# Patient Record
Sex: Male | Born: 1935 | Race: White | Hispanic: No | Smoking: Former smoker
Health system: Southern US, Community
[De-identification: ages and names within clinical notes are randomized; demographics above are authoritative.]

## PROBLEM LIST (undated history)

## (undated) DIAGNOSIS — I499 Cardiac arrhythmia, unspecified: Secondary | ICD-10-CM

## (undated) DIAGNOSIS — I214 Non-ST elevation (NSTEMI) myocardial infarction: Secondary | ICD-10-CM

## (undated) DIAGNOSIS — Z95820 Peripheral vascular angioplasty status with implants and grafts: Secondary | ICD-10-CM

## (undated) DIAGNOSIS — I2510 Atherosclerotic heart disease of native coronary artery without angina pectoris: Secondary | ICD-10-CM

## (undated) DIAGNOSIS — R011 Cardiac murmur, unspecified: Secondary | ICD-10-CM

## (undated) DIAGNOSIS — K219 Gastro-esophageal reflux disease without esophagitis: Secondary | ICD-10-CM

## (undated) DIAGNOSIS — C61 Malignant neoplasm of prostate: Secondary | ICD-10-CM

## (undated) DIAGNOSIS — I10 Essential (primary) hypertension: Secondary | ICD-10-CM

## (undated) DIAGNOSIS — I209 Angina pectoris, unspecified: Secondary | ICD-10-CM

## (undated) DIAGNOSIS — N189 Chronic kidney disease, unspecified: Secondary | ICD-10-CM

## (undated) HISTORY — PX: CATARACT EXTRACTION W/ INTRAOCULAR LENS  IMPLANT, BILATERAL: SHX1307

## (undated) HISTORY — DX: Cardiac arrhythmia, unspecified: I49.9

## (undated) HISTORY — PX: STAPEDECTOMY: SHX2435

## (undated) HISTORY — PX: CARDIAC CATHETERIZATION: SHX172

---

## 1999-09-18 HISTORY — PX: INSERTION PROSTATE RADIATION SEED: SUR718

## 2011-11-23 ENCOUNTER — Encounter (HOSPITAL_COMMUNITY): Payer: Self-pay | Admitting: Emergency Medicine

## 2011-11-23 ENCOUNTER — Emergency Department (HOSPITAL_COMMUNITY): Payer: MEDICARE

## 2011-11-23 ENCOUNTER — Inpatient Hospital Stay (HOSPITAL_COMMUNITY)
Admission: EM | Admit: 2011-11-23 | Discharge: 2011-11-28 | DRG: 249 | Disposition: A | Discharge status: Home / Self Care | Payer: MEDICARE | Source: Ambulatory Visit | Attending: Cardiology | Admitting: Cardiology

## 2011-11-23 ENCOUNTER — Other Ambulatory Visit: Payer: Self-pay

## 2011-11-23 DIAGNOSIS — E861 Hypovolemia: Secondary | ICD-10-CM | POA: Diagnosis present

## 2011-11-23 DIAGNOSIS — R079 Chest pain, unspecified: Secondary | ICD-10-CM | POA: Diagnosis present

## 2011-11-23 DIAGNOSIS — E876 Hypokalemia: Secondary | ICD-10-CM | POA: Diagnosis not present

## 2011-11-23 DIAGNOSIS — Z7982 Long term (current) use of aspirin: Secondary | ICD-10-CM

## 2011-11-23 DIAGNOSIS — I2510 Atherosclerotic heart disease of native coronary artery without angina pectoris: Secondary | ICD-10-CM | POA: Diagnosis present

## 2011-11-23 DIAGNOSIS — I4891 Unspecified atrial fibrillation: Secondary | ICD-10-CM | POA: Diagnosis present

## 2011-11-23 DIAGNOSIS — Z79899 Other long term (current) drug therapy: Secondary | ICD-10-CM

## 2011-11-23 DIAGNOSIS — Z95820 Peripheral vascular angioplasty status with implants and grafts: Secondary | ICD-10-CM

## 2011-11-23 DIAGNOSIS — I10 Essential (primary) hypertension: Secondary | ICD-10-CM | POA: Diagnosis present

## 2011-11-23 DIAGNOSIS — I214 Non-ST elevation (NSTEMI) myocardial infarction: Principal | ICD-10-CM | POA: Diagnosis present

## 2011-11-23 DIAGNOSIS — I129 Hypertensive chronic kidney disease with stage 1 through stage 4 chronic kidney disease, or unspecified chronic kidney disease: Secondary | ICD-10-CM | POA: Diagnosis present

## 2011-11-23 DIAGNOSIS — N179 Acute kidney failure, unspecified: Secondary | ICD-10-CM | POA: Diagnosis present

## 2011-11-23 DIAGNOSIS — D696 Thrombocytopenia, unspecified: Secondary | ICD-10-CM | POA: Diagnosis not present

## 2011-11-23 DIAGNOSIS — N183 Chronic kidney disease, stage 3 unspecified: Secondary | ICD-10-CM | POA: Diagnosis present

## 2011-11-23 DIAGNOSIS — K219 Gastro-esophageal reflux disease without esophagitis: Secondary | ICD-10-CM | POA: Diagnosis present

## 2011-11-23 DIAGNOSIS — I4510 Unspecified right bundle-branch block: Secondary | ICD-10-CM | POA: Diagnosis present

## 2011-11-23 HISTORY — DX: Essential (primary) hypertension: I10

## 2011-11-23 HISTORY — DX: Gastro-esophageal reflux disease without esophagitis: K21.9

## 2011-11-23 HISTORY — DX: Atherosclerotic heart disease of native coronary artery without angina pectoris: I25.10

## 2011-11-23 HISTORY — DX: Malignant neoplasm of prostate: C61

## 2011-11-23 HISTORY — DX: Angina pectoris, unspecified: I20.9

## 2011-11-23 HISTORY — DX: Non-ST elevation (NSTEMI) myocardial infarction: I21.4

## 2011-11-23 HISTORY — DX: Peripheral vascular angioplasty status with implants and grafts: Z95.820

## 2011-11-23 HISTORY — DX: Cardiac murmur, unspecified: R01.1

## 2011-11-23 LAB — CARDIAC PANEL(CRET KIN+CKTOT+MB+TROPI)
CK, MB: 7 ng/mL (ref 0.3–4.0)
Relative Index: 2.9 — ABNORMAL HIGH (ref 0.0–2.5)
Total CK: 245 U/L — ABNORMAL HIGH (ref 7–232)

## 2011-11-23 LAB — URINE MICROSCOPIC-ADD ON

## 2011-11-23 LAB — URINALYSIS, ROUTINE W REFLEX MICROSCOPIC
Bilirubin Urine: NEGATIVE
Glucose, UA: NEGATIVE mg/dL
Hgb urine dipstick: NEGATIVE
Ketones, ur: NEGATIVE mg/dL
Leukocytes, UA: NEGATIVE
Nitrite: NEGATIVE
Protein, ur: 30 mg/dL — AB
Specific Gravity, Urine: 1.025 (ref 1.005–1.030)
Urobilinogen, UA: 0.2 mg/dL (ref 0.0–1.0)
pH: 5.5 (ref 5.0–8.0)

## 2011-11-23 LAB — CBC
HCT: 45.6 % (ref 39.0–52.0)
Hemoglobin: 16.4 g/dL (ref 13.0–17.0)
MCH: 31.7 pg (ref 26.0–34.0)
MCHC: 36 g/dL (ref 30.0–36.0)
MCV: 88.2 fL (ref 78.0–100.0)
Platelets: 176 10*3/uL (ref 150–400)
RBC: 5.17 MIL/uL (ref 4.22–5.81)
RDW: 13.6 % (ref 11.5–15.5)
WBC: 13.5 10*3/uL — ABNORMAL HIGH (ref 4.0–10.5)

## 2011-11-23 LAB — BASIC METABOLIC PANEL
BUN: 42 mg/dL — ABNORMAL HIGH (ref 6–23)
CO2: 27 mEq/L (ref 19–32)
Calcium: 12.2 mg/dL — ABNORMAL HIGH (ref 8.4–10.5)
Chloride: 99 mEq/L (ref 96–112)
Creatinine, Ser: 1.93 mg/dL — ABNORMAL HIGH (ref 0.50–1.35)
GFR calc Af Amer: 37 mL/min — ABNORMAL LOW (ref >90)
GFR calc non Af Amer: 32 mL/min — ABNORMAL LOW (ref >90)
Glucose, Bld: 107 mg/dL — ABNORMAL HIGH (ref 70–99)
Potassium: 3.5 mEq/L (ref 3.5–5.1)
Sodium: 139 mEq/L (ref 135–145)

## 2011-11-23 LAB — APTT: aPTT: 22 seconds — ABNORMAL LOW (ref 24–37)

## 2011-11-23 LAB — TROPONIN I: Troponin I: 3.01 ng/mL (ref <0.30)

## 2011-11-23 LAB — PROTIME-INR: INR: 1.17 (ref 0.00–1.49)

## 2011-11-23 IMAGING — CR DG CHEST 1V PORT
1 series · 1 of 1 positions shown · non-contrast
Comparison: No priors.

CLINICAL DATA: Chest pain and irregular heart beat.

PORTABLE CHEST - 1 VIEW

[view not recorded]
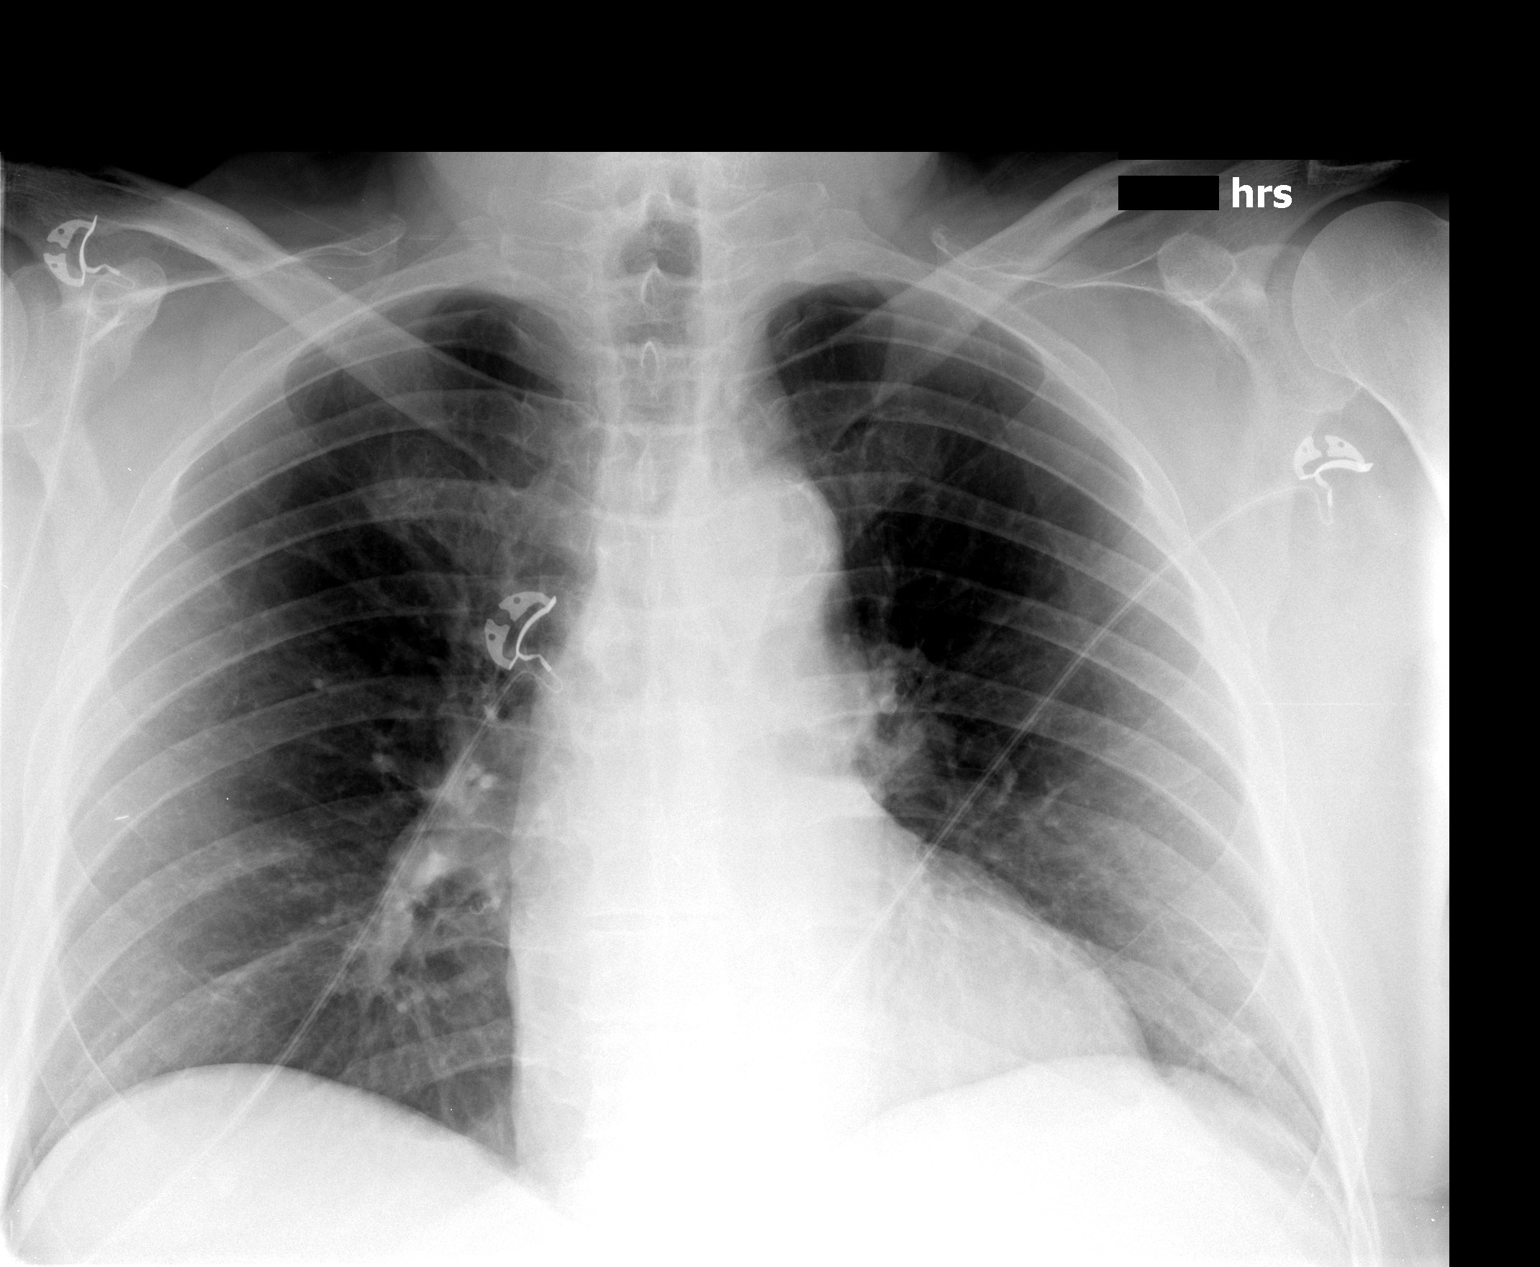

[1 of 1 positions shown; findings below may reference images not displayed]

FINDINGS: Lung volumes are normal.  No consolidative airspace
disease.  No pleural effusions.  No pneumothorax.  No pulmonary
nodule or mass noted.  Linear opacity in the lower left hemithorax
laterally is most consistent with an tiny focus of subsegmental
atelectasis and/or scarring.  Pulmonary vasculature and the
cardiomediastinal silhouette are within normal limits.
Atherosclerotic calcifications within the arch of the aorta.
IMPRESSION: 1. No radiographic evidence of acute cardiopulmonary disease.
2.  Atherosclerosis.

## 2011-11-23 MED ORDER — LOSARTAN POTASSIUM 50 MG PO TABS
100.0000 mg | ORAL_TABLET | Freq: Every day | ORAL | Status: DC
  Administered 2011-11-23 – 2011-11-24 (×2): 100 mg via ORAL
  Filled 2011-11-23 (×2): qty 2

## 2011-11-23 MED ORDER — HYDROCHLOROTHIAZIDE 25 MG PO TABS
25.0000 mg | ORAL_TABLET | Freq: Every day | ORAL | Status: DC
  Administered 2011-11-24: 25 mg via ORAL
  Filled 2011-11-23: qty 1

## 2011-11-23 MED ORDER — PANTOPRAZOLE SODIUM 40 MG PO TBEC
40.0000 mg | DELAYED_RELEASE_TABLET | Freq: Every day | ORAL | Status: DC
  Administered 2011-11-24 – 2011-11-27 (×4): 40 mg via ORAL
  Filled 2011-11-23 (×3): qty 1

## 2011-11-23 MED ORDER — HEPARIN BOLUS VIA INFUSION
4000.0000 [IU] | Freq: Once | INTRAVENOUS | Status: AC
  Administered 2011-11-23: 4000 [IU] via INTRAVENOUS

## 2011-11-23 MED ORDER — ATORVASTATIN CALCIUM 10 MG PO TABS
10.0000 mg | ORAL_TABLET | Freq: Every day | ORAL | Status: DC
  Administered 2011-11-23 – 2011-11-27 (×5): 10 mg via ORAL
  Filled 2011-11-23 (×6): qty 1

## 2011-11-23 MED ORDER — ONDANSETRON HCL 4 MG/2ML IJ SOLN: 4.0000 mg | Freq: Four times a day (QID) | INTRAMUSCULAR | Status: DC | PRN

## 2011-11-23 MED ORDER — SODIUM CHLORIDE 0.9 % IV SOLN: INTRAVENOUS | Status: AC

## 2011-11-23 MED ORDER — LISINOPRIL-HYDROCHLOROTHIAZIDE 20-25 MG PO TABS: 1.0000 | ORAL_TABLET | Freq: Every day | ORAL | Status: DC

## 2011-11-23 MED ORDER — LISINOPRIL 20 MG PO TABS
20.0000 mg | ORAL_TABLET | Freq: Every day | ORAL | Status: DC
  Administered 2011-11-24: 20 mg via ORAL
  Filled 2011-11-23: qty 1

## 2011-11-23 MED ORDER — HEPARIN (PORCINE) IN NACL 100-0.45 UNIT/ML-% IJ SOLN
1650.0000 [IU]/h | INTRAMUSCULAR | Status: DC
  Administered 2011-11-23 – 2011-11-25 (×4): 1300 [IU]/h via INTRAVENOUS
  Administered 2011-11-26: 1650 [IU]/h via INTRAVENOUS
  Filled 2011-11-23 (×3): qty 250

## 2011-11-23 MED ORDER — ASPIRIN EC 325 MG PO TBEC
325.0000 mg | DELAYED_RELEASE_TABLET | Freq: Every day | ORAL | Status: DC
  Administered 2011-11-24 – 2011-11-25 (×2): 325 mg via ORAL
  Filled 2011-11-23 (×3): qty 1

## 2011-11-23 MED ORDER — ASPIRIN EC 81 MG PO TBEC
81.0000 mg | DELAYED_RELEASE_TABLET | Freq: Every day | ORAL | Status: DC
  Filled 2011-11-23: qty 1

## 2011-11-23 MED ORDER — ISOSORBIDE MONONITRATE 15 MG HALF TABLET
15.0000 mg | ORAL_TABLET | Freq: Every day | ORAL | Status: DC
  Administered 2011-11-23 – 2011-11-28 (×6): 15 mg via ORAL
  Filled 2011-11-23 (×6): qty 1

## 2011-11-23 MED ORDER — NITROGLYCERIN 0.4 MG SL SUBL: 0.4000 mg | SUBLINGUAL_TABLET | SUBLINGUAL | Status: DC | PRN

## 2011-11-23 MED ORDER — HEPARIN (PORCINE) IN NACL 100-0.45 UNIT/ML-% IJ SOLN
1250.0000 [IU]/h | INTRAMUSCULAR | Status: DC
  Administered 2011-11-23: 1250 [IU]/h via INTRAVENOUS
  Filled 2011-11-23 (×2): qty 250

## 2011-11-23 MED ORDER — DILTIAZEM HCL 100 MG IV SOLR
5.0000 mg/h | INTRAVENOUS | Status: DC
  Administered 2011-11-24 – 2011-11-25 (×2): 5 mg/h via INTRAVENOUS
  Filled 2011-11-23 (×4): qty 100

## 2011-11-23 MED ORDER — ACETAMINOPHEN 325 MG PO TABS
650.0000 mg | ORAL_TABLET | ORAL | Status: DC | PRN
  Administered 2011-11-23: 650 mg via ORAL
  Filled 2011-11-23: qty 2

## 2011-11-23 MED ORDER — ASPIRIN EC 81 MG PO TBEC: 81.0000 mg | DELAYED_RELEASE_TABLET | Freq: Every day | ORAL | Status: DC

## 2011-11-23 MED ORDER — METOPROLOL TARTRATE 12.5 MG HALF TABLET
12.5000 mg | ORAL_TABLET | Freq: Two times a day (BID) | ORAL | Status: DC
  Administered 2011-11-23: 12.5 mg via ORAL
  Filled 2011-11-23 (×3): qty 1

## 2011-11-23 MED ORDER — METOPROLOL SUCCINATE ER 50 MG PO TB24
50.0000 mg | ORAL_TABLET | Freq: Every day | ORAL | Status: DC
  Administered 2011-11-24: 50 mg via ORAL
  Filled 2011-11-23 (×2): qty 1

## 2011-11-23 NOTE — ED Notes (Signed)
Reported troponin 3.01 Gillian Scarce, [place]

## 2011-11-23 NOTE — ED Notes (Signed)
2006-01 Ready 

## 2011-11-23 NOTE — ED Provider Notes (Signed)
History     CSN: [id number]  Arrival date & time 11/23/11  1113   First MD Initiated Contact with Patient 11/23/11 1147      Chief Complaint  Patient presents with  . Irregular Heart Beat    (Consider location/radiation/quality/duration/timing/severity/associated sxs/prior treatment) HPI Patient presents the emergency department after presenting to urgent care this morning following chest pain that he states started while he was in [place].  Patient states that he is having this burning sensation across the anterior portion of his chest.  Patient states he is having some shortness of breath with this as well.  He states that last 3 or 4 days and went away.  He is not having any current pain at this time.  He stated that he did have some mild nausea over that time frame as well.  Patient denies abdominal pain, fever, headache, visual changes, back pain, weakness, numbness, or diarrhea. Past Medical History  Diagnosis Date  . Acid reflux   . Hypertension     History reviewed. No pertinent past surgical history.  History reviewed. No pertinent family history.  History  Substance Use Topics  . Smoking status: Never Smoker   . Smokeless tobacco: Not on file  . Alcohol Use: No      Review of Systems All pertinent positives and negatives reviewed in the history of present illness  Allergies  Review of patient's allergies indicates no known allergies.  Home Medications   Current Outpatient Rx  Name Route Sig Dispense Refill  . ASPIRIN EC 81 MG PO TBEC Oral Take 81 mg by mouth daily.    Marland Kitchen LISINOPRIL-HYDROCHLOROTHIAZIDE 20-25 MG PO TABS Oral Take 1 tablet by mouth daily.    Marland Kitchen LOSARTAN POTASSIUM 100 MG PO TABS Oral Take 100 mg by mouth daily.    Marland Kitchen METOPROLOL SUCCINATE ER 50 MG PO TB24 Oral Take 50 mg by mouth daily. Take with or immediately following a meal.    . OMEPRAZOLE 20 MG PO CPDR Oral Take 20 mg by mouth daily.      BP 117/88  Pulse 89  Temp(Src) 98.3 F (36.8  C) (Oral)  Resp 15  Ht 6' (1.829 m)  Wt 220 lb (99.791 kg)  BMI 29.84 kg/m2  SpO2 96%  Physical Exam  Constitutional: He is oriented to person, place, and time. He appears well-developed and well-nourished. No distress.  HENT:  Head: Normocephalic and atraumatic.  Eyes: Pupils are equal, round, and reactive to light.  Cardiovascular: Normal heart sounds.  An irregularly irregular rhythm present. Exam reveals no gallop and no friction rub.   No murmur heard. Pulmonary/Chest: Effort normal and breath sounds normal. No respiratory distress. He has no wheezes. He has no rales.  Abdominal: Soft. Bowel sounds are normal. He exhibits no distension. There is no tenderness. There is no rebound and no guarding.  Neurological: He is alert and oriented to person, place, and time.  Skin: Skin is warm and dry. No rash noted. He is not diaphoretic.  Psychiatric: He has a normal mood and affect. His behavior is normal. Thought content normal.    ED Course  Procedures (including critical care time)  Labs Reviewed  BASIC METABOLIC PANEL - Abnormal; Notable for the following:    Glucose, Bld 107 (*)    BUN 42 (*)    Creatinine, Ser 1.93 (*)    Calcium 12.2 (*)    GFR calc non Af Amer 32 (*)    GFR calc Af Amer 37 (*)  All other components within normal limits  CBC - Abnormal; Notable for the following:    WBC 13.5 (*)    All other components within normal limits  TROPONIN I - Abnormal; Notable for the following:    Troponin I 3.01 (*)    All other components within normal limits  URINALYSIS, ROUTINE W REFLEX MICROSCOPIC  PROTIME-INR  APTT  HEPARIN LEVEL (UNFRACTIONATED)   No results found.   1. Chest pain    Patient admitted to the hospital for this chest pain and for further workup by [facility] heart and vascular   MDM   Date: 11/23/2011  Rate:118  Rhythm: atrial fibrillation  QRS Axis: normal  Intervals: normal  ST/T Wave abnormalities: nonspecific T wave changes   Conduction Disutrbances:right bundle branch block  Narrative Interpretation:   Old EKG Reviewed: none available   MDM Reviewed: nursing note and vitals Interpretation: labs, ECG and x-ray Consults: cardiology             Carlyle Dolly, PA-C 11/23/11 1422  Carlyle Dolly, PA-C 11/23/11 1423

## 2011-11-23 NOTE — Progress Notes (Signed)
ANTICOAGULATION CONSULT NOTE - Initial Consult  Pharmacy Consult for Heparin  Indication: atrial fibrillation and chest pain  No Known Allergies  Patient Measurements: Height: 6' (182.9 cm) Weight: 220 lb (99.791 kg) IBW/kg (Calculated) : 77.6  Heparin Dosing Weight: 98 kg  Vital Signs: Temp: 98.3 F (36.8 C) (03/08 1123) Temp src: Oral (03/08 1123) BP: 117/88 mmHg (03/08 1305) Pulse Rate: 89  (03/08 1305)  Labs:  Basename 11/23/11 1157  HGB 16.4  HCT 45.6  PLT 176  APTT --  LABPROT --  INR --  HEPARINUNFRC --  CREATININE 1.93*  CKTOTAL --  CKMB --  TROPONINI 3.01*   Estimated Creatinine Clearance: 39.8 ml/min (by C-G formula based on Cr of 1.93).  Medical History: Past Medical History  Diagnosis Date  . Acid reflux   . Hypertension    Assessment: 76 YOM presented with new onset afib and chest pain to start IV heparin. Patient is not on anticoagulants prior to admission. No known drug allergies per Pt.  Baseline hgb and plt wnl, troponin elevated 3.01.   Goal of Therapy:  Heparin level 0.3-0.7 units/ml   Plan:  - Start heparin bolus 4000 units, then heparin infusion 1250 units/hr - f/u heparin level 8 hours after infusion start - Daily CBC and heparin level with AM labs  Riki Rusk 11/23/2011,1:28 PM

## 2011-11-23 NOTE — H&P (Signed)
[name] [name] is an 78 y.o. male.   Chief Complaint: Atrial fibrillation, chest pain HPI: patient is a 78 year old Caucasian male who lives 3 months of the year in the [place], one month in the [place] area, the rest of the time in [place]. He has a history of hypertension gastroesophageal reflux disease and prostate surgery approximately 10 years ago. Patient states he was in the [place] on Monday when he developed a "hot, burning pressure-like" sensation in his chest.  Had associated belching, dyspnea on exertion. He states the pressure at its worst was a 7/10 in intensity.  Is currently resolved. The patient presented to urgent care today for follow up of his chest pain EKG was completed and showed it to fibrillation with RVR.  He was subsequently sent to the most the hospital for further evaluation.  He denies shortness of breath, orthopnea, PND, abdominal pain.  Past Medical History  Diagnosis Date  . Acid reflux   . Hypertension     History reviewed. No pertinent past surgical history.  History reviewed. No pertinent family history. Social History:  reports that he has never smoked. He does not have any smokeless tobacco history on file. He reports that he does not drink alcohol or use illicit drugs.  Medications: Cozaar 100 mg daily, white central HCTZ 10/25 mg daily, Toprol-XL 50 mg daily, Prilosec 20 mg daily, and vitamin E., vitamin C, aspirin 81 mg daily, Lumigan eyedrops one drop each eye daily  Allergies: No Known Allergies  No current facility-administered medications on file as of 11/23/2011.   No current outpatient prescriptions on file as of 11/23/2011.   Results for orders placed during the hospital encounter of 11/23/11 (from the past 24 hour(s))  BASIC METABOLIC PANEL     Status: Abnormal   Collection Time   11/23/11 11:57 AM      Component Value Range   Sodium 139  135 - 145 (mEq/L)   Potassium 3.5  3.5 - 5.1 (mEq/L)   Chloride 99  96 - 112 (mEq/L)   CO2 27   19 - 32 (mEq/L)   Glucose, Bld 107 (*) 70 - 99 (mg/dL)   BUN 42 (*) 6 - 23 (mg/dL)   Creatinine, Ser 1.61 (*) 0.50 - 1.35 (mg/dL)   Calcium 09.6 (*) 8.4 - 10.5 (mg/dL)   GFR calc non Af Amer 32 (*) >90 (mL/min)   GFR calc Af Amer 37 (*) >90 (mL/min)  CBC     Status: Abnormal   Collection Time   11/23/11 11:57 AM      Component Value Range   WBC 13.5 (*) 4.0 - 10.5 (K/uL)   RBC 5.17  4.22 - 5.81 (MIL/uL)   Hemoglobin 16.4  13.0 - 17.0 (g/dL)   HCT 04.5  40.9 - 81.1 (%)   MCV 88.2  78.0 - 100.0 (fL)   MCH 31.7  26.0 - 34.0 (pg)   MCHC 36.0  30.0 - 36.0 (g/dL)   RDW 91.4  78.2 - 95.6 (%)   Platelets 176  150 - 400 (K/uL)  TROPONIN I     Status: Abnormal   Collection Time   11/23/11 11:57 AM      Component Value Range   Troponin I 3.01 (*) <0.30 (ng/mL)  PROTIME-INR     Status: Normal   Collection Time   11/23/11  1:26 PM      Component Value Range   Prothrombin Time 15.1  11.6 - 15.2 (seconds)   INR 1.17  0.00 - 1.49   APTT     Status: Abnormal   Collection Time   11/23/11  1:26 PM      Component Value Range   aPTT 22 (*) 24 - 37 (seconds)  URINALYSIS, ROUTINE W REFLEX MICROSCOPIC     Status: Abnormal   Collection Time   11/23/11  1:35 PM      Component Value Range   Color, Urine AMBER (*) YELLOW    APPearance CLOUDY (*) CLEAR    Specific Gravity, Urine 1.025  1.005 - 1.030    pH 5.5  5.0 - 8.0    Glucose, UA NEGATIVE  NEGATIVE (mg/dL)   Hgb urine dipstick NEGATIVE  NEGATIVE    Bilirubin Urine NEGATIVE  NEGATIVE    Ketones, ur NEGATIVE  NEGATIVE (mg/dL)   Protein, ur 30 (*) NEGATIVE (mg/dL)   Urobilinogen, UA 0.2  0.0 - 1.0 (mg/dL)   Nitrite NEGATIVE  NEGATIVE    Leukocytes, UA NEGATIVE  NEGATIVE   URINE MICROSCOPIC-ADD ON     Status: Normal   Collection Time   11/23/11  1:35 PM      Component Value Range   Squamous Epithelial / LPF RARE  RARE    WBC, UA 0-2  <3 (WBC/hpf)   Bacteria, UA RARE  RARE    Urine-Other MUCOUS PRESENT     Results for orders placed during the  hospital encounter of 11/23/11 (from the past 48 hour(s))  CBC     Status: Abnormal   Collection Time   11/23/11 11:57 AM      Component Value Range Comment   WBC 13.5 (*) 4.0 - 10.5 (K/uL)    RBC 5.17  4.22 - 5.81 (MIL/uL)    Hemoglobin 16.4  13.0 - 17.0 (g/dL)    HCT 16.1  09.6 - 04.5 (%)    MCV 88.2  78.0 - 100.0 (fL)    MCH 31.7  26.0 - 34.0 (pg)    MCHC 36.0  30.0 - 36.0 (g/dL)    RDW 40.9  81.1 - 91.4 (%)    Platelets 176  150 - 400 (K/uL)    No results found.  Review of Systems  Constitutional: Negative for fever and diaphoresis.  HENT: Positive for sore throat (mild). Negative for congestion.   Eyes: Negative for blurred vision and double vision.  Respiratory: Positive for shortness of breath (With exertion.). Negative for cough.   Cardiovascular: Positive for chest pain ("Pressure/burning" last Monday.) and leg swelling (Occassionally in theleft leg.  Prior injury). Negative for palpitations, orthopnea, claudication and PND.  Gastrointestinal: Negative for nausea, vomiting, abdominal pain, diarrhea, constipation, blood in stool and melena.  Genitourinary: Negative for dysuria and hematuria.  Neurological: Positive for weakness. Negative for dizziness and headaches.    Blood pressure 153/106, pulse 95, temperature 98.3 F (36.8 C), temperature source Oral, resp. rate 19, SpO2 100.00%. Physical Exam  Constitutional: He is oriented to person, place, and time. He appears well-developed and well-nourished. No distress.  HENT:  Head: Normocephalic and atraumatic.  Eyes: EOM are normal. Pupils are equal, round, and reactive to light. Left eye exhibits no discharge. No scleral icterus.  Neck: Normal range of motion. Neck supple. No JVD present.  Cardiovascular: An irregularly irregular rhythm present.  Murmur heard.  Systolic murmur is present with a grade of 1/6       No carotid or femoral bruits.  Heart rate elevated  Respiratory: Effort normal and breath sounds normal. He  has no wheezes. He  has no rales.  GI: Soft. Bowel sounds are normal. He exhibits no distension. There is no tenderness.  Musculoskeletal: He exhibits no edema.  Lymphadenopathy:    He has no cervical adenopathy.  Neurological: He is alert and oriented to person, place, and time. He exhibits normal muscle tone.  Skin: Skin is warm and dry.  Psychiatric: He has a normal mood and affect.    Ekg:  Afib RVR, 118bpm, RBBB, T-Wave inversions Assessment/Plan Patient Active Hospital Problem List: New on-set A-fib (11/23/2011) Chest pain (11/23/2011) HTN (hypertension) (11/23/2011) RBBB  Plan:  Patient will be admitted to telemetry floor for CP and new onset Afib, the start of which is unknown. Start an IV heparin per pharmacy.  Cycle cardiac enzymes x3.  Ordered 2-D echocardiogram. Start IV cardizem at 5mg /hr. We'll continue beta blocker, aspirin, ARB. Consider TEE/DCCV.  HAGER,BRYAN W 11/23/2011, 12:29 PM  Patient seen and examined. Agree with assessment and plan.  Very pleasant 78 yo WM from [place], who has been in [place] for the past 3 months. 4 days ago he developed new onset chest pressure with significant burping and left neck/ear radiation.  He drove up to [place] to see his children.  Today he was found to be in AFib when evaluated at Urgent Care.  Currently he is unaware of any rhythm disturbance and ECG suggests A Fib at 118. Trop is positive for NSTEMI. Ca increased at 12.2, will checkPhos and PTH.  Will anticoagulate, treat with beta blocker, nitrates, ASA. Will obtain echo; serial ECG and enzymes, suspect he will need cardiac catherization.   Lennette Bihari, MD, [facility] 11/23/2011 4:06 PM

## 2011-11-23 NOTE — ED Notes (Signed)
Paged [facility] to see pt

## 2011-11-23 NOTE — Progress Notes (Signed)
ANTICOAGULATION CONSULT NOTE - FOLLOW UP  Pharmacy Consult:  Heparin  Indication: atrial fibrillation and chest pain  HL = 0.35 (goal 0.3 - 0.7 units/mL) Heparin weight = 98kg  Assessment: 78 YOM on heparin gtt for Afib and chest pain.  Heparin level therapeutic, but toward low end of goal.  No bleeding noted.  Plan: - Increase heparin gtt slightly to 1300 units/hr - F/U AM labs   Phillips Climes, PharmD 11/23/2011,10:08 PM

## 2011-11-23 NOTE — ED Provider Notes (Signed)
Medical screening examination/treatment/procedure(s) were performed by non-physician practitioner and as supervising physician I was immediately available for consultation/collaboration.   Gwyneth Sprout, MD 11/23/11 1504

## 2011-11-23 NOTE — ED Notes (Signed)
Pt c/o CP and burning on Monday while was out of town; pt returned and went to [facility] and sent here for new onset afib

## 2011-11-23 NOTE — ED Notes (Signed)
Family at bedside.pt in gown and on cardiac monitor.

## 2011-11-24 ENCOUNTER — Other Ambulatory Visit: Payer: Self-pay

## 2011-11-24 ENCOUNTER — Encounter (HOSPITAL_COMMUNITY): Payer: Self-pay | Admitting: Cardiology

## 2011-11-24 DIAGNOSIS — I214 Non-ST elevation (NSTEMI) myocardial infarction: Secondary | ICD-10-CM

## 2011-11-24 HISTORY — DX: Non-ST elevation (NSTEMI) myocardial infarction: I21.4

## 2011-11-24 LAB — LIPID PANEL
Cholesterol: 146 mg/dL (ref 0–200)
Total CHOL/HDL Ratio: 4.7 RATIO
Triglycerides: 156 mg/dL — ABNORMAL HIGH (ref <150)
VLDL: 31 mg/dL (ref 0–40)

## 2011-11-24 LAB — BASIC METABOLIC PANEL
CO2: 23 mEq/L (ref 19–32)
Chloride: 104 mEq/L (ref 96–112)
Potassium: 3.3 mEq/L — ABNORMAL LOW (ref 3.5–5.1)
Sodium: 138 mEq/L (ref 135–145)

## 2011-11-24 LAB — CBC
Platelets: 129 10*3/uL — ABNORMAL LOW (ref 150–400)
RBC: 4.68 MIL/uL (ref 4.22–5.81)
WBC: 10.1 10*3/uL (ref 4.0–10.5)

## 2011-11-24 LAB — TSH: TSH: 1.778 u[IU]/mL (ref 0.350–4.500)

## 2011-11-24 LAB — CARDIAC PANEL(CRET KIN+CKTOT+MB+TROPI)
Relative Index: 3.4 — ABNORMAL HIGH (ref 0.0–2.5)
Total CK: 176 U/L (ref 7–232)

## 2011-11-24 MED ORDER — MAGNESIUM OXIDE 400 MG PO TABS
400.0000 mg | ORAL_TABLET | Freq: Two times a day (BID) | ORAL | Status: DC
  Administered 2011-11-24 (×2): 400 mg via ORAL
  Filled 2011-11-24 (×4): qty 1

## 2011-11-24 MED ORDER — POTASSIUM CHLORIDE CRYS ER 20 MEQ PO TBCR
40.0000 meq | EXTENDED_RELEASE_TABLET | Freq: Once | ORAL | Status: AC
  Administered 2011-11-24: 40 meq via ORAL
  Filled 2011-11-24: qty 2

## 2011-11-24 MED ORDER — OFF THE BEAT BOOK
Freq: Once | Status: AC
  Administered 2011-11-24: 20:00:00
  Filled 2011-11-24: qty 1

## 2011-11-24 MED ORDER — BIMATOPROST 0.01 % OP SOLN
1.0000 [drp] | Freq: Every day | OPHTHALMIC | Status: DC
  Administered 2011-11-24 – 2011-11-25 (×2): 1 [drp] via OPHTHALMIC
  Filled 2011-11-24: qty 2.5

## 2011-11-24 MED ORDER — SODIUM CHLORIDE 0.9 % IJ SOLN
3.0000 mL | Freq: Two times a day (BID) | INTRAMUSCULAR | Status: DC
  Administered 2011-11-24 – 2011-11-25 (×3): 3 mL via INTRAVENOUS

## 2011-11-24 MED ORDER — POTASSIUM CHLORIDE CRYS ER 20 MEQ PO TBCR
20.0000 meq | EXTENDED_RELEASE_TABLET | Freq: Once | ORAL | Status: AC
  Administered 2011-11-24: 20 meq via ORAL
  Filled 2011-11-24: qty 1

## 2011-11-24 NOTE — Progress Notes (Signed)
Subjective: No chest pain.  Objective: Vital signs in last 24 hours: Temp:  [97.6 F (36.4 C)-98.3 F (36.8 C)] 97.6 F (36.4 C) (03/09 0453) Pulse Rate:  [77-104] 90  (03/09 0453) Resp:  [15-20] 20  (03/09 0453) BP: (117-157)/(77-106) 118/77 mmHg (03/09 0453) SpO2:  [94 %-100 %] 94 % (03/09 0453) Weight:  [99.791 kg (220 lb)-101.833 kg (224 lb 8 oz)] 101.833 kg (224 lb 8 oz) (03/08 1600) Weight change:  Last BM Date: 11/23/11 Intake/Output from previous day: + 240   Intake/Output this shift: Total I/O In: 240 [P.O.:240] Out: -   PE: General:Alert, oriented white male, Pleasant affect. Heart:Irreg irreg Lungs:clear without rales rhonchi or wheezes Abd:+ BS, soft non tender Ext:no edema, 2+ pedal pulses Neuro:A&O X 3, MAE, follows commands.   Lab Results:  Basename 11/24/11 0530 11/23/11 1157  WBC 10.1 13.5*  HGB 14.4 16.4  HCT 41.2 45.6  PLT 129* 176   BMET  Basename 11/24/11 0530 11/23/11 1157  NA 138 139  K 3.3* 3.5  CL 104 99  CO2 23 27  GLUCOSE 110* 107*  BUN 39* 42*  CREATININE 1.57* 1.93*  CALCIUM 11.0* 12.2*    Basename 11/24/11 0257 11/23/11 2050  TROPONINI 4.44* 5.42*    Lab Results  Component Value Date   CHOL 146 11/24/2011   HDL 31* 11/24/2011   LDLCALC 84 11/24/2011   TRIG 156* 11/24/2011   CHOLHDL 4.7 11/24/2011   No results found for this basename: HGBA1C     Lab Results  Component Value Date   TSH 1.778 11/23/2011    Hepatic Function Panel No results found for this basename: PROT,ALBUMIN,AST,ALT,ALKPHOS,BILITOT,BILIDIR,IBILI in the last 72 hours  Basename 11/24/11 0530  CHOL 146   No results found for this basename: PROTIME in the last 72 hours    EKG: Orders placed during the hospital encounter of 11/23/11  . EKG 12-LEAD  . EKG 12-LEAD  . EKG 12-LEAD    Studies/Results: Dg Chest Portable 1 View  11/23/2011  *RADIOLOGY REPORT*  Clinical Data: Chest pain and irregular heart beat.  PORTABLE CHEST - 1 VIEW  Comparison: No priors.   Findings: Lung volumes are normal.  No consolidative airspace disease.  No pleural effusions.  No pneumothorax.  No pulmonary nodule or mass noted.  Linear opacity in the lower left hemithorax laterally is most consistent with an tiny focus of subsegmental atelectasis and/or scarring.  Pulmonary vasculature and the cardiomediastinal silhouette are within normal limits. Atherosclerotic calcifications within the arch of the aorta.  IMPRESSION: 1. No radiographic evidence of acute cardiopulmonary disease. 2.  Atherosclerosis.  Original Report Authenticated By: Florencia Reasons, M.D.    Medications: I have reviewed the patient's current medications.    Marland Kitchen aspirin EC  325 mg Oral Daily  . atorvastatin  10 mg Oral q1800  . heparin  4,000 Units Intravenous Once  . hydrochlorothiazide  25 mg Oral Daily  . isosorbide mononitrate  15 mg Oral Daily  . lisinopril  20 mg Oral Daily  . losartan  100 mg Oral Daily  . metoprolol succinate  50 mg Oral Daily  . metoprolol tartrate  12.5 mg Oral BID  . pantoprazole  40 mg Oral Q1200  . DISCONTD: aspirin EC  81 mg Oral Daily  . DISCONTD: aspirin EC  81 mg Oral Daily  . DISCONTD: lisinopril-hydrochlorothiazide  1 tablet Oral Daily   Assessment/Plan: Patient Active Problem List  Diagnoses  . Acid reflux  . Chest pain  .  HTN (hypertension)  . A-fib, New onset  . NSTEMI (non-ST elevated myocardial infarction) most likely as outpatient  . CKD (chronic kidney disease) stage 3, GFR 30-59 ml/min  Now with hypokalemia Also thrombocytopenia  PLAN:  Elevated calcium  PTH pending, Phos mildly decreased at 2.2   Replace K+, also give extra magnesium for 2 days.  Cr. Improved down to 1.57 from 1.93 Peak trop. 5.42 last pm now down to 4.44,  Pk ckmb 245/7.0 last pm, though do not have one on admit. VS stable, IV dilt at 5 mg/hr, IV heparin at 1300 u Was and is on Toprol 50 mg daily, he is also on lopressor 12.5 BID.  Will d/c lopressor. Continues in rate  controlled a. Fib with RBBB. Plan cardiac cath on Monday. Hydrate beginning early Sunday evening. If Plts continue to drop will plan HIT panel. On both arb and Ace, with elevated cr. And need for cath will hold ace. 2D echo done results pending.     LOS: 1 day   INGOLD,LAURA R 11/24/2011, 9:47 AM  I have seen and examined the patient along with [facility] R, NP.  I have reviewed the chart, notes and new data.  I agree with NP's note.  Key new complaints: asymptomatic at rest Key examination changes: no ventricular arrhythmia or clinical signs of CHF; good AF rate control. Key new findings / data: cTnI drifting down, c/w event that occurred about 4 days prior to admission.  PLAN: Continue hydration for acute renal failure (likely due to hypovolemia due to reduced oral intake) prior to coronary angiography and revascularization on Monday. Risks and benefits were discussed in detail and he agrees to proceed. Discontinue HCTZ. Hold ARB before cath.  Thurmon Fair, MD, [facility] [phone] 11/24/2011, 11:31 AM

## 2011-11-24 NOTE — Progress Notes (Signed)
ANTICOAGULATION CONSULT NOTE - Follow Up Consult  Pharmacy Consult for Heparin Indication: atrial fibrillation-new onset and NSTEMI  No Known Allergies  Patient Measurements: Height: 6' (182.9 cm) Weight: 224 lb 8 oz (101.833 kg) IBW/kg (Calculated) : 77.6  Heparin Dosing Weight: 98kg Vital Signs: Temp: 97.6 F (36.4 C) (03/09 0453) Temp src: Oral (03/09 0453) BP: 116/74 mmHg (03/09 1038) Pulse Rate: 88  (03/09 1038)  Labs:  Basename 11/24/11 0926 11/24/11 0530 11/24/11 0257 11/23/11 2051 11/23/11 2050 11/23/11 1326 11/23/11 1157  HGB -- 14.4 -- -- -- -- 16.4  HCT -- 41.2 -- -- -- -- 45.6  PLT -- 129* -- -- -- -- 176  APTT -- -- -- -- -- 22* --  LABPROT -- -- -- -- -- 15.1 --  INR -- -- -- -- -- 1.17 --  HEPARINUNFRC -- 0.53 -- 0.35 -- -- --  CREATININE -- 1.57* -- -- -- -- 1.93*  CKTOTAL 143 -- 176 -- 245* -- --  CKMB 4.8* -- 5.5* -- 7.0* -- --  TROPONINI 4.11* -- 4.44* -- 5.42* -- --   Estimated Creatinine Clearance: 49.4 ml/min (by C-G formula based on Cr of 1.57).   Medications:  Scheduled:    . aspirin EC  325 mg Oral Daily  . atorvastatin  10 mg Oral q1800  . bimatoprost  1 drop Both Eyes QHS  . heparin  4,000 Units Intravenous Once  . isosorbide mononitrate  15 mg Oral Daily  . magnesium oxide  400 mg Oral BID  . metoprolol succinate  50 mg Oral Daily  . pantoprazole  40 mg Oral Q1200  . potassium chloride  20 mEq Oral Once  . potassium chloride  40 mEq Oral Once  . DISCONTD: aspirin EC  81 mg Oral Daily  . DISCONTD: aspirin EC  81 mg Oral Daily  . DISCONTD: hydrochlorothiazide  25 mg Oral Daily  . DISCONTD: lisinopril  20 mg Oral Daily  . DISCONTD: lisinopril-hydrochlorothiazide  1 tablet Oral Daily  . DISCONTD: losartan  100 mg Oral Daily  . DISCONTD: metoprolol tartrate  12.5 mg Oral BID    Assessment: 78 yo M presented 11/23/11 with NSTEMI and new onset AFib started on heparin. Heparin level therapeutic. No bleeding noted.  Drop in Pltc from  baseline noted.   Goal of Therapy:  Heparin level 0.3-0.7 units/ml   Plan:  1) Continue heparin infusion rate 1300 units/hr and Continue platelet monitoring per protocol 2) F/u daily heparin labs  Elson Clan 11/24/2011,11:59 AM

## 2011-11-24 NOTE — Progress Notes (Signed)
  Echocardiogram 2D Echocardiogram has been performed.  [name] [name] F 11/24/2011, 10:04 AM

## 2011-11-25 ENCOUNTER — Other Ambulatory Visit: Payer: Self-pay

## 2011-11-25 LAB — CBC
HCT: 38.9 % — ABNORMAL LOW (ref 39.0–52.0)
MCH: 30.9 pg (ref 26.0–34.0)
MCV: 87.2 fL (ref 78.0–100.0)
Platelets: 147 10*3/uL — ABNORMAL LOW (ref 150–400)
RDW: 13.3 % (ref 11.5–15.5)
WBC: 9.6 10*3/uL (ref 4.0–10.5)

## 2011-11-25 LAB — BASIC METABOLIC PANEL
BUN: 29 mg/dL — ABNORMAL HIGH (ref 6–23)
CO2: 23 mEq/L (ref 19–32)
Calcium: 9.8 mg/dL (ref 8.4–10.5)
Chloride: 103 mEq/L (ref 96–112)
Creatinine, Ser: 1.5 mg/dL — ABNORMAL HIGH (ref 0.50–1.35)
GFR calc Af Amer: 50 mL/min — ABNORMAL LOW (ref >90)

## 2011-11-25 MED ORDER — POTASSIUM CHLORIDE CRYS ER 20 MEQ PO TBCR
EXTENDED_RELEASE_TABLET | ORAL | Status: AC
  Filled 2011-11-25: qty 1

## 2011-11-25 MED ORDER — SODIUM CHLORIDE 0.9 % IJ SOLN: 3.0000 mL | Freq: Two times a day (BID) | INTRAMUSCULAR | Status: DC

## 2011-11-25 MED ORDER — SODIUM CHLORIDE 0.9 % IV SOLN
1.0000 mL/kg/h | INTRAVENOUS | Status: DC
  Administered 2011-11-25: 1 mL/kg/h via INTRAVENOUS

## 2011-11-25 MED ORDER — SODIUM CHLORIDE 0.9 % IV SOLN: 250.0000 mL | INTRAVENOUS | Status: DC | PRN

## 2011-11-25 MED ORDER — MAGNESIUM OXIDE 400 MG PO TABS
400.0000 mg | ORAL_TABLET | Freq: Two times a day (BID) | ORAL | Status: AC
  Administered 2011-11-25 – 2011-11-26 (×4): 400 mg via ORAL
  Filled 2011-11-25 (×4): qty 1

## 2011-11-25 MED ORDER — SODIUM CHLORIDE 0.9 % IJ SOLN: 3.0000 mL | INTRAMUSCULAR | Status: DC | PRN

## 2011-11-25 MED ORDER — ASPIRIN EC 325 MG PO TBEC: 325.0000 mg | DELAYED_RELEASE_TABLET | Freq: Every day | ORAL | Status: DC

## 2011-11-25 MED ORDER — ASPIRIN 81 MG PO CHEW
324.0000 mg | CHEWABLE_TABLET | ORAL | Status: AC
  Administered 2011-11-26: 324 mg via ORAL
  Filled 2011-11-25: qty 4

## 2011-11-25 MED ORDER — MAGNESIUM OXIDE 400 MG PO TABS: 400.0000 mg | ORAL_TABLET | Freq: Two times a day (BID) | ORAL | Status: DC

## 2011-11-25 MED ORDER — POTASSIUM CHLORIDE CRYS ER 20 MEQ PO TBCR
20.0000 meq | EXTENDED_RELEASE_TABLET | Freq: Three times a day (TID) | ORAL | Status: AC
  Administered 2011-11-25 (×3): 20 meq via ORAL
  Filled 2011-11-25 (×2): qty 1

## 2011-11-25 MED ORDER — METOPROLOL SUCCINATE ER 100 MG PO TB24
100.0000 mg | ORAL_TABLET | Freq: Every day | ORAL | Status: DC
  Administered 2011-11-25 – 2011-11-26 (×2): 100 mg via ORAL
  Filled 2011-11-25 (×3): qty 1

## 2011-11-25 NOTE — Progress Notes (Signed)
Subjective: No chest pain. No dyspnea Echo shows findings compatible with inferior wall NSTEMI and mild systolic dysfunction.  Objective: Vital signs in last 24 hours: Temp:  [97.1 F (36.2 C)-97.3 F (36.3 C)] 97.2 F (36.2 C) (03/10 0447) Pulse Rate:  [71-101] 101  (03/10 0447) Resp:  [18-20] 18  (03/10 0447) BP: (116-136)/(74-86) 124/82 mmHg (03/10 0447) SpO2:  [95 %-96 %] 96 % (03/10 0447) Weight change:  Last BM Date: 11/24/11 Intake/Output from previous day: + 240 03/09 0701 - 03/10 0700 In: 1260.6 [P.O.:600; I.V.:660.6] Out: -  Intake/Output this shift: Total I/O In: 240 [P.O.:240] Out: -   PE: General:Alert, oriented white male, Pleasant affect. Heart:Irreg irreg Lungs:clear without rales rhonchi or wheezes Abd:+ BS, soft non tender Ext:no edema, 2+ pedal pulses Neuro:A&O X 3, MAE, follows commands.   Lab Results:  Basename 11/25/11 0530 11/24/11 0530  WBC 9.6 10.1  HGB 13.8 14.4  HCT 38.9* 41.2  PLT 147* 129*   BMET  Basename 11/25/11 0530 11/24/11 0530  NA 138 138  K 3.1* 3.3*  CL 103 104  CO2 23 23  GLUCOSE 108* 110*  BUN 29* 39*  CREATININE 1.50* 1.57*  CALCIUM 9.8 11.0*    Basename 11/24/11 0926 11/24/11 0257  TROPONINI 4.11* 4.44*    Lab Results  Component Value Date   CHOL 146 11/24/2011   HDL 31* 11/24/2011   LDLCALC 84 11/24/2011   TRIG 156* 11/24/2011   CHOLHDL 4.7 11/24/2011   No results found for this basename: HGBA1C     Lab Results  Component Value Date   TSH 1.778 11/23/2011    Hepatic Function Panel No results found for this basename: PROT,ALBUMIN,AST,ALT,ALKPHOS,BILITOT,BILIDIR,IBILI in the last 72 hours  Basename 11/24/11 0530  CHOL 146   No results found for this basename: PROTIME in the last 72 hours    EKG: Orders placed during the hospital encounter of 11/23/11  . EKG 12-LEAD  . EKG 12-LEAD  . EKG 12-LEAD  . EKG 12-LEAD    Studies/Results: Dg Chest Portable 1 View  11/23/2011  *RADIOLOGY REPORT*  Clinical Data:  Chest pain and irregular heart beat.  PORTABLE CHEST - 1 VIEW  Comparison: No priors.  Findings: Lung volumes are normal.  No consolidative airspace disease.  No pleural effusions.  No pneumothorax.  No pulmonary nodule or mass noted.  Linear opacity in the lower left hemithorax laterally is most consistent with an tiny focus of subsegmental atelectasis and/or scarring.  Pulmonary vasculature and the cardiomediastinal silhouette are within normal limits. Atherosclerotic calcifications within the arch of the aorta.  IMPRESSION: 1. No radiographic evidence of acute cardiopulmonary disease. 2.  Atherosclerosis.  Original Report Authenticated By: Florencia Reasons, M.D.    Medications: I have reviewed the patient's current medications.    Marland Kitchen aspirin EC  325 mg Oral Daily  . atorvastatin  10 mg Oral q1800  . bimatoprost  1 drop Both Eyes QHS  . isosorbide mononitrate  15 mg Oral Daily  . magnesium oxide  400 mg Oral BID  . metoprolol succinate  50 mg Oral Daily  . off the beat book   Does not apply Once  . pantoprazole  40 mg Oral Q1200  . potassium chloride  20 mEq Oral Once  . potassium chloride  40 mEq Oral Once  . sodium chloride  3 mL Intravenous Q12H  . DISCONTD: hydrochlorothiazide  25 mg Oral Daily  . DISCONTD: lisinopril  20 mg Oral Daily  . DISCONTD: losartan  100 mg Oral Daily  . DISCONTD: metoprolol tartrate  12.5 mg Oral BID   Assessment/Plan: Patient Active Problem List  Diagnoses  . Acid reflux  . Chest pain  . HTN (hypertension)  . A-fib, New onset  . NSTEMI (non-ST elevated myocardial infarction) most likely as outpatient  . CKD (chronic kidney disease) stage 3, GFR 30-59 ml/min  Now with hypokalemia   LOS: 2 days   Key new complaints: asymptomatic at rest Key examination changes: no ventricular arrhythmia or clinical signs of CHF; good AF rate control. Key new findings / data: cTnI drifting down, c/w event that occurred about 4 days prior to admission. K low.  Platelet count now virtually normal.  PLAN: Continue hydration for acute renal failure and K replacement(likely due to hypovolemia due to reduced oral intake and continued HCTZ) prior to coronary angiography and revascularization on Monday. Risks and benefits were discussed in detail and he agrees to proceed. Hold ACEi/ARB before cath.  Decision to start oral anticoagulants for AF will depend on cath findings and recommended revascularization strategy.  Thurmon Fair, MD, [facility] [phone] 11/25/2011, 8:38 AM

## 2011-11-25 NOTE — Progress Notes (Signed)
ANTICOAGULATION CONSULT NOTE - Follow Up Consult  Pharmacy Consult for Heparin Indication: atrial fibrillation-new onset and NSTEMI  No Known Allergies  Patient Measurements: Height: 6' (182.9 cm) Weight: 224 lb 8 oz (101.833 kg) IBW/kg (Calculated) : 77.6  Heparin Dosing Weight: 98kg Vital Signs: Temp: 97.2 F (36.2 C) (03/10 0447) Temp src: Oral (03/10 0447) BP: 142/81 mmHg (03/10 1029) Pulse Rate: 89  (03/10 1029)  Labs:  Basename 11/25/11 0530 11/24/11 0926 11/24/11 0530 11/24/11 0257 11/23/11 2051 11/23/11 2050 11/23/11 1326 11/23/11 1157  HGB 13.8 -- 14.4 -- -- -- -- --  HCT 38.9* -- 41.2 -- -- -- -- 45.6  PLT 147* -- 129* -- -- -- -- 176  APTT -- -- -- -- -- -- 22* --  LABPROT -- -- -- -- -- -- 15.1 --  INR -- -- -- -- -- -- 1.17 --  HEPARINUNFRC 0.39 -- 0.53 -- 0.35 -- -- --  CREATININE 1.50* -- 1.57* -- -- -- -- 1.93*  CKTOTAL -- 143 -- 176 -- 245* -- --  CKMB -- 4.8* -- 5.5* -- 7.0* -- --  TROPONINI -- 4.11* -- 4.44* -- 5.42* -- --   Estimated Creatinine Clearance: 51.7 ml/min (by C-G formula based on Cr of 1.5).   Medications:  Scheduled:     . aspirin  324 mg Oral Pre-Cath  . aspirin EC  325 mg Oral Daily  . atorvastatin  10 mg Oral q1800  . bimatoprost  1 drop Both Eyes QHS  . isosorbide mononitrate  15 mg Oral Daily  . magnesium oxide  400 mg Oral BID  . metoprolol succinate  100 mg Oral Daily  . off the beat book   Does not apply Once  . pantoprazole  40 mg Oral Q1200  . potassium chloride  20 mEq Oral Once  . potassium chloride  20 mEq Oral TID  . sodium chloride  3 mL Intravenous Q12H  . sodium chloride  3 mL Intravenous Q12H  . DISCONTD: aspirin EC  325 mg Oral Daily  . DISCONTD: magnesium oxide  400 mg Oral BID  . DISCONTD: magnesium oxide  400 mg Oral BID  . DISCONTD: metoprolol succinate  50 mg Oral Daily    Assessment: 78 yo M presented 11/23/11 with NSTEMI and new onset AFib started on heparin. Heparin level therapeutic. No bleeding  noted.  Pltc improved.   Goal of Therapy:  Heparin level 0.3-0.7 units/ml   Plan:  1) Continue heparin infusion rate 1300 units/hr and Continue platelet monitoring per protocol 2) F/u daily heparin labs 3) F/U long term anticoagulation plan post-cath  Elson Clan 11/25/2011,12:13 PM

## 2011-11-26 ENCOUNTER — Other Ambulatory Visit: Payer: Self-pay

## 2011-11-26 ENCOUNTER — Encounter (HOSPITAL_COMMUNITY)
Admission: EM | Disposition: A | Discharge status: Home / Self Care | Payer: Self-pay | Source: Ambulatory Visit | Attending: Cardiology

## 2011-11-26 HISTORY — PX: LEFT HEART CATHETERIZATION WITH CORONARY ANGIOGRAM: SHX5451

## 2011-11-26 LAB — BASIC METABOLIC PANEL
Calcium: 9.7 mg/dL (ref 8.4–10.5)
GFR calc Af Amer: 55 mL/min — ABNORMAL LOW (ref >90)
GFR calc non Af Amer: 48 mL/min — ABNORMAL LOW (ref >90)
Sodium: 140 mEq/L (ref 135–145)

## 2011-11-26 LAB — POCT ACTIVATED CLOTTING TIME: Activated Clotting Time: 468 seconds

## 2011-11-26 LAB — CBC
MCH: 30.6 pg (ref 26.0–34.0)
MCHC: 34.9 g/dL (ref 30.0–36.0)
Platelets: 160 10*3/uL (ref 150–400)

## 2011-11-26 LAB — PTH, INTACT AND CALCIUM
Calcium, Total (PTH): 11.2 mg/dL — ABNORMAL HIGH (ref 8.4–10.5)
PTH: 74.9 pg/mL — ABNORMAL HIGH (ref 14.0–72.0)

## 2011-11-26 LAB — HEPARIN LEVEL (UNFRACTIONATED): Heparin Unfractionated: 0.19 IU/mL — ABNORMAL LOW (ref 0.30–0.70)

## 2011-11-26 SURGERY — LEFT HEART CATHETERIZATION WITH CORONARY ANGIOGRAM
Anesthesia: LOCAL

## 2011-11-26 MED ORDER — VERAPAMIL HCL 2.5 MG/ML IV SOLN
INTRAVENOUS | Status: AC
  Filled 2011-11-26: qty 2

## 2011-11-26 MED ORDER — MIDAZOLAM HCL 2 MG/2ML IJ SOLN
INTRAMUSCULAR | Status: AC
  Filled 2011-11-26: qty 2

## 2011-11-26 MED ORDER — CLOPIDOGREL BISULFATE 75 MG PO TABS
75.0000 mg | ORAL_TABLET | Freq: Every day | ORAL | Status: DC
  Administered 2011-11-27 – 2011-11-28 (×2): 75 mg via ORAL
  Filled 2011-11-26 (×2): qty 1

## 2011-11-26 MED ORDER — RIVAROXABAN 10 MG PO TABS
20.0000 mg | ORAL_TABLET | Freq: Every day | ORAL | Status: DC
  Administered 2011-11-27 – 2011-11-28 (×2): 20 mg via ORAL
  Filled 2011-11-26 (×2): qty 2

## 2011-11-26 MED ORDER — HEPARIN (PORCINE) IN NACL 2-0.9 UNIT/ML-% IJ SOLN
INTRAMUSCULAR | Status: AC
  Filled 2011-11-26: qty 2000

## 2011-11-26 MED ORDER — SODIUM CHLORIDE 0.9 % IV SOLN: 250.0000 mL | INTRAVENOUS | Status: DC

## 2011-11-26 MED ORDER — NITROGLYCERIN 0.2 MG/ML ON CALL CATH LAB
INTRAVENOUS | Status: AC
  Filled 2011-11-26: qty 1

## 2011-11-26 MED ORDER — HEPARIN SODIUM (PORCINE) 1000 UNIT/ML IJ SOLN
INTRAMUSCULAR | Status: AC
  Filled 2011-11-26: qty 1

## 2011-11-26 MED ORDER — SODIUM CHLORIDE 0.9 % IJ SOLN
3.0000 mL | Freq: Two times a day (BID) | INTRAMUSCULAR | Status: DC
  Administered 2011-11-26 – 2011-11-27 (×3): 3 mL via INTRAVENOUS

## 2011-11-26 MED ORDER — LIDOCAINE HCL (PF) 1 % IJ SOLN
INTRAMUSCULAR | Status: AC
  Filled 2011-11-26: qty 30

## 2011-11-26 MED ORDER — FENTANYL CITRATE 0.05 MG/ML IJ SOLN
INTRAMUSCULAR | Status: AC
  Filled 2011-11-26: qty 2

## 2011-11-26 MED ORDER — HEPARIN BOLUS VIA INFUSION
1000.0000 [IU] | Freq: Once | INTRAVENOUS | Status: AC
  Administered 2011-11-26: 1000 [IU] via INTRAVENOUS
  Filled 2011-11-26: qty 1000

## 2011-11-26 MED ORDER — BIVALIRUDIN 250 MG IV SOLR
INTRAVENOUS | Status: AC
  Filled 2011-11-26: qty 250

## 2011-11-26 MED ORDER — ASPIRIN EC 325 MG PO TBEC
325.0000 mg | DELAYED_RELEASE_TABLET | Freq: Every day | ORAL | Status: DC
  Administered 2011-11-27: 325 mg via ORAL
  Filled 2011-11-26: qty 1

## 2011-11-26 MED ORDER — ONDANSETRON HCL 4 MG/2ML IJ SOLN: 4.0000 mg | Freq: Four times a day (QID) | INTRAMUSCULAR | Status: DC | PRN

## 2011-11-26 MED ORDER — SODIUM CHLORIDE 0.9 % IJ SOLN: 3.0000 mL | INTRAMUSCULAR | Status: DC | PRN

## 2011-11-26 MED ORDER — MORPHINE SULFATE 2 MG/ML IJ SOLN: 1.0000 mg | INTRAMUSCULAR | Status: DC | PRN

## 2011-11-26 MED ORDER — SODIUM CHLORIDE 0.9 % IV SOLN
1.0000 mL/kg/h | INTRAVENOUS | Status: AC
  Administered 2011-11-26: 1 mL/kg/h via INTRAVENOUS

## 2011-11-26 MED ORDER — CLOPIDOGREL BISULFATE 300 MG PO TABS
ORAL_TABLET | ORAL | Status: AC
  Filled 2011-11-26: qty 2

## 2011-11-26 MED ORDER — ACETAMINOPHEN 325 MG PO TABS
650.0000 mg | ORAL_TABLET | ORAL | Status: DC | PRN
Start: 2011-11-26 — End: 2011-11-28
  Administered 2011-11-26 – 2011-11-27 (×3): 650 mg via ORAL
  Filled 2011-11-26 (×3): qty 2

## 2011-11-26 NOTE — Progress Notes (Signed)
TR BAND REMOVAL  LOCATION:  right radial  DEFLATED PER PROTOCOL:  yes  TIME BAND OFF / DRESSING APPLIED:   1510   SITE UPON ARRIVAL:   Level 0  SITE AFTER BAND REMOVAL:  Level 0  REVERSE ALLEN'S TEST:    positive  CIRCULATION SENSATION AND MOVEMENT:  Within Normal Limits  yes  COMMENTS:

## 2011-11-26 NOTE — CV Procedure (Signed)
CARDIAC CATHETERIZATION REPORT   Procedures performed:  1. Left heart catheterization  2. Selective coronary angiography   Reason for procedure:  Acute Non ST segment elevation myocardial infarction   Procedure performed by: Thurmon Fair, MD, [facility]  Complications: none   Estimated blood loss: less than 5 mL   History:  NSTEMI. New onset AFib.  Consent: The risks, benefits, and details of the procedure were explained to the patient. Risks including death, MI, stroke, bleeding, limb ischemia, renal failure and allergy were described and accepted by the patient. Informed written consent was obtained prior to proceeding.  Technique: The patient was brought to the cardiac catheterization laboratory in the fasting state. He was prepped and draped in the usual sterile fashion. Local anesthesia with 1% lidocaine was administered to the right wrist area. Using the modified Seldinger technique a 5 French right radial artery Glide sheath was introduced without difficulty. Under fluoroscopic guidance, using 5 [place] TIG and JR catheters, selective cannulation of the left coronary artery, right coronary artery and left ventricle were respectively performed. Several coronary angiograms in a variety of projections were recorded, but a left ventriculogram was not performed. Left ventricular pressure and a pull back to the aorta were recorded. No immediate complications occurred.   Contrast used: 55 mL Omnipaque  Angiographic Findings:  1. The left main coronary artery is free of significant atherosclerosis and bifurcates in the usual fashion into the left anterior descending artery and left circumflex coronary artery.  2. The left anterior descending artery is a large vessel that reaches the apex and generates  Two major diagonal branches. The first diagonal is relatively small and has a smooth 80% stenosis. The second diagonal is very large and serves as an accessory LAD. Mild luminal irregularities, but  no significant stenoses are seen in the LAD or second diagonal arteries. 3. The left circumflex coronary artery is a large-size vessel non dominant vessel that generates three major oblque marginal arteries, the first of which is diminutive. There is evidence of mild luminal irregularities and minimal calcification. There is 60-70% ostial stenosis in the second OM and there is subtotal (>95%) stenosis in the left circumflex artery just beyond the OM2. This latter lesion has the features of a culprit stenosis and fits the inferior wall distribution on the echocardiogram. 4. The right coronary artery is a large-size dominant vessel that generates a very small posterior lateral ventricular system as well as a very large and long posterior descending artery. PDA.  5. The left ventricle was not injected, but the aortic valve was crossed with the JR catheter. There is no aortic valve stenosis by pullback. The left ventricular end-diastolic pressure is 19 mm Hg.    IMPRESSIONS:  NSTEMI due to distal LCX stenosis.  RECOMMENDATION:  PCI to LCX/OM system.

## 2011-11-26 NOTE — Brief Op Note (Signed)
11/26/2011  11:49 AM  PATIENT:  [name] [name]  78 y.o. male who presented with Atrial fibrillation - new onset 4 days after severe CP --> noted to have NSTEMI.   Dx cath by Dr. [name] demonstrated 90+ stenosis in distal LCx just after OM2 take-off.  PRE-OPERATIVE DIAGNOSIS:  NSTEMI  POST-OPERATIVE DIAGNOSIS:   Successful PCI of distal AVGroove Circumflex into OM3 with an Intergrity BMS 2.25 mm x 12 mm; post-dilated to 2.35 mm distally & 2.8 mm proximally Rescue PTCA of OM2 jailed by stent with 2.0 x 8 mm Balloon - TIMI 3 flow   PROCEDURE:  Procedure(s) (LRB): Complex bifurcation PCI of AVGroove / distal Left Circumflex with BMS PTCA of OM2 through LCircumflex stent IC Nitroglycerin injections x 6 (200 mcg)  SURGEON:  Surgeon(s) and Role:    * [name], MD - Primary-  Diagnostic Catheterization  [name],[name] - Interventional Cardiologist; PCI  ANESTHESIA:   local and IV sedation; total 3mg  Versed, 50 mcg Fentanyl  Angiomax bolus & gtt - reduced rate x 4 hr post PCI  Plavix 600 mg PO  Contrast (total from Diagnostic & PCI - Omnipaque)  EBL:    < 10 ml  EQUIPMENT:  -- 5Fr upsized to 6 Fr Sheath  GUIDE: 6 F Voda L;   GUIDEWIRES:   Prowater initially in dLCx-OM3 for Stent, redirected into OM2 for PTCA of OM2  BMW initially in OM2, after stent, redirected into dCx-OM3 for final Post-dilation  Pre-dilation: Emerge 2.74mm x 8 mm -- 4 Atm x 30 sec; 6 atm x 35 Sec  Also used for PTCA of OM2 after stent post-dilated with 2.5 balloon; 8 Atms x 45 Sec - TIMI 3 flow  Stent (across OM2 into OM3): Integritiy BMS 2.25 mm x 12 mm  9 Atm x 45 Sec; 12 Atm x 50 Sec (final distal diameter 2.35 mm)  Post Dilation # 1: Garza Quantum Apex 2.5 mm x 8 mm: 16 atm x 45; 18 Atm x 50  Unable to cross into OM2 over repositioned Prowater Balloon --> IC NTG revealed brisk TIMI 3 flow in OM2, therefore, plan for additional PTCA aborted in favor for final Stent post-dilation.  Post  Dilation #2: Maish Vaya Quantum Apex 2.75 mm x 8 mm (for proximal LCx-OM2 bifurcation): 16 Atm x 45 Sec (Final Prox Diameter 2.80 mm)  TR BAND: 14 Atm Air, 1147 hrs  DICTATION: .Note written in EPIC  PLAN OF CARE: Transfer to 2500 for post cath recovery - continue Angiomax x 4 hrs  ASA & Plavix for minimum of 1 month (Short BMS chosen to avoid prolonged DAP + need for full anticoagulation with Afib, also non-diabetic)  Will discuss with Dr. [name] re: plans for final Afib anticoagulation (Warfain vs. Xarelto)  If Convert to Xarelto - fould d/c tomorrow if stable  PATIENT DISPOSITION:  PACU - hemodynamically stable.  Chest pain free.   Delay start of Full Dose anticoagulatiom (heparin / Xarelto) (>6rs post Angiomax off) due to risk of bleeding  [name],[name], M.D., M.S. [facility] [address] [place], [place]  [address]  [phone]  11/26/2011 12:06 PM

## 2011-11-26 NOTE — Progress Notes (Signed)
Subjective: No chest pain. No dyspnea Echo shows findings compatible with inferior wall NSTEMI and mild systolic dysfunction.  Objective: Vital signs in last 24 hours: Temp:  [97.9 F (36.6 C)-98.4 F (36.9 C)] 98.3 F (36.8 C) (03/11 0431) Pulse Rate:  [74-106] 106  (03/11 0431) Resp:  [18-22] 22  (03/11 0431) BP: (118-151)/(61-99) 151/61 mmHg (03/11 0431) SpO2:  [94 %-98 %] 94 % (03/11 0431) Weight change:  Last BM Date: 11/25/11 Intake/Output from previous day: + 240 03/10 0701 - 03/11 0700 In: 1272.9 [P.O.:720; I.V.:552.9] Out: -  Intake/Output this shift:    PE: General:Alert, oriented white male, Pleasant affect. Heart:Irreg irreg Lungs:clear without rales rhonchi or wheezes Abd:+ BS, soft non tender Ext:no edema, 2+ pedal pulses Neuro:A&O X 3, MAE, follows commands.   Lab Results:  Basename 11/26/11 0520 11/25/11 0530  WBC 8.6 9.6  HGB 13.7 13.8  HCT 39.2 38.9*  PLT 160 147*   BMET  Basename 11/26/11 0520 11/25/11 0530  NA 140 138  K 3.8 3.1*  CL 107 103  CO2 23 23  GLUCOSE 106* 108*  BUN 23 29*  CREATININE 1.39* 1.50*  CALCIUM 9.7 9.8    Basename 11/24/11 0926 11/24/11 0257  TROPONINI 4.11* 4.44*    Lab Results  Component Value Date   CHOL 146 11/24/2011   HDL 31* 11/24/2011   LDLCALC 84 11/24/2011   TRIG 156* 11/24/2011   CHOLHDL 4.7 11/24/2011   Lab Results  Component Value Date   HGBA1C 5.5 11/25/2011     Lab Results  Component Value Date   TSH 1.778 11/23/2011    Hepatic Function Panel No results found for this basename: PROT,ALBUMIN,AST,ALT,ALKPHOS,BILITOT,BILIDIR,IBILI in the last 72 hours  Basename 11/24/11 0530  CHOL 146   No results found for this basename: PROTIME in the last 72 hours    EKG: Orders placed during the hospital encounter of 11/23/11  . EKG 12-LEAD  . EKG 12-LEAD  . EKG 12-LEAD  . EKG 12-LEAD  . EKG    Studies/Results: No results found.  Medications: I have reviewed the patient's current medications.     Marland Kitchen aspirin  324 mg Oral Pre-Cath  . aspirin EC  325 mg Oral Daily  . atorvastatin  10 mg Oral q1800  . bimatoprost  1 drop Both Eyes QHS  . heparin  1,000 Units Intravenous Once  . isosorbide mononitrate  15 mg Oral Daily  . magnesium oxide  400 mg Oral BID  . metoprolol succinate  100 mg Oral Daily  . pantoprazole  40 mg Oral Q1200  . potassium chloride  20 mEq Oral TID  . sodium chloride  3 mL Intravenous Q12H  . sodium chloride  3 mL Intravenous Q12H  . DISCONTD: aspirin EC  325 mg Oral Daily  . DISCONTD: magnesium oxide  400 mg Oral BID  . DISCONTD: magnesium oxide  400 mg Oral BID  . DISCONTD: metoprolol succinate  50 mg Oral Daily   Assessment/Plan: Patient Active Problem List  Diagnoses  . Acid reflux  . Chest pain  . HTN (hypertension)  . A-fib, New onset  . NSTEMI (non-ST elevated myocardial infarction) most likely as outpatient  . CKD (chronic kidney disease) stage 3, GFR 30-59 ml/min  Now with hypokalemia   LOS: 3 days   Key new complaints: asymptomatic Key examination changes: no ventricular arrhythmia or clinical signs of CHF; good AF rate control. Key new findings / data: creatinine down to 1.39; K 3.8  PLAN: Cardiac cath +/-  PCI today  Decision to start oral anticoagulants for AF will depend on cath findings and recommended revascularization strategy.  Thurmon Fair, MD, [facility] [phone] 11/26/2011, 8:34 AM

## 2011-11-26 NOTE — Progress Notes (Signed)
ANTICOAGULATION CONSULT NOTE - Follow Up Consult  Pharmacy Consult for Heparin Indication: atrial fibrillation-new onset and NSTEMI  No Known Allergies  Patient Measurements: Height: 6' (182.9 cm) Weight: 224 lb 8 oz (101.833 kg) IBW/kg (Calculated) : 77.6  Heparin Dosing Weight: 98kg Vital Signs: Temp: 98.3 F (36.8 C) (03/11 0431) Temp src: Oral (03/11 0431) BP: 151/61 mmHg (03/11 0431) Pulse Rate: 106  (03/11 0431)  Labs:  Basename 11/26/11 0520 11/25/11 0530 11/24/11 0926 11/24/11 0530 11/24/11 0257 11/23/11 2050 11/23/11 1326  HGB 13.7 13.8 -- -- -- -- --  HCT 39.2 38.9* -- 41.2 -- -- --  PLT 160 147* -- 129* -- -- --  APTT -- -- -- -- -- -- 22*  LABPROT -- -- -- -- -- -- 15.1  INR -- -- -- -- -- -- 1.17  HEPARINUNFRC 0.19* 0.39 -- 0.53 -- -- --  CREATININE 1.39* 1.50* -- 1.57* -- -- --  CKTOTAL -- -- 143 -- 176 245* --  CKMB -- -- 4.8* -- 5.5* 7.0* --  TROPONINI -- -- 4.11* -- 4.44* 5.42* --   Estimated Creatinine Clearance: 55.8 ml/min (by C-G formula based on Cr of 1.39).   Medications:  Scheduled:     . aspirin  324 mg Oral Pre-Cath  . aspirin EC  325 mg Oral Daily  . atorvastatin  10 mg Oral q1800  . bimatoprost  1 drop Both Eyes QHS  . isosorbide mononitrate  15 mg Oral Daily  . magnesium oxide  400 mg Oral BID  . metoprolol succinate  100 mg Oral Daily  . pantoprazole  40 mg Oral Q1200  . potassium chloride  20 mEq Oral TID  . sodium chloride  3 mL Intravenous Q12H  . sodium chloride  3 mL Intravenous Q12H  . DISCONTD: aspirin EC  325 mg Oral Daily  . DISCONTD: magnesium oxide  400 mg Oral BID  . DISCONTD: magnesium oxide  400 mg Oral BID  . DISCONTD: metoprolol succinate  50 mg Oral Daily    Assessment: 78 yo M presented 11/23/11 with NSTEMI and new onset AFib started on heparin. Heparin level (0.19) is below-goal. No problem with line per RN.   Goal of Therapy:  Heparin level 0.3-0.7 units/ml   Plan:  1. Heparin IV bolus of 1000 units x 1,  then increase IV infusion to 1650 units/hr.  2. Heparin level in 8 hours vs follow-up post-cath.   Emeline Gins 11/26/2011,7:32 AM

## 2011-11-27 ENCOUNTER — Encounter (HOSPITAL_COMMUNITY): Payer: Self-pay | Admitting: Cardiology

## 2011-11-27 ENCOUNTER — Other Ambulatory Visit: Payer: Self-pay

## 2011-11-27 DIAGNOSIS — I2510 Atherosclerotic heart disease of native coronary artery without angina pectoris: Secondary | ICD-10-CM

## 2011-11-27 DIAGNOSIS — Z95820 Peripheral vascular angioplasty status with implants and grafts: Secondary | ICD-10-CM

## 2011-11-27 HISTORY — DX: Peripheral vascular angioplasty status with implants and grafts: Z95.820

## 2011-11-27 HISTORY — DX: Atherosclerotic heart disease of native coronary artery without angina pectoris: I25.10

## 2011-11-27 LAB — BASIC METABOLIC PANEL
CO2: 26 mEq/L (ref 19–32)
Chloride: 103 mEq/L (ref 96–112)
Creatinine, Ser: 1.49 mg/dL — ABNORMAL HIGH (ref 0.50–1.35)
GFR calc Af Amer: 51 mL/min — ABNORMAL LOW (ref >90)
Potassium: 4.2 mEq/L (ref 3.5–5.1)
Sodium: 137 mEq/L (ref 135–145)

## 2011-11-27 MED ORDER — LOSARTAN POTASSIUM 50 MG PO TABS
100.0000 mg | ORAL_TABLET | Freq: Every day | ORAL | Status: DC
  Administered 2011-11-27: 100 mg via ORAL
  Filled 2011-11-27 (×2): qty 2

## 2011-11-27 MED ORDER — ALUM & MAG HYDROXIDE-SIMETH 200-200-20 MG/5ML PO SUSP: 30.0000 mL | ORAL | Status: DC | PRN

## 2011-11-27 MED ORDER — METOPROLOL SUCCINATE ER 50 MG PO TB24
150.0000 mg | ORAL_TABLET | Freq: Every day | ORAL | Status: DC
  Administered 2011-11-27 – 2011-11-28 (×2): 150 mg via ORAL
  Filled 2011-11-27 (×2): qty 1

## 2011-11-27 MED ORDER — OFF THE BEAT BOOK
Freq: Once | Status: AC
  Administered 2011-11-27: 08:00:00
  Filled 2011-11-27: qty 1

## 2011-11-27 MED FILL — Dextrose Inj 5%: INTRAVENOUS | Qty: 50 | Status: AC

## 2011-11-27 NOTE — Progress Notes (Signed)
Atrial fib with RVR when Pt. Is ambulating.  120 up to 140    Subjective: No Complaints, walking in the hall  Objective: Vital signs in last 24 hours: Temp:  [97.5 F (36.4 C)-98.8 F (37.1 C)] 97.9 F (36.6 C) (03/12 0759) Pulse Rate:  [55-115] 108  (03/12 0456) Resp:  [15-23] 20  (03/12 0759) BP: (136-154)/(90-107) 154/99 mmHg (03/12 0759) SpO2:  [97 %-100 %] 98 % (03/12 0456) Weight:  [104.9 kg (231 lb 4.2 oz)] 104.9 kg (231 lb 4.2 oz) (03/12 0500) Weight change:  Last BM Date: 11/26/11 Intake/Output from previous day: +1416 03/11 0701 - 03/12 0700 In: 680 [P.O.:680] Out: 1300 [Urine:1300] Intake/Output this shift:    PE: General:A&O X 3 Pleasant affect. Heart: Irreg irreg Lungs:clear Abd:+ BS soft, non tender Ext:no edema.    Lab Results:  Basename 11/26/11 0520 11/25/11 0530  WBC 8.6 9.6  HGB 13.7 13.8  HCT 39.2 38.9*  PLT 160 147*   BMET  Basename 11/27/11 0420 11/26/11 0520  NA 137 140  K 4.2 3.8  CL 103 107  CO2 26 23  GLUCOSE 106* 106*  BUN 23 23  CREATININE 1.49* 1.39*  CALCIUM 9.7 9.7    Basename 11/24/11 0926  TROPONINI 4.11*    Lab Results  Component Value Date   CHOL 146 11/24/2011   HDL 31* 11/24/2011   LDLCALC 84 11/24/2011   TRIG 156* 11/24/2011   CHOLHDL 4.7 11/24/2011   Lab Results  Component Value Date   HGBA1C 5.5 11/25/2011     Lab Results  Component Value Date   TSH 1.778 11/23/2011    Hepatic Function Panel No results found for this basename: PROT,ALBUMIN,AST,ALT,ALKPHOS,BILITOT,BILIDIR,IBILI in the last 72 hours No results found for this basename: CHOL in the last 72 hours No results found for this basename: PROTIME in the last 72 hours    EKG: Orders placed during the hospital encounter of 11/23/11  . EKG  . EKG 12-LEAD  . EKG 12-LEAD  . EKG 12-LEAD  . EKG 12-LEAD    Studies/Results: Cardiac cath:  Angiographic Findings:  1. The left main coronary artery is free of significant atherosclerosis and bifurcates in  the usual fashion into the left anterior descending artery and left circumflex coronary artery.  2. The left anterior descending artery is a large vessel that reaches the apex and generates Two major diagonal branches. The first diagonal is relatively small and has a smooth 80% stenosis. The second diagonal is very large and serves as an accessory LAD. Mild luminal irregularities, but no significant stenoses are seen in the LAD or second diagonal arteries.  3. The left circumflex coronary artery is a large-size vessel non dominant vessel that generates three major oblque marginal arteries, the first of which is diminutive. There is evidence of mild luminal irregularities and minimal calcification. There is 60-70% ostial stenosis in the second OM and there is subtotal (>95%) stenosis in the left circumflex artery just beyond the OM2. This latter lesion has the features of a culprit stenosis and fits the inferior wall distribution on the echocardiogram.  4. The right coronary artery is a large-size dominant vessel that generates a very small posterior lateral ventricular system as well as a very large and long posterior descending artery. PDA.  5. The left ventricle was not injected, but the aortic valve was crossed with the JR catheter. There is no aortic valve stenosis by pullback. The left ventricular end-diastolic pressure is 19 mm Hg.   POST-OPERATIVE  DIAGNOSIS:  Successful PCI of distal AVGroove Circumflex into OM3 with an Intergrity BMS 2.25 mm x 12 mm; post-dilated to 2.35 mm distally & 2.8 mm proximally  Rescue PTCA of OM2 jailed by stent with 2.0 x 8 mm Balloon - TIMI 3 flow    Medications: I have reviewed the patient's current medications.    Marland Kitchen aspirin EC  325 mg Oral Daily  . atorvastatin  10 mg Oral q1800  . bivalirudin      . bivalirudin      . clopidogrel      . clopidogrel  75 mg Oral Q breakfast  . fentaNYL      . heparin      . heparin      . isosorbide mononitrate  15 mg Oral  Daily  . lidocaine      . magnesium oxide  400 mg Oral BID  . metoprolol succinate  100 mg Oral Daily  . midazolam      . midazolam      . nitroGLYCERIN      . off the beat book   Does not apply Once  . pantoprazole  40 mg Oral Q1200  . rivaroxaban  20 mg Oral Q1200  . sodium chloride  3 mL Intravenous Q12H  . verapamil      . DISCONTD: aspirin EC  325 mg Oral Daily  . DISCONTD: bimatoprost  1 drop Both Eyes QHS  . DISCONTD: sodium chloride  3 mL Intravenous Q12H  . DISCONTD: sodium chloride  3 mL Intravenous Q12H   Assessment/Plan: Patient Active Problem List  Diagnoses  . Acid reflux  . Chest pain  . HTN (hypertension)  . A-fib, New onset  . NSTEMI (non-ST elevated myocardial infarction) most likely as outpatient  . CKD (chronic kidney disease) stage 3, GFR 30-59 ml/min  . CAD (coronary artery disease), stent placed 11/26/11   . S/P angioplasty with stent, 11/26/11 distal AVGroove LCX into OM3 with BMS (Integrity) and rescue PTCA on OM2 jailed by stent.   PLAN:Adjust meds to control HR with ambulation,   BP elevated, but was on ace and arb. On Toprol 100mg . ? Resume losartan? Vs. Adding cardizem with decrease in LV will need either ACE or ARB. On xarelto for a. Fib new added today. Serum Cr. Slightly higher,   A. Fib with Rt. BBB--once anticoagulated X 3 months then ? DCCV EF 45-50% Pt. Lives in [place] ? When OK to travel?--follow up with PA or Dr. [name] on Friday then if stable return to [place]. PTH  74.9  ( normal 14-72)  DVR have been made of echo and cath to go with the patient to [place].  PCP  Dr. Delphia [facility] [address] [place], [place]  [address] [phone]   LOS: 4 days   INGOLD,LAURA R 11/27/2011, 8:25 AM    Agree with note written by Nada Boozer RNP  NSTEMI S/P PCI/Stent LCX-OM with BMS by Dr. [facility]. EF 45-50% with inferior and lateral WMA. AFIB with RVR. On BB which will need to be adjusted. BP  increased and was on ACE-I and ARB as OP which were held for cath secondary to RI. Will restart ARB. Transfer to tele. [facility]. Prob D/C home on Xaralto. F/U with cardiologist in [place]. Will prob need to be DCCV in next 4-6 weeks.  Runell Gess 11/27/2011 9:48 AM

## 2011-11-27 NOTE — CV Procedure (Signed)
[facility]     CARDIAC CATHETERIZATION REPORT  NAME: [name] [name] MRN: [id number] DOB: 12-08-1932  ADMIT DATE: 11/23/2011  Performing Cardiologist: Marykay Lex  Primary Physician: Sheila Oats, MD, MD Primary Cardiologist:  N/A - not from [facility]   Procedures Performed:  IC NTG Injection 200 mcg x 6  Complex Bifurcation Percutaneous Coronary Artery Intervention on Distal Left Circumflex-OM2-OM3 trifurcation with a 2.25 mm x 12  mm INTEGRITY BMS STENT; final diameter 2.80 mm proximal to & at OM2 take-off, 2.35 distally in OM3  Rescue PTCA of Jailed OM2 branch with 2.0 mm x 8 mm Balloon   Indication(s): NSTEMI  New onset Atrial Fibrillation  History: [name] [name] is a 78 y.o. male with a PMH of HTN who presented to [facility] ~4 days after sufffering from 7/10 substernal chest pressure associated with dyspnea on exertion and belching.  This occurred while vacationing in [place], but he decided to stop in [place], since he has family here en route back to [place].  On presentation, he was noted to be in Atrial Fibrillation with rapid Ventricular Response along with positive, but downward trending cardiac biomarkers.   Dr. [name] has performed a diagnostic cardiac catheterization via the Right Radial Artery approach today that demonstrated a severe, sub-totally occluded mid-distal Left Circumflex just after OM2.  The ostium of the OM2 has ~50-60% lesion -- that was thought to be the culprit lesion.  There was also a ~80-90% lesion in a small D2.  See the separtate cath report for details.  Consent: Consent for possible PCI had been obtained prior to initiation of the diagnostic procedure.  PERCUTANEOUS CORONARY INTERVENTION PROCEDURE  After reviewing the initial cineangiography images, the culprit lesion(s) were identified, and the decision was made to proceed with percutaneous coronary intervention.  The initial plan was to attempt  PTCA alone of the "ostial continued Left Circumflex" given the relatively short distance between the lesion and the next bifurcation into OM3 and the AV Groove Circumflex.   The 5Fr Sheath was exchanged over a wire for a 6Fr sheath with the Long-exchange safety J-wire advanced using the 5Fr JR4 diagnostic catheter into the ascending aorta.   A weight based bolus of IV Angiomax was administered and the drip was continued until completion of the procedure, with the plan to continue for 4 hours post-procedure.   Oral Clopidogrel 600 mg was administered.  A 6Fr Voda Left 3.5 Guide catheter was advanced over the Long-exchange safety J-wire and used to engage the left Coronary Artery. An ACT of > 200 Sec was confirmed prior to advancing the Guidewire. Multiple injections of IC NTG were administered following balloon inflations, prior to "scout" and completion angiography. Two separate guidewires were used to wire both OM2 (BMW) and OM3 (Prowater).   Once the pre-dilation was performed, and it was felt that placement of a stent was necessary, the OM2 wire was pulled back to the guide to avoid trapping it in the OM2 that was to be "jailed".  This wire was then advanced through the newly placed stent into the OM3 (where the distal edge of the stent landed) and the OM3 wire was pulled back within the stent to cross through the stent struts into the OM2.  The initial attempt to cross into OM2 was not successful, so the stent was post-dilated with a 2.5 mm balloon to expand the stent struts.  This allowed the wire and a 2.0 mm balloon to be passed to perform "rescue  PTCA" of the ostium of the jailed OM2 that had been "pinched" by plaque shift.  Once brisk TIMI 3 flow was restored in OM2, the proximal stent was then further post-dilated using a 2.75 mm Non-compliant balloon -- resulting in a final diameter of ~2.8 mm proximally.  Following this final post-dilation, there was significant spasm noted in OM3 and the AV  Groove Circumflex that was relieved after several rounds of IC NTG injection.    Lesion #1:  "Ostium" of mid Left Circumflex from larger OM2  Pre-PCI Stenosis: 90 % Post-PCI Stenosis: 0 %     TIMI 3 flow       TIMI 3 flow  Guide Catheter: VL 3.5 Guidewires: #1 (OM3 to OM2) Prowater, #2 (OM2 to OM3) BMW  Pre-Dilitation Balloon: Emerge MR 2.0 mm x 8 mm  1st Inflation:  4 Atm for 30 Sec  2nd Inflation: 6 Atm for 35 Sec Scout angiography did not reveal evidence of dissection or perforation, but did show evidence of plaque shift into OM2.   STENT: Integrity BMS 2.25 mm x 12 mm   1st Inflation: 9 Atm for 42 Sec  2nd Inflation: 12 Atm for 55 Sec; final distal diameter 2.35 mm Scout angiography did not reveal evidence of dissection or perforation, but did show evidence of plaque shift into OM2.   Post-Dilitation Balloon #1: Harmony Quantum Apex MR 2.5 mm x 8 mm (in the proximal to mid stent)  1st Inflation: 16 Atm for 47 Sec  2nd Inflation:18 Atm for 50 Sec  Scout angiography did not reveal evidence of dissection or perforation.  Wire switch was successfully performed with the BMW now in OM3 and the Prowater in OM2.  Lesion 1a:  Ostium of OM2  Pre-PCI Stenosis: 50-60% then ~90% after stent placement Post-PCI Stenosis: 50%     TIMI 3 flow       TIMI 3 flow  Guidewire: Prowater (after BMW placed into OM2 & initial post-dilation of stent)  Balloon: Emerge MR 2.0 mm x 8 mm   1st Inflation: 8 Atm for 45 Sec Scout angiography did not reveal evidence of dissection or perforation with TIMI 3 flow confirmed, and reduction in lesion severity.   Post-Dilitation Balloon #2: [place] Quantum Apex 2.75 mm x 8 mm   1st Inflation: 16 Atm for 49 Sec; final diameter ~2.8 mm  Post-deployment cineangiography with and without guidewire in place was performed in multiple orthogonal views demonstrating adquate stent deployment with acceptable flow in all 3 branches involved following IC NTG injection.  The images  revealed no evidence of dissection or perforation.  Once brisk TIMI 3 flow was confirmed, and OM2 has brisk flow, the wires were removed for final angiography in orthogonal views.  The catheter was then removed completely out of the body over th long-exchange wire.    The sheath was removed in the Cath Lab with a TR band placed at 14 ml Air at 1147 hrs.  Reverse Allen's test with plethysmography revealed non-occlusive hemostasis.  The patient was transported to the PACU then 2500 (post-procedure unit) in hemodynamically stable, angina free condition.   The patient  was stable before, during and following the procedure.   Patient did tolerate procedure well. There were no complications.  EBL: < 10 ml  Medications:  Sedation:  3 mg IV Versed, 50 IV mcg Fentanyl  Contrast:  Omnipaque total  IV Angiomax Bolus & Drip  Clopidogrel 600 mg   IC Nitroglycerin 200 mcg x 6  Impression:   Successful Complex Bifurcation PCI of distal Left Circumflex - OM3 using an Integrity BMS 2.25 mm x 12 mm with "rescue" PTCA of ostial OM2  Plan:  Transfer to post-procedure unit for post-radial cath care  Continue Angiomax gtt at decreased rate for 4 hrs to allow for full effect of Clopidogrel  Will initiate full anticoagulation with Xareltofor Atrial Fibrillation in the AM   Possible discharge in the AM pending Afib controll  Will provide DVD copy of cath/PCI films for the patient to take back to [place]  The case and results was discussed with the patient (and family).  Time Spend Directly with Patient:  90 minutes  Remy Dia W, M.D., M.S. [facility] [address] [place], [place]  [address]  [phone]  11/27/2011 8:40 PM

## 2011-11-27 NOTE — Progress Notes (Signed)
   CARE MANAGEMENT NOTE 11/27/2011  Patient:  [name] [name]   Account Number:  [id number]  Date Initiated:  11/27/2011  Documentation initiated by:  GRAVES-BIGELOW,Lennan Malone  Subjective/Objective Assessment:   Pt admitted with Atrial fibrillation, chest pain. Pt was visiting daughter. Currently lives with wife and they travel often. Plan for home on xarelto when stable for d/c.     Action/Plan:   No discount card available for xarelto at this time. Benefits check in process. Will make pt aware when completed. MD please just write original rx with refills.   Anticipated DC Date:  11/27/2011   Anticipated DC Plan:  HOME/SELF CARE      DC Planning Services  CM consult      Choice offered to / List presented to:             Status of service:  Completed, signed off Medicare Important Message given?   (If response is "NO", the following Medicare IM given date fields will be blank) Date Medicare IM given:   Date Additional Medicare IM given:    Discharge Disposition:  HOME/SELF CARE  Per UR Regulation:    If discussed at Long Length of Stay Meetings, dates discussed:    Comments:

## 2011-11-27 NOTE — Progress Notes (Signed)
CARDIAC REHAB PHASE I   PRE:  Rate/Rhythm: 124 afib  BP:  Supine:   Sitting: 154/99  Standing:    SaO2: 99%RA  MODE:  Ambulation: 680 ft   POST:  Rate/Rhythem: 130 afib  BP:  Supine:   Sitting: 167/100  Standing:    SaO2: 98%RA -0800-0900 Pt walked 680 ft with steady gait. Denied chest pain. Education completed with pt and family. Discussed CRP 2 for when pt returns to [place]. BP elevated. Encouraged pt to watch sodium.  [name] [name]

## 2011-11-28 LAB — BASIC METABOLIC PANEL
BUN: 23 mg/dL (ref 6–23)
Calcium: 10.5 mg/dL (ref 8.4–10.5)
Creatinine, Ser: 1.6 mg/dL — ABNORMAL HIGH (ref 0.50–1.35)
GFR calc Af Amer: 47 mL/min — ABNORMAL LOW (ref >90)
GFR calc non Af Amer: 40 mL/min — ABNORMAL LOW (ref >90)
Glucose, Bld: 110 mg/dL — ABNORMAL HIGH (ref 70–99)

## 2011-11-28 LAB — CBC
Hemoglobin: 13.6 g/dL (ref 13.0–17.0)
MCH: 30.7 pg (ref 26.0–34.0)
MCHC: 34.4 g/dL (ref 30.0–36.0)
RDW: 13.4 % (ref 11.5–15.5)

## 2011-11-28 LAB — MAGNESIUM: Magnesium: 2 mg/dL (ref 1.5–2.5)

## 2011-11-28 MED ORDER — METOPROLOL SUCCINATE ER 50 MG PO TB24: 150.0000 mg | ORAL_TABLET | Freq: Every day | ORAL | Status: AC

## 2011-11-28 MED ORDER — CLOPIDOGREL BISULFATE 75 MG PO TABS: 75.0000 mg | ORAL_TABLET | Freq: Every day | ORAL | Status: AC

## 2011-11-28 MED ORDER — ATORVASTATIN CALCIUM 10 MG PO TABS: 10.0000 mg | ORAL_TABLET | Freq: Every day | ORAL | Status: AC

## 2011-11-28 MED ORDER — RIVAROXABAN 20 MG PO TABS: 20.0000 mg | ORAL_TABLET | Freq: Every day | ORAL | Status: DC

## 2011-11-28 MED ORDER — DILTIAZEM HCL ER COATED BEADS 120 MG PO CP24
120.0000 mg | ORAL_CAPSULE | Freq: Every day | ORAL | Status: DC
  Administered 2011-11-28: 120 mg via ORAL
  Filled 2011-11-28: qty 1

## 2011-11-28 MED ORDER — ISOSORBIDE MONONITRATE 15 MG HALF TABLET: 15.0000 mg | ORAL_TABLET | Freq: Every day | ORAL | Status: DC

## 2011-11-28 MED ORDER — DILTIAZEM HCL ER COATED BEADS 120 MG PO CP24: 120.0000 mg | ORAL_CAPSULE | Freq: Every day | ORAL | Status: AC

## 2011-11-28 MED ORDER — NITROGLYCERIN 0.4 MG SL SUBL: 0.4000 mg | SUBLINGUAL_TABLET | SUBLINGUAL | Status: AC | PRN

## 2011-11-28 NOTE — Discharge Summary (Signed)
Physician Discharge Summary  Patient ID: [name] [name] MRN: [id number] DOB/AGE: June 08, 1933 78 y.o.  Admit date: 11/23/2011 Discharge date: 11/28/2011  Admission Diagnoses:  Afib/NSTEMI  Discharge Diagnoses:  Principal Problem:  *A-fib, New onset Active Problems:  Chest pain  HTN (hypertension)  NSTEMI (non-ST elevated myocardial infarction) most likely as outpatient  CKD (chronic kidney disease) stage 3, GFR 30-59 ml/min  CAD (coronary artery disease), stent placed 11/26/11   S/P angioplasty with stent, 11/26/11 distal AVGroove LCX into OM3 with BMS (Integrity) and rescue PTCA on OM2 jailed by stent.   Discharged Condition: stable  Hospital Course:  The patient is a 78 year old Caucasian male who lives 3 months of the year in the [place], one month in the [place] area, the rest of the time in [place]. He has a history of hypertension, gastroesophageal reflux disease and prostate surgery approximately 10 years ago. He stated he was in the [place] on Monday when he developed a "hot, burning pressure-like" sensation in his chest. Had associated belching, dyspnea on exertion. He states the pressure at its worst was a 7/10 in intensity. It was resolved when he presented to ER. The patient presented to urgent care for follow up of his chest pain and an EKG was completed and showed atrial fibrillation with RVR. He was subsequently sent to [facility] for further evaluation. He denied shortness of breath, orthopnea, PND, abdominal pain.  Initial troponin was 3.01 and peaked at 5.42.  Calcium was elevated at 12.2.  PTH was checked at found to be 74.9. Phos low at 2.2 with a normal TSH.  Calcium has subsequently normalized.   Left heart cath was completed on 11/26/11 and revealed a subtotal (>95%) stenosis in the left circumflex artery just beyond the OM2.(see below for further details).  This vessel was intervened on by Dr. [name] with an Integrity BMS.  2D echo revealed an EF of  45-50%(see below) He will be discharge in stable condition on ASA, Plavix/Xarelto/statin/ diltiazem/BB/ARB.  He will follow up at [facility] next week prior to heading to [place].  He will also get a followup BMET at [facility].  He was seen by Dr. [name] who feels he is ready to go home.    Consults: None  Significant Diagnostic Studies:   3/11/13Diagnostic Left Heart cath Angiographic Findings:  1. The left main coronary artery is free of significant atherosclerosis and bifurcates in the usual fashion into the left anterior descending artery and left circumflex coronary artery.  2. The left anterior descending artery is a large vessel that reaches the apex and generates Two major diagonal branches. The first diagonal is relatively small and has a smooth 80% stenosis. The second diagonal is very large and serves as an accessory LAD. Mild luminal irregularities, but no significant stenoses are seen in the LAD or second diagonal arteries.  3. The left circumflex coronary artery is a large-size vessel non dominant vessel that generates three major oblque marginal arteries, the first of which is diminutive. There is evidence of mild luminal irregularities and minimal calcification. There is 60-70% ostial stenosis in the second OM and there is subtotal (>95%) stenosis in the left circumflex artery just beyond the OM2. This latter lesion has the features of a culprit stenosis and fits the inferior wall distribution on the echocardiogram.  4. The right coronary artery is a large-size dominant vessel that generates a very small posterior lateral ventricular system as well as a very large and long posterior descending artery. PDA.  5. The left ventricle was not injected, but the aortic valve was crossed with the JR catheter. There is no aortic valve stenosis by pullback. The left ventricular end-diastolic pressure is 19 mm Hg.   IMPRESSIONS:  NSTEMI due to distal LCX stenosis.  PCI to Lcirc PATIENT: [name] [name] 78  y.o. male who presented with Atrial fibrillation - new onset 4 days after severe CP --> noted to have NSTEMI.  Dx cath by Dr. [name] demonstrated 90+ stenosis in distal LCx just after OM2 take-off.  PRE-OPERATIVE DIAGNOSIS: NSTEMI  POST-OPERATIVE DIAGNOSIS:  Successful PCI of distal AVGroove Circumflex into OM3 with an Intergrity BMS 2.25 mm x 12 mm; post-dilated to 2.35 mm distally & 2.8 mm proximally  Rescue PTCA of OM2 jailed by stent with 2.0 x 8 mm Balloon - TIMI 3 flow  PROCEDURE: Procedure(s) (LRB):  Complex bifurcation PCI of AVGroove / distal Left Circumflex with BMS  PTCA of OM2 through LCircumflex stent  IC Nitroglycerin injections x 6 (200 mcg)  SURGEON: Surgeon(s) and Role:  * [name], MD - Primary- Diagnostic Catheterization  [name],[name] - Interventional Cardiologist; PCI  ANESTHESIA: local and IV sedation; total 3mg  Versed, 50 mcg Fentanyl  Angiomax bolus & gtt - reduced rate x 4 hr post PCI  Plavix 600 mg PO  Contrast (total from Diagnostic & PCI - Omnipaque) EBL: < 10 ml  EQUIPMENT: -- 5Fr upsized to 6 Fr Sheath  GUIDE: 6 F Voda L;  GUIDEWIRES:  Prowater initially in dLCx-OM3 for Stent, redirected into OM2 for PTCA of OM2  BMW initially in OM2, after stent, redirected into dCx-OM3 for final Post-dilation  Pre-dilation: Emerge 2.29mm x 8 mm -- 4 Atm x 30 sec; 6 atm x 35 Sec  Also used for PTCA of OM2 after stent post-dilated with 2.5 balloon; 8 Atms x 45 Sec - TIMI 3 flow Stent (across OM2 into OM3): Integritiy BMS 2.25 mm x 12 mm  9 Atm x 45 Sec; 12 Atm x 50 Sec (final distal diameter 2.35 mm) Post Dilation # 1: [place] Quantum Apex 2.5 mm x 8 mm: 16 atm x 45; 18 Atm x 50  Unable to cross into OM2 over repositioned Prowater Balloon --> IC NTG revealed brisk TIMI 3 flow in OM2, therefore, plan for additional PTCA aborted in favor for final Stent post-dilation.  Post Dilation #2: [place] Quantum Apex 2.75 mm x 8 mm (for proximal LCx-OM2 bifurcation): 16 Atm x 45  Sec (Final Prox Diameter 2.80 mm) TR BAND: 14 Atm Air, 1147 hrs  DICTATION: .Note written in EPIC  PLAN OF CARE: Transfer to 2500 for post cath recovery - continue Angiomax x 4 hrs  ASA & Plavix for minimum of 1 month (Short BMS chosen to avoid prolonged DAP + need for full anticoagulation with Afib, also non-diabetic)  Will discuss with Dr. [name] re: plans for final Afib anticoagulation (Warfain vs. Xarelto)  If Convert to Xarelto - fould d/c tomorrow if stable PATIENT DISPOSITION: PACU - hemodynamically stable. Chest pain free.  Delay start of Full Dose anticoagulatiom (heparin / Xarelto) (>6rs post Angiomax off) due to risk of bleeding  [name],[name], M.D., M.S.  [facility]  [address]  [place], [place] [address]  [phone]   2D echo LV EF: 45% - 50%  ------------------------------------------------------------ Study Conclusions  - Left ventricle: The cavity size was normal. There was moderate concentric hypertrophy. Systolic function was mildly reduced. The estimated ejection fraction was in the  range of 45% to 50%. Moderate hypokinesis of the basal-midinferior and inferoseptal myocardium. Atrial fibrillation limits evaluation of LV diastolic function. E/e'<8 consistent with normal left atrial pressure. - Left atrium: The atrium was mildly dilated. Transthoracic echocardiography. M-mode, complete 2D, spectral Doppler, and color Doppler. Patient status: Inpatient.  Treatments: Left circ Integrity BMS, ASA/Plavix/Xarelto  Discharge Exam: Blood pressure 166/106, pulse 102, temperature 98.7 F (37.1 C), temperature source Oral, resp. rate 18, height 6' (1.829 m), weight 102.4 kg (225 lb 12 oz), SpO2 97.00%.   Disposition: Final discharge disposition not confirmed  Discharge Orders    Future Orders Please Complete By Expires   Diet - low sodium heart healthy      Increase activity slowly      Discharge instructions       Comments:   No lifting or driving for three days.     Medication List  As of 11/28/2011 10:14 AM   STOP taking these medications         lisinopril-hydrochlorothiazide 20-25 MG per tablet         TAKE these medications         aspirin EC 81 MG tablet   Take 81 mg by mouth daily.      atorvastatin 10 MG tablet   Commonly known as: LIPITOR   Take 1 tablet (10 mg total) by mouth daily at 6 PM.      clopidogrel 75 MG tablet   Commonly known as: PLAVIX   Take 1 tablet (75 mg total) by mouth daily with breakfast.      diltiazem 120 MG 24 hr capsule   Commonly known as: CARDIZEM CD   Take 1 capsule (120 mg total) by mouth daily.      isosorbide mononitrate 15 mg Tb24   Commonly known as: IMDUR   Take 0.5 tablets (15 mg total) by mouth daily.      losartan 100 MG tablet   Commonly known as: COZAAR   Take 100 mg by mouth daily.      metoprolol succinate 50 MG 24 hr tablet   Commonly known as: TOPROL-XL   Take 3 tablets (150 mg total) by mouth daily. Take with or immediately following a meal.      nitroGLYCERIN 0.4 MG SL tablet   Commonly known as: NITROSTAT   Place 1 tablet (0.4 mg total) under the tongue every 5 (five) minutes x 3 doses as needed for chest pain.      omeprazole 20 MG capsule   Commonly known as: PRILOSEC   Take 20 mg by mouth daily.      Rivaroxaban 20 MG Tabs   Take 20 mg by mouth daily at 12 noon.           Follow-up Information    Follow up with DEFAULT,PROVIDER, MD.      Follow up with Thurmon Fair, MD. (You will See Dr. Renaye Rakers --[facility]  at 2:40)    Contact information:   [address] [place] [address] [phone]          Signed: Dwana Melena 11/28/2011, 10:14 AM

## 2011-11-28 NOTE — Plan of Care (Signed)
Problem: Discharge Progression Outcomes Goal: Sinus rate/atrial ECG rhythm with HR < 100/min Outcome: Completed/Met Date Met:  11/28/11 Decreased with medication, increased at time for redosing, medication readjusted at time of discharge  Problem: Discharge Progression Outcomes Goal: Barriers To Progression Addressed/Resolved Outcome: Completed/Met Date Met:  11/28/11 VSS adequate for discharge Goal: Lipid-lowering therapy prescribed Outcome: Completed/Met Date Met:  11/28/11 On lipitor

## 2011-11-28 NOTE — Progress Notes (Signed)
CARDIAC REHAB PHASE I   PRE:  Rate/Rhythm: 106-120s afib  BP:  Supine: 164/114  Sitting:   Standing:    SaO2:   MODE:  Ambulation: 680 ft   POST:  Rate/Rhythem: 143 afib  BP:  Supine:   Sitting: 160/117  Standing:    SaO2:  0835-0855 Pt walked 680 ft with steady gait. No complaints. meds being adjusted for BP.  [name] [name]

## 2011-11-28 NOTE — Progress Notes (Addendum)
Subjective:  No CP/SOB  Objective:  Temp:  [97.7 F (36.5 C)-98.7 F (37.1 C)] 98.7 F (37.1 C) (03/13 0700) Pulse Rate:  [97-102] 102  (03/13 0700) Resp:  [18-20] 18  (03/13 0700) BP: (138-166)/(93-109) 166/106 mmHg (03/13 0700) SpO2:  [95 %-98 %] 97 % (03/13 0700) Weight:  [102.4 kg (225 lb 12 oz)] 102.4 kg (225 lb 12 oz) (03/13 0547) Weight change: -2.5 kg (-5 lb 8.2 oz)  Intake/Output from previous day: 03/12 0701 - 03/13 0700 In: 443 [P.O.:440; I.V.:3] Out: -   Intake/Output from this shift:    Physical Exam: General appearance: alert and cooperative Neck: no adenopathy, no carotid bruit, no JVD, supple, symmetrical, trachea midline and thyroid not enlarged, symmetric, no tenderness/mass/nodules Lungs: clear to auscultation bilaterally Heart: irregularly irregular rhythm Extremities: extremities normal, atraumatic, no cyanosis or edema  Lab Results: Results for orders placed during the hospital encounter of 11/23/11 (from the past 48 hour(s))  POCT ACTIVATED CLOTTING TIME     Status: Normal   Collection Time   11/26/11  9:03 AM      Component Value Range Comment   Activated Clotting Time 468     BASIC METABOLIC PANEL     Status: Abnormal   Collection Time   11/27/11  4:20 AM      Component Value Range Comment   Sodium 137  135 - 145 (mEq/L)    Potassium 4.2  3.5 - 5.1 (mEq/L)    Chloride 103  96 - 112 (mEq/L)    CO2 26  19 - 32 (mEq/L)    Glucose, Bld 106 (*) 70 - 99 (mg/dL)    BUN 23  6 - 23 (mg/dL)    Creatinine, Ser 1.61 (*) 0.50 - 1.35 (mg/dL)    Calcium 9.7  8.4 - 10.5 (mg/dL)    GFR calc non Af Amer 44 (*) >90 (mL/min)    GFR calc Af Amer 51 (*) >90 (mL/min)   BASIC METABOLIC PANEL     Status: Abnormal   Collection Time   11/28/11  5:00 AM      Component Value Range Comment   Sodium 138  135 - 145 (mEq/L)    Potassium 4.7  3.5 - 5.1 (mEq/L)    Chloride 104  96 - 112 (mEq/L)    CO2 26  19 - 32 (mEq/L)    Glucose, Bld 110 (*) 70 - 99 (mg/dL)    BUN  23  6 - 23 (mg/dL)    Creatinine, Ser 0.96 (*) 0.50 - 1.35 (mg/dL)    Calcium 04.5  8.4 - 10.5 (mg/dL)    GFR calc non Af Amer 40 (*) >90 (mL/min)    GFR calc Af Amer 47 (*) >90 (mL/min)   CBC     Status: Normal   Collection Time   11/28/11  5:00 AM      Component Value Range Comment   WBC 9.1  4.0 - 10.5 (K/uL)    RBC 4.43  4.22 - 5.81 (MIL/uL)    Hemoglobin 13.6  13.0 - 17.0 (g/dL)    HCT 40.9  81.1 - 91.4 (%)    MCV 89.2  78.0 - 100.0 (fL)    MCH 30.7  26.0 - 34.0 (pg)    MCHC 34.4  30.0 - 36.0 (g/dL)    RDW 78.2  95.6 - 21.3 (%)    Platelets 199  150 - 400 (K/uL)   MAGNESIUM     Status: Normal   Collection Time  11/28/11  5:00 AM      Component Value Range Comment   Magnesium 2.0  1.5 - 2.5 (mg/dL)     Imaging: Imaging results have been reviewed  Assessment/Plan:   1. Principal Problem: 2.  *A-fib, New onset 3. Active Problems: 4.  Chest pain 5.  HTN (hypertension) 6.  NSTEMI (non-ST elevated myocardial infarction) most likely as outpatient 7.  CKD (chronic kidney disease) stage 3, GFR 30-59 ml/min 8.  CAD (coronary artery disease), stent placed 11/26/11  9.  S/P angioplasty with stent, 11/26/11 distal AVGroove LCX into OM3 with BMS (Integrity) and rescue PTCA on OM2 jailed by stent. 10.   Time Spent Directly with Patient:  30 minutes  Length of Stay:  LOS: 5 days   S/P complex PCI/Stent with BMS by Dr. [name] 11/26/11. No CP/SOB. Exam benign. Meds adjusted yesterday for increased VR to Afib and HTN. HR still in the 120-140 range and BP up as well. Scr increased to 1.6 from 1.4 after the re addition an ARB. He can be sent home on  Asa 81mg , plavix and Xaralto. Will add low dose Cardizem 120 mg for rate control. Needs BMP next Tuesday and then ROV with Dr. Salena Saner next Wednesday. Needs to obtain a cardiologist in [place] as soon as he returns home. CD of cath provided to pt.  Runell Gess 11/28/2011, 8:34 AM

## 2014-08-26 ENCOUNTER — Encounter (HOSPITAL_COMMUNITY): Payer: Self-pay | Admitting: Cardiovascular Disease

## 2020-12-20 ENCOUNTER — Other Ambulatory Visit: Payer: Self-pay

## 2020-12-20 ENCOUNTER — Inpatient Hospital Stay (HOSPITAL_COMMUNITY)
Admission: EM | Admit: 2020-12-20 | Discharge: 2021-01-04 | DRG: 682 | Disposition: A | Discharge status: Skilled Nursing Facility | Payer: Medicare Other | Attending: Internal Medicine | Admitting: Internal Medicine

## 2020-12-20 ENCOUNTER — Encounter (HOSPITAL_COMMUNITY): Payer: Self-pay | Admitting: Emergency Medicine

## 2020-12-20 DIAGNOSIS — Z8546 Personal history of malignant neoplasm of prostate: Secondary | ICD-10-CM

## 2020-12-20 DIAGNOSIS — T189XXA Foreign body of alimentary tract, part unspecified, initial encounter: Secondary | ICD-10-CM

## 2020-12-20 DIAGNOSIS — Y93E1 Activity, personal bathing and showering: Secondary | ICD-10-CM

## 2020-12-20 DIAGNOSIS — T1490XA Injury, unspecified, initial encounter: Secondary | ICD-10-CM

## 2020-12-20 DIAGNOSIS — Z95820 Peripheral vascular angioplasty status with implants and grafts: Secondary | ICD-10-CM

## 2020-12-20 DIAGNOSIS — I739 Peripheral vascular disease, unspecified: Secondary | ICD-10-CM | POA: Diagnosis present

## 2020-12-20 DIAGNOSIS — E669 Obesity, unspecified: Secondary | ICD-10-CM | POA: Diagnosis present

## 2020-12-20 DIAGNOSIS — T182XXA Foreign body in stomach, initial encounter: Secondary | ICD-10-CM

## 2020-12-20 DIAGNOSIS — K259 Gastric ulcer, unspecified as acute or chronic, without hemorrhage or perforation: Secondary | ICD-10-CM | POA: Diagnosis present

## 2020-12-20 DIAGNOSIS — D509 Iron deficiency anemia, unspecified: Secondary | ICD-10-CM | POA: Diagnosis present

## 2020-12-20 DIAGNOSIS — Z7982 Long term (current) use of aspirin: Secondary | ICD-10-CM

## 2020-12-20 DIAGNOSIS — T402X5A Adverse effect of other opioids, initial encounter: Secondary | ICD-10-CM | POA: Diagnosis present

## 2020-12-20 DIAGNOSIS — E871 Hypo-osmolality and hyponatremia: Secondary | ICD-10-CM | POA: Diagnosis not present

## 2020-12-20 DIAGNOSIS — I951 Orthostatic hypotension: Secondary | ICD-10-CM | POA: Diagnosis not present

## 2020-12-20 DIAGNOSIS — T184XXA Foreign body in colon, initial encounter: Secondary | ICD-10-CM | POA: Diagnosis present

## 2020-12-20 DIAGNOSIS — N179 Acute kidney failure, unspecified: Principal | ICD-10-CM

## 2020-12-20 DIAGNOSIS — Z79899 Other long term (current) drug therapy: Secondary | ICD-10-CM

## 2020-12-20 DIAGNOSIS — K2980 Duodenitis without bleeding: Secondary | ICD-10-CM | POA: Diagnosis present

## 2020-12-20 DIAGNOSIS — R296 Repeated falls: Secondary | ICD-10-CM | POA: Diagnosis present

## 2020-12-20 DIAGNOSIS — W182XXA Fall in (into) shower or empty bathtub, initial encounter: Secondary | ICD-10-CM | POA: Diagnosis present

## 2020-12-20 DIAGNOSIS — S161XXA Strain of muscle, fascia and tendon at neck level, initial encounter: Secondary | ICD-10-CM

## 2020-12-20 DIAGNOSIS — Z20822 Contact with and (suspected) exposure to covid-19: Secondary | ICD-10-CM | POA: Diagnosis present

## 2020-12-20 DIAGNOSIS — S0990XA Unspecified injury of head, initial encounter: Secondary | ICD-10-CM

## 2020-12-20 DIAGNOSIS — Z87891 Personal history of nicotine dependence: Secondary | ICD-10-CM

## 2020-12-20 DIAGNOSIS — Z955 Presence of coronary angioplasty implant and graft: Secondary | ICD-10-CM

## 2020-12-20 DIAGNOSIS — K219 Gastro-esophageal reflux disease without esophagitis: Secondary | ICD-10-CM | POA: Diagnosis present

## 2020-12-20 DIAGNOSIS — D696 Thrombocytopenia, unspecified: Secondary | ICD-10-CM | POA: Diagnosis present

## 2020-12-20 DIAGNOSIS — I10 Essential (primary) hypertension: Secondary | ICD-10-CM | POA: Diagnosis present

## 2020-12-20 DIAGNOSIS — M48061 Spinal stenosis, lumbar region without neurogenic claudication: Secondary | ICD-10-CM | POA: Diagnosis present

## 2020-12-20 DIAGNOSIS — E860 Dehydration: Secondary | ICD-10-CM | POA: Diagnosis present

## 2020-12-20 DIAGNOSIS — K5909 Other constipation: Secondary | ICD-10-CM | POA: Diagnosis not present

## 2020-12-20 DIAGNOSIS — W19XXXA Unspecified fall, initial encounter: Secondary | ICD-10-CM | POA: Diagnosis present

## 2020-12-20 DIAGNOSIS — I2510 Atherosclerotic heart disease of native coronary artery without angina pectoris: Secondary | ICD-10-CM | POA: Diagnosis present

## 2020-12-20 DIAGNOSIS — I712 Thoracic aortic aneurysm, without rupture: Secondary | ICD-10-CM | POA: Diagnosis present

## 2020-12-20 DIAGNOSIS — S2243XA Multiple fractures of ribs, bilateral, initial encounter for closed fracture: Secondary | ICD-10-CM

## 2020-12-20 DIAGNOSIS — G928 Other toxic encephalopathy: Secondary | ICD-10-CM | POA: Diagnosis present

## 2020-12-20 DIAGNOSIS — D72829 Elevated white blood cell count, unspecified: Secondary | ICD-10-CM | POA: Diagnosis present

## 2020-12-20 DIAGNOSIS — N1831 Chronic kidney disease, stage 3a: Secondary | ICD-10-CM

## 2020-12-20 DIAGNOSIS — M25512 Pain in left shoulder: Secondary | ICD-10-CM

## 2020-12-20 DIAGNOSIS — D62 Acute posthemorrhagic anemia: Secondary | ICD-10-CM | POA: Diagnosis present

## 2020-12-20 DIAGNOSIS — K449 Diaphragmatic hernia without obstruction or gangrene: Secondary | ICD-10-CM | POA: Diagnosis present

## 2020-12-20 DIAGNOSIS — I252 Old myocardial infarction: Secondary | ICD-10-CM

## 2020-12-20 DIAGNOSIS — K2970 Gastritis, unspecified, without bleeding: Secondary | ICD-10-CM | POA: Diagnosis present

## 2020-12-20 DIAGNOSIS — I4821 Permanent atrial fibrillation: Secondary | ICD-10-CM | POA: Diagnosis present

## 2020-12-20 DIAGNOSIS — E785 Hyperlipidemia, unspecified: Secondary | ICD-10-CM | POA: Diagnosis present

## 2020-12-20 DIAGNOSIS — R5381 Other malaise: Secondary | ICD-10-CM

## 2020-12-20 DIAGNOSIS — N189 Chronic kidney disease, unspecified: Secondary | ICD-10-CM | POA: Diagnosis present

## 2020-12-20 DIAGNOSIS — S098XXA Other specified injuries of head, initial encounter: Secondary | ICD-10-CM | POA: Diagnosis present

## 2020-12-20 DIAGNOSIS — I5043 Acute on chronic combined systolic (congestive) and diastolic (congestive) heart failure: Secondary | ICD-10-CM | POA: Diagnosis present

## 2020-12-20 DIAGNOSIS — R52 Pain, unspecified: Secondary | ICD-10-CM

## 2020-12-20 DIAGNOSIS — Z6834 Body mass index (BMI) 34.0-34.9, adult: Secondary | ICD-10-CM

## 2020-12-20 DIAGNOSIS — I4510 Unspecified right bundle-branch block: Secondary | ICD-10-CM | POA: Diagnosis present

## 2020-12-20 DIAGNOSIS — G8929 Other chronic pain: Secondary | ICD-10-CM | POA: Diagnosis present

## 2020-12-20 DIAGNOSIS — T183XXA Foreign body in small intestine, initial encounter: Secondary | ICD-10-CM

## 2020-12-20 DIAGNOSIS — Z7901 Long term (current) use of anticoagulants: Secondary | ICD-10-CM

## 2020-12-20 DIAGNOSIS — I130 Hypertensive heart and chronic kidney disease with heart failure and stage 1 through stage 4 chronic kidney disease, or unspecified chronic kidney disease: Secondary | ICD-10-CM | POA: Diagnosis present

## 2020-12-20 DIAGNOSIS — N1832 Chronic kidney disease, stage 3b: Secondary | ICD-10-CM | POA: Diagnosis present

## 2020-12-20 DIAGNOSIS — X58XXXA Exposure to other specified factors, initial encounter: Secondary | ICD-10-CM | POA: Diagnosis present

## 2020-12-20 NOTE — ED Triage Notes (Addendum)
Patient arrived with EMS from home lost his balance and fell this evening , no LOC , alert and oriented , respirations unlabored , denies head injury , CBG= 138 , reports bilateral shoulder joint pain /no deformity . Family also stated that patient possibly accidentally swallowed his hearing aid yesterday . Patient is taking Eliquis.

## 2020-12-21 ENCOUNTER — Other Ambulatory Visit (HOSPITAL_COMMUNITY): Payer: Medicare Other

## 2020-12-21 ENCOUNTER — Emergency Department (HOSPITAL_COMMUNITY): Payer: Medicare Other

## 2020-12-21 ENCOUNTER — Encounter (HOSPITAL_COMMUNITY): Payer: Self-pay | Admitting: Internal Medicine

## 2020-12-21 DIAGNOSIS — E871 Hypo-osmolality and hyponatremia: Secondary | ICD-10-CM | POA: Diagnosis present

## 2020-12-21 DIAGNOSIS — G8929 Other chronic pain: Secondary | ICD-10-CM | POA: Diagnosis present

## 2020-12-21 DIAGNOSIS — D509 Iron deficiency anemia, unspecified: Secondary | ICD-10-CM | POA: Diagnosis present

## 2020-12-21 DIAGNOSIS — I4821 Permanent atrial fibrillation: Secondary | ICD-10-CM | POA: Diagnosis present

## 2020-12-21 DIAGNOSIS — Z95820 Peripheral vascular angioplasty status with implants and grafts: Secondary | ICD-10-CM

## 2020-12-21 DIAGNOSIS — Z955 Presence of coronary angioplasty implant and graft: Secondary | ICD-10-CM | POA: Diagnosis not present

## 2020-12-21 DIAGNOSIS — I5021 Acute systolic (congestive) heart failure: Secondary | ICD-10-CM | POA: Diagnosis not present

## 2020-12-21 DIAGNOSIS — W182XXA Fall in (into) shower or empty bathtub, initial encounter: Secondary | ICD-10-CM | POA: Diagnosis present

## 2020-12-21 DIAGNOSIS — Z79899 Other long term (current) drug therapy: Secondary | ICD-10-CM | POA: Diagnosis not present

## 2020-12-21 DIAGNOSIS — I10 Essential (primary) hypertension: Secondary | ICD-10-CM

## 2020-12-21 DIAGNOSIS — W19XXXA Unspecified fall, initial encounter: Secondary | ICD-10-CM | POA: Diagnosis present

## 2020-12-21 DIAGNOSIS — N179 Acute kidney failure, unspecified: Secondary | ICD-10-CM | POA: Diagnosis present

## 2020-12-21 DIAGNOSIS — I5043 Acute on chronic combined systolic (congestive) and diastolic (congestive) heart failure: Secondary | ICD-10-CM | POA: Diagnosis present

## 2020-12-21 DIAGNOSIS — I509 Heart failure, unspecified: Secondary | ICD-10-CM

## 2020-12-21 DIAGNOSIS — I252 Old myocardial infarction: Secondary | ICD-10-CM | POA: Diagnosis not present

## 2020-12-21 DIAGNOSIS — Z20822 Contact with and (suspected) exposure to covid-19: Secondary | ICD-10-CM | POA: Diagnosis present

## 2020-12-21 DIAGNOSIS — I130 Hypertensive heart and chronic kidney disease with heart failure and stage 1 through stage 4 chronic kidney disease, or unspecified chronic kidney disease: Secondary | ICD-10-CM | POA: Diagnosis present

## 2020-12-21 DIAGNOSIS — D696 Thrombocytopenia, unspecified: Secondary | ICD-10-CM | POA: Diagnosis present

## 2020-12-21 DIAGNOSIS — I2510 Atherosclerotic heart disease of native coronary artery without angina pectoris: Secondary | ICD-10-CM | POA: Diagnosis present

## 2020-12-21 DIAGNOSIS — S2243XA Multiple fractures of ribs, bilateral, initial encounter for closed fracture: Secondary | ICD-10-CM | POA: Diagnosis present

## 2020-12-21 DIAGNOSIS — Y93E1 Activity, personal bathing and showering: Secondary | ICD-10-CM | POA: Diagnosis not present

## 2020-12-21 DIAGNOSIS — Z7901 Long term (current) use of anticoagulants: Secondary | ICD-10-CM | POA: Diagnosis not present

## 2020-12-21 DIAGNOSIS — X58XXXA Exposure to other specified factors, initial encounter: Secondary | ICD-10-CM | POA: Diagnosis present

## 2020-12-21 DIAGNOSIS — E785 Hyperlipidemia, unspecified: Secondary | ICD-10-CM | POA: Diagnosis present

## 2020-12-21 DIAGNOSIS — E860 Dehydration: Secondary | ICD-10-CM | POA: Diagnosis present

## 2020-12-21 DIAGNOSIS — I4819 Other persistent atrial fibrillation: Secondary | ICD-10-CM | POA: Diagnosis not present

## 2020-12-21 DIAGNOSIS — D62 Acute posthemorrhagic anemia: Secondary | ICD-10-CM | POA: Diagnosis present

## 2020-12-21 DIAGNOSIS — N189 Chronic kidney disease, unspecified: Secondary | ICD-10-CM

## 2020-12-21 DIAGNOSIS — E669 Obesity, unspecified: Secondary | ICD-10-CM | POA: Diagnosis present

## 2020-12-21 DIAGNOSIS — G928 Other toxic encephalopathy: Secondary | ICD-10-CM | POA: Diagnosis present

## 2020-12-21 DIAGNOSIS — Z8546 Personal history of malignant neoplasm of prostate: Secondary | ICD-10-CM | POA: Diagnosis not present

## 2020-12-21 DIAGNOSIS — D72829 Elevated white blood cell count, unspecified: Secondary | ICD-10-CM | POA: Diagnosis present

## 2020-12-21 DIAGNOSIS — Z7982 Long term (current) use of aspirin: Secondary | ICD-10-CM | POA: Diagnosis not present

## 2020-12-21 DIAGNOSIS — T182XXA Foreign body in stomach, initial encounter: Secondary | ICD-10-CM

## 2020-12-21 DIAGNOSIS — K219 Gastro-esophageal reflux disease without esophagitis: Secondary | ICD-10-CM | POA: Diagnosis present

## 2020-12-21 LAB — URINALYSIS, COMPLETE (UACMP) WITH MICROSCOPIC
Bacteria, UA: NONE SEEN
Bilirubin Urine: NEGATIVE
Glucose, UA: NEGATIVE mg/dL
Hgb urine dipstick: NEGATIVE
Ketones, ur: NEGATIVE mg/dL
Leukocytes,Ua: NEGATIVE
Nitrite: NEGATIVE
Protein, ur: 30 mg/dL — AB
Specific Gravity, Urine: 1.015 (ref 1.005–1.030)
pH: 5 (ref 5.0–8.0)

## 2020-12-21 LAB — BASIC METABOLIC PANEL
Anion gap: 12 (ref 5–15)
Anion gap: 10 (ref 5–15)
BUN: 80 mg/dL — ABNORMAL HIGH (ref 8–23)
BUN: 79 mg/dL — ABNORMAL HIGH (ref 8–23)
CO2: 18 mmol/L — ABNORMAL LOW (ref 22–32)
CO2: 20 mmol/L — ABNORMAL LOW (ref 22–32)
Calcium: 10.5 mg/dL — ABNORMAL HIGH (ref 8.9–10.3)
Calcium: 10 mg/dL (ref 8.9–10.3)
Chloride: 92 mmol/L — ABNORMAL LOW (ref 98–111)
Chloride: 91 mmol/L — ABNORMAL LOW (ref 98–111)
Creatinine, Ser: 4.32 mg/dL — ABNORMAL HIGH (ref 0.61–1.24)
Creatinine, Ser: 4.36 mg/dL — ABNORMAL HIGH (ref 0.61–1.24)
GFR, Estimated: 13 mL/min — ABNORMAL LOW (ref >60)
GFR, Estimated: 13 mL/min — ABNORMAL LOW (ref >60)
Glucose, Bld: 107 mg/dL — ABNORMAL HIGH (ref 70–99)
Glucose, Bld: 86 mg/dL (ref 70–99)
Potassium: 4.8 mmol/L (ref 3.5–5.1)
Potassium: 4.6 mmol/L (ref 3.5–5.1)
Sodium: 122 mmol/L — ABNORMAL LOW (ref 135–145)
Sodium: 121 mmol/L — ABNORMAL LOW (ref 135–145)

## 2020-12-21 LAB — COMPREHENSIVE METABOLIC PANEL
ALT: 41 U/L (ref 0–44)
AST: 63 U/L — ABNORMAL HIGH (ref 15–41)
Albumin: 3.2 g/dL — ABNORMAL LOW (ref 3.5–5.0)
Alkaline Phosphatase: 84 U/L (ref 38–126)
Anion gap: 10 (ref 5–15)
BUN: 80 mg/dL — ABNORMAL HIGH (ref 8–23)
CO2: 20 mmol/L — ABNORMAL LOW (ref 22–32)
Calcium: 10.8 mg/dL — ABNORMAL HIGH (ref 8.9–10.3)
Chloride: 92 mmol/L — ABNORMAL LOW (ref 98–111)
Creatinine, Ser: 4.45 mg/dL — ABNORMAL HIGH (ref 0.61–1.24)
GFR, Estimated: 12 mL/min — ABNORMAL LOW (ref >60)
Glucose, Bld: 118 mg/dL — ABNORMAL HIGH (ref 70–99)
Potassium: 4.9 mmol/L (ref 3.5–5.1)
Sodium: 122 mmol/L — ABNORMAL LOW (ref 135–145)
Total Bilirubin: 1.3 mg/dL — ABNORMAL HIGH (ref 0.3–1.2)
Total Protein: 5.8 g/dL — ABNORMAL LOW (ref 6.5–8.1)

## 2020-12-21 LAB — SODIUM, URINE, RANDOM: Sodium, Ur: <10 mmol/L

## 2020-12-21 LAB — TSH: TSH: 2.577 u[IU]/mL (ref 0.350–4.500)

## 2020-12-21 LAB — CBC WITH DIFFERENTIAL/PLATELET
Abs Immature Granulocytes: 0.09 10*3/uL — ABNORMAL HIGH (ref 0.00–0.07)
Basophils Absolute: 0 10*3/uL (ref 0.0–0.1)
Basophils Relative: 0 %
Eosinophils Absolute: 0.3 10*3/uL (ref 0.0–0.5)
Eosinophils Relative: 3 %
HCT: 25.5 % — ABNORMAL LOW (ref 39.0–52.0)
Hemoglobin: 8.7 g/dL — ABNORMAL LOW (ref 13.0–17.0)
Immature Granulocytes: 1 %
Lymphocytes Relative: 10 %
Lymphs Abs: 1.1 10*3/uL (ref 0.7–4.0)
MCH: 31.1 pg (ref 26.0–34.0)
MCHC: 34.1 g/dL (ref 30.0–36.0)
MCV: 91.1 fL (ref 80.0–100.0)
Monocytes Absolute: 0.6 10*3/uL (ref 0.1–1.0)
Monocytes Relative: 5 %
Neutro Abs: 8.3 10*3/uL — ABNORMAL HIGH (ref 1.7–7.7)
Neutrophils Relative %: 81 %
Platelets: 140 10*3/uL — ABNORMAL LOW (ref 150–400)
RBC: 2.8 MIL/uL — ABNORMAL LOW (ref 4.22–5.81)
RDW: 15 % (ref 11.5–15.5)
WBC: 10.3 10*3/uL (ref 4.0–10.5)
nRBC: 0 % (ref 0.0–0.2)

## 2020-12-21 LAB — RESP PANEL BY RT-PCR (FLU A&B, COVID) ARPGX2
Influenza A by PCR: NEGATIVE
Influenza B by PCR: NEGATIVE
SARS Coronavirus 2 by RT PCR: NEGATIVE

## 2020-12-21 LAB — VITAMIN B12: Vitamin B-12: 1156 pg/mL — ABNORMAL HIGH (ref 180–914)

## 2020-12-21 LAB — RENAL FUNCTION PANEL
Albumin: 3 g/dL — ABNORMAL LOW (ref 3.5–5.0)
Anion gap: 9 (ref 5–15)
BUN: 77 mg/dL — ABNORMAL HIGH (ref 8–23)
CO2: 23 mmol/L (ref 22–32)
Calcium: 9.9 mg/dL (ref 8.9–10.3)
Chloride: 91 mmol/L — ABNORMAL LOW (ref 98–111)
Creatinine, Ser: 4.43 mg/dL — ABNORMAL HIGH (ref 0.61–1.24)
GFR, Estimated: 12 mL/min — ABNORMAL LOW (ref >60)
Glucose, Bld: 110 mg/dL — ABNORMAL HIGH (ref 70–99)
Phosphorus: 5.9 mg/dL — ABNORMAL HIGH (ref 2.5–4.6)
Potassium: 4.3 mmol/L (ref 3.5–5.1)
Sodium: 123 mmol/L — ABNORMAL LOW (ref 135–145)

## 2020-12-21 LAB — CK: Total CK: 480 U/L — ABNORMAL HIGH (ref 49–397)

## 2020-12-21 LAB — TYPE AND SCREEN
ABO/RH(D): O POS
Antibody Screen: NEGATIVE

## 2020-12-21 LAB — IRON AND TIBC
Iron: 37 ug/dL — ABNORMAL LOW (ref 45–182)
Saturation Ratios: 15 % — ABNORMAL LOW (ref 17.9–39.5)
TIBC: 244 ug/dL — ABNORMAL LOW (ref 250–450)
UIBC: 207 ug/dL

## 2020-12-21 LAB — OSMOLALITY: Osmolality: 283 mOsm/kg (ref 275–295)

## 2020-12-21 LAB — POC OCCULT BLOOD, ED: Fecal Occult Bld: NEGATIVE

## 2020-12-21 LAB — ABO/RH: ABO/RH(D): O POS

## 2020-12-21 LAB — LIPASE, BLOOD: Lipase: 41 U/L (ref 11–51)

## 2020-12-21 LAB — OSMOLALITY, URINE: Osmolality, Ur: 441 mOsm/kg (ref 300–900)

## 2020-12-21 LAB — BRAIN NATRIURETIC PEPTIDE: B Natriuretic Peptide: 969.4 pg/mL — ABNORMAL HIGH (ref 0.0–100.0)

## 2020-12-21 LAB — TROPONIN I (HIGH SENSITIVITY)
Troponin I (High Sensitivity): 14 ng/L (ref <18)
Troponin I (High Sensitivity): 13 ng/L (ref <18)

## 2020-12-21 LAB — CORTISOL: Cortisol, Plasma: 30.6 ug/dL

## 2020-12-21 LAB — FOLATE: Folate: 8.1 ng/mL (ref >5.9)

## 2020-12-21 IMAGING — CT CT HEAD W/O CM
4 series · 17 of 47 positions shown, 19 images · non-contrast
Comparison: None.

CLINICAL DATA: Fall

EXAM:
CT HEAD WITHOUT CONTRAST
TECHNIQUE: Contiguous axial images were obtained from the base of the skull
through the vertex without intravenous contrast.

[Series 3: head wo · axial · 0.41mm/px · z∈[-152,-27]mm · 6 of 36 slices shown, 8 images]
[im 6/36  brain]
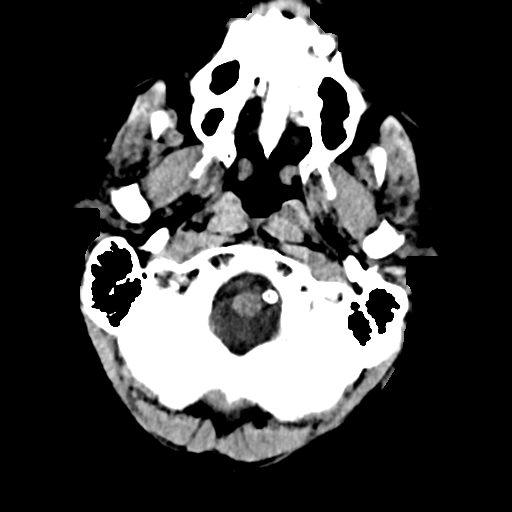
[im 6/36  bone]
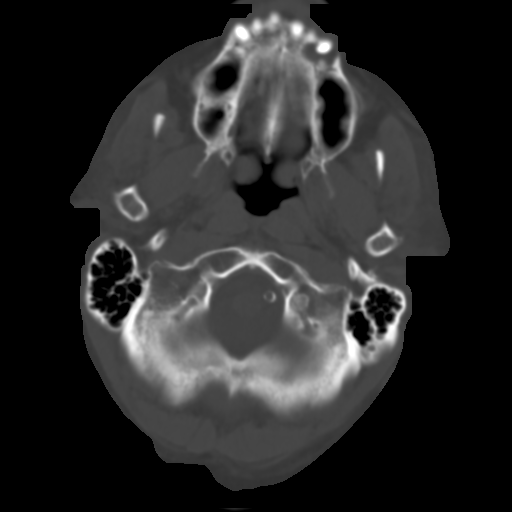
[im 11/36  brain]
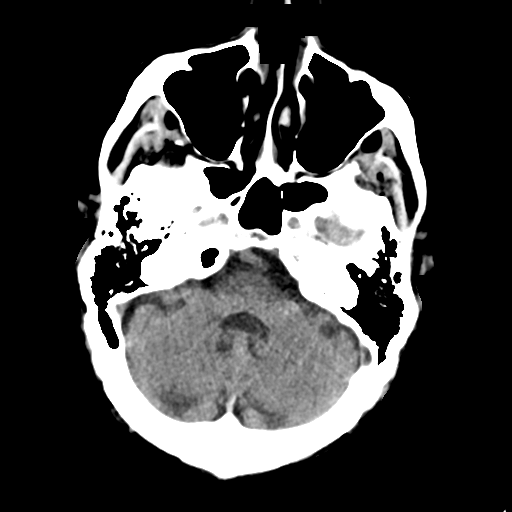
[im 16/36  brain]
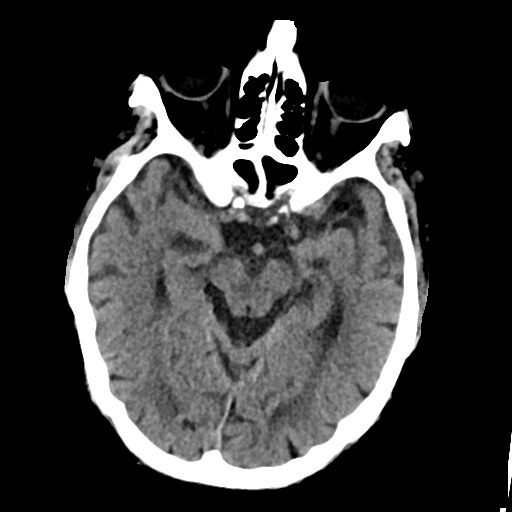
[im 21/36  brain]
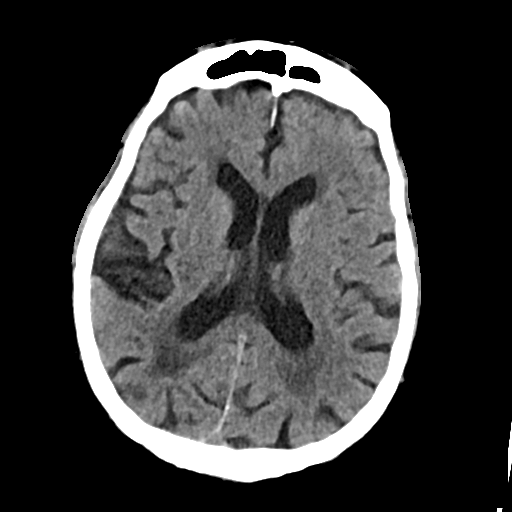
[im 26/36  brain]
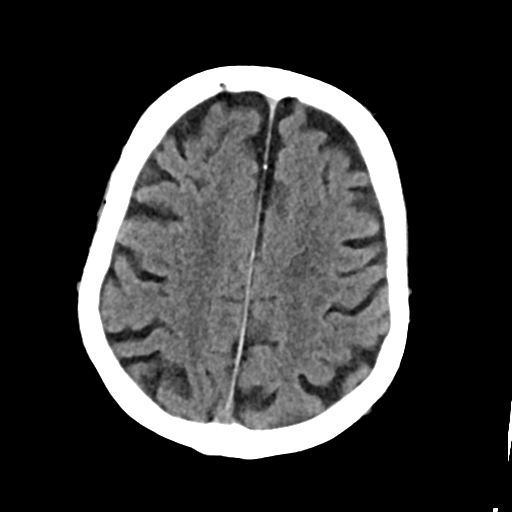
[im 26/36  bone]
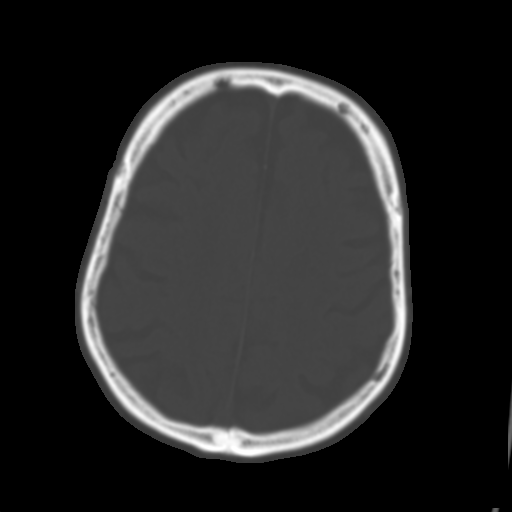
[im 31/36  brain]
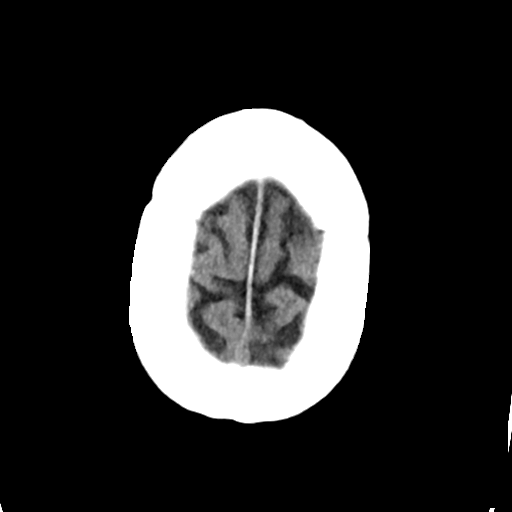

[Series 4: head bone · axial · 0.41mm/px · z∈[-161,-73]mm · 5 of 92 slices shown]
[im 9/92  bone]
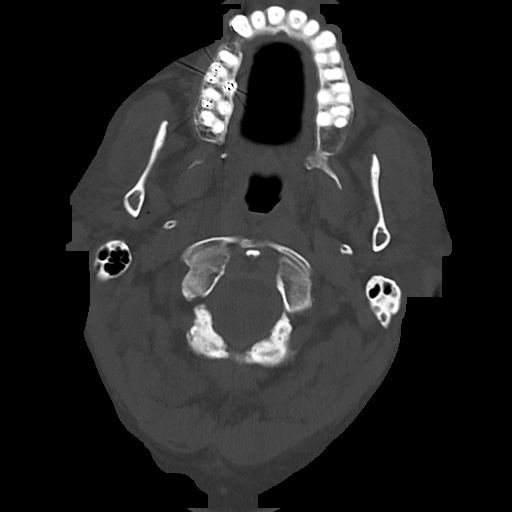
[im 18/92  bone]
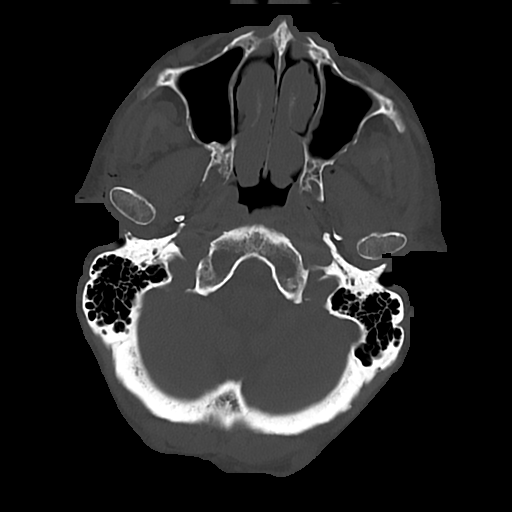
[im 31/92  bone]
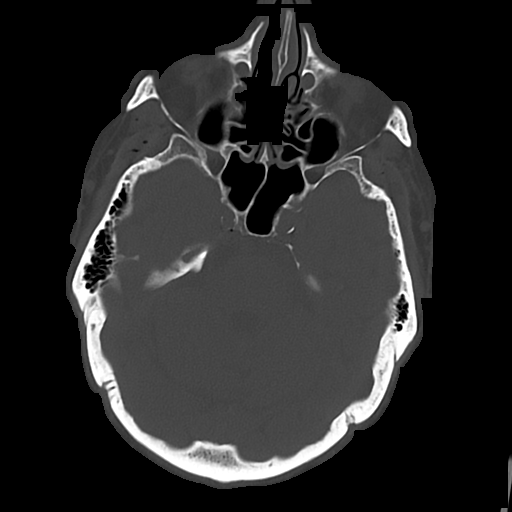
[im 40/92  bone]
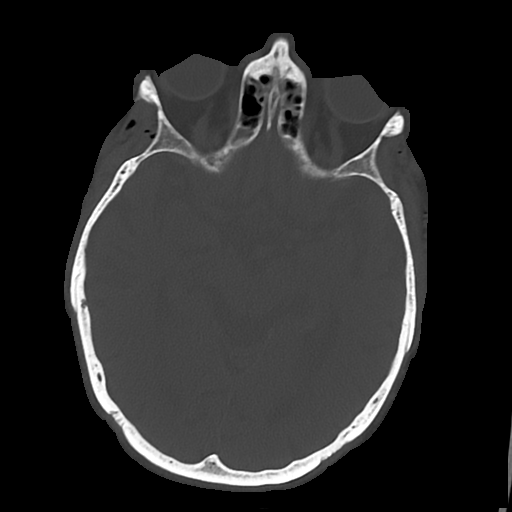
[im 53/92  bone]
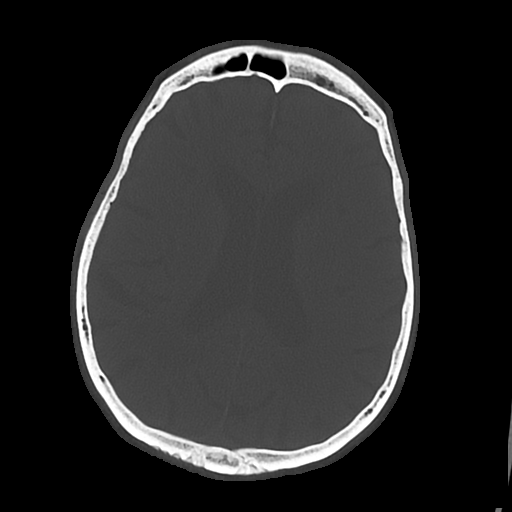

[Series 5: cor soft · coronal · 0.35mm/px · 3 of 73 slices shown]
[im 25/73  brain]
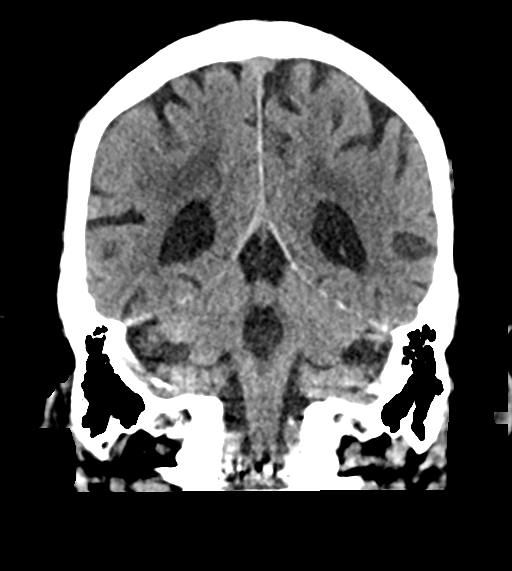
[im 33/73  brain]
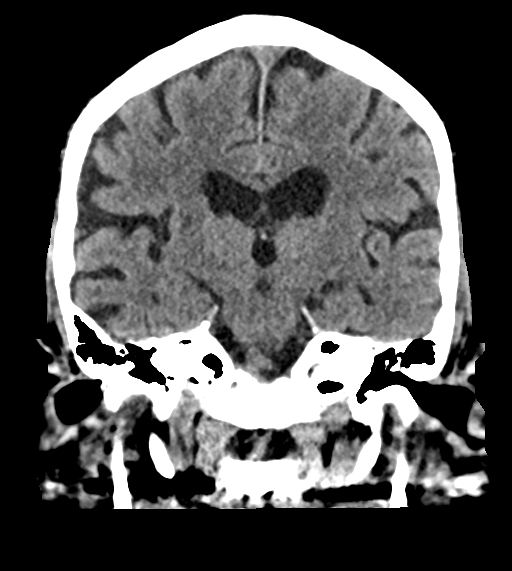
[im 41/73  brain]
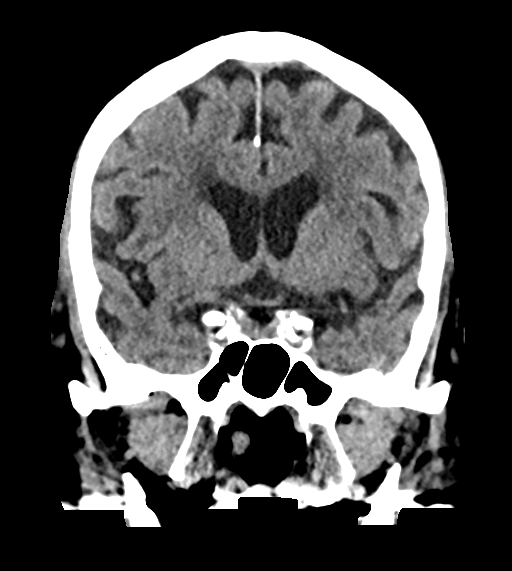

[Series 6: sag soft · sagittal · 0.39mm/px · 3 of 62 slices shown]
[im 21/62  brain]
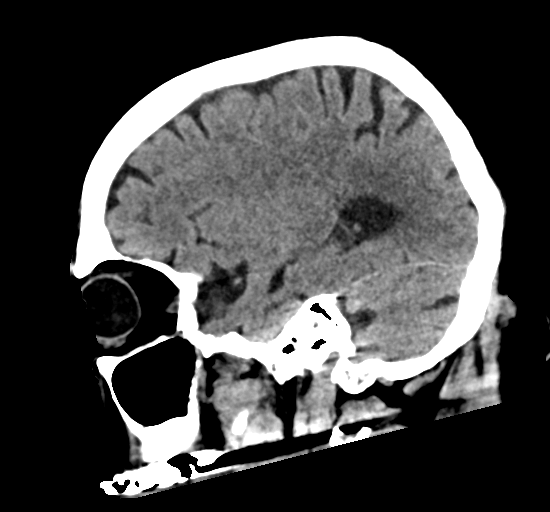
[im 31/62  brain]
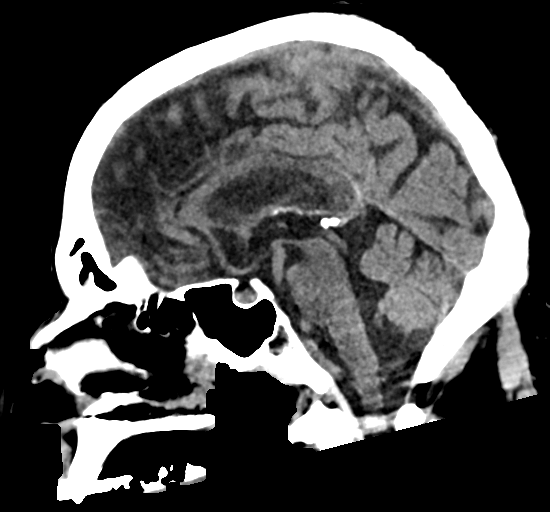
[im 41/62  brain]
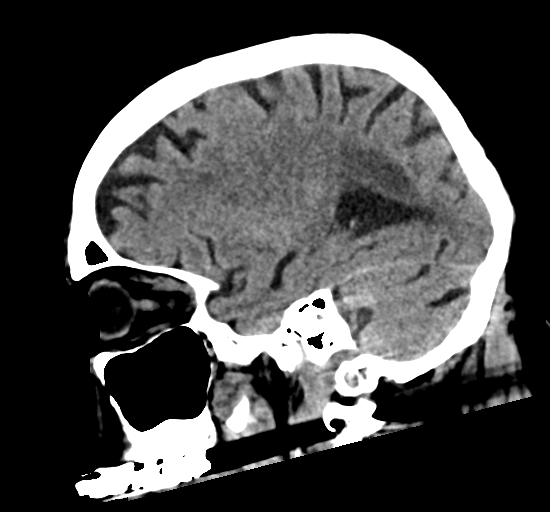

[17 of 47 positions shown; findings below may reference images not displayed]

FINDINGS: Brain: There is atrophy and chronic small vessel disease changes. No
acute intracranial abnormality. Specifically, no hemorrhage,
hydrocephalus, mass lesion, acute infarction, or significant
intracranial injury.

Vascular: No hyperdense vessel or unexpected calcification.

Skull: No acute calvarial abnormality.

Sinuses/Orbits: No acute findings

Other: None
IMPRESSION: Atrophy, chronic microvascular disease.

No acute intracranial abnormality.

## 2020-12-21 IMAGING — CT CT CHEST-ABD-PELV W/O CM
3 of 4 series · 13 of 46 positions shown, 14 images · non-contrast
Comparison: None.

CLINICAL DATA: Acute pain due to trauma. Bilateral shoulder pain.
Possible swallowed foreign body.

EXAM:
CT CHEST, ABDOMEN AND PELVIS WITHOUT CONTRAST
CT THORACIC AND LUMBAR SPINE WITHOUT CONTRAST
TECHNIQUE: Multidetector CT imaging of the chest, abdomen and pelvis was
performed following the standard protocol without IV contrast.
Multiplanar CT images of the thoracic and lumbar spine were
reconstructed from contemporary CT of the Chest, Abdomen, and Pelvis

[Series 3: cap without · axial · non-contrast · 0.74mm/px · z∈[-835,-345]mm · 7 of 132 slices shown, 8 images]
[im 17/132  soft-tissue]
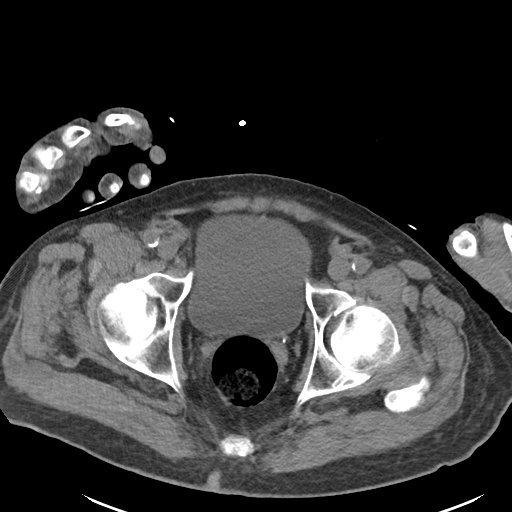
[im 17/132  bone]
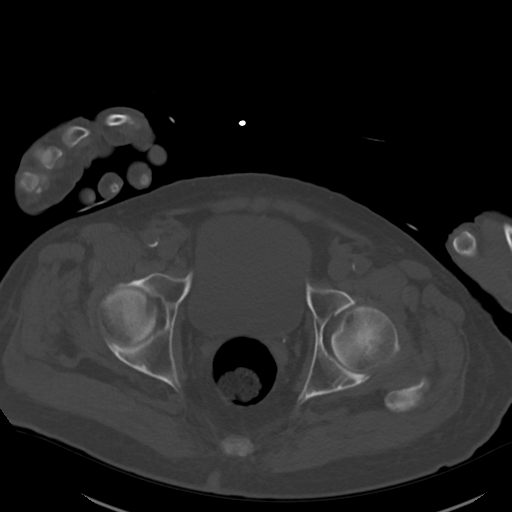
[im 33/132  soft-tissue]
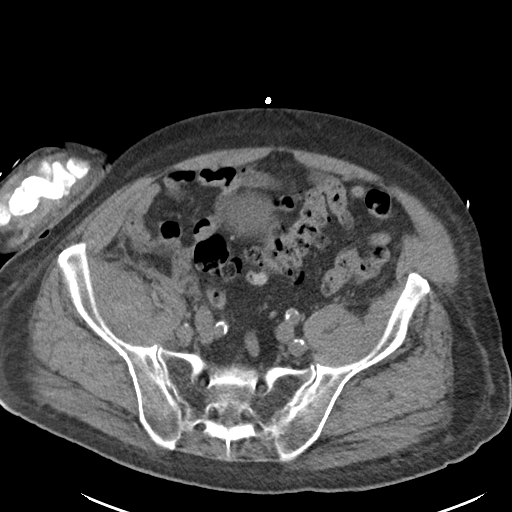
[im 50/132  soft-tissue]
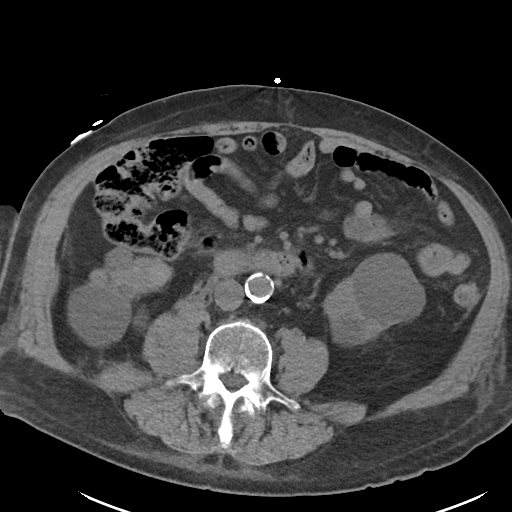
[im 66/132  soft-tissue]
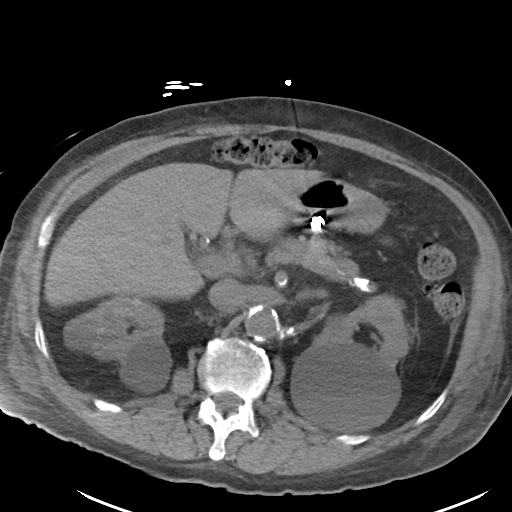
[im 82/132  soft-tissue]
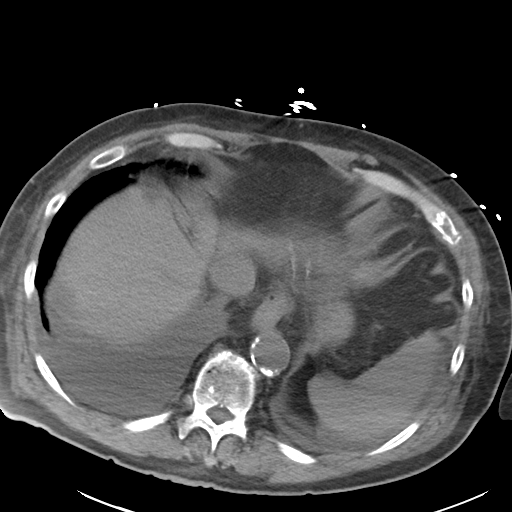
[im 99/132  soft-tissue]
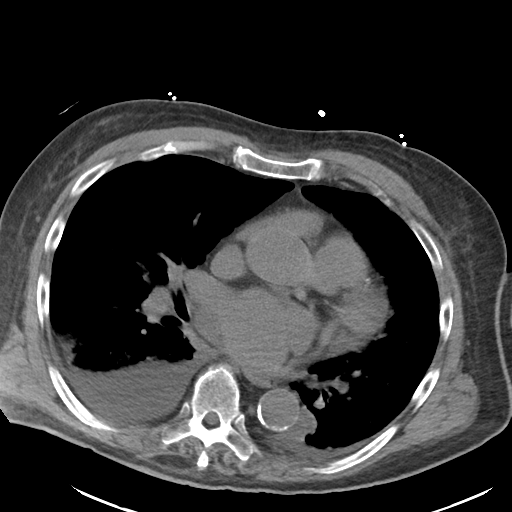
[im 115/132  soft-tissue]
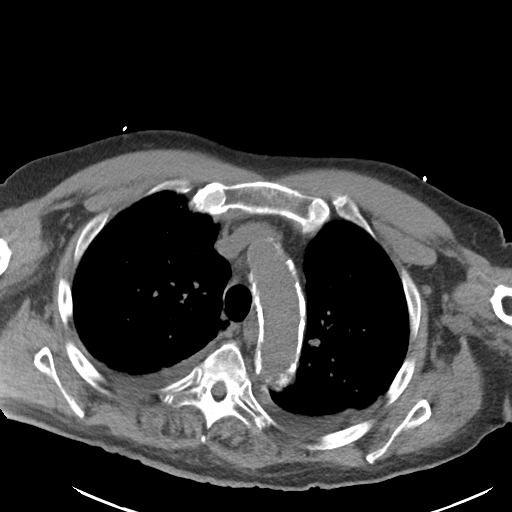

[Series 4: cor · coronal · 0.86mm/px · 3 of 101 slices shown]
[im 34/101  soft-tissue]
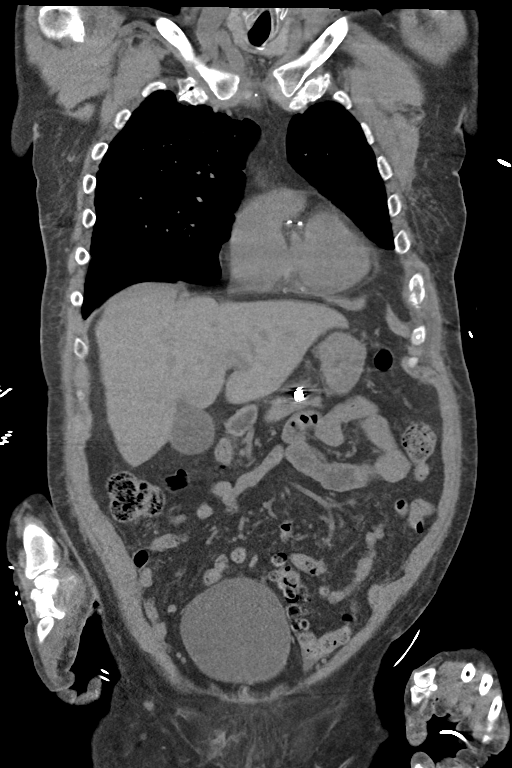
[im 45/101  soft-tissue]
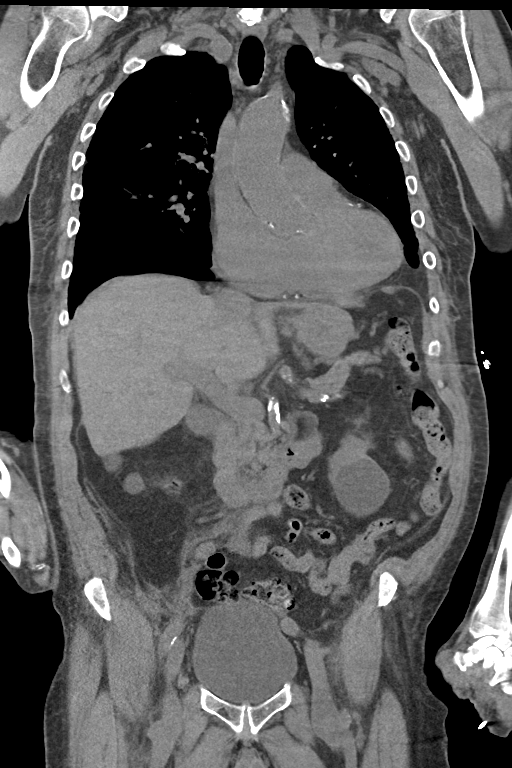
[im 56/101  soft-tissue]
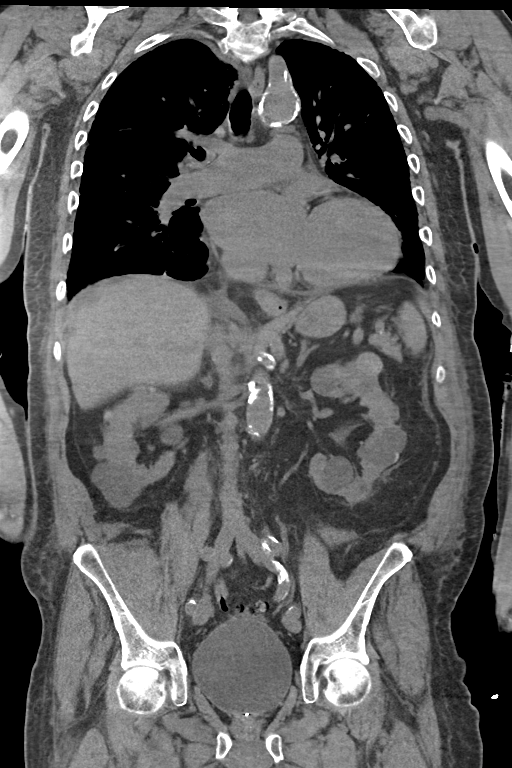

[Series 6: lungs · axial · 0.74mm/px · z∈[-856,-706]mm · 3 of 328 slices shown]
[im 30/328  soft-tissue]
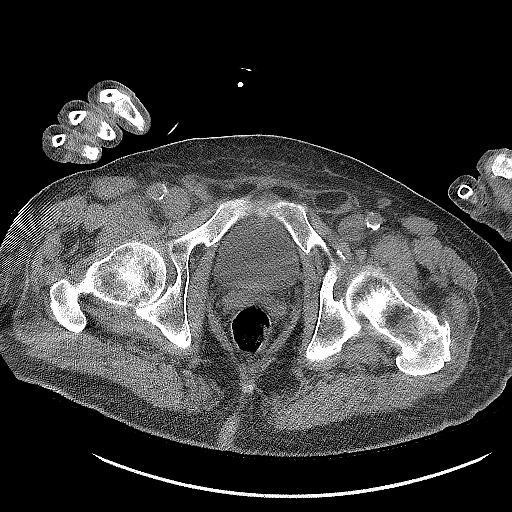
[im 60/328  soft-tissue]
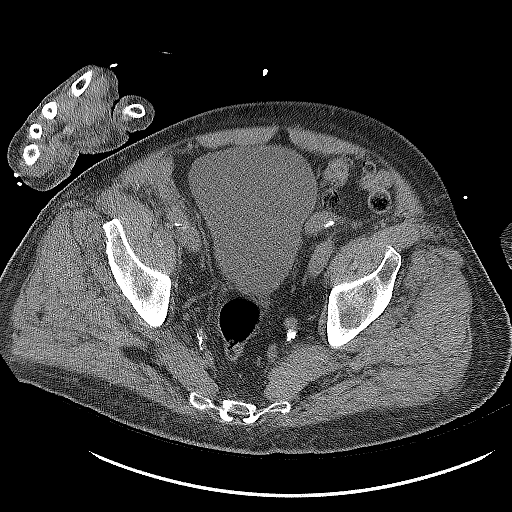
[im 105/328  soft-tissue]
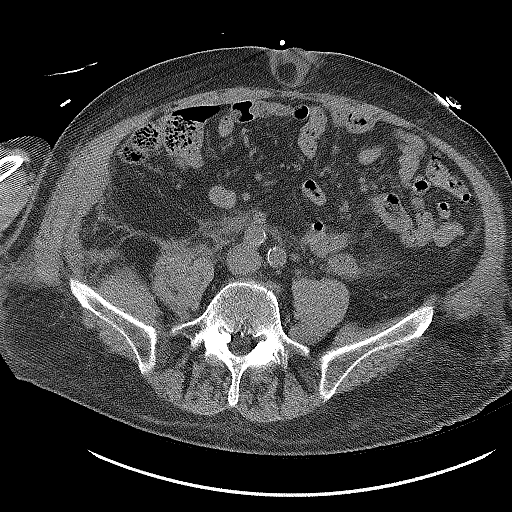

[13 of 46 positions shown; findings below may reference images not displayed]

FINDINGS: CT CHEST FINDINGS

Cardiovascular: Atherosclerotic changes are noted of the thoracic
aorta. There is an ascending thoracic aortic aneurysm measuring
approximately 4.2 cm. The heart size is enlarged. Coronary artery
calcifications are noted. There is a trace pericardial effusion. The
intracardiac blood pool is hypodense relative to the adjacent
myocardium consistent with anemia.

Mediastinum/Nodes:

-- No mediastinal lymphadenopathy.

-- No hilar lymphadenopathy.

-- No axillary lymphadenopathy.

-- No supraclavicular lymphadenopathy.

-- Normal thyroid gland where visualized.

-  Unremarkable esophagus.

Lungs/Pleura: There is a moderate-sized right-sided pleural
effusion. There is a small left-sided pleural effusion. There are
prominent interstitial lung markings bilaterally. No pneumothorax.
There are few scattered ground-glass airspace opacities bilaterally.
There is a 5 mm pulmonary nodule in the left upper lobe (axial
series 6, image 58).

Musculoskeletal: There are acute to subacute appearing anterior rib
fractures on the right involving the third and fourth ribs. There
appears to be a nondisplaced acute fracture involving the anterior
second rib on the left. There are old healed anterior left-sided rib
fractures. There are healing fractures involving the left seventh
eighth ribs anteriorly.

CT ABDOMEN PELVIS FINDINGS

Hepatobiliary: Hepatic cysts are noted. Normal gallbladder.There is
no biliary ductal dilation.

Pancreas: Normal contours without ductal dilatation. No
peripancreatic fluid collection.

Spleen: Unremarkable.

Adrenals/Urinary Tract:

--Adrenal glands: Unremarkable.

--Right kidney/ureter: Multiple complex and simple appearing cysts
are noted involving the right kidney.

--Left kidney/ureter: Multiple complex and simple appearing cysts
are noted on the patient's left. There is an indeterminate exophytic
hyperdense nodule rising from the interpolar region of the left
kidney measuring approximately 2.5 cm. This nodule contains multiple
calcifications (axial series 3, image 72).

--Urinary bladder: Unremarkable.

Stomach/Bowel:

--Stomach/Duodenum: There is a metallic foreign body in the gastric
body measuring approximately 2.1 cm (axial series 3, image 68).

--Small bowel: There appears to be mild wall thickening and possible
adjacent fat stranding involving the proximal duodenum.

--Colon: Rectosigmoid diverticulosis without acute inflammation.

--Appendix: Normal.

Vascular/Lymphatic: Atherosclerotic calcification is present within
the non-aneurysmal abdominal aorta, without hemodynamically
significant stenosis.

--No retroperitoneal lymphadenopathy.

--No mesenteric lymphadenopathy.

--No pelvic or inguinal lymphadenopathy.

Reproductive: Brachytherapy beads are noted

Other: There is nonspecific retroperitoneal fluid along the right
psoas muscle. There is a fat containing umbilical hernia.

Musculoskeletal. No acute displaced fractures.
IMPRESSION: 1. There are bilateral nondisplaced anterior rib fractures without
evidence for pneumothorax.
2. There is a metallic foreign body in the gastric body measuring
approximately 2.1 cm. This may represent the reported swallowed
hearing aid.
3. There appears to be mild wall thickening and possible adjacent
fat stranding involving the proximal duodenum. This may represent
duodenitis/peptic ulcer disease versus pancreatitis. There is a
small volume of retroperitoneal free fluid is felt to be related.
4. There is a moderate-sized right-sided pleural effusion and a
small left-sided pleural effusion.
5. There is a 5 mm pulmonary nodule in the left upper lobe. No
follow-up needed if patient is low-risk. Non-contrast chest CT can
be considered in 12 months if patient is high-risk. This
recommendation follows the consensus statement: Guidelines for
Management of Incidental Pulmonary Nodules Detected on CT Images:
6. There are indeterminate bilateral renal nodules favored to
represent hemorrhagic or proteinaceous cysts. Follow-up with a
nonemergent outpatient renal ultrasound is recommended.
7. Cardiomegaly and coronary artery calcifications. There are
findings suggestive of volume overload and developing pulmonary
edema.
8. Anemia.
9. Ascending thoracic aortic aneurysm measuring 4.2 cm. Recommend
annual imaging followup by CTA or MRA. This recommendation follows
[5V] ACCF/AHA/AATS/ACR/ASA/SCA/AGU/AGU/AGU/AGU Guidelines for the
Diagnosis and Management of Patients with Thoracic Aortic Disease.
Circulation. [5V]; 121: [id number]. Aortic aneurysm NOS ([5V]-[5V])
10. No acute fracture involving the thoracic or lumbar spine.

Aortic Atherosclerosis ([5V]-[5V]).

## 2020-12-21 IMAGING — CT CT CERVICAL SPINE W/O CM
3 of 4 series · 15 of 27 positions shown, 17 images · non-contrast
Comparison: None.

CLINICAL DATA: Fall

EXAM:
CT CERVICAL SPINE WITHOUT CONTRAST
TECHNIQUE: Multidetector CT imaging of the cervical spine was performed without
intravenous contrast. Multiplanar CT image reconstructions were also
generated.

[Series 5: sag bone · sagittal · 0.27mm/px · 5 of 84 slices shown]
[im 14/84  bone]
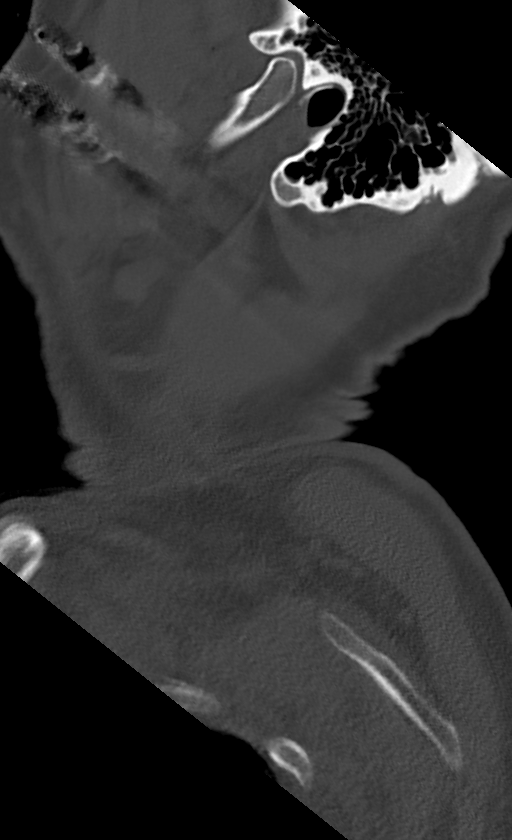
[im 28/84  bone]
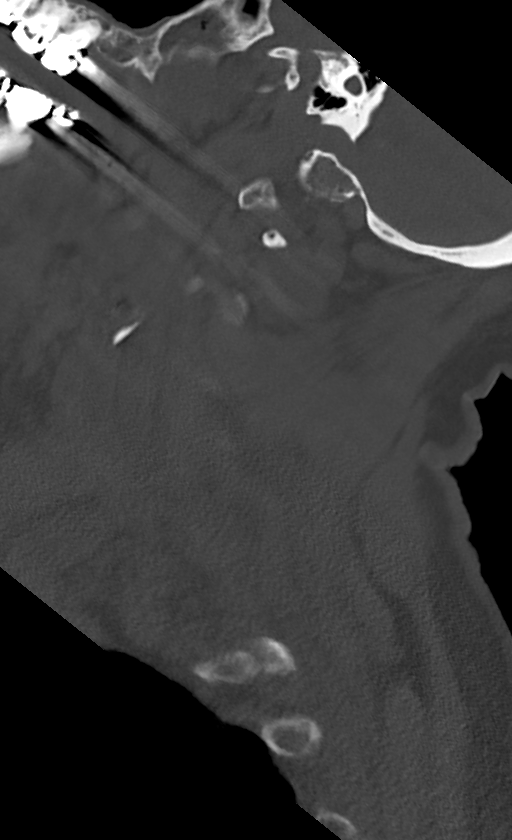
[im 42/84  bone]
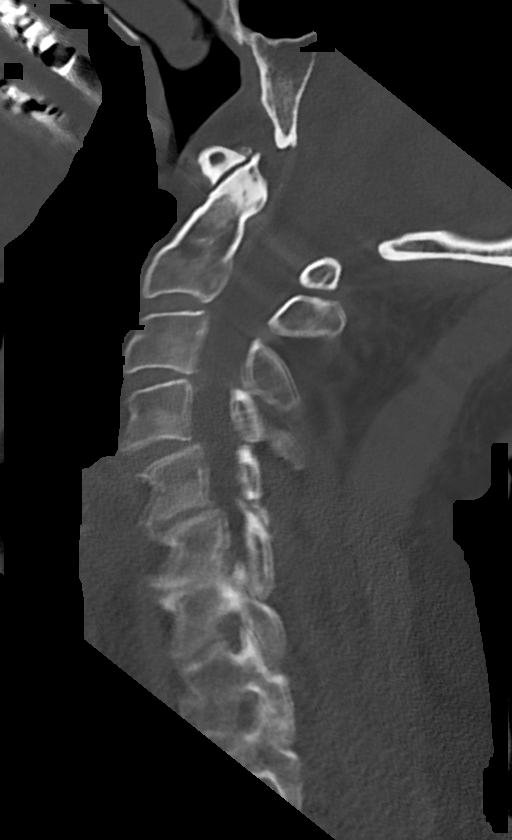
[im 56/84  bone]
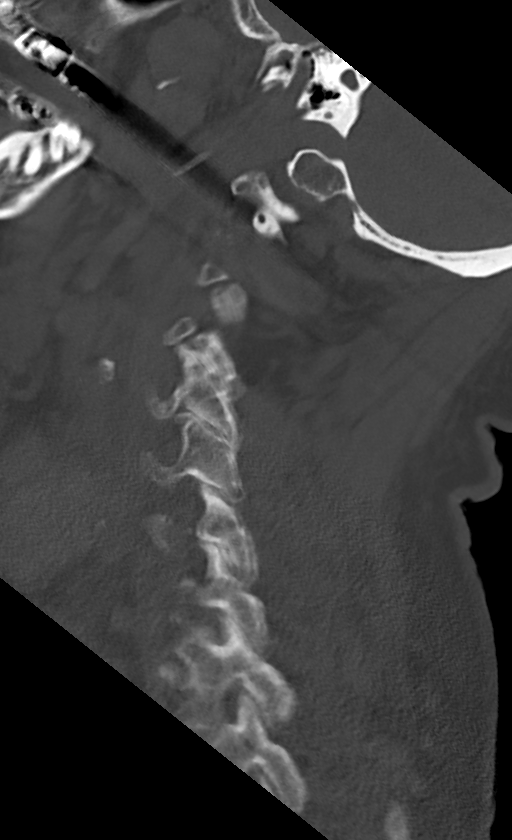
[im 70/84  bone]
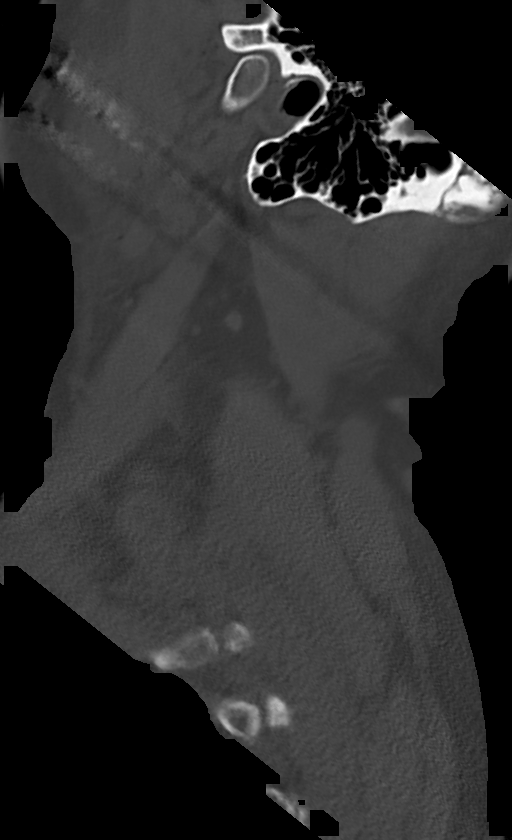

[Series 10: c spine soft · axial · 0.43mm/px · z∈[-272,-200]mm · 4 of 60 slices shown (1 of 2)]
[im 12/60  soft-tissue]
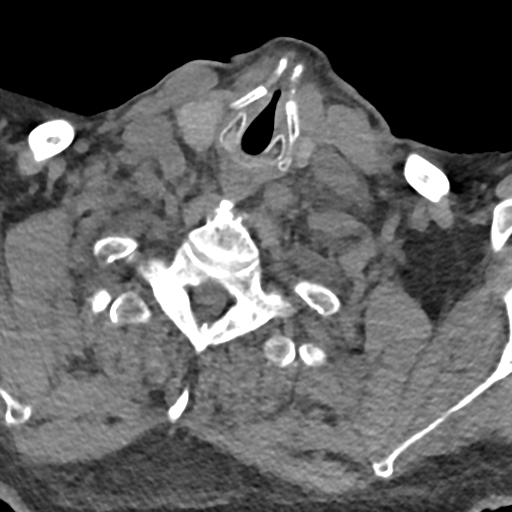
[im 24/60  soft-tissue]
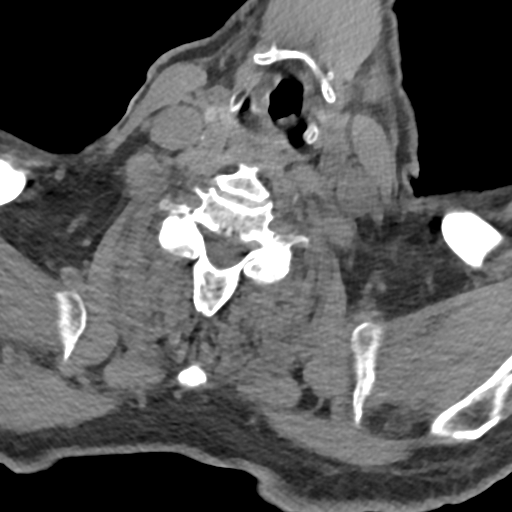
[im 36/60  soft-tissue]
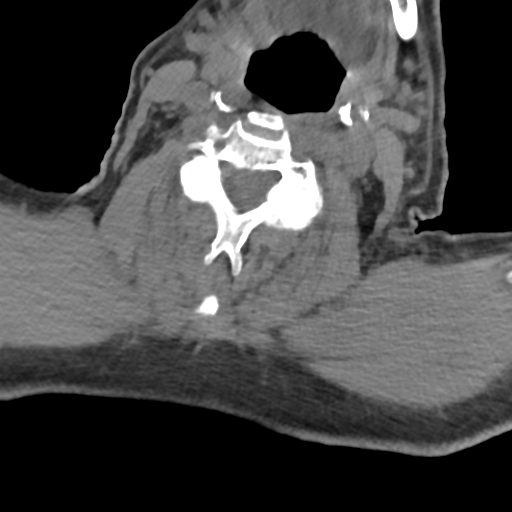
[im 48/60  soft-tissue]
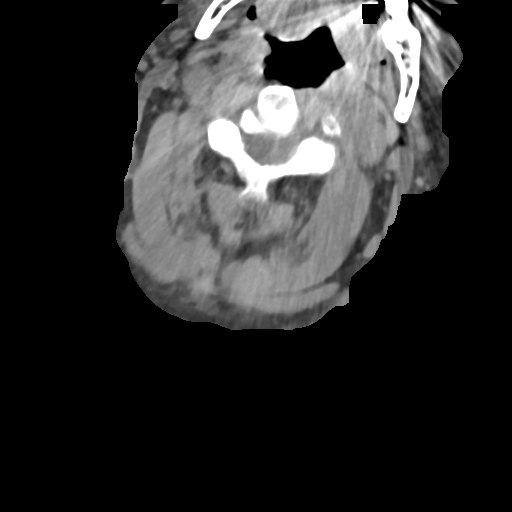

[Series 14: c spine soft · axial · 0.43mm/px · z∈[-267,-147]mm · 6 of 83 slices shown, 8 images (2 of 2)]
[im 12/83  soft-tissue]
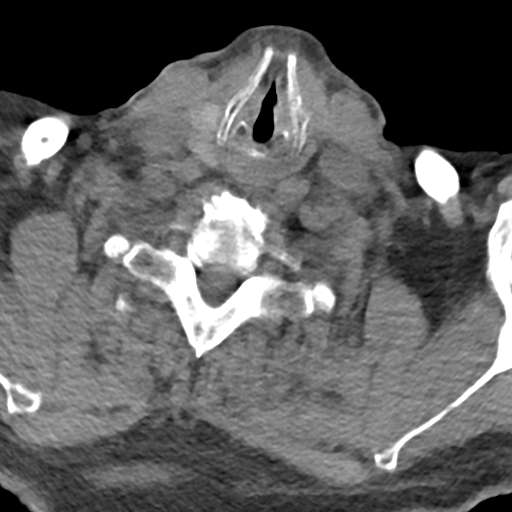
[im 12/83  bone]
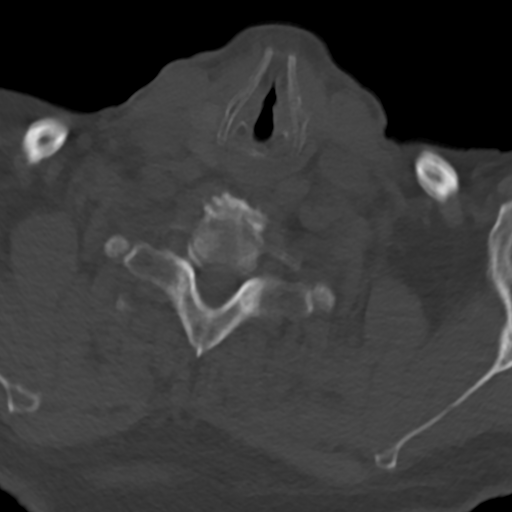
[im 24/83  bone]
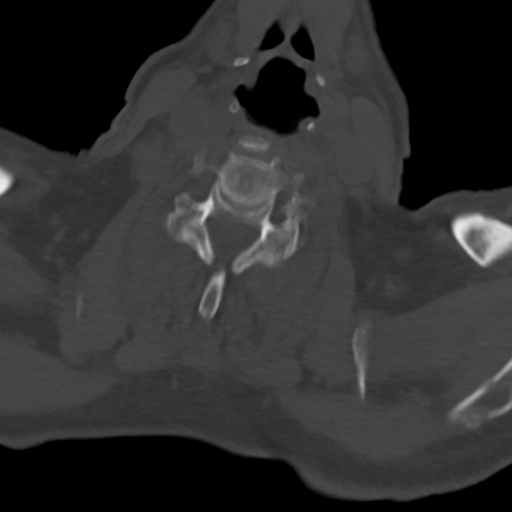
[im 36/83  bone]
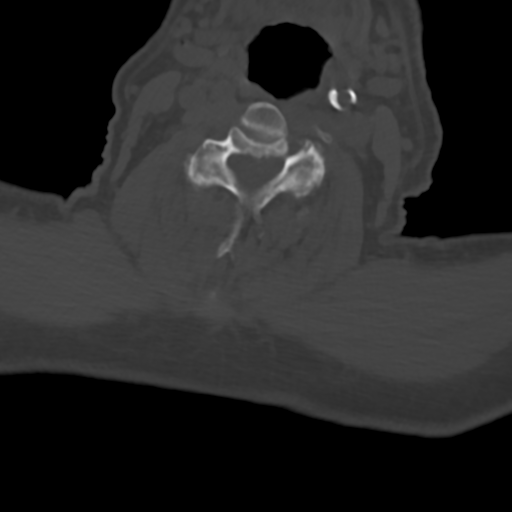
[im 47/83  bone]
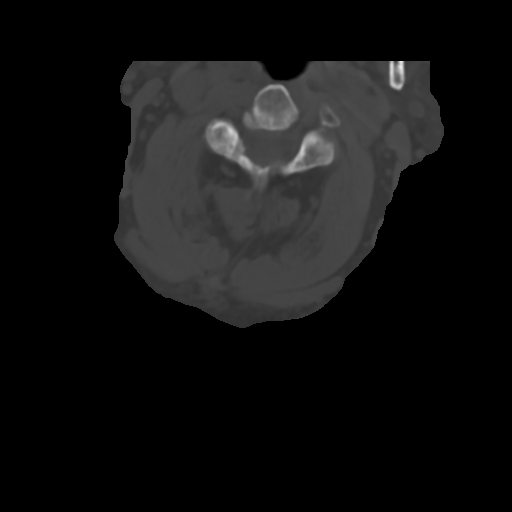
[im 59/83  soft-tissue]
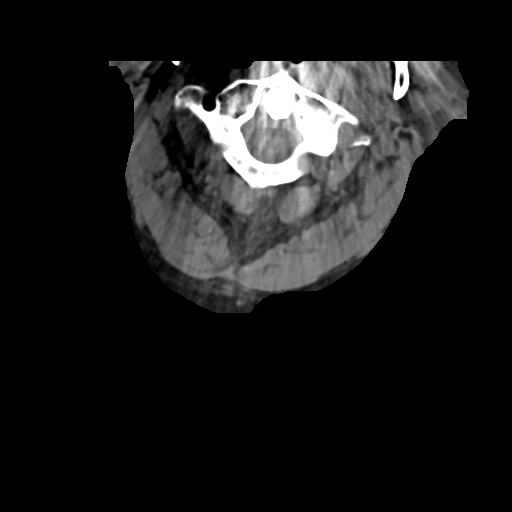
[im 59/83  bone]
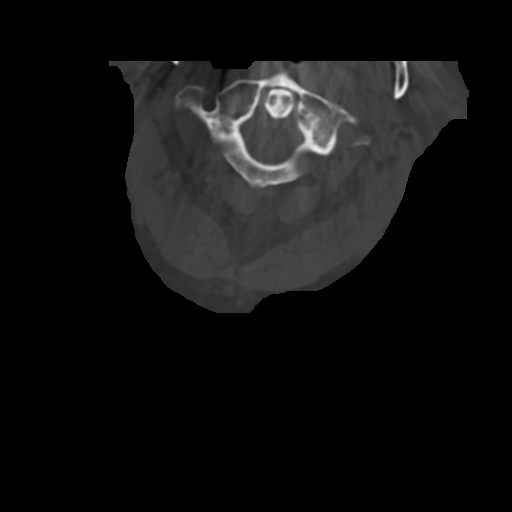
[im 71/83  bone]
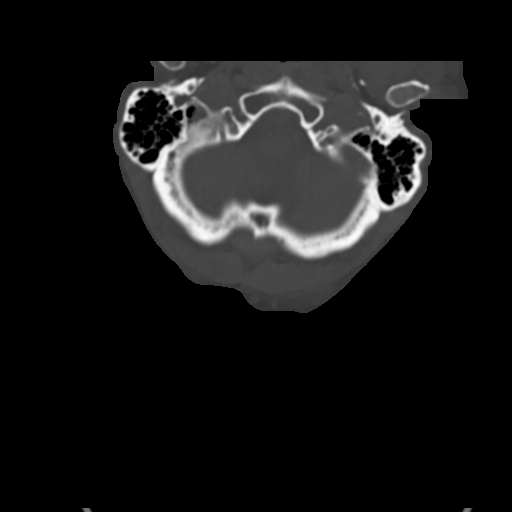

[15 of 27 positions shown; findings below may reference images not displayed]

FINDINGS: Alignment: Slight anterolisthesis of C4 on C5 related to facet
disease.

Skull base and vertebrae: No acute fracture. No primary bone lesion
or focal pathologic process.

Soft tissues and spinal canal: No prevertebral fluid or swelling. No
visible canal hematoma.

Disc levels: Diffuse degenerative disc disease, most pronounced in
the lower lumbar spine. Diffuse degenerative facet disease
bilaterally.

Upper chest: Right pleural effusion partially imaged.

Other: None
IMPRESSION: Degenerative changes.  No acute bony abnormality.

Right pleural effusion.

## 2020-12-21 IMAGING — CT CT L SPINE W/O CM
3 of 5 series · 8 of 33 positions shown, 9 images · non-contrast
Comparison: None.

CLINICAL DATA: Acute pain due to trauma. Bilateral shoulder pain.
Possible swallowed foreign body.

EXAM:
CT CHEST, ABDOMEN AND PELVIS WITHOUT CONTRAST
CT THORACIC AND LUMBAR SPINE WITHOUT CONTRAST
TECHNIQUE: Multidetector CT imaging of the chest, abdomen and pelvis was
performed following the standard protocol without IV contrast.
Multiplanar CT images of the thoracic and lumbar spine were
reconstructed from contemporary CT of the Chest, Abdomen, and Pelvis

[Series 1: lspine without · axial · non-contrast · 0.33mm/px · z∈[-729,-579]mm · 2 of 65 slices shown, 3 images]
[im 20/65  soft-tissue]
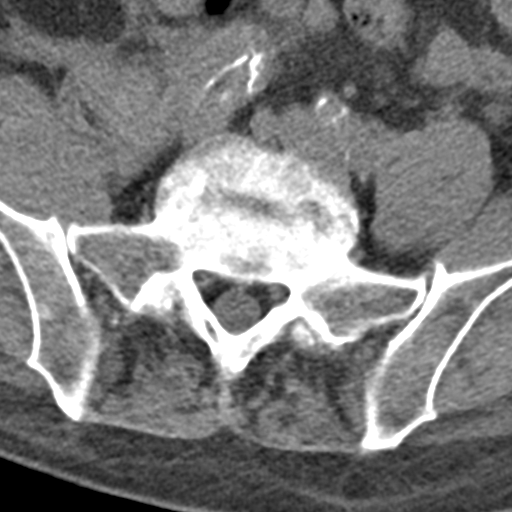
[im 20/65  bone]
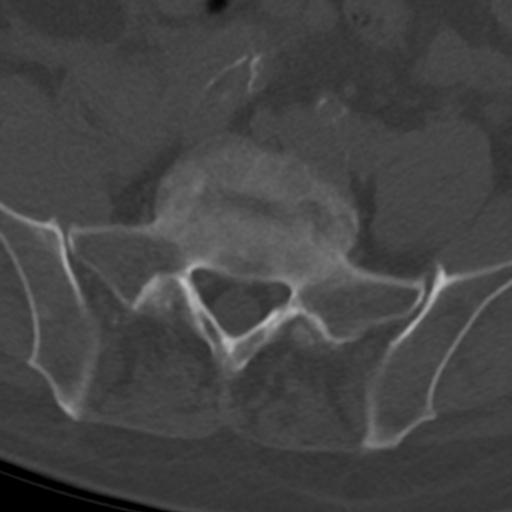
[im 50/65  bone]
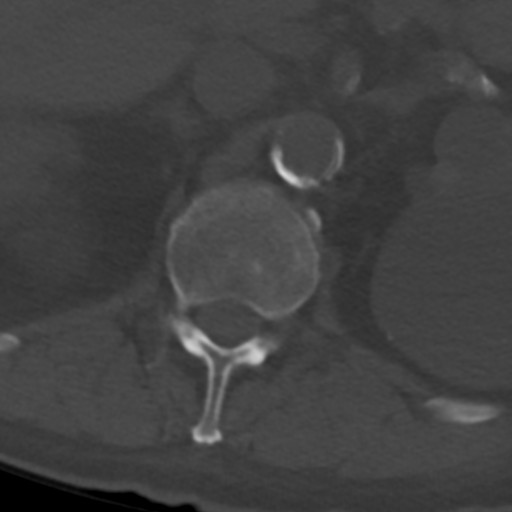

[Series 5: cor lspine st · coronal · 0.34mm/px · 1 of 56 slices shown]
[im 28/56  bone]
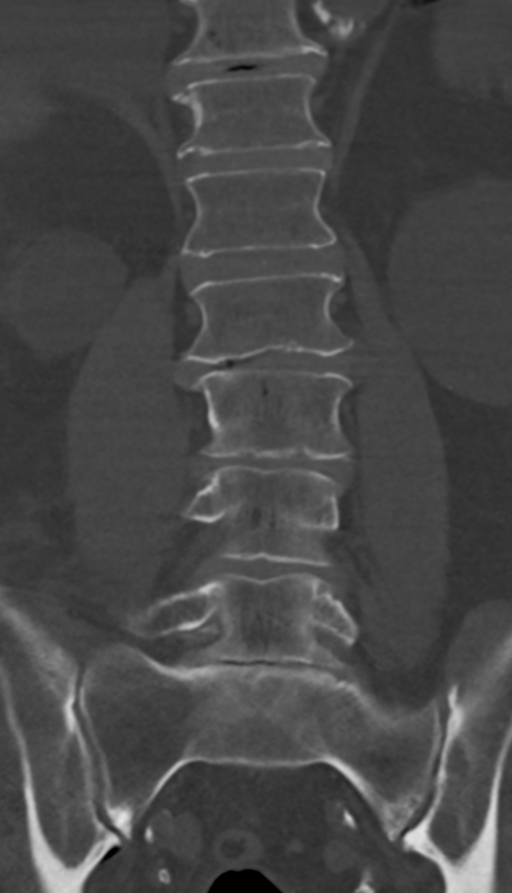

[Series 7: sag lspine st · sagittal · 0.27mm/px · 5 of 50 slices shown]
[im 9/50  bone]
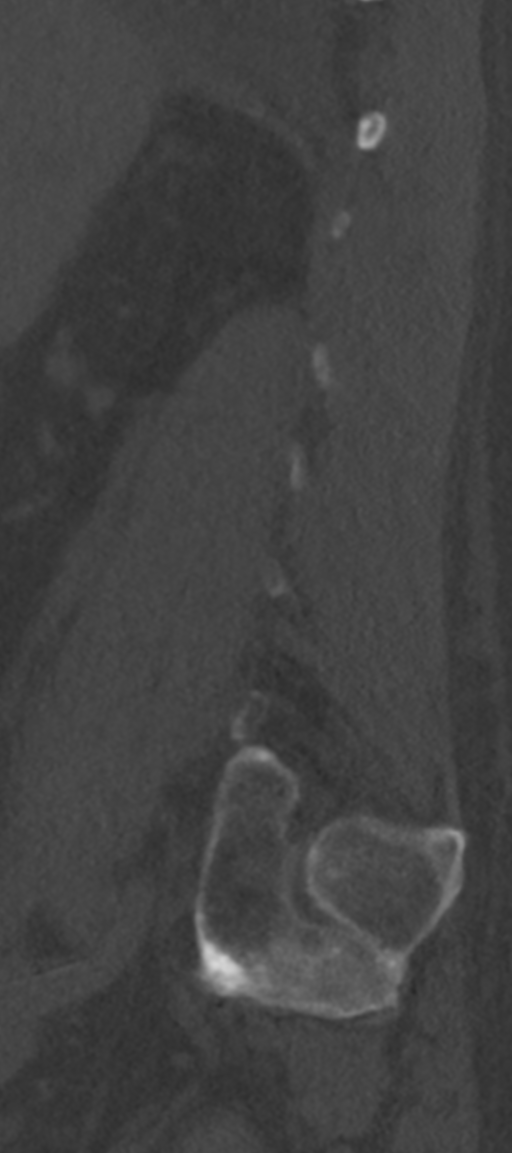
[im 17/50  bone]
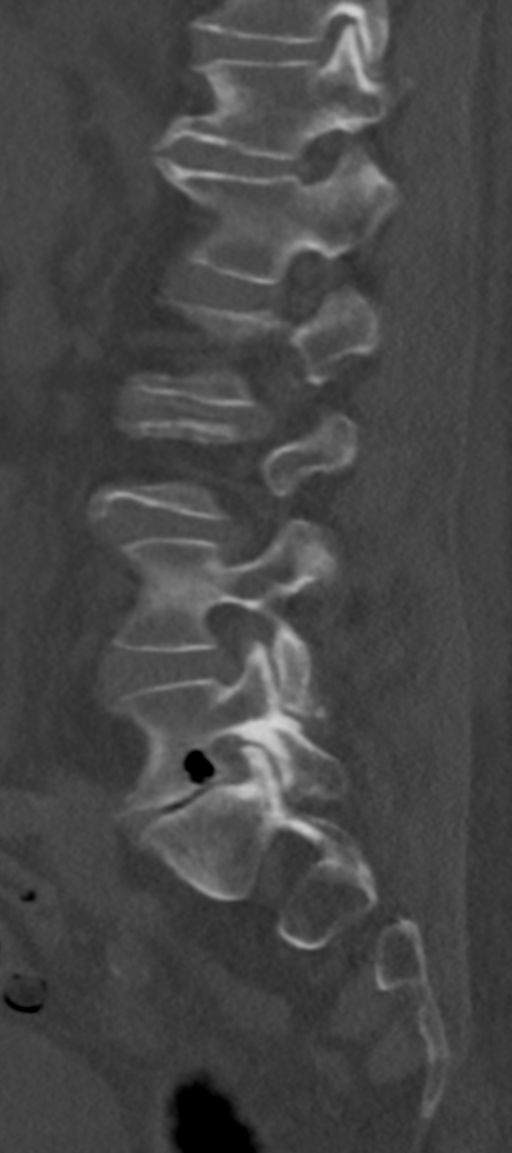
[im 25/50  bone]
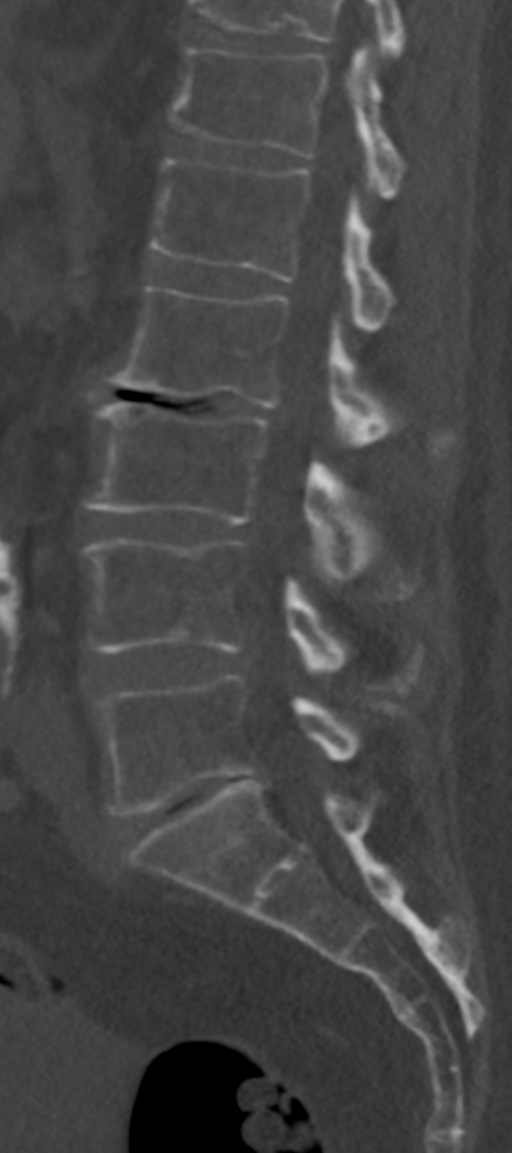
[im 33/50  bone]
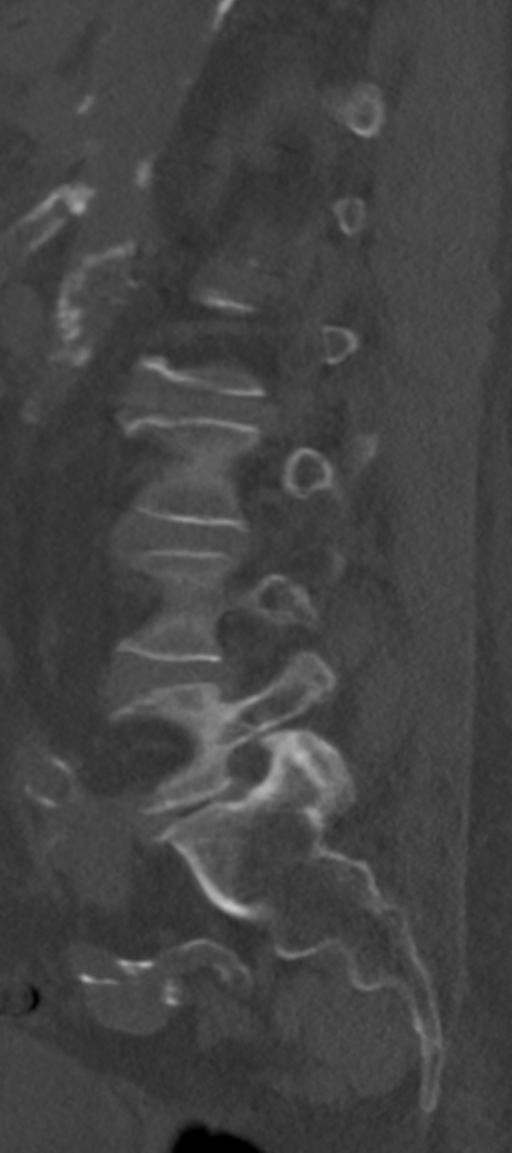
[im 41/50  bone]
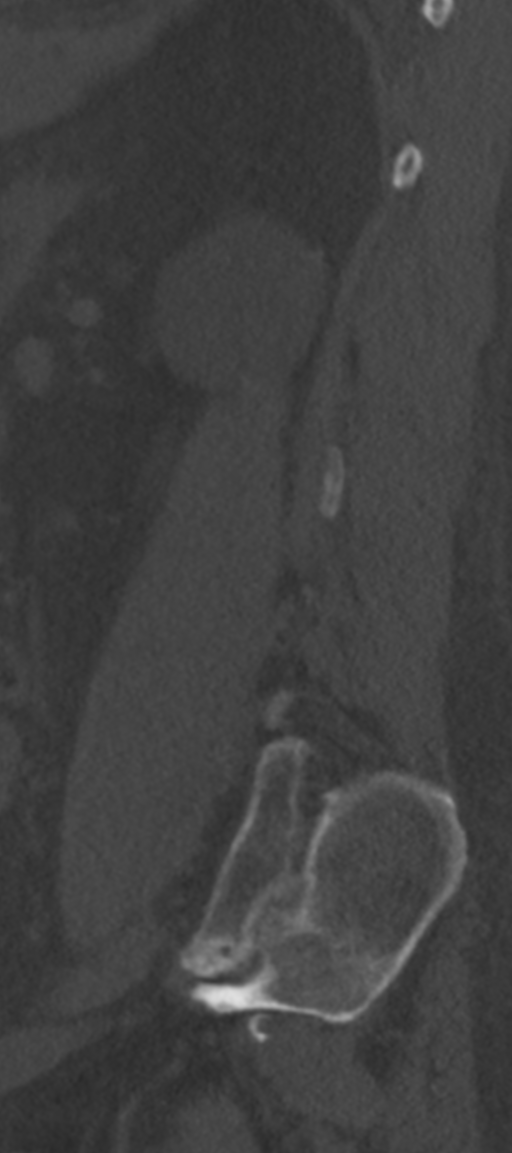

[8 of 33 positions shown; findings below may reference images not displayed]

FINDINGS: CT CHEST FINDINGS

Cardiovascular: Atherosclerotic changes are noted of the thoracic
aorta. There is an ascending thoracic aortic aneurysm measuring
approximately 4.2 cm. The heart size is enlarged. Coronary artery
calcifications are noted. There is a trace pericardial effusion. The
intracardiac blood pool is hypodense relative to the adjacent
myocardium consistent with anemia.

Mediastinum/Nodes:

-- No mediastinal lymphadenopathy.

-- No hilar lymphadenopathy.

-- No axillary lymphadenopathy.

-- No supraclavicular lymphadenopathy.

-- Normal thyroid gland where visualized.

-  Unremarkable esophagus.

Lungs/Pleura: There is a moderate-sized right-sided pleural
effusion. There is a small left-sided pleural effusion. There are
prominent interstitial lung markings bilaterally. No pneumothorax.
There are few scattered ground-glass airspace opacities bilaterally.
There is a 5 mm pulmonary nodule in the left upper lobe (axial
series 6, image 58).

Musculoskeletal: There are acute to subacute appearing anterior rib
fractures on the right involving the third and fourth ribs. There
appears to be a nondisplaced acute fracture involving the anterior
second rib on the left. There are old healed anterior left-sided rib
fractures. There are healing fractures involving the left seventh
eighth ribs anteriorly.

CT ABDOMEN PELVIS FINDINGS

Hepatobiliary: Hepatic cysts are noted. Normal gallbladder.There is
no biliary ductal dilation.

Pancreas: Normal contours without ductal dilatation. No
peripancreatic fluid collection.

Spleen: Unremarkable.

Adrenals/Urinary Tract:

--Adrenal glands: Unremarkable.

--Right kidney/ureter: Multiple complex and simple appearing cysts
are noted involving the right kidney.

--Left kidney/ureter: Multiple complex and simple appearing cysts
are noted on the patient's left. There is an indeterminate exophytic
hyperdense nodule rising from the interpolar region of the left
kidney measuring approximately 2.5 cm. This nodule contains multiple
calcifications (axial series 3, image 72).

--Urinary bladder: Unremarkable.

Stomach/Bowel:

--Stomach/Duodenum: There is a metallic foreign body in the gastric
body measuring approximately 2.1 cm (axial series 3, image 68).

--Small bowel: There appears to be mild wall thickening and possible
adjacent fat stranding involving the proximal duodenum.

--Colon: Rectosigmoid diverticulosis without acute inflammation.

--Appendix: Normal.

Vascular/Lymphatic: Atherosclerotic calcification is present within
the non-aneurysmal abdominal aorta, without hemodynamically
significant stenosis.

--No retroperitoneal lymphadenopathy.

--No mesenteric lymphadenopathy.

--No pelvic or inguinal lymphadenopathy.

Reproductive: Brachytherapy beads are noted

Other: There is nonspecific retroperitoneal fluid along the right
psoas muscle. There is a fat containing umbilical hernia.

Musculoskeletal. No acute displaced fractures.
IMPRESSION: 1. There are bilateral nondisplaced anterior rib fractures without
evidence for pneumothorax.
2. There is a metallic foreign body in the gastric body measuring
approximately 2.1 cm. This may represent the reported swallowed
hearing aid.
3. There appears to be mild wall thickening and possible adjacent
fat stranding involving the proximal duodenum. This may represent
duodenitis/peptic ulcer disease versus pancreatitis. There is a
small volume of retroperitoneal free fluid is felt to be related.
4. There is a moderate-sized right-sided pleural effusion and a
small left-sided pleural effusion.
5. There is a 5 mm pulmonary nodule in the left upper lobe. No
follow-up needed if patient is low-risk. Non-contrast chest CT can
be considered in 12 months if patient is high-risk. This
recommendation follows the consensus statement: Guidelines for
Management of Incidental Pulmonary Nodules Detected on CT Images:
6. There are indeterminate bilateral renal nodules favored to
represent hemorrhagic or proteinaceous cysts. Follow-up with a
nonemergent outpatient renal ultrasound is recommended.
7. Cardiomegaly and coronary artery calcifications. There are
findings suggestive of volume overload and developing pulmonary
edema.
8. Anemia.
9. Ascending thoracic aortic aneurysm measuring 4.2 cm. Recommend
annual imaging followup by CTA or MRA. This recommendation follows
[5V] ACCF/AHA/AATS/ACR/ASA/SCA/AGU/AGU/AGU/AGU Guidelines for the
Diagnosis and Management of Patients with Thoracic Aortic Disease.
Circulation. [5V]; 121: [id number]. Aortic aneurysm NOS ([5V]-[5V])
10. No acute fracture involving the thoracic or lumbar spine.

Aortic Atherosclerosis ([5V]-[5V]).

## 2020-12-21 IMAGING — CT CT T SPINE W/O CM
3 of 5 series · 8 of 33 positions shown, 9 images · non-contrast
Comparison: None.

CLINICAL DATA: Acute pain due to trauma. Bilateral shoulder pain.
Possible swallowed foreign body.

EXAM:
CT CHEST, ABDOMEN AND PELVIS WITHOUT CONTRAST
CT THORACIC AND LUMBAR SPINE WITHOUT CONTRAST
TECHNIQUE: Multidetector CT imaging of the chest, abdomen and pelvis was
performed following the standard protocol without IV contrast.
Multiplanar CT images of the thoracic and lumbar spine were
reconstructed from contemporary CT of the Chest, Abdomen, and Pelvis

[Series 4: thins · axial · 0.35mm/px · z∈[-486,-346]mm · 2 of 445 slices shown, 3 images]
[im 122/445  soft-tissue]
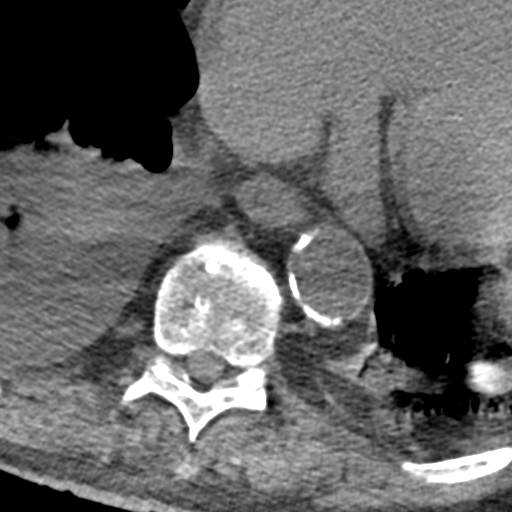
[im 122/445  bone]
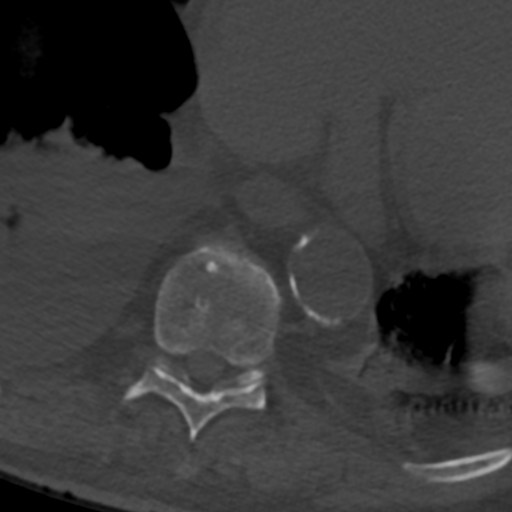
[im 323/445  bone]
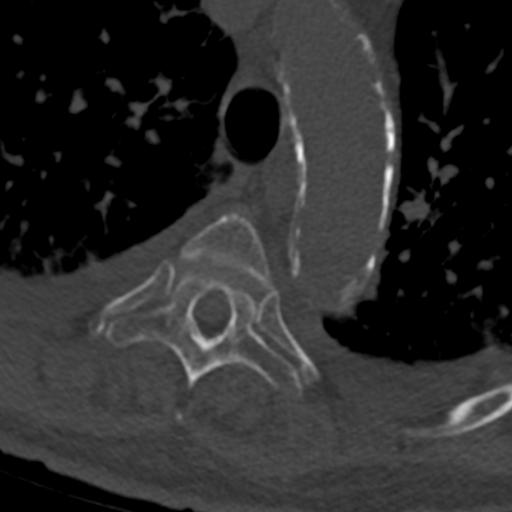

[Series 5: cor tspine bone · coronal · 0.33mm/px · 1 of 73 slices shown]
[im 37/73  bone]
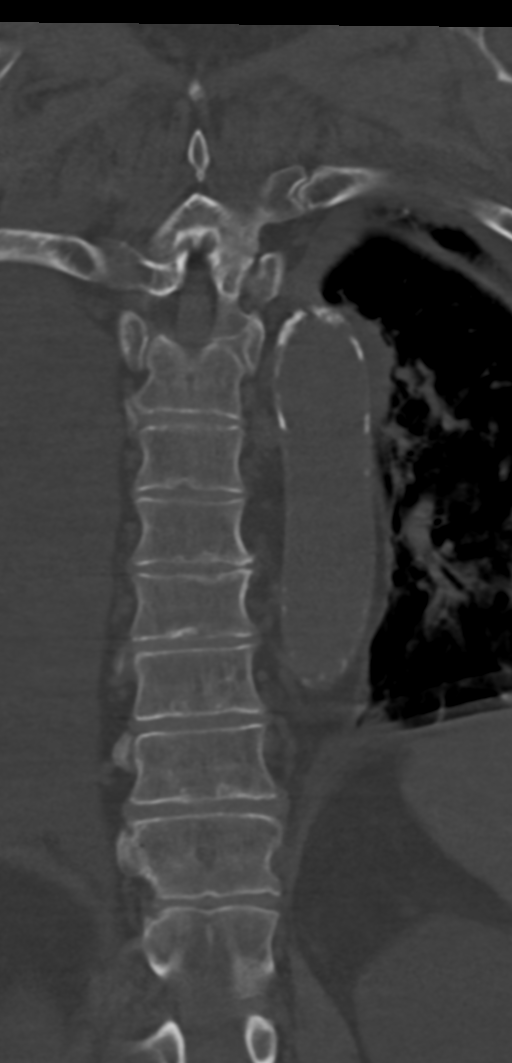

[Series 8: sag tspine st · sagittal · 0.27mm/px · 5 of 50 slices shown]
[im 17/50  bone]
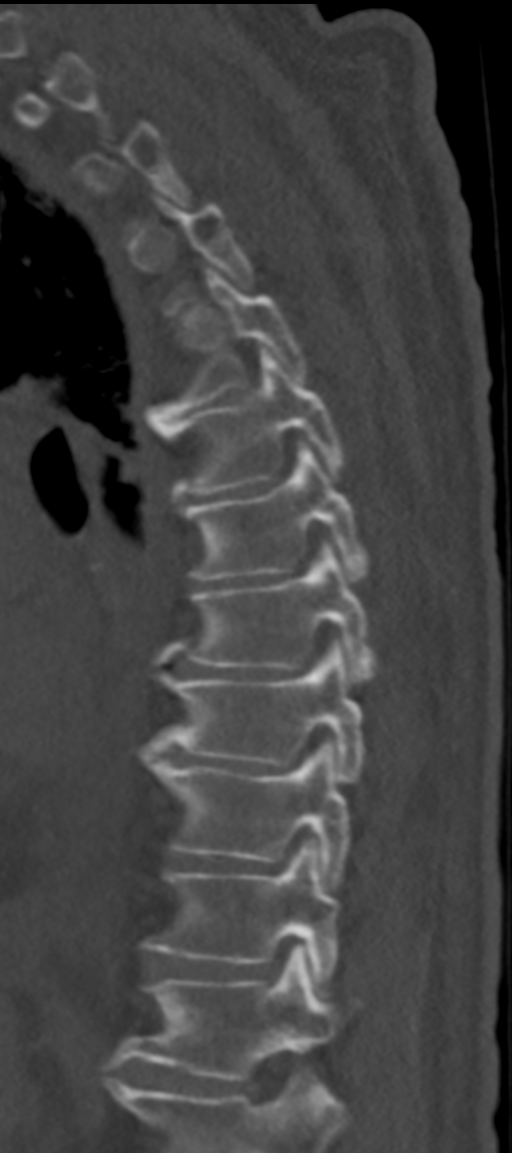
[im 21/50  bone]
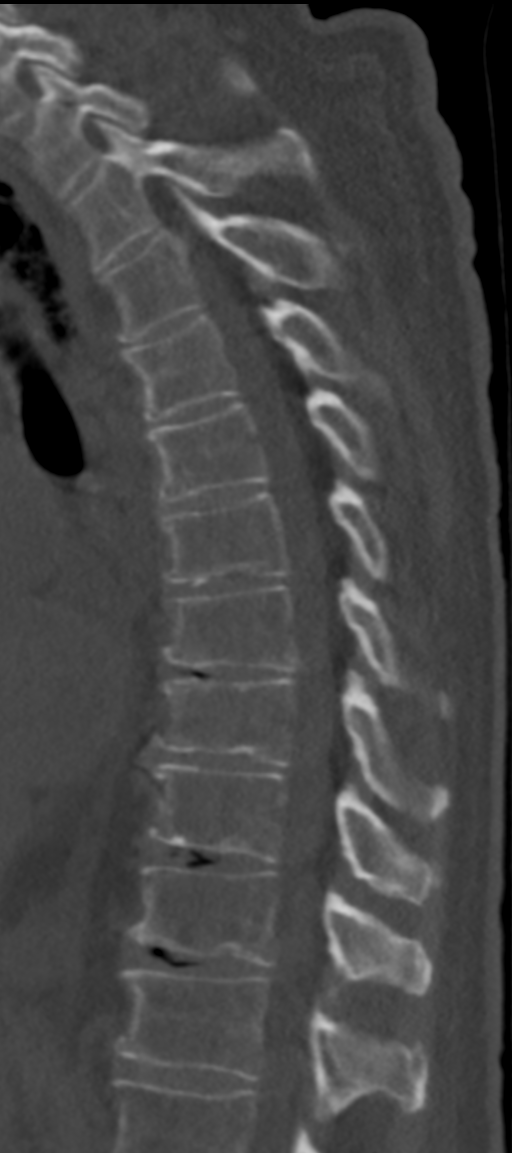
[im 25/50  bone]
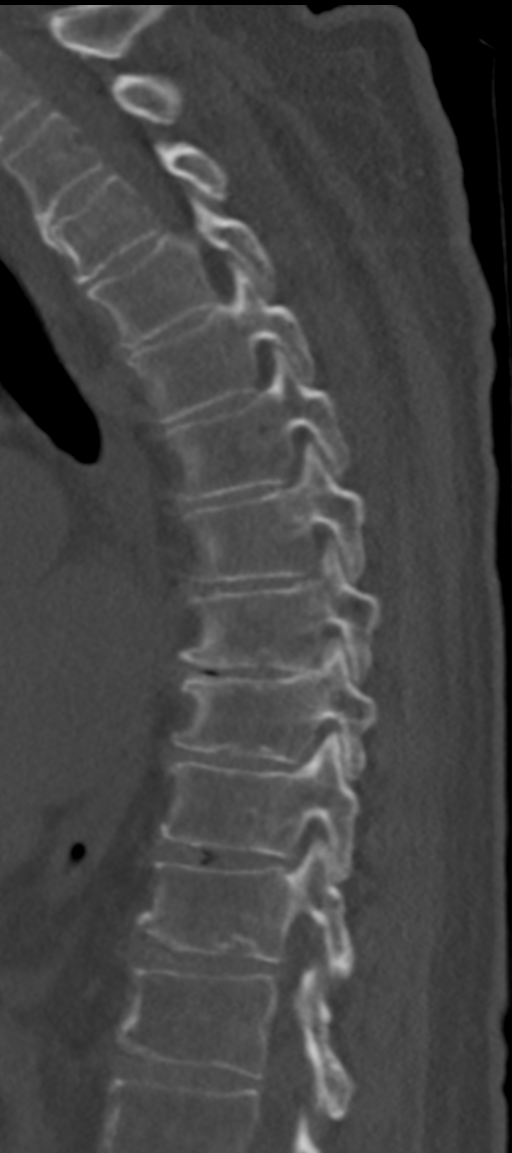
[im 29/50  bone]
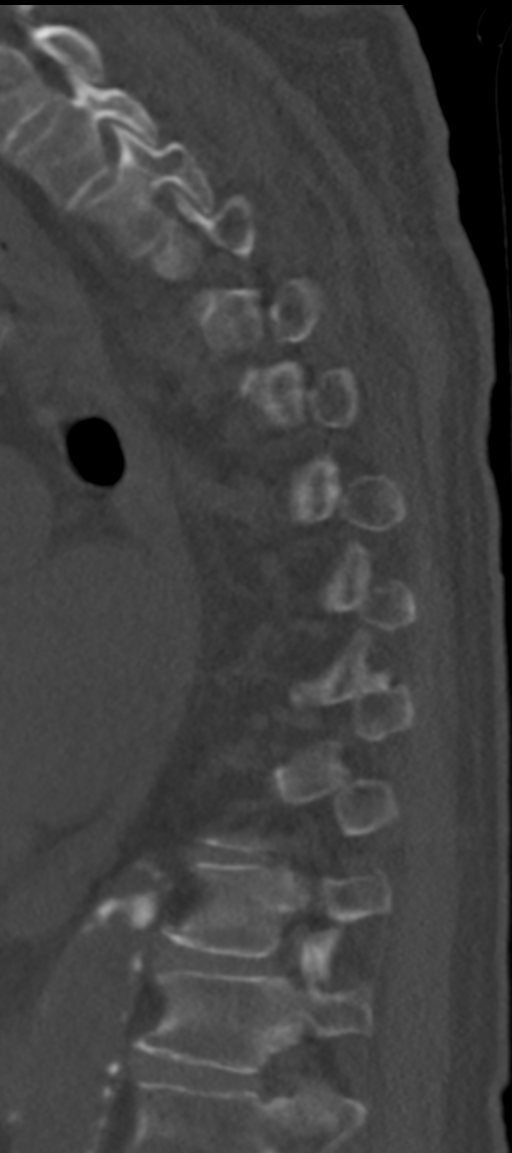
[im 33/50  bone]
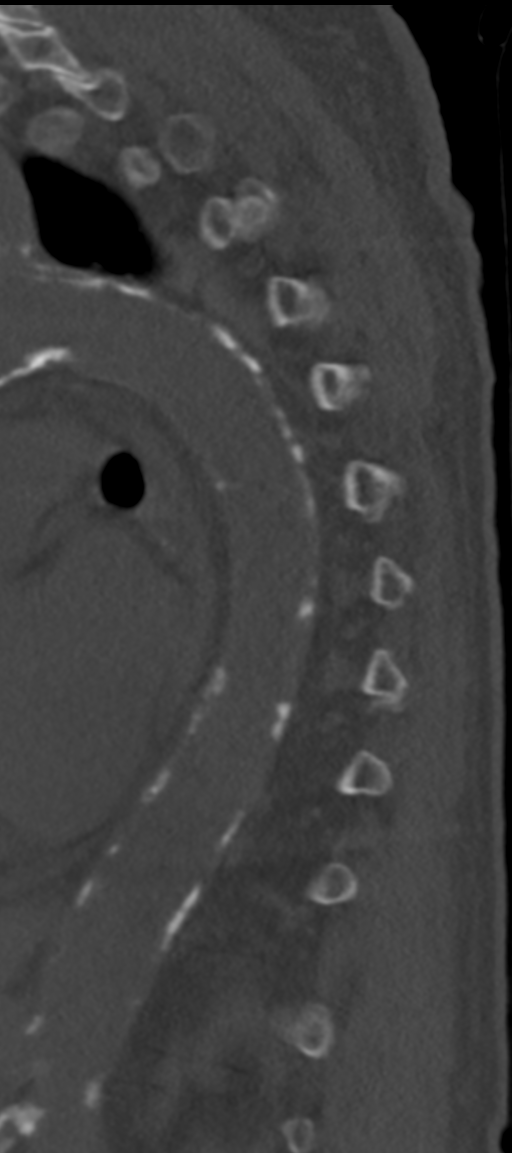

[8 of 33 positions shown; findings below may reference images not displayed]

FINDINGS: CT CHEST FINDINGS

Cardiovascular: Atherosclerotic changes are noted of the thoracic
aorta. There is an ascending thoracic aortic aneurysm measuring
approximately 4.2 cm. The heart size is enlarged. Coronary artery
calcifications are noted. There is a trace pericardial effusion. The
intracardiac blood pool is hypodense relative to the adjacent
myocardium consistent with anemia.

Mediastinum/Nodes:

-- No mediastinal lymphadenopathy.

-- No hilar lymphadenopathy.

-- No axillary lymphadenopathy.

-- No supraclavicular lymphadenopathy.

-- Normal thyroid gland where visualized.

-  Unremarkable esophagus.

Lungs/Pleura: There is a moderate-sized right-sided pleural
effusion. There is a small left-sided pleural effusion. There are
prominent interstitial lung markings bilaterally. No pneumothorax.
There are few scattered ground-glass airspace opacities bilaterally.
There is a 5 mm pulmonary nodule in the left upper lobe (axial
series 6, image 58).

Musculoskeletal: There are acute to subacute appearing anterior rib
fractures on the right involving the third and fourth ribs. There
appears to be a nondisplaced acute fracture involving the anterior
second rib on the left. There are old healed anterior left-sided rib
fractures. There are healing fractures involving the left seventh
eighth ribs anteriorly.

CT ABDOMEN PELVIS FINDINGS

Hepatobiliary: Hepatic cysts are noted. Normal gallbladder.There is
no biliary ductal dilation.

Pancreas: Normal contours without ductal dilatation. No
peripancreatic fluid collection.

Spleen: Unremarkable.

Adrenals/Urinary Tract:

--Adrenal glands: Unremarkable.

--Right kidney/ureter: Multiple complex and simple appearing cysts
are noted involving the right kidney.

--Left kidney/ureter: Multiple complex and simple appearing cysts
are noted on the patient's left. There is an indeterminate exophytic
hyperdense nodule rising from the interpolar region of the left
kidney measuring approximately 2.5 cm. This nodule contains multiple
calcifications (axial series 3, image 72).

--Urinary bladder: Unremarkable.

Stomach/Bowel:

--Stomach/Duodenum: There is a metallic foreign body in the gastric
body measuring approximately 2.1 cm (axial series 3, image 68).

--Small bowel: There appears to be mild wall thickening and possible
adjacent fat stranding involving the proximal duodenum.

--Colon: Rectosigmoid diverticulosis without acute inflammation.

--Appendix: Normal.

Vascular/Lymphatic: Atherosclerotic calcification is present within
the non-aneurysmal abdominal aorta, without hemodynamically
significant stenosis.

--No retroperitoneal lymphadenopathy.

--No mesenteric lymphadenopathy.

--No pelvic or inguinal lymphadenopathy.

Reproductive: Brachytherapy beads are noted

Other: There is nonspecific retroperitoneal fluid along the right
psoas muscle. There is a fat containing umbilical hernia.

Musculoskeletal. No acute displaced fractures.
IMPRESSION: 1. There are bilateral nondisplaced anterior rib fractures without
evidence for pneumothorax.
2. There is a metallic foreign body in the gastric body measuring
approximately 2.1 cm. This may represent the reported swallowed
hearing aid.
3. There appears to be mild wall thickening and possible adjacent
fat stranding involving the proximal duodenum. This may represent
duodenitis/peptic ulcer disease versus pancreatitis. There is a
small volume of retroperitoneal free fluid is felt to be related.
4. There is a moderate-sized right-sided pleural effusion and a
small left-sided pleural effusion.
5. There is a 5 mm pulmonary nodule in the left upper lobe. No
follow-up needed if patient is low-risk. Non-contrast chest CT can
be considered in 12 months if patient is high-risk. This
recommendation follows the consensus statement: Guidelines for
Management of Incidental Pulmonary Nodules Detected on CT Images:
6. There are indeterminate bilateral renal nodules favored to
represent hemorrhagic or proteinaceous cysts. Follow-up with a
nonemergent outpatient renal ultrasound is recommended.
7. Cardiomegaly and coronary artery calcifications. There are
findings suggestive of volume overload and developing pulmonary
edema.
8. Anemia.
9. Ascending thoracic aortic aneurysm measuring 4.2 cm. Recommend
annual imaging followup by CTA or MRA. This recommendation follows
[5V] ACCF/AHA/AATS/ACR/ASA/SCA/AGU/AGU/AGU/AGU Guidelines for the
Diagnosis and Management of Patients with Thoracic Aortic Disease.
Circulation. [5V]; 121: [id number]. Aortic aneurysm NOS ([5V]-[5V])
10. No acute fracture involving the thoracic or lumbar spine.

Aortic Atherosclerosis ([5V]-[5V]).

## 2020-12-21 MED ORDER — LATANOPROST 0.005 % OP SOLN
1.0000 [drp] | Freq: Every day | OPHTHALMIC | Status: DC
  Administered 2020-12-22 – 2021-01-04 (×14): 1 [drp] via OPHTHALMIC
  Filled 2020-12-21 (×2): qty 2.5

## 2020-12-21 MED ORDER — MORPHINE SULFATE (PF) 4 MG/ML IV SOLN
4.0000 mg | Freq: Once | INTRAVENOUS | Status: AC
  Administered 2020-12-21: 4 mg via INTRAVENOUS
  Filled 2020-12-21: qty 1

## 2020-12-21 MED ORDER — SODIUM CHLORIDE 0.9% FLUSH
3.0000 mL | Freq: Two times a day (BID) | INTRAVENOUS | Status: DC
  Administered 2020-12-21 – 2021-01-04 (×23): 3 mL via INTRAVENOUS

## 2020-12-21 MED ORDER — ONDANSETRON HCL 4 MG/2ML IJ SOLN
4.0000 mg | Freq: Once | INTRAMUSCULAR | Status: AC
  Administered 2020-12-21: 4 mg via INTRAVENOUS
  Filled 2020-12-21: qty 2

## 2020-12-21 MED ORDER — FUROSEMIDE 10 MG/ML IJ SOLN
80.0000 mg | Freq: Two times a day (BID) | INTRAMUSCULAR | Status: DC
  Administered 2020-12-21 – 2020-12-24 (×6): 80 mg via INTRAVENOUS
  Filled 2020-12-21 (×8): qty 8

## 2020-12-21 MED ORDER — SODIUM CHLORIDE 0.9 % IV SOLN: INTRAVENOUS | Status: DC

## 2020-12-21 MED ORDER — AMLODIPINE BESYLATE 5 MG PO TABS
5.0000 mg | ORAL_TABLET | Freq: Every day | ORAL | Status: DC
  Administered 2020-12-21 – 2020-12-23 (×3): 5 mg via ORAL
  Filled 2020-12-21 (×4): qty 1

## 2020-12-21 MED ORDER — MELATONIN 5 MG PO TABS
5.0000 mg | ORAL_TABLET | Freq: Every evening | ORAL | Status: DC | PRN
  Administered 2020-12-22 – 2020-12-26 (×5): 5 mg via ORAL
  Filled 2020-12-21 (×7): qty 1

## 2020-12-21 MED ORDER — SODIUM CHLORIDE 0.9 % IV SOLN: 250.0000 mL | INTRAVENOUS | Status: DC | PRN

## 2020-12-21 MED ORDER — PANTOPRAZOLE SODIUM 40 MG PO TBEC
40.0000 mg | DELAYED_RELEASE_TABLET | Freq: Every day | ORAL | Status: DC
  Administered 2020-12-21 – 2020-12-22 (×2): 40 mg via ORAL
  Filled 2020-12-21 (×2): qty 1

## 2020-12-21 MED ORDER — CARVEDILOL 25 MG PO TABS
25.0000 mg | ORAL_TABLET | Freq: Two times a day (BID) | ORAL | Status: DC
  Administered 2020-12-21 – 2020-12-23 (×4): 25 mg via ORAL
  Filled 2020-12-21 (×3): qty 1
  Filled 2020-12-21: qty 8
  Filled 2020-12-21: qty 1

## 2020-12-21 MED ORDER — HYDROCODONE-ACETAMINOPHEN 5-325 MG PO TABS
1.0000 | ORAL_TABLET | Freq: Four times a day (QID) | ORAL | Status: DC | PRN
  Administered 2020-12-21: 1 via ORAL
  Filled 2020-12-21: qty 1

## 2020-12-21 MED ORDER — ACETAMINOPHEN 325 MG PO TABS
650.0000 mg | ORAL_TABLET | ORAL | Status: DC | PRN
  Administered 2020-12-22 – 2020-12-30 (×16): 650 mg via ORAL
  Filled 2020-12-21 (×18): qty 2

## 2020-12-21 MED ORDER — ROSUVASTATIN CALCIUM 5 MG PO TABS
5.0000 mg | ORAL_TABLET | ORAL | Status: DC
  Administered 2020-12-21 – 2021-01-04 (×7): 5 mg via ORAL
  Filled 2020-12-21 (×10): qty 1

## 2020-12-21 MED ORDER — FUROSEMIDE 10 MG/ML IJ SOLN
40.0000 mg | Freq: Two times a day (BID) | INTRAMUSCULAR | Status: DC
  Administered 2020-12-21: 40 mg via INTRAVENOUS
  Filled 2020-12-21: qty 4

## 2020-12-21 MED ORDER — SODIUM CHLORIDE 0.9% FLUSH
3.0000 mL | INTRAVENOUS | Status: DC | PRN
  Administered 2020-12-26: 3 mL via INTRAVENOUS

## 2020-12-21 MED ORDER — ONDANSETRON HCL 4 MG/2ML IJ SOLN: 4.0000 mg | Freq: Four times a day (QID) | INTRAMUSCULAR | Status: DC | PRN

## 2020-12-21 NOTE — ED Notes (Signed)
BNP added on to previous lab draw.

## 2020-12-21 NOTE — H&P (Signed)
History and Physical    [name] [name] SKA:[id number] DOB: 1933/01/19 DOA: 12/20/2020  Referring MD/NP/PA: Marvis Repress, MD PCP: Antony Contras, MD  Patient coming from: Home via EMS  Chief Complaint: Falls  I have personally briefly reviewed patient's old medical records in [place]   HPI: [name] [name] is a 87 y.o. male with medical history significant of hypertension, A. fib on Eliquis, CAD s/p PCI in 2035, systolic CHF last EF 59-74% GERD, and chronic kidney disease stage III from home for falls.  History is obtained mostly from patient's daughter present at bedside as he is lethargic and easily falls asleep.  She reports that the patient had been living in [place], but family brought him closer due to worsening health.  He apparently patient had fallen at least 4-5 times, but most recently fell yesterday evening while he was in the shower.  Patient states that he recalls falling and reports that it feels like he just loses his balance.  Denies any loss of consciousness or trauma to his head. He has been ambulating with use of a walker.  Apparently patient has had severe back pain and sciatica for which he has been seen by physical therapy and has a scheduled appointment with orthopedics.  Due to the pain in his back he has not been sleeping well at night.  He has known kidney disease and had been previously told to drink plenty of fluids.  He has been drinking at least 7 to 8 glasses of water per day, 8 ounce Gatorade, and other drinks per the daughter's report.  Daughter reports that he also possibly accidentally ate his hearing aid sometime this week.  He had also intermittently been taking Aleve, but last took it 3 days ago.  ED Course: Upon admission into the emergency department patient was seen to be afebrile, heart rates 60-70s, respirations 13-22, blood pressures maintained, and O2 saturations 90-100% on room air.  CT scan of the head and cervical spine showed no acute  abnormality, but did note a partially visualized right-sided pleural effusion.  Labs significant for for hemoglobin 8.7, platelets 140, sodium 122, chloride 92, CO2 20, BUN 80, creatinine 4.45, calcium 10.8, AST 63, serum osmolarity 287, and TSH 2.577.  Urinalysis was positive for 30 mg/dL protein, but showed no significant signs of infection.  CT scan of the chest for bilateral nondisplaced rib fractures, a metallic foreign body in the gastric body measuring 2.1 cm possibly hearing aid, possible duodenitis/peptic ulcer disease, cardiomegaly with signs of fluid overload with pulmonary edema, and a 4.2 cm ascending aortic aneurysm.  patient was given normal saline IVFs at 75 ml/hr, morphine, and Zofran.    Review of Systems  Constitutional: Positive for malaise/fatigue.  HENT: Negative for congestion and nosebleeds.   Eyes: Negative for pain.  Respiratory: Negative for cough and sputum production.   Cardiovascular: Negative for chest pain and leg swelling.  Gastrointestinal: Negative for abdominal pain, diarrhea, nausea and vomiting.  Genitourinary: Negative for dysuria and hematuria.  Musculoskeletal: Positive for back pain and falls.  Skin: Negative for rash.  Neurological: Negative for loss of consciousness.  Psychiatric/Behavioral: The patient has insomnia.     Past Medical History:  Diagnosis Date  . Acid reflux   . Angina   . CAD (coronary artery disease), stent placed 11/26/11  11/27/2011  . Heart murmur   . Hypertension   . NSTEMI (non-ST elevated myocardial infarction) most likely as outpatient 11/24/2011  . Prostate cancer ([facility])   .  S/P angioplasty with stent, 11/26/11 distal AVGroove LCX into OM3 with BMS ([facility]) and rescue PTCA on OM2 jailed by stent. 11/27/2011    Past Surgical History:  Procedure Laterality Date  . CATARACT EXTRACTION W/ INTRAOCULAR LENS  IMPLANT, BILATERAL  ~ 2007  . INSERTION PROSTATE RADIATION SEED  2001  . LEFT HEART CATHETERIZATION WITH CORONARY  ANGIOGRAM N/A 11/26/2011   Procedure: LEFT HEART CATHETERIZATION WITH CORONARY ANGIOGRAM;  Surgeon: Sanda Klein, MD;  Location: [facility];  Service: Cardiovascular;  Laterality: N/A;  . STAPEDECTOMY  1960; 1980   left; right     reports that he quit smoking about 42 years ago. His smoking use included cigars and cigarettes. He has a 7.00 pack-year smoking history. He has never used smokeless tobacco. He reports current alcohol use. He reports that he does not use drugs.  No Known Allergies  No family history on file.  Prior to Admission medications   Medication Sig Start Date End Date Taking? Authorizing Provider  aspirin EC 81 MG tablet Take 81 mg by mouth daily.    [provider]  isosorbide mononitrate (IMDUR) 15 mg TB24 Take 0.5 tablets (15 mg total) by mouth daily. 11/28/11   Brett Canales, PA-C  omeprazole (PRILOSEC) 20 MG capsule Take 20 mg by mouth daily.    [provider]  rivaroxaban 20 MG TABS Take 20 mg by mouth daily at 12 noon. 11/28/11   Brett Canales, PA-C    Physical Exam:  Constitutional: Elderly male who appears lethargic but we will arouse and follow commands Vitals:   12/21/20 0445 12/21/20 0500 12/21/20 0545 12/21/20 0600  BP: 127/78 136/69 127/79 134/75  Pulse: 72 67 61 (!) 51  Resp: 17 13 17 17   Temp:      TempSrc:      SpO2: 94% 100% 95% 93%  Weight:      Height:       Eyes: PERRL, lids and conjunctivae normal ENMT: Mucous membranes are dry. Posterior pharynx clear of any exudate or lesions.  Neck: normal, supple, no masses, no thyromegaly Respiratory: Decreased aeration but no significant crackles, wheezes, or rhonchi appreciated.  Maintaining O2 saturations on room air currently. Cardiovascular: Regular rate and rhythm, no murmurs / rubs / gallops.  At least +1 pitting extremity edema. 2+ pedal pulses. No carotid bruits.  Abdomen: no tenderness, no masses palpated. No hepatosplenomegaly. Bowel sounds positive.   Musculoskeletal: no clubbing / cyanosis.  Tenderness to palpation of the back. Skin: no rashes, lesions, ulcers.  Some areas of bruising noted of the chest wall Neurologic: CN 2-12 grossly intact. Strength 5/5 in all 4.  Psychiatric: Normal judgment and insight.  Lethargic and oriented x 3. Normal mood.     Labs on Admission: I have personally reviewed following labs and imaging studies  CBC: Recent Labs  Lab 12/21/20 0007  WBC 10.3  NEUTROABS 8.3*  HGB 8.7*  HCT 25.5*  MCV 91.1  PLT 469*   Basic Metabolic Panel: Recent Labs  Lab 12/21/20 0007 12/21/20 0200  NA 122* 122*  K 4.9 4.8  CL 92* 92*  CO2 20* 18*  GLUCOSE 118* 107*  BUN 80* 80*  CREATININE 4.45* 4.32*  CALCIUM 10.8* 10.5*   GFR: Estimated Creatinine Clearance: 16.4 mL/min (A) (by C-G formula based on SCr of 4.32 mg/dL (H)). Liver Function Tests: Recent Labs  Lab 12/21/20 0007  AST 63*  ALT 41  ALKPHOS 84  BILITOT 1.3*  PROT 5.8*  ALBUMIN 3.2*  Recent Labs  Lab 12/21/20 0007  LIPASE 41   No results for input(s): AMMONIA in the last 168 hours. Coagulation Profile: No results for input(s): INR, PROTIME in the last 168 hours. Cardiac Enzymes: No results for input(s): CKTOTAL, CKMB, CKMBINDEX, TROPONINI in the last 168 hours. BNP (last 3 results) No results for input(s): PROBNP in the last 8760 hours. HbA1C: No results for input(s): HGBA1C in the last 72 hours. CBG: No results for input(s): GLUCAP in the last 168 hours. Lipid Profile: No results for input(s): CHOL, HDL, LDLCALC, TRIG, CHOLHDL, LDLDIRECT in the last 72 hours. Thyroid Function Tests: Recent Labs    12/21/20 0200  TSH 2.577   Anemia Panel: Recent Labs    12/21/20 0200  VITAMINB12 1,156*  FOLATE 8.1  TIBC 244*  IRON 37*   Urine analysis:    Component Value Date/Time   COLORURINE AMBER (A) 11/23/2011 1335   APPEARANCEUR CLOUDY (A) 11/23/2011 1335   LABSPEC 1.025 11/23/2011 1335   PHURINE 5.5 11/23/2011 1335    GLUCOSEU NEGATIVE 11/23/2011 1335   HGBUR NEGATIVE 11/23/2011 1335   BILIRUBINUR NEGATIVE 11/23/2011 1335   KETONESUR NEGATIVE 11/23/2011 1335   PROTEINUR 30 (A) 11/23/2011 1335   UROBILINOGEN 0.2 11/23/2011 1335   NITRITE NEGATIVE 11/23/2011 1335   LEUKOCYTESUR NEGATIVE 11/23/2011 1335   Sepsis Labs: Recent Results (from the past 240 hour(s))  Resp Panel by RT-PCR (Flu A&B, Covid) Nasopharyngeal Swab     Status: None   Collection Time: 12/21/20  2:45 AM   Specimen: Nasopharyngeal Swab; Nasopharyngeal(NP) swabs in vial transport medium  Result Value Ref Range Status   SARS Coronavirus 2 by RT PCR NEGATIVE NEGATIVE Final    Comment: (NOTE) SARS-CoV-2 target nucleic acids are NOT DETECTED.  The SARS-CoV-2 RNA is generally detectable in upper respiratory specimens during the acute phase of infection. The lowest concentration of SARS-CoV-2 viral copies this assay can detect is 138 copies/mL. A negative result does not preclude SARS-Cov-2 infection and should not be used as the sole basis for treatment or other patient management decisions. A negative result may occur with  improper specimen collection/handling, submission of specimen other than nasopharyngeal swab, presence of viral mutation(s) within the areas targeted by this assay, and inadequate number of viral copies(<138 copies/mL). A negative result must be combined with clinical observations, patient history, and epidemiological information. The expected result is Negative.  Fact Sheet for Patients:  EntrepreneurPulse.com.au  Fact Sheet for Healthcare Providers:  IncredibleEmployment.be  This test is no t yet approved or cleared by the [place] FDA and  has been authorized for detection and/or diagnosis of SARS-CoV-2 by FDA under an Emergency Use Authorization (EUA). This EUA will remain  in effect (meaning this test can be used) for the duration of the COVID-19 declaration under  Section 564(b)(1) of the Act, 21 U.S.C.section 360bbb-3(b)(1), unless the authorization is terminated  or revoked sooner.       Influenza A by PCR NEGATIVE NEGATIVE Final   Influenza B by PCR NEGATIVE NEGATIVE Final    Comment: (NOTE) The Xpert Xpress SARS-CoV-2/FLU/RSV plus assay is intended as an aid in the diagnosis of influenza from Nasopharyngeal swab specimens and should not be used as a sole basis for treatment. Nasal washings and aspirates are unacceptable for Xpert Xpress SARS-CoV-2/FLU/RSV testing.  Fact Sheet for Patients: EntrepreneurPulse.com.au  Fact Sheet for Healthcare Providers: IncredibleEmployment.be  This test is not yet approved or cleared by the [place] FDA and has been authorized for detection and/or diagnosis  of SARS-CoV-2 by FDA under an Emergency Use Authorization (EUA). This EUA will remain in effect (meaning this test can be used) for the duration of the COVID-19 declaration under Section 564(b)(1) of the Act, 21 U.S.C. section 360bbb-3(b)(1), unless the authorization is terminated or revoked.  Performed at [facility], [place] [address], [place], [place] [address]      Radiological Exams on Admission: CT Head Wo Contrast  Result Date: 12/21/2020 CLINICAL DATA:  Fall EXAM: CT HEAD WITHOUT CONTRAST TECHNIQUE: Contiguous axial images were obtained from the base of the skull through the vertex without intravenous contrast. COMPARISON:  None. FINDINGS: Brain: There is atrophy and chronic small vessel disease changes. No acute intracranial abnormality. Specifically, no hemorrhage, hydrocephalus, mass lesion, acute infarction, or significant intracranial injury. Vascular: No hyperdense vessel or unexpected calcification. Skull: No acute calvarial abnormality. Sinuses/Orbits: No acute findings Other: None IMPRESSION: Atrophy, chronic microvascular disease. No acute intracranial abnormality. Electronically Signed    By: Rolm Baptise M.D.   On: 12/21/2020 01:47   CT Cervical Spine Wo Contrast  Result Date: 12/21/2020 CLINICAL DATA:  Fall EXAM: CT CERVICAL SPINE WITHOUT CONTRAST TECHNIQUE: Multidetector CT imaging of the cervical spine was performed without intravenous contrast. Multiplanar CT image reconstructions were also generated. COMPARISON:  None. FINDINGS: Alignment: Slight anterolisthesis of C4 on C5 related to facet disease. Skull base and vertebrae: No acute fracture. No primary bone lesion or focal pathologic process. Soft tissues and spinal canal: No prevertebral fluid or swelling. No visible canal hematoma. Disc levels: Diffuse degenerative disc disease, most pronounced in the lower lumbar spine. Diffuse degenerative facet disease bilaterally. Upper chest: Right pleural effusion partially imaged. Other: None IMPRESSION: Degenerative changes.  No acute bony abnormality. Right pleural effusion. Electronically Signed   By: Rolm Baptise M.D.   On: 12/21/2020 01:49   CT T-SPINE NO CHARGE  Result Date: 12/21/2020 CLINICAL DATA:  Acute pain due to trauma. Bilateral shoulder pain. Possible swallowed foreign body. EXAM: CT CHEST, ABDOMEN AND PELVIS WITHOUT CONTRAST CT THORACIC AND LUMBAR SPINE WITHOUT CONTRAST TECHNIQUE: Multidetector CT imaging of the chest, abdomen and pelvis was performed following the standard protocol without IV contrast. Multiplanar CT images of the thoracic and lumbar spine were reconstructed from contemporary CT of the Chest, Abdomen, and Pelvis COMPARISON:  None. FINDINGS: CT CHEST FINDINGS Cardiovascular: Atherosclerotic changes are noted of the thoracic aorta. There is an ascending thoracic aortic aneurysm measuring approximately 4.2 cm. The heart size is enlarged. Coronary artery calcifications are noted. There is a trace pericardial effusion. The intracardiac blood pool is hypodense relative to the adjacent myocardium consistent with anemia. Mediastinum/Nodes: -- No mediastinal  lymphadenopathy. -- No hilar lymphadenopathy. -- No axillary lymphadenopathy. -- No supraclavicular lymphadenopathy. -- Normal thyroid gland where visualized. -  Unremarkable esophagus. Lungs/Pleura: There is a moderate-sized right-sided pleural effusion. There is a small left-sided pleural effusion. There are prominent interstitial lung markings bilaterally. No pneumothorax. There are few scattered ground-glass airspace opacities bilaterally. There is a 5 mm pulmonary nodule in the left upper lobe (axial series 6, image 58). Musculoskeletal: There are acute to subacute appearing anterior rib fractures on the right involving the third and fourth ribs. There appears to be a nondisplaced acute fracture involving the anterior second rib on the left. There are old healed anterior left-sided rib fractures. There are healing fractures involving the left seventh eighth ribs anteriorly. CT ABDOMEN PELVIS FINDINGS Hepatobiliary: Hepatic cysts are noted. Normal gallbladder.There is no biliary ductal dilation. Pancreas: Normal contours without  ductal dilatation. No peripancreatic fluid collection. Spleen: Unremarkable. Adrenals/Urinary Tract: --Adrenal glands: Unremarkable. --Right kidney/ureter: Multiple complex and simple appearing cysts are noted involving the right kidney. --Left kidney/ureter: Multiple complex and simple appearing cysts are noted on the patient's left. There is an indeterminate exophytic hyperdense nodule rising from the interpolar region of the left kidney measuring approximately 2.5 cm. This nodule contains multiple calcifications (axial series 3, image 72). --Urinary bladder: Unremarkable. Stomach/Bowel: --Stomach/Duodenum: There is a metallic foreign body in the gastric body measuring approximately 2.1 cm (axial series 3, image 68). --Small bowel: There appears to be mild wall thickening and possible adjacent fat stranding involving the proximal duodenum. --Colon: Rectosigmoid diverticulosis without  acute inflammation. --Appendix: Normal. Vascular/Lymphatic: Atherosclerotic calcification is present within the non-aneurysmal abdominal aorta, without hemodynamically significant stenosis. --No retroperitoneal lymphadenopathy. --No mesenteric lymphadenopathy. --No pelvic or inguinal lymphadenopathy. Reproductive: Brachytherapy beads are noted Other: There is nonspecific retroperitoneal fluid along the right psoas muscle. There is a fat containing umbilical hernia. Musculoskeletal. No acute displaced fractures. IMPRESSION: 1. There are bilateral nondisplaced anterior rib fractures without evidence for pneumothorax. 2. There is a metallic foreign body in the gastric body measuring approximately 2.1 cm. This may represent the reported swallowed hearing aid. 3. There appears to be mild wall thickening and possible adjacent fat stranding involving the proximal duodenum. This may represent duodenitis/peptic ulcer disease versus pancreatitis. There is a small volume of retroperitoneal free fluid is felt to be related. 4. There is a moderate-sized right-sided pleural effusion and a small left-sided pleural effusion. 5. There is a 5 mm pulmonary nodule in the left upper lobe. No follow-up needed if patient is low-risk. Non-contrast chest CT can be considered in 12 months if patient is high-risk. This recommendation follows the consensus statement: Guidelines for Management of Incidental Pulmonary Nodules Detected on CT Images: From the Fleischner Society 2017; Radiology 2017; 284:228-243. 6. There are indeterminate bilateral renal nodules favored to represent hemorrhagic or proteinaceous cysts. Follow-up with a nonemergent outpatient renal ultrasound is recommended. 7. Cardiomegaly and coronary artery calcifications. There are findings suggestive of volume overload and developing pulmonary edema. 8. Anemia. 9. Ascending thoracic aortic aneurysm measuring 4.2 cm. Recommend annual imaging followup by CTA or MRA. This  recommendation follows 2010 ACCF/AHA/AATS/ACR/ASA/SCA/SCAI/SIR/STS/SVM Guidelines for the Diagnosis and Management of Patients with Thoracic Aortic Disease. Circulation. 2010; 121: C166-A630. Aortic aneurysm NOS (ICD10-I71.9) 10. No acute fracture involving the thoracic or lumbar spine. Aortic Atherosclerosis (ICD10-I70.0). Electronically Signed   By: Constance Holster M.D.   On: 12/21/2020 02:31   CT L-SPINE NO CHARGE  Result Date: 12/21/2020 CLINICAL DATA:  Acute pain due to trauma. Bilateral shoulder pain. Possible swallowed foreign body. EXAM: CT CHEST, ABDOMEN AND PELVIS WITHOUT CONTRAST CT THORACIC AND LUMBAR SPINE WITHOUT CONTRAST TECHNIQUE: Multidetector CT imaging of the chest, abdomen and pelvis was performed following the standard protocol without IV contrast. Multiplanar CT images of the thoracic and lumbar spine were reconstructed from contemporary CT of the Chest, Abdomen, and Pelvis COMPARISON:  None. FINDINGS: CT CHEST FINDINGS Cardiovascular: Atherosclerotic changes are noted of the thoracic aorta. There is an ascending thoracic aortic aneurysm measuring approximately 4.2 cm. The heart size is enlarged. Coronary artery calcifications are noted. There is a trace pericardial effusion. The intracardiac blood pool is hypodense relative to the adjacent myocardium consistent with anemia. Mediastinum/Nodes: -- No mediastinal lymphadenopathy. -- No hilar lymphadenopathy. -- No axillary lymphadenopathy. -- No supraclavicular lymphadenopathy. -- Normal thyroid gland where visualized. -  Unremarkable esophagus. Lungs/Pleura: There is a  moderate-sized right-sided pleural effusion. There is a small left-sided pleural effusion. There are prominent interstitial lung markings bilaterally. No pneumothorax. There are few scattered ground-glass airspace opacities bilaterally. There is a 5 mm pulmonary nodule in the left upper lobe (axial series 6, image 58). Musculoskeletal: There are acute to subacute appearing  anterior rib fractures on the right involving the third and fourth ribs. There appears to be a nondisplaced acute fracture involving the anterior second rib on the left. There are old healed anterior left-sided rib fractures. There are healing fractures involving the left seventh eighth ribs anteriorly. CT ABDOMEN PELVIS FINDINGS Hepatobiliary: Hepatic cysts are noted. Normal gallbladder.There is no biliary ductal dilation. Pancreas: Normal contours without ductal dilatation. No peripancreatic fluid collection. Spleen: Unremarkable. Adrenals/Urinary Tract: --Adrenal glands: Unremarkable. --Right kidney/ureter: Multiple complex and simple appearing cysts are noted involving the right kidney. --Left kidney/ureter: Multiple complex and simple appearing cysts are noted on the patient's left. There is an indeterminate exophytic hyperdense nodule rising from the interpolar region of the left kidney measuring approximately 2.5 cm. This nodule contains multiple calcifications (axial series 3, image 72). --Urinary bladder: Unremarkable. Stomach/Bowel: --Stomach/Duodenum: There is a metallic foreign body in the gastric body measuring approximately 2.1 cm (axial series 3, image 68). --Small bowel: There appears to be mild wall thickening and possible adjacent fat stranding involving the proximal duodenum. --Colon: Rectosigmoid diverticulosis without acute inflammation. --Appendix: Normal. Vascular/Lymphatic: Atherosclerotic calcification is present within the non-aneurysmal abdominal aorta, without hemodynamically significant stenosis. --No retroperitoneal lymphadenopathy. --No mesenteric lymphadenopathy. --No pelvic or inguinal lymphadenopathy. Reproductive: Brachytherapy beads are noted Other: There is nonspecific retroperitoneal fluid along the right psoas muscle. There is a fat containing umbilical hernia. Musculoskeletal. No acute displaced fractures. IMPRESSION: 1. There are bilateral nondisplaced anterior rib fractures  without evidence for pneumothorax. 2. There is a metallic foreign body in the gastric body measuring approximately 2.1 cm. This may represent the reported swallowed hearing aid. 3. There appears to be mild wall thickening and possible adjacent fat stranding involving the proximal duodenum. This may represent duodenitis/peptic ulcer disease versus pancreatitis. There is a small volume of retroperitoneal free fluid is felt to be related. 4. There is a moderate-sized right-sided pleural effusion and a small left-sided pleural effusion. 5. There is a 5 mm pulmonary nodule in the left upper lobe. No follow-up needed if patient is low-risk. Non-contrast chest CT can be considered in 12 months if patient is high-risk. This recommendation follows the consensus statement: Guidelines for Management of Incidental Pulmonary Nodules Detected on CT Images: From the Fleischner Society 2017; Radiology 2017; 284:228-243. 6. There are indeterminate bilateral renal nodules favored to represent hemorrhagic or proteinaceous cysts. Follow-up with a nonemergent outpatient renal ultrasound is recommended. 7. Cardiomegaly and coronary artery calcifications. There are findings suggestive of volume overload and developing pulmonary edema. 8. Anemia. 9. Ascending thoracic aortic aneurysm measuring 4.2 cm. Recommend annual imaging followup by CTA or MRA. This recommendation follows 2010 ACCF/AHA/AATS/ACR/ASA/SCA/SCAI/SIR/STS/SVM Guidelines for the Diagnosis and Management of Patients with Thoracic Aortic Disease. Circulation. 2010; 121: W413-K440. Aortic aneurysm NOS (ICD10-I71.9) 10. No acute fracture involving the thoracic or lumbar spine. Aortic Atherosclerosis (ICD10-I70.0). Electronically Signed   By: Constance Holster M.D.   On: 12/21/2020 02:31   CT CHEST ABDOMEN PELVIS WO CONTRAST  Result Date: 12/21/2020 CLINICAL DATA:  Acute pain due to trauma. Bilateral shoulder pain. Possible swallowed foreign body. EXAM: CT CHEST, ABDOMEN AND  PELVIS WITHOUT CONTRAST CT THORACIC AND LUMBAR SPINE WITHOUT CONTRAST TECHNIQUE: Multidetector CT imaging of  the chest, abdomen and pelvis was performed following the standard protocol without IV contrast. Multiplanar CT images of the thoracic and lumbar spine were reconstructed from contemporary CT of the Chest, Abdomen, and Pelvis COMPARISON:  None. FINDINGS: CT CHEST FINDINGS Cardiovascular: Atherosclerotic changes are noted of the thoracic aorta. There is an ascending thoracic aortic aneurysm measuring approximately 4.2 cm. The heart size is enlarged. Coronary artery calcifications are noted. There is a trace pericardial effusion. The intracardiac blood pool is hypodense relative to the adjacent myocardium consistent with anemia. Mediastinum/Nodes: -- No mediastinal lymphadenopathy. -- No hilar lymphadenopathy. -- No axillary lymphadenopathy. -- No supraclavicular lymphadenopathy. -- Normal thyroid gland where visualized. -  Unremarkable esophagus. Lungs/Pleura: There is a moderate-sized right-sided pleural effusion. There is a small left-sided pleural effusion. There are prominent interstitial lung markings bilaterally. No pneumothorax. There are few scattered ground-glass airspace opacities bilaterally. There is a 5 mm pulmonary nodule in the left upper lobe (axial series 6, image 58). Musculoskeletal: There are acute to subacute appearing anterior rib fractures on the right involving the third and fourth ribs. There appears to be a nondisplaced acute fracture involving the anterior second rib on the left. There are old healed anterior left-sided rib fractures. There are healing fractures involving the left seventh eighth ribs anteriorly. CT ABDOMEN PELVIS FINDINGS Hepatobiliary: Hepatic cysts are noted. Normal gallbladder.There is no biliary ductal dilation. Pancreas: Normal contours without ductal dilatation. No peripancreatic fluid collection. Spleen: Unremarkable. Adrenals/Urinary Tract: --Adrenal glands:  Unremarkable. --Right kidney/ureter: Multiple complex and simple appearing cysts are noted involving the right kidney. --Left kidney/ureter: Multiple complex and simple appearing cysts are noted on the patient's left. There is an indeterminate exophytic hyperdense nodule rising from the interpolar region of the left kidney measuring approximately 2.5 cm. This nodule contains multiple calcifications (axial series 3, image 72). --Urinary bladder: Unremarkable. Stomach/Bowel: --Stomach/Duodenum: There is a metallic foreign body in the gastric body measuring approximately 2.1 cm (axial series 3, image 68). --Small bowel: There appears to be mild wall thickening and possible adjacent fat stranding involving the proximal duodenum. --Colon: Rectosigmoid diverticulosis without acute inflammation. --Appendix: Normal. Vascular/Lymphatic: Atherosclerotic calcification is present within the non-aneurysmal abdominal aorta, without hemodynamically significant stenosis. --No retroperitoneal lymphadenopathy. --No mesenteric lymphadenopathy. --No pelvic or inguinal lymphadenopathy. Reproductive: Brachytherapy beads are noted Other: There is nonspecific retroperitoneal fluid along the right psoas muscle. There is a fat containing umbilical hernia. Musculoskeletal. No acute displaced fractures. IMPRESSION: 1. There are bilateral nondisplaced anterior rib fractures without evidence for pneumothorax. 2. There is a metallic foreign body in the gastric body measuring approximately 2.1 cm. This may represent the reported swallowed hearing aid. 3. There appears to be mild wall thickening and possible adjacent fat stranding involving the proximal duodenum. This may represent duodenitis/peptic ulcer disease versus pancreatitis. There is a small volume of retroperitoneal free fluid is felt to be related. 4. There is a moderate-sized right-sided pleural effusion and a small left-sided pleural effusion. 5. There is a 5 mm pulmonary nodule in the  left upper lobe. No follow-up needed if patient is low-risk. Non-contrast chest CT can be considered in 12 months if patient is high-risk. This recommendation follows the consensus statement: Guidelines for Management of Incidental Pulmonary Nodules Detected on CT Images: From the Fleischner Society 2017; Radiology 2017; 284:228-243. 6. There are indeterminate bilateral renal nodules favored to represent hemorrhagic or proteinaceous cysts. Follow-up with a nonemergent outpatient renal ultrasound is recommended. 7. Cardiomegaly and coronary artery calcifications. There are findings suggestive of volume overload  and developing pulmonary edema. 8. Anemia. 9. Ascending thoracic aortic aneurysm measuring 4.2 cm. Recommend annual imaging followup by CTA or MRA. This recommendation follows 2010 ACCF/AHA/AATS/ACR/ASA/SCA/SCAI/SIR/STS/SVM Guidelines for the Diagnosis and Management of Patients with Thoracic Aortic Disease. Circulation. 2010; 121: M426-S341. Aortic aneurysm NOS (ICD10-I71.9) 10. No acute fracture involving the thoracic or lumbar spine. Aortic Atherosclerosis (ICD10-I70.0). Electronically Signed   By: Constance Holster M.D.   On: 12/21/2020 02:31    EKG: Independently reviewed.  Atrial fibrillation 70 bpm with QTC 525  Assessment/Plan Bilateral rib fracture secondary to fall: Acute.  Patient presents after having several falls at home that he reports related to losing balance.  Imaging studies significant for bilateral rib fractures without signs of pneumothorax. -Admit to a cardiac telemetry bed -Incentive spirometry  -Hydrocodone as needed as needed for pain -PT to eval and treat in a.m.  Systolic congestive heart failure exacerbation with right-sided pleural: Acute.  Patient noted to have at least 1+ pitting edema of the bilateral lower extremities.  A moderate right-sided pleural effusion on cardiomegaly on CT imaging of the chest given concern for fluid overload.  Family notes that the  patient had been drinking lots of fluids. -Admit to a cardiac telemetry bed -Strict I&O and daily weights -Check BNP -Check echocardiogram  -Lasix 40 mg IV  -We will consider formally consulting  Hyponatremia: Acute.  Patient presents with sodium 122, serum osmolarity 283, urine osmolarity 441, and urine sodium <10.  Question is secondary to polydipsia vs. CHF/renal failure vs. SIADH. -Serial BMPs -Nephrology consulted, we will follow-up for any further recommendation   Acute renal failure superimposed on chronic kidney disease: Patient's creatinine was 4.45 with BUN 80 on admission.  Last creatinine available was 1.6 back in 2013.  He appears still able to make urine.  Unclear if this is possibly cardiorenal syndrome. -Hold nephrotoxic agents -Appreciate nephrology consultative services, we will follow-up for further recommendations  Hypercalcemia: Acute.  Calcium levels noted to be elevated at 10.8.   -Hold vitamin supplements -Continue to monitor  Atrial fibrillation on chronic anticoagulation: Patient appears to be currently rate controlled. -Continue Coreg  -Held Eliquis for the possible need of any procedure -Maintain at least a goal magnesium 2 and Potassium 4  Duodenitis/PUD question of foreign body in gastric body: Acute.  Seen for CT imaging with concern for duodenitis/peptic ulcer disease and concern for foreign body in the gastric body thought to possibly be patient's hearing aid. -Continue PPI -GI formally consulted to evaluate patient   Essential hypertension: Blood pressures currently are 116/77-162/95.  Home blood pressure medications include amlodipine 5 mg daily, losartan 100 mg daily, and Coreg 25 mg twice daily with meals. -Held losartan -Continue Coreg and amlodipine as tolerated  Iron deficiency anemia: On admission hemoglobin 8.7 g/dL, but suspected secondary to chronic kidney disease as stool guaiacs were noted to be negative.  Iron level low at 37.  Patient  had been typed and screened for the possible need of blood products -Continue to monitor H&H  CAD: Prior history of patient having stent placed back in 2013 with bare-metal stent to the left circumflex AV groove to OM3. -Continue statin  Thrombocytopenia: Acute.  Platelet count 140 on admission however no baseline to compare -Continue to monitor  Hyperlipidemia -Continue Crestor  GERD -Pharmacy substitution of Protonix  Obesity: BMI 34.38 kg/m.  DVT prophylaxis: SCDs Code Status: Full Family Communication: Daughter updated at bedside Disposition Plan: To be determined Consults called: Nephrology, GI Admission status: Inpatient  Norval Morton MD [facility]   If 7PM-7AM, please contact night-coverage   12/21/2020, 7:04 AM

## 2020-12-21 NOTE — ED Notes (Signed)
Patient transported to CT scan . 

## 2020-12-21 NOTE — Consult Note (Addendum)
[name] [name]  HISTORY AND PHYSICAL  [name] [name] is an 87 y.o. male.    Chief Complaint:  Falls  HPI: Pt is an 44 M with a PMH sig for CAD, HTN, Afib on Eliquis, CHF with EF 45-50%, and CKD Stage 3 who is now seen in consultation at the request of Dr Tamala Julian for eval of hyponatremia.  Pt travelled to [facility] from [facility] (on his way home to [place]) and since arriving has experienced several falls and increased sleepiness.  He was told to drink lots of fluids in [place] d/t concern of dehydration- dtr describes that he has been drinking 7 8 oz glasses of water in addition to cranberry juice, milk, coffee, and matcha tea daily.    He was brought to the ED where Cr was 4.36 (baseline unknown), Na was 121, and CT revealed several nondisplaced rib fractures in addition to pleural effusions.  Has some LE edema.  CT abd revealed a gastric metallic foreign body, possibly a swallowed hearing aid.  He was initially given IVFs with NS.  Records from his nephrologist/ PCP are being faxed over to [facility].  He thinks he was told his Na was low at one point but isn't sure.  Pt falls asleep easily after we stop talking but is able to carry on a coherent conversation. He reports rib pain but denies SOB.  Has some LE edema.  Denies NSAIDs.    PMH: Past Medical History:  Diagnosis Date  . Acid reflux   . Angina   . CAD (coronary artery disease), stent placed 11/26/11  11/27/2011  . Heart murmur   . Hypertension   . NSTEMI (non-ST elevated myocardial infarction) most likely as outpatient 11/24/2011  . Prostate cancer ([place])   . S/P angioplasty with stent, 11/26/11 distal AVGroove LCX into OM3 with BMS (Integrity) and rescue PTCA on OM2 jailed by stent. 11/27/2011   PSH: Past Surgical History:  Procedure Laterality Date  . CATARACT EXTRACTION W/ INTRAOCULAR LENS  IMPLANT, BILATERAL  ~ 2007  . INSERTION PROSTATE RADIATION SEED  2001  . LEFT HEART CATHETERIZATION WITH CORONARY ANGIOGRAM N/A 11/26/2011   Procedure: LEFT  HEART CATHETERIZATION WITH CORONARY ANGIOGRAM;  Surgeon: Sanda Klein, MD;  Location: [facility]ATH [facility];  Service: Cardiovascular;  Laterality: N/A;  . STAPEDECTOMY  1960; 1980   left; right   Past Medical History:  Diagnosis Date  . Acid reflux   . Angina   . CAD (coronary artery disease), stent placed 11/26/11  11/27/2011  . Heart murmur   . Hypertension   . NSTEMI (non-ST elevated myocardial infarction) most likely as outpatient 11/24/2011  . Prostate cancer ([place])   . S/P angioplasty with stent, 11/26/11 distal AVGroove LCX into OM3 with BMS (Integrity) and rescue PTCA on OM2 jailed by stent. 11/27/2011    Medications: Reviewed in Epic  ALLERGIES:  No Known Allergies  FAM HX: Family History  Problem Relation Age of Onset  . Hypertension Father     Social History:   reports that he quit smoking about 42 years ago. His smoking use included cigars and cigarettes. He has a 7.00 pack-year smoking history. He has never used smokeless tobacco. He reports current alcohol use. He reports that he does not use drugs.  ROS: ROS: all other systems reviewed and are negative except as per HPI  Blood pressure 130/89, pulse 68, temperature 97.6 F (36.4 C), temperature source Oral, resp. rate 16, height 6' (1.829 m), weight 115 kg, SpO2 94 %. PHYSICAL  EXAM: Physical Exam  GEN NAD, lying in bed HEENT EOMI PERRL, tacky MM NECK + JVD PULM muffled bibasilar breath sounds CV irregular ABD soft EXT 1+ LE edema NEURO falls asleep easily during conversation, no frank asterixis SKIN tanned   Results for orders placed or performed during the hospital encounter of 12/20/20 (from the past 48 hour(s))  Cortisol, Random     Status: None   Collection Time: 12/20/20 11:40 PM  Result Value Ref Range   Cortisol, Plasma 30.6 ug/dL    Comment: (NOTE) AM    6.7 - 22.6 ug/dL PM   <10.0       ug/dL Performed at [facility] [address] [place], [place] [address]   Comprehensive metabolic panel      Status: Abnormal   Collection Time: 12/21/20 12:07 AM  Result Value Ref Range   Sodium 122 (L) 135 - 145 mmol/L   Potassium 4.9 3.5 - 5.1 mmol/L   Chloride 92 (L) 98 - 111 mmol/L   CO2 20 (L) 22 - 32 mmol/L   Glucose, Bld 118 (H) 70 - 99 mg/dL    Comment: Glucose reference range applies only to samples taken after fasting for at least 8 hours.   BUN 80 (H) 8 - 23 mg/dL   Creatinine, Ser 4.45 (H) 0.61 - 1.24 mg/dL   Calcium 10.8 (H) 8.9 - 10.3 mg/dL   Total Protein 5.8 (L) 6.5 - 8.1 g/dL   Albumin 3.2 (L) 3.5 - 5.0 g/dL   AST 63 (H) 15 - 41 U/L   ALT 41 0 - 44 U/L   Alkaline Phosphatase 84 38 - 126 U/L   Total Bilirubin 1.3 (H) 0.3 - 1.2 mg/dL   GFR, Estimated 12 (L) >60 mL/min    Comment: (NOTE) Calculated using the CKD-EPI Creatinine Equation (2021)    Anion gap 10 5 - 15    Comment: Performed at [facility], [place] [address] [place], [place] [address]  CBC with Differential     Status: Abnormal   Collection Time: 12/21/20 12:07 AM  Result Value Ref Range   WBC 10.3 4.0 - 10.5 K/uL   RBC 2.80 (L) 4.22 - 5.81 MIL/uL   Hemoglobin 8.7 (L) 13.0 - 17.0 g/dL   HCT 25.5 (L) 39.0 - 52.0 %   MCV 91.1 80.0 - 100.0 fL   MCH 31.1 26.0 - 34.0 pg   MCHC 34.1 30.0 - 36.0 g/dL   RDW 15.0 11.5 - 15.5 %   Platelets 140 (L) 150 - 400 K/uL   nRBC 0.0 0.0 - 0.2 %   Neutrophils Relative % 81 %   Neutro Abs 8.3 (H) 1.7 - 7.7 K/uL   Lymphocytes Relative 10 %   Lymphs Abs 1.1 0.7 - 4.0 K/uL   Monocytes Relative 5 %   Monocytes Absolute 0.6 0.1 - 1.0 K/uL   Eosinophils Relative 3 %   Eosinophils Absolute 0.3 0.0 - 0.5 K/uL   Basophils Relative 0 %   Basophils Absolute 0.0 0.0 - 0.1 K/uL   Immature Granulocytes 1 %   Abs Immature Granulocytes 0.09 (H) 0.00 - 0.07 K/uL    Comment: Performed at [facility] [address], [place], [place] [address]  Troponin I (High Sensitivity)     Status: None   Collection Time: 12/21/20 12:07 AM  Result Value Ref Range   Troponin I (High  Sensitivity) 14 <18 ng/L    Comment: (NOTE) Elevated high sensitivity troponin  I (hsTnI) values and significant  changes across serial measurements may suggest ACS but many other  chronic and acute conditions are known to elevate hsTnI results.  Refer to the "Links" section for chest pain algorithms and additional  guidance. Performed at [facility], [place] [address] [place], [place] [address]   Lipase, blood     Status: None   Collection Time: 12/21/20 12:07 AM  Result Value Ref Range   Lipase 41 11 - 51 U/L    Comment: Performed at [facility] [address], [place], [place] [address]  Troponin I (High Sensitivity)     Status: None   Collection Time: 12/21/20  2:00 AM  Result Value Ref Range   Troponin I (High Sensitivity) 13 <18 ng/L    Comment: (NOTE) Elevated high sensitivity troponin I (hsTnI) values and significant  changes across serial measurements may suggest ACS but many other  chronic and acute conditions are known to elevate hsTnI results.  Refer to the "Links" section for chest pain algorithms and additional  guidance. Performed at [facility], [place] [address] [place], [place] [address]   Osmolality     Status: None   Collection Time: 12/21/20  2:00 AM  Result Value Ref Range   Osmolality 283 275 - 295 mOsm/kg    Comment: Performed at [facility] [address] [place], [place] [address]  TSH     Status: None   Collection Time: 12/21/20  2:00 AM  Result Value Ref Range   TSH 2.577 0.350 - 4.500 uIU/mL    Comment: Performed by a 3rd Generation assay with a functional sensitivity of <=0.01 uIU/mL. Performed at [facility], [place] [address] [place], [place] [address]   Folate     Status: None   Collection Time: 12/21/20  2:00 AM  Result Value Ref Range   Folate 8.1 >5.9 ng/mL    Comment: Performed at [facility], [place] [address], [place], [place] [address]  Vitamin B12     Status: Abnormal   Collection Time: 12/21/20   2:00 AM  Result Value Ref Range   Vitamin B-12 1,156 (H) 180 - 914 pg/mL    Comment: (NOTE) This assay is not validated for testing neonatal or myeloproliferative syndrome specimens for Vitamin B12 levels. Performed at [facility], [place] [address] [place], [place] [address]   Iron and TIBC     Status: Abnormal   Collection Time: 12/21/20  2:00 AM  Result Value Ref Range   Iron 37 (L) 45 - 182 ug/dL   TIBC 244 (L) 250 - 450 ug/dL   Saturation Ratios 15 (L) 17.9 - 39.5 %   UIBC 207 ug/dL    Comment: Performed at [facility] [address] [place], [place] [address]  Basic metabolic panel     Status: Abnormal   Collection Time: 12/21/20  2:00 AM  Result Value Ref Range   Sodium 122 (L) 135 - 145 mmol/L   Potassium 4.8 3.5 - 5.1 mmol/L   Chloride 92 (L) 98 - 111 mmol/L   CO2 18 (L) 22 - 32 mmol/L   Glucose, Bld 107 (H) 70 - 99 mg/dL    Comment: Glucose reference range applies only to samples taken after fasting for at least 8 hours.   BUN 80 (H) 8 - 23 mg/dL   Creatinine, Ser 4.32 (H) 0.61 - 1.24 mg/dL   Calcium 10.5 (H)  8.9 - 10.3 mg/dL   GFR, Estimated 13 (L) >60 mL/min    Comment: (NOTE) Calculated using the CKD-EPI Creatinine Equation (2021)    Anion gap 12 5 - 15    Comment: Performed at [facility], [place] [address] [place], [place] [address]  Resp Panel by RT-PCR (Flu A&B, Covid) Nasopharyngeal Swab     Status: None   Collection Time: 12/21/20  2:45 AM   Specimen: Nasopharyngeal Swab; Nasopharyngeal(NP) swabs in vial transport medium  Result Value Ref Range   SARS Coronavirus 2 by RT PCR NEGATIVE NEGATIVE    Comment: (NOTE) SARS-CoV-2 target nucleic acids are NOT DETECTED.  The SARS-CoV-2 RNA is generally detectable in upper respiratory specimens during the acute phase of infection. The lowest concentration of SARS-CoV-2 viral copies this assay can detect is 138 copies/mL. A negative result does not preclude SARS-Cov-2 infection and should not  be used as the sole basis for treatment or other patient management decisions. A negative result may occur with  improper specimen collection/handling, submission of specimen other than nasopharyngeal swab, presence of viral mutation(s) within the areas targeted by this assay, and inadequate number of viral copies(<138 copies/mL). A negative result must be combined with clinical observations, patient history, and epidemiological information. The expected result is Negative.  Fact Sheet for Patients:  EntrepreneurPulse.com.au  Fact Sheet for Healthcare Providers:  IncredibleEmployment.be  This test is no t yet approved or cleared by the [place] FDA and  has been authorized for detection and/or diagnosis of SARS-CoV-2 by FDA under an Emergency Use Authorization (EUA). This EUA will remain  in effect (meaning this test can be used) for the duration of the COVID-19 declaration under Section 564(b)(1) of the Act, 21 U.S.C.section 360bbb-3(b)(1), unless the authorization is terminated  or revoked sooner.       Influenza A by PCR NEGATIVE NEGATIVE   Influenza B by PCR NEGATIVE NEGATIVE    Comment: (NOTE) The Xpert Xpress SARS-CoV-2/FLU/RSV plus assay is intended as an aid in the diagnosis of influenza from Nasopharyngeal swab specimens and should not be used as a sole basis for treatment. Nasal washings and aspirates are unacceptable for Xpert Xpress SARS-CoV-2/FLU/RSV testing.  Fact Sheet for Patients: EntrepreneurPulse.com.au  Fact Sheet for Healthcare Providers: IncredibleEmployment.be  This test is not yet approved or cleared by the [place] FDA and has been authorized for detection and/or diagnosis of SARS-CoV-2 by FDA under an Emergency Use Authorization (EUA). This EUA will remain in effect (meaning this test can be used) for the duration of the COVID-19 declaration under Section 564(b)(1) of  the Act, 21 U.S.C. section 360bbb-3(b)(1), unless the authorization is terminated or revoked.  Performed at [facility], [place] [address], [place], [place] [address]   Sodium, urine, random     Status: None   Collection Time: 12/21/20  6:47 AM  Result Value Ref Range   Sodium, Ur <10 mmol/L    Comment: Performed at [facility] [address] [place], [place] [address]  Osmolality, urine     Status: None   Collection Time: 12/21/20  6:47 AM  Result Value Ref Range   Osmolality, Ur 441 300 - 900 mOsm/kg    Comment: Performed at [facility] [address] [place], [place] [address]  Urinalysis, Complete w Microscopic Urine, Catheterized     Status: Abnormal   Collection Time: 12/21/20  6:48 AM  Result Value Ref Range   Color, Urine YELLOW YELLOW  APPearance CLEAR CLEAR   Specific Gravity, Urine 1.015 1.005 - 1.030   pH 5.0 5.0 - 8.0   Glucose, UA NEGATIVE NEGATIVE mg/dL   Hgb urine dipstick NEGATIVE NEGATIVE   Bilirubin Urine NEGATIVE NEGATIVE   Ketones, ur NEGATIVE NEGATIVE mg/dL   Protein, ur 30 (A) NEGATIVE mg/dL   Nitrite NEGATIVE NEGATIVE   Leukocytes,Ua NEGATIVE NEGATIVE   RBC / HPF 0-5 0 - 5 RBC/hpf   WBC, UA 0-5 0 - 5 WBC/hpf   Bacteria, UA NONE SEEN NONE SEEN   Squamous Epithelial / LPF 0-5 0 - 5   Mucus PRESENT    Ca Oxalate Crys, UA PRESENT     Comment: Performed at [facility] [address], [place], [place] [address]  Type and screen [place]     Status: None   Collection Time: 12/21/20  8:41 AM  Result Value Ref Range   ABO/RH(D) O POS    Antibody Screen NEG    Sample Expiration      12/24/2020,2359 Performed at [facility], [place] [address] [place], [place] [address]   Basic metabolic panel     Status: Abnormal   Collection Time: 12/21/20  8:48 AM  Result Value Ref Range   Sodium 121 (L) 135 - 145 mmol/L   Potassium 4.6 3.5 - 5.1 mmol/L   Chloride 91 (L) 98 - 111 mmol/L   CO2 20 (L) 22 - 32  mmol/L   Glucose, Bld 86 70 - 99 mg/dL    Comment: Glucose reference range applies only to samples taken after fasting for at least 8 hours.   BUN 79 (H) 8 - 23 mg/dL   Creatinine, Ser 4.36 (H) 0.61 - 1.24 mg/dL   Calcium 10.0 8.9 - 10.3 mg/dL   GFR, Estimated 13 (L) >60 mL/min    Comment: (NOTE) Calculated using the CKD-EPI Creatinine Equation (2021)    Anion gap 10 5 - 15    Comment: Performed at [facility] [address], [place], [place] [address]  CK     Status: Abnormal   Collection Time: 12/21/20  8:48 AM  Result Value Ref Range   Total CK 480 (H) 49 - 397 U/L    Comment: Performed at [facility], [place] [address] [place], [place] [address]  POC occult blood, ED     Status: None   Collection Time: 12/21/20 11:56 AM  Result Value Ref Range   Fecal Occult Bld NEGATIVE NEGATIVE  Brain natriuretic peptide     Status: Abnormal   Collection Time: 12/21/20 12:03 PM  Result Value Ref Range   B Natriuretic Peptide 969.4 (H) 0.0 - 100.0 pg/mL    Comment: Performed at [facility] [address] [place], [place] [address]    CT Head Wo Contrast  Result Date: 12/21/2020 CLINICAL DATA:  Fall EXAM: CT HEAD WITHOUT CONTRAST TECHNIQUE: Contiguous axial images were obtained from the base of the skull through the vertex without intravenous contrast. COMPARISON:  None. FINDINGS: Brain: There is atrophy and chronic small vessel disease changes. No acute intracranial abnormality. Specifically, no hemorrhage, hydrocephalus, mass lesion, acute infarction, or significant intracranial injury. Vascular: No hyperdense vessel or unexpected calcification. Skull: No acute calvarial abnormality. Sinuses/Orbits: No acute findings Other: None IMPRESSION: Atrophy, chronic microvascular disease. No acute intracranial abnormality. Electronically Signed   By: Rolm Baptise M.D.   On: 12/21/2020 01:47   CT Cervical Spine Wo Contrast  Result  Date: 12/21/2020 CLINICAL DATA:  Fall EXAM: CT  CERVICAL SPINE WITHOUT CONTRAST TECHNIQUE: Multidetector CT imaging of the cervical spine was performed without intravenous contrast. Multiplanar CT image reconstructions were also generated. COMPARISON:  None. FINDINGS: Alignment: Slight anterolisthesis of C4 on C5 related to facet disease. Skull base and vertebrae: No acute fracture. No primary bone lesion or focal pathologic process. Soft tissues and spinal canal: No prevertebral fluid or swelling. No visible canal hematoma. Disc levels: Diffuse degenerative disc disease, most pronounced in the lower lumbar spine. Diffuse degenerative facet disease bilaterally. Upper chest: Right pleural effusion partially imaged. Other: None IMPRESSION: Degenerative changes.  No acute bony abnormality. Right pleural effusion. Electronically Signed   By: Rolm Baptise M.D.   On: 12/21/2020 01:49   CT T-SPINE NO CHARGE  Result Date: 12/21/2020 CLINICAL DATA:  Acute pain due to trauma. Bilateral shoulder pain. Possible swallowed foreign body. EXAM: CT CHEST, ABDOMEN AND PELVIS WITHOUT CONTRAST CT THORACIC AND LUMBAR SPINE WITHOUT CONTRAST TECHNIQUE: Multidetector CT imaging of the chest, abdomen and pelvis was performed following the standard protocol without IV contrast. Multiplanar CT images of the thoracic and lumbar spine were reconstructed from contemporary CT of the Chest, Abdomen, and Pelvis COMPARISON:  None. FINDINGS: CT CHEST FINDINGS Cardiovascular: Atherosclerotic changes are noted of the thoracic aorta. There is an ascending thoracic aortic aneurysm measuring approximately 4.2 cm. The heart size is enlarged. Coronary artery calcifications are noted. There is a trace pericardial effusion. The intracardiac blood pool is hypodense relative to the adjacent myocardium consistent with anemia. Mediastinum/Nodes: -- No mediastinal lymphadenopathy. -- No hilar lymphadenopathy. -- No axillary lymphadenopathy. -- No supraclavicular lymphadenopathy. -- Normal thyroid gland where  visualized. -  Unremarkable esophagus. Lungs/Pleura: There is a moderate-sized right-sided pleural effusion. There is a small left-sided pleural effusion. There are prominent interstitial lung markings bilaterally. No pneumothorax. There are few scattered ground-glass airspace opacities bilaterally. There is a 5 mm pulmonary nodule in the left upper lobe (axial series 6, image 58). Musculoskeletal: There are acute to subacute appearing anterior rib fractures on the right involving the third and fourth ribs. There appears to be a nondisplaced acute fracture involving the anterior second rib on the left. There are old healed anterior left-sided rib fractures. There are healing fractures involving the left seventh eighth ribs anteriorly. CT ABDOMEN PELVIS FINDINGS Hepatobiliary: Hepatic cysts are noted. Normal gallbladder.There is no biliary ductal dilation. Pancreas: Normal contours without ductal dilatation. No peripancreatic fluid collection. Spleen: Unremarkable. Adrenals/Urinary Tract: --Adrenal glands: Unremarkable. --Right kidney/ureter: Multiple complex and simple appearing cysts are noted involving the right kidney. --Left kidney/ureter: Multiple complex and simple appearing cysts are noted on the patient's left. There is an indeterminate exophytic hyperdense nodule rising from the interpolar region of the left kidney measuring approximately 2.5 cm. This nodule contains multiple calcifications (axial series 3, image 72). --Urinary bladder: Unremarkable. Stomach/Bowel: --Stomach/Duodenum: There is a metallic foreign body in the gastric body measuring approximately 2.1 cm (axial series 3, image 68). --Small bowel: There appears to be mild wall thickening and possible adjacent fat stranding involving the proximal duodenum. --Colon: Rectosigmoid diverticulosis without acute inflammation. --Appendix: Normal. Vascular/Lymphatic: Atherosclerotic calcification is present within the non-aneurysmal abdominal aorta,  without hemodynamically significant stenosis. --No retroperitoneal lymphadenopathy. --No mesenteric lymphadenopathy. --No pelvic or inguinal lymphadenopathy. Reproductive: Brachytherapy beads are noted Other: There is nonspecific retroperitoneal fluid along the right psoas muscle. There is a fat containing umbilical hernia. Musculoskeletal. No acute displaced fractures. IMPRESSION: 1. There are bilateral nondisplaced anterior rib fractures  without evidence for pneumothorax. 2. There is a metallic foreign body in the gastric body measuring approximately 2.1 cm. This may represent the reported swallowed hearing aid. 3. There appears to be mild wall thickening and possible adjacent fat stranding involving the proximal duodenum. This may represent duodenitis/peptic ulcer disease versus pancreatitis. There is a small volume of retroperitoneal free fluid is felt to be related. 4. There is a moderate-sized right-sided pleural effusion and a small left-sided pleural effusion. 5. There is a 5 mm pulmonary nodule in the left upper lobe. No follow-up needed if patient is low-risk. Non-contrast chest CT can be considered in 12 months if patient is high-risk. This recommendation follows the consensus statement: Guidelines for Management of Incidental Pulmonary Nodules Detected on CT Images: From the Fleischner Society 2017; Radiology 2017; 284:228-243. 6. There are indeterminate bilateral renal nodules favored to represent hemorrhagic or proteinaceous cysts. Follow-up with a nonemergent outpatient renal ultrasound is recommended. 7. Cardiomegaly and coronary artery calcifications. There are findings suggestive of volume overload and developing pulmonary edema. 8. Anemia. 9. Ascending thoracic aortic aneurysm measuring 4.2 cm. Recommend annual imaging followup by CTA or MRA. This recommendation follows 2010 ACCF/AHA/AATS/ACR/ASA/SCA/SCAI/SIR/STS/SVM Guidelines for the Diagnosis and Management of Patients with Thoracic Aortic  Disease. Circulation. 2010; 121: U981-X914. Aortic aneurysm NOS (ICD10-I71.9) 10. No acute fracture involving the thoracic or lumbar spine. Aortic Atherosclerosis (ICD10-I70.0). Electronically Signed   By: Constance Holster M.D.   On: 12/21/2020 02:31   CT L-SPINE NO CHARGE  Result Date: 12/21/2020 CLINICAL DATA:  Acute pain due to trauma. Bilateral shoulder pain. Possible swallowed foreign body. EXAM: CT CHEST, ABDOMEN AND PELVIS WITHOUT CONTRAST CT THORACIC AND LUMBAR SPINE WITHOUT CONTRAST TECHNIQUE: Multidetector CT imaging of the chest, abdomen and pelvis was performed following the standard protocol without IV contrast. Multiplanar CT images of the thoracic and lumbar spine were reconstructed from contemporary CT of the Chest, Abdomen, and Pelvis COMPARISON:  None. FINDINGS: CT CHEST FINDINGS Cardiovascular: Atherosclerotic changes are noted of the thoracic aorta. There is an ascending thoracic aortic aneurysm measuring approximately 4.2 cm. The heart size is enlarged. Coronary artery calcifications are noted. There is a trace pericardial effusion. The intracardiac blood pool is hypodense relative to the adjacent myocardium consistent with anemia. Mediastinum/Nodes: -- No mediastinal lymphadenopathy. -- No hilar lymphadenopathy. -- No axillary lymphadenopathy. -- No supraclavicular lymphadenopathy. -- Normal thyroid gland where visualized. -  Unremarkable esophagus. Lungs/Pleura: There is a moderate-sized right-sided pleural effusion. There is a small left-sided pleural effusion. There are prominent interstitial lung markings bilaterally. No pneumothorax. There are few scattered ground-glass airspace opacities bilaterally. There is a 5 mm pulmonary nodule in the left upper lobe (axial series 6, image 58). Musculoskeletal: There are acute to subacute appearing anterior rib fractures on the right involving the third and fourth ribs. There appears to be a nondisplaced acute fracture involving the anterior  second rib on the left. There are old healed anterior left-sided rib fractures. There are healing fractures involving the left seventh eighth ribs anteriorly. CT ABDOMEN PELVIS FINDINGS Hepatobiliary: Hepatic cysts are noted. Normal gallbladder.There is no biliary ductal dilation. Pancreas: Normal contours without ductal dilatation. No peripancreatic fluid collection. Spleen: Unremarkable. Adrenals/Urinary Tract: --Adrenal glands: Unremarkable. --Right kidney/ureter: Multiple complex and simple appearing cysts are noted involving the right kidney. --Left kidney/ureter: Multiple complex and simple appearing cysts are noted on the patient's left. There is an indeterminate exophytic hyperdense nodule rising from the interpolar region of the left kidney measuring approximately 2.5 cm. This nodule contains  multiple calcifications (axial series 3, image 72). --Urinary bladder: Unremarkable. Stomach/Bowel: --Stomach/Duodenum: There is a metallic foreign body in the gastric body measuring approximately 2.1 cm (axial series 3, image 68). --Small bowel: There appears to be mild wall thickening and possible adjacent fat stranding involving the proximal duodenum. --Colon: Rectosigmoid diverticulosis without acute inflammation. --Appendix: Normal. Vascular/Lymphatic: Atherosclerotic calcification is present within the non-aneurysmal abdominal aorta, without hemodynamically significant stenosis. --No retroperitoneal lymphadenopathy. --No mesenteric lymphadenopathy. --No pelvic or inguinal lymphadenopathy. Reproductive: Brachytherapy beads are noted Other: There is nonspecific retroperitoneal fluid along the right psoas muscle. There is a fat containing umbilical hernia. Musculoskeletal. No acute displaced fractures. IMPRESSION: 1. There are bilateral nondisplaced anterior rib fractures without evidence for pneumothorax. 2. There is a metallic foreign body in the gastric body measuring approximately 2.1 cm. This may represent the  reported swallowed hearing aid. 3. There appears to be mild wall thickening and possible adjacent fat stranding involving the proximal duodenum. This may represent duodenitis/peptic ulcer disease versus pancreatitis. There is a small volume of retroperitoneal free fluid is felt to be related. 4. There is a moderate-sized right-sided pleural effusion and a small left-sided pleural effusion. 5. There is a 5 mm pulmonary nodule in the left upper lobe. No follow-up needed if patient is low-risk. Non-contrast chest CT can be considered in 12 months if patient is high-risk. This recommendation follows the consensus statement: Guidelines for Management of Incidental Pulmonary Nodules Detected on CT Images: From the Fleischner Society 2017; Radiology 2017; 284:228-243. 6. There are indeterminate bilateral renal nodules favored to represent hemorrhagic or proteinaceous cysts. Follow-up with a nonemergent outpatient renal ultrasound is recommended. 7. Cardiomegaly and coronary artery calcifications. There are findings suggestive of volume overload and developing pulmonary edema. 8. Anemia. 9. Ascending thoracic aortic aneurysm measuring 4.2 cm. Recommend annual imaging followup by CTA or MRA. This recommendation follows 2010 ACCF/AHA/AATS/ACR/ASA/SCA/SCAI/SIR/STS/SVM Guidelines for the Diagnosis and Management of Patients with Thoracic Aortic Disease. Circulation. 2010; 121: A540-J811. Aortic aneurysm NOS (ICD10-I71.9) 10. No acute fracture involving the thoracic or lumbar spine. Aortic Atherosclerosis (ICD10-I70.0). Electronically Signed   By: Constance Holster M.D.   On: 12/21/2020 02:31   CT CHEST ABDOMEN PELVIS WO CONTRAST  Result Date: 12/21/2020 CLINICAL DATA:  Acute pain due to trauma. Bilateral shoulder pain. Possible swallowed foreign body. EXAM: CT CHEST, ABDOMEN AND PELVIS WITHOUT CONTRAST CT THORACIC AND LUMBAR SPINE WITHOUT CONTRAST TECHNIQUE: Multidetector CT imaging of the chest, abdomen and pelvis was  performed following the standard protocol without IV contrast. Multiplanar CT images of the thoracic and lumbar spine were reconstructed from contemporary CT of the Chest, Abdomen, and Pelvis COMPARISON:  None. FINDINGS: CT CHEST FINDINGS Cardiovascular: Atherosclerotic changes are noted of the thoracic aorta. There is an ascending thoracic aortic aneurysm measuring approximately 4.2 cm. The heart size is enlarged. Coronary artery calcifications are noted. There is a trace pericardial effusion. The intracardiac blood pool is hypodense relative to the adjacent myocardium consistent with anemia. Mediastinum/Nodes: -- No mediastinal lymphadenopathy. -- No hilar lymphadenopathy. -- No axillary lymphadenopathy. -- No supraclavicular lymphadenopathy. -- Normal thyroid gland where visualized. -  Unremarkable esophagus. Lungs/Pleura: There is a moderate-sized right-sided pleural effusion. There is a small left-sided pleural effusion. There are prominent interstitial lung markings bilaterally. No pneumothorax. There are few scattered ground-glass airspace opacities bilaterally. There is a 5 mm pulmonary nodule in the left upper lobe (axial series 6, image 58). Musculoskeletal: There are acute to subacute appearing anterior rib fractures on the right involving the third and  fourth ribs. There appears to be a nondisplaced acute fracture involving the anterior second rib on the left. There are old healed anterior left-sided rib fractures. There are healing fractures involving the left seventh eighth ribs anteriorly. CT ABDOMEN PELVIS FINDINGS Hepatobiliary: Hepatic cysts are noted. Normal gallbladder.There is no biliary ductal dilation. Pancreas: Normal contours without ductal dilatation. No peripancreatic fluid collection. Spleen: Unremarkable. Adrenals/Urinary Tract: --Adrenal glands: Unremarkable. --Right kidney/ureter: Multiple complex and simple appearing cysts are noted involving the right kidney. --Left kidney/ureter:  Multiple complex and simple appearing cysts are noted on the patient's left. There is an indeterminate exophytic hyperdense nodule rising from the interpolar region of the left kidney measuring approximately 2.5 cm. This nodule contains multiple calcifications (axial series 3, image 72). --Urinary bladder: Unremarkable. Stomach/Bowel: --Stomach/Duodenum: There is a metallic foreign body in the gastric body measuring approximately 2.1 cm (axial series 3, image 68). --Small bowel: There appears to be mild wall thickening and possible adjacent fat stranding involving the proximal duodenum. --Colon: Rectosigmoid diverticulosis without acute inflammation. --Appendix: Normal. Vascular/Lymphatic: Atherosclerotic calcification is present within the non-aneurysmal abdominal aorta, without hemodynamically significant stenosis. --No retroperitoneal lymphadenopathy. --No mesenteric lymphadenopathy. --No pelvic or inguinal lymphadenopathy. Reproductive: Brachytherapy beads are noted Other: There is nonspecific retroperitoneal fluid along the right psoas muscle. There is a fat containing umbilical hernia. Musculoskeletal. No acute displaced fractures. IMPRESSION: 1. There are bilateral nondisplaced anterior rib fractures without evidence for pneumothorax. 2. There is a metallic foreign body in the gastric body measuring approximately 2.1 cm. This may represent the reported swallowed hearing aid. 3. There appears to be mild wall thickening and possible adjacent fat stranding involving the proximal duodenum. This may represent duodenitis/peptic ulcer disease versus pancreatitis. There is a small volume of retroperitoneal free fluid is felt to be related. 4. There is a moderate-sized right-sided pleural effusion and a small left-sided pleural effusion. 5. There is a 5 mm pulmonary nodule in the left upper lobe. No follow-up needed if patient is low-risk. Non-contrast chest CT can be considered in 12 months if patient is high-risk.  This recommendation follows the consensus statement: Guidelines for Management of Incidental Pulmonary Nodules Detected on CT Images: From the Fleischner Society 2017; Radiology 2017; 284:228-243. 6. There are indeterminate bilateral renal nodules favored to represent hemorrhagic or proteinaceous cysts. Follow-up with a nonemergent outpatient renal ultrasound is recommended. 7. Cardiomegaly and coronary artery calcifications. There are findings suggestive of volume overload and developing pulmonary edema. 8. Anemia. 9. Ascending thoracic aortic aneurysm measuring 4.2 cm. Recommend annual imaging followup by CTA or MRA. This recommendation follows 2010 ACCF/AHA/AATS/ACR/ASA/SCA/SCAI/SIR/STS/SVM Guidelines for the Diagnosis and Management of Patients with Thoracic Aortic Disease. Circulation. 2010; 121: Y814-G818. Aortic aneurysm NOS (ICD10-I71.9) 10. No acute fracture involving the thoracic or lumbar spine. Aortic Atherosclerosis (ICD10-I70.0). Electronically Signed   By: Constance Holster M.D.   On: 12/21/2020 02:31    Assessment/Plan  1.  AKI on CKD 3:  Unknown baseline Cr- getting records.  CT scan shows bilateral complex renal cysts but no obstruction.  No hypotensive episodes unless falls were vagal/ syncopal events.  UA unremarkable at present.  Priority will be getting volume off and improving Na.  Would hold cozaar.  Will give IV Lasix.  No indication for dialysis at present.    2.  Hyponatremia: appears to be hypervolemic hyponatremia in the setting of too much fluid.  Getting Lasix IV 40 BID.  Will get PM labs too.  Doesn't need 3% at present.    3.  Acute on chronic CHF exacerbation: cardiology on board- appreciate assistance  4.  Acute encephalopathy/ falls- may be precipitated by the hyponatremia.  He got morphine in the ED so he is a little sleepier than previous, his dtr says.    Madelon Lips 12/21/2020, 3:38 PM

## 2020-12-21 NOTE — H&P (View-Only) (Signed)
[name]  Referring Provider: [facility] Primary Care Physician:  Antony Contras, MD  Reason for Consultation:  Anemia, abnormal CT abdomen  HPI: [name] [name] is a 87 y.o. male came to ED after fall with chest and facial trauma.  Most of history from patient's son, [name] [name], who is at the bedside.  Patient was found to be anemic but hemoccult negative.  Patient has presbyacusis and his hearing aid was "lost" a couple days ago; on imaging here in ED, after his evaluation for trauma protocol, a metallic object (presumably the hearing aid) is in the stomach.  Also some duodenitis (possible peptic ulcer) in the proximal duodenum.  Patient denies hematemesis, abdominal pain, blood in stool.  No known prior endoscopy/colonoscopy.  He had multiple other lab dyscrasias and imaging abnormalities seen, including profound hyponatremia, elevated CK, acute renal failure, bilateral anterior rib fractures.   Past Medical History:  Diagnosis Date  . Acid reflux   . Angina   . CAD (coronary artery disease), stent placed 11/26/11  11/27/2011  . Heart murmur   . Hypertension   . NSTEMI (non-ST elevated myocardial infarction) most likely as outpatient 11/24/2011  . Prostate cancer ([place])   . S/P angioplasty with stent, 11/26/11 distal AVGroove LCX into OM3 with BMS ([facility]) and rescue PTCA on OM2 jailed by stent. 11/27/2011    Past Surgical History:  Procedure Laterality Date  . CATARACT EXTRACTION W/ INTRAOCULAR LENS  IMPLANT, BILATERAL  ~ 2007  . INSERTION PROSTATE RADIATION SEED  2001  . LEFT HEART CATHETERIZATION WITH CORONARY ANGIOGRAM N/A 11/26/2011   Procedure: LEFT HEART CATHETERIZATION WITH CORONARY ANGIOGRAM;  Surgeon: Sanda Klein, MD;  Location: [facility];  Service: Cardiovascular;  Laterality: N/A;  . STAPEDECTOMY  1960; 1980   left; right    Prior to Admission medications   Medication Sig Start Date End Date Taking? Authorizing Provider  acetaminophen  (TYLENOL) 650 MG CR tablet Take 650 mg by mouth every 8 (eight) hours as needed for pain.   Yes [provider]  allopurinol (ZYLOPRIM) 300 MG tablet Take 300 mg by mouth daily.   Yes [provider]  amLODipine (NORVASC) 5 MG tablet Take 5 mg by mouth daily.   Yes [provider]  apixaban (ELIQUIS) 5 MG TABS tablet Take 5 mg by mouth 2 (two) times daily.   Yes [provider]  aspirin EC 81 MG tablet Take 81 mg by mouth daily.   Yes [provider]  BETA CAROTENE PO Take 750 mcg by mouth daily.   Yes [provider]  bimatoprost (LUMIGAN) 0.03 % ophthalmic solution Place 1 drop into both eyes every evening. 12/07/20  Yes [provider]  carvedilol (COREG) 25 MG tablet Take 25 mg by mouth 2 (two) times daily with a meal.   Yes [provider]  Coenzyme Q10 (CO Q-10) 100 MG CAPS Take 100 mg by mouth daily.   Yes [provider]  losartan (COZAAR) 100 MG tablet Take 100 mg by mouth daily.   Yes [provider]  MELATONIN PO Take 1 tablet by mouth at bedtime as needed.   Yes [provider]  Omega-3 Fatty Acids (FISH OIL) 1000 MG CAPS Take 1,000 mg by mouth daily.   Yes [provider]  omeprazole (PRILOSEC) 20 MG capsule Take 20 mg by mouth daily.   Yes [provider]  rosuvastatin (CRESTOR) 5 MG tablet Take 5 mg by mouth See admin instructions. Take 5mg  daily  on Mondays, wednesdays, and fridays   Yes [provider]  vitamin B-12 (CYANOCOBALAMIN) 500 MCG tablet Take 500 mcg by mouth daily.   Yes [provider]  VITAMIN D, CHOLECALCIFEROL, PO Take 250 mcg by mouth daily.   Yes [provider]  vitamin E 180 MG (400 UNITS) capsule Take 400 Units by mouth daily.   Yes [provider]    Current Facility-Administered Medications  Medication Dose Route Frequency Provider Last Rate Last Admin  . 0.9 %  sodium chloride infusion  250 mL Intravenous  PRN Fuller Plan A, MD      . acetaminophen (TYLENOL) tablet 650 mg  650 mg Oral Q4H PRN Smith, Rondell A, MD      . amLODipine (NORVASC) tablet 5 mg  5 mg Oral Daily Smith, Rondell A, MD      . carvedilol (COREG) tablet 25 mg  25 mg Oral BID WC Smith, Rondell A, MD      . furosemide (LASIX) injection 40 mg  40 mg Intravenous BID Fuller Plan A, MD   40 mg at 12/21/20 0817  . HYDROcodone-acetaminophen (NORCO/VICODIN) 5-325 MG per tablet 1 tablet  1 tablet Oral Q6H PRN Smith, Rondell A, MD      . latanoprost (XALATAN) 0.005 % ophthalmic solution 1 drop  1 drop Both Eyes QHS Smith, Rondell A, MD      . melatonin tablet 5 mg  5 mg Oral QHS PRN Tamala Julian, Rondell A, MD      . ondansetron (ZOFRAN) injection 4 mg  4 mg Intravenous Q6H PRN Smith, Rondell A, MD      . pantoprazole (PROTONIX) EC tablet 40 mg  40 mg Oral Daily Smith, Rondell A, MD      . rosuvastatin (CRESTOR) tablet 5 mg  5 mg Oral Q M,W,F Smith, Rondell A, MD      . sodium chloride flush (NS) 0.9 % injection 3 mL  3 mL Intravenous Q12H Smith, Rondell A, MD   3 mL at 12/21/20 1115  . sodium chloride flush (NS) 0.9 % injection 3 mL  3 mL Intravenous PRN Norval Morton, MD       Current Outpatient Medications  Medication Sig Dispense Refill  . acetaminophen (TYLENOL) 650 MG CR tablet Take 650 mg by mouth every 8 (eight) hours as needed for pain.    Marland Kitchen allopurinol (ZYLOPRIM) 300 MG tablet Take 300 mg by mouth daily.    Marland Kitchen amLODipine (NORVASC) 5 MG tablet Take 5 mg by mouth daily.    Marland Kitchen apixaban (ELIQUIS) 5 MG TABS tablet Take 5 mg by mouth 2 (two) times daily.    Marland Kitchen aspirin EC 81 MG tablet Take 81 mg by mouth daily.    Marland Kitchen BETA CAROTENE PO Take 750 mcg by mouth daily.    . bimatoprost (LUMIGAN) 0.03 % ophthalmic solution Place 1 drop into both eyes every evening.    . carvedilol (COREG) 25 MG tablet Take 25 mg by mouth 2 (two) times daily with a meal.    . Coenzyme Q10 (CO Q-10) 100 MG CAPS Take 100 mg by mouth daily.    Marland Kitchen losartan (COZAAR)  100 MG tablet Take 100 mg by mouth daily.    Marland Kitchen MELATONIN PO Take 1 tablet by mouth at bedtime as needed.    . Omega-3 Fatty Acids (FISH OIL) 1000 MG CAPS Take 1,000 mg by mouth daily.    Marland Kitchen omeprazole (PRILOSEC) 20 MG capsule Take 20 mg by mouth daily.    Marland Kitchen  rosuvastatin (CRESTOR) 5 MG tablet Take 5 mg by mouth See admin instructions. Take 5mg  daily on Mondays, wednesdays, and fridays    . vitamin B-12 (CYANOCOBALAMIN) 500 MCG tablet Take 500 mcg by mouth daily.    Marland Kitchen VITAMIN D, CHOLECALCIFEROL, PO Take 250 mcg by mouth daily.    . vitamin E 180 MG (400 UNITS) capsule Take 400 Units by mouth daily.      Allergies as of 12/20/2020  . (No Known Allergies)    No family history on file.  Social History   Socioeconomic History  . Marital status: Married    Spouse name: Not on file  . Number of children: Not on file  . Years of education: Not on file  . Highest education level: Not on file  Occupational History  . Not on file  Tobacco Use  . Smoking status: Former Smoker    Packs/day: 0.50    Years: 14.00    Pack years: 7.00    Types: Cigars, Cigarettes    Quit date: 09/17/1978    Years since quitting: 42.2  . Smokeless tobacco: Never Used  Substance and Sexual Activity  . Alcohol use: Yes    Comment: 11/23/11 "haven't drank anything  since ~ 2008"  . Drug use: No  . Sexual activity: Never  Other Topics Concern  . Not on file  Social History Narrative  . Not on file   Social Determinants of Health   Financial Resource Strain: Not on file  Food Insecurity: Not on file  Transportation Needs: Not on file  Physical Activity: Not on file  Stress: Not on file  Social Connections: Not on file  Intimate Partner Violence: Not on file    Review of Systems: As per HPI, all others negative  Physical Exam: Vital signs in last 24 hours: Temp:  [96.8 F (36 C)-97.6 F (36.4 C)] 97.6 F (36.4 C) (04/06 1131) Pulse Rate:  [28-87] 56 (04/06 1245) Resp:  [13-25] 25 (04/06 1245) BP:  (116-162)/(69-110) 119/90 (04/06 1245) SpO2:  [90 %-100 %] 97 % (04/06 1245) Weight:  [672 kg] 115 kg (04/05 2325)   General:   Alert,presbyacusis, trauma left eye Head:  Normocephalic and atraumatic. Eyes:  Sclera clear, no icterus.   Conjunctiva pale Ears:  Presbyacusis Nose:  No deformity, discharge,  or lesions. Mouth:  No deformity or lesions.  Oropharynx pale and dry Neck:  Supple; no masses or thyromegaly. Abdomen:  Soft, nontender and nondistended. No masses, hepatosplenomegaly or hernias noted. Normal bowel sounds, without guarding, and without rebound.     Msk:  Symmetrical without gross deformities. Normal posture. Pulses:  Normal pulses noted. Extremities:  Without clubbing or edema. Neurologic:  Alert and  oriented x4;  Skin:  Scattered ecchymoses, otherwise intact without significant lesions or rashes. Psych:  Alert and cooperative. Normal mood and affect.   Lab Results: Recent Labs    12/21/20 0007  WBC 10.3  HGB 8.7*  HCT 25.5*  PLT 140*   BMET Recent Labs    12/21/20 0007 12/21/20 0200 12/21/20 0848  NA 122* 122* 121*  K 4.9 4.8 4.6  CL 92* 92* 91*  CO2 20* 18* 20*  GLUCOSE 118* 107* 86  BUN 80* 80* 79*  CREATININE 4.45* 4.32* 4.36*  CALCIUM 10.8* 10.5* 10.0   LFT Recent Labs    12/21/20 0007  PROT 5.8*  ALBUMIN 3.2*  AST 63*  ALT 41  ALKPHOS 84  BILITOT 1.3*   PT/INR No results for input(s):  LABPROT, INR in the last 72 hours.  Studies/Results: CT Head Wo Contrast  Result Date: 12/21/2020 CLINICAL DATA:  Fall EXAM: CT HEAD WITHOUT CONTRAST TECHNIQUE: Contiguous axial images were obtained from the base of the skull through the vertex without intravenous contrast. COMPARISON:  None. FINDINGS: Brain: There is atrophy and chronic small vessel disease changes. No acute intracranial abnormality. Specifically, no hemorrhage, hydrocephalus, mass lesion, acute infarction, or significant intracranial injury. Vascular: No hyperdense vessel or  unexpected calcification. Skull: No acute calvarial abnormality. Sinuses/Orbits: No acute findings Other: None IMPRESSION: Atrophy, chronic microvascular disease. No acute intracranial abnormality. Electronically Signed   By: Rolm Baptise M.D.   On: 12/21/2020 01:47   CT Cervical Spine Wo Contrast  Result Date: 12/21/2020 CLINICAL DATA:  Fall EXAM: CT CERVICAL SPINE WITHOUT CONTRAST TECHNIQUE: Multidetector CT imaging of the cervical spine was performed without intravenous contrast. Multiplanar CT image reconstructions were also generated. COMPARISON:  None. FINDINGS: Alignment: Slight anterolisthesis of C4 on C5 related to facet disease. Skull base and vertebrae: No acute fracture. No primary bone lesion or focal pathologic process. Soft tissues and spinal canal: No prevertebral fluid or swelling. No visible canal hematoma. Disc levels: Diffuse degenerative disc disease, most pronounced in the lower lumbar spine. Diffuse degenerative facet disease bilaterally. Upper chest: Right pleural effusion partially imaged. Other: None IMPRESSION: Degenerative changes.  No acute bony abnormality. Right pleural effusion. Electronically Signed   By: Rolm Baptise M.D.   On: 12/21/2020 01:49   CT T-SPINE NO CHARGE  Result Date: 12/21/2020 CLINICAL DATA:  Acute pain due to trauma. Bilateral shoulder pain. Possible swallowed foreign body. EXAM: CT CHEST, ABDOMEN AND PELVIS WITHOUT CONTRAST CT THORACIC AND LUMBAR SPINE WITHOUT CONTRAST TECHNIQUE: Multidetector CT imaging of the chest, abdomen and pelvis was performed following the standard protocol without IV contrast. Multiplanar CT images of the thoracic and lumbar spine were reconstructed from contemporary CT of the Chest, Abdomen, and Pelvis COMPARISON:  None. FINDINGS: CT CHEST FINDINGS Cardiovascular: Atherosclerotic changes are noted of the thoracic aorta. There is an ascending thoracic aortic aneurysm measuring approximately 4.2 cm. The heart size is enlarged. Coronary  artery calcifications are noted. There is a trace pericardial effusion. The intracardiac blood pool is hypodense relative to the adjacent myocardium consistent with anemia. Mediastinum/Nodes: -- No mediastinal lymphadenopathy. -- No hilar lymphadenopathy. -- No axillary lymphadenopathy. -- No supraclavicular lymphadenopathy. -- Normal thyroid gland where visualized. -  Unremarkable esophagus. Lungs/Pleura: There is a moderate-sized right-sided pleural effusion. There is a small left-sided pleural effusion. There are prominent interstitial lung markings bilaterally. No pneumothorax. There are few scattered ground-glass airspace opacities bilaterally. There is a 5 mm pulmonary nodule in the left upper lobe (axial series 6, image 58). Musculoskeletal: There are acute to subacute appearing anterior rib fractures on the right involving the third and fourth ribs. There appears to be a nondisplaced acute fracture involving the anterior second rib on the left. There are old healed anterior left-sided rib fractures. There are healing fractures involving the left seventh eighth ribs anteriorly. CT ABDOMEN PELVIS FINDINGS Hepatobiliary: Hepatic cysts are noted. Normal gallbladder.There is no biliary ductal dilation. Pancreas: Normal contours without ductal dilatation. No peripancreatic fluid collection. Spleen: Unremarkable. Adrenals/Urinary Tract: --Adrenal glands: Unremarkable. --Right kidney/ureter: Multiple complex and simple appearing cysts are noted involving the right kidney. --Left kidney/ureter: Multiple complex and simple appearing cysts are noted on the patient's left. There is an indeterminate exophytic hyperdense nodule rising from the interpolar region of the left kidney measuring approximately 2.5  cm. This nodule contains multiple calcifications (axial series 3, image 72). --Urinary bladder: Unremarkable. Stomach/Bowel: --Stomach/Duodenum: There is a metallic foreign body in the gastric body measuring  approximately 2.1 cm (axial series 3, image 68). --Small bowel: There appears to be mild wall thickening and possible adjacent fat stranding involving the proximal duodenum. --Colon: Rectosigmoid diverticulosis without acute inflammation. --Appendix: Normal. Vascular/Lymphatic: Atherosclerotic calcification is present within the non-aneurysmal abdominal aorta, without hemodynamically significant stenosis. --No retroperitoneal lymphadenopathy. --No mesenteric lymphadenopathy. --No pelvic or inguinal lymphadenopathy. Reproductive: Brachytherapy beads are noted Other: There is nonspecific retroperitoneal fluid along the right psoas muscle. There is a fat containing umbilical hernia. Musculoskeletal. No acute displaced fractures. IMPRESSION: 1. There are bilateral nondisplaced anterior rib fractures without evidence for pneumothorax. 2. There is a metallic foreign body in the gastric body measuring approximately 2.1 cm. This may represent the reported swallowed hearing aid. 3. There appears to be mild wall thickening and possible adjacent fat stranding involving the proximal duodenum. This may represent duodenitis/peptic ulcer disease versus pancreatitis. There is a small volume of retroperitoneal free fluid is felt to be related. 4. There is a moderate-sized right-sided pleural effusion and a small left-sided pleural effusion. 5. There is a 5 mm pulmonary nodule in the left upper lobe. No follow-up needed if patient is low-risk. Non-contrast chest CT can be considered in 12 months if patient is high-risk. This recommendation follows the consensus statement: Guidelines for Management of Incidental Pulmonary Nodules Detected on CT Images: From the Fleischner Society 2017; Radiology 2017; 284:228-243. 6. There are indeterminate bilateral renal nodules favored to represent hemorrhagic or proteinaceous cysts. Follow-up with a nonemergent outpatient renal ultrasound is recommended. 7. Cardiomegaly and coronary artery  calcifications. There are findings suggestive of volume overload and developing pulmonary edema. 8. Anemia. 9. Ascending thoracic aortic aneurysm measuring 4.2 cm. Recommend annual imaging followup by CTA or MRA. This recommendation follows 2010 ACCF/AHA/AATS/ACR/ASA/SCA/SCAI/SIR/STS/SVM Guidelines for the Diagnosis and Management of Patients with Thoracic Aortic Disease. Circulation. 2010; 121: Q034-V425. Aortic aneurysm NOS (ICD10-I71.9) 10. No acute fracture involving the thoracic or lumbar spine. Aortic Atherosclerosis (ICD10-I70.0). Electronically Signed   By: Constance Holster M.D.   On: 12/21/2020 02:31   CT L-SPINE NO CHARGE  Result Date: 12/21/2020 CLINICAL DATA:  Acute pain due to trauma. Bilateral shoulder pain. Possible swallowed foreign body. EXAM: CT CHEST, ABDOMEN AND PELVIS WITHOUT CONTRAST CT THORACIC AND LUMBAR SPINE WITHOUT CONTRAST TECHNIQUE: Multidetector CT imaging of the chest, abdomen and pelvis was performed following the standard protocol without IV contrast. Multiplanar CT images of the thoracic and lumbar spine were reconstructed from contemporary CT of the Chest, Abdomen, and Pelvis COMPARISON:  None. FINDINGS: CT CHEST FINDINGS Cardiovascular: Atherosclerotic changes are noted of the thoracic aorta. There is an ascending thoracic aortic aneurysm measuring approximately 4.2 cm. The heart size is enlarged. Coronary artery calcifications are noted. There is a trace pericardial effusion. The intracardiac blood pool is hypodense relative to the adjacent myocardium consistent with anemia. Mediastinum/Nodes: -- No mediastinal lymphadenopathy. -- No hilar lymphadenopathy. -- No axillary lymphadenopathy. -- No supraclavicular lymphadenopathy. -- Normal thyroid gland where visualized. -  Unremarkable esophagus. Lungs/Pleura: There is a moderate-sized right-sided pleural effusion. There is a small left-sided pleural effusion. There are prominent interstitial lung markings bilaterally. No  pneumothorax. There are few scattered ground-glass airspace opacities bilaterally. There is a 5 mm pulmonary nodule in the left upper lobe (axial series 6, image 58). Musculoskeletal: There are acute to subacute appearing anterior rib fractures on the right involving  the third and fourth ribs. There appears to be a nondisplaced acute fracture involving the anterior second rib on the left. There are old healed anterior left-sided rib fractures. There are healing fractures involving the left seventh eighth ribs anteriorly. CT ABDOMEN PELVIS FINDINGS Hepatobiliary: Hepatic cysts are noted. Normal gallbladder.There is no biliary ductal dilation. Pancreas: Normal contours without ductal dilatation. No peripancreatic fluid collection. Spleen: Unremarkable. Adrenals/Urinary Tract: --Adrenal glands: Unremarkable. --Right kidney/ureter: Multiple complex and simple appearing cysts are noted involving the right kidney. --Left kidney/ureter: Multiple complex and simple appearing cysts are noted on the patient's left. There is an indeterminate exophytic hyperdense nodule rising from the interpolar region of the left kidney measuring approximately 2.5 cm. This nodule contains multiple calcifications (axial series 3, image 72). --Urinary bladder: Unremarkable. Stomach/Bowel: --Stomach/Duodenum: There is a metallic foreign body in the gastric body measuring approximately 2.1 cm (axial series 3, image 68). --Small bowel: There appears to be mild wall thickening and possible adjacent fat stranding involving the proximal duodenum. --Colon: Rectosigmoid diverticulosis without acute inflammation. --Appendix: Normal. Vascular/Lymphatic: Atherosclerotic calcification is present within the non-aneurysmal abdominal aorta, without hemodynamically significant stenosis. --No retroperitoneal lymphadenopathy. --No mesenteric lymphadenopathy. --No pelvic or inguinal lymphadenopathy. Reproductive: Brachytherapy beads are noted Other: There is  nonspecific retroperitoneal fluid along the right psoas muscle. There is a fat containing umbilical hernia. Musculoskeletal. No acute displaced fractures. IMPRESSION: 1. There are bilateral nondisplaced anterior rib fractures without evidence for pneumothorax. 2. There is a metallic foreign body in the gastric body measuring approximately 2.1 cm. This may represent the reported swallowed hearing aid. 3. There appears to be mild wall thickening and possible adjacent fat stranding involving the proximal duodenum. This may represent duodenitis/peptic ulcer disease versus pancreatitis. There is a small volume of retroperitoneal free fluid is felt to be related. 4. There is a moderate-sized right-sided pleural effusion and a small left-sided pleural effusion. 5. There is a 5 mm pulmonary nodule in the left upper lobe. No follow-up needed if patient is low-risk. Non-contrast chest CT can be considered in 12 months if patient is high-risk. This recommendation follows the consensus statement: Guidelines for Management of Incidental Pulmonary Nodules Detected on CT Images: From the Fleischner Society 2017; Radiology 2017; 284:228-243. 6. There are indeterminate bilateral renal nodules favored to represent hemorrhagic or proteinaceous cysts. Follow-up with a nonemergent outpatient renal ultrasound is recommended. 7. Cardiomegaly and coronary artery calcifications. There are findings suggestive of volume overload and developing pulmonary edema. 8. Anemia. 9. Ascending thoracic aortic aneurysm measuring 4.2 cm. Recommend annual imaging followup by CTA or MRA. This recommendation follows 2010 ACCF/AHA/AATS/ACR/ASA/SCA/SCAI/SIR/STS/SVM Guidelines for the Diagnosis and Management of Patients with Thoracic Aortic Disease. Circulation. 2010; 121: A355-D322. Aortic aneurysm NOS (ICD10-I71.9) 10. No acute fracture involving the thoracic or lumbar spine. Aortic Atherosclerosis (ICD10-I70.0). Electronically Signed   By: Constance Holster M.D.   On: 12/21/2020 02:31   CT CHEST ABDOMEN PELVIS WO CONTRAST  Result Date: 12/21/2020 CLINICAL DATA:  Acute pain due to trauma. Bilateral shoulder pain. Possible swallowed foreign body. EXAM: CT CHEST, ABDOMEN AND PELVIS WITHOUT CONTRAST CT THORACIC AND LUMBAR SPINE WITHOUT CONTRAST TECHNIQUE: Multidetector CT imaging of the chest, abdomen and pelvis was performed following the standard protocol without IV contrast. Multiplanar CT images of the thoracic and lumbar spine were reconstructed from contemporary CT of the Chest, Abdomen, and Pelvis COMPARISON:  None. FINDINGS: CT CHEST FINDINGS Cardiovascular: Atherosclerotic changes are noted of the thoracic aorta. There is an ascending thoracic aortic aneurysm measuring approximately 4.2  cm. The heart size is enlarged. Coronary artery calcifications are noted. There is a trace pericardial effusion. The intracardiac blood pool is hypodense relative to the adjacent myocardium consistent with anemia. Mediastinum/Nodes: -- No mediastinal lymphadenopathy. -- No hilar lymphadenopathy. -- No axillary lymphadenopathy. -- No supraclavicular lymphadenopathy. -- Normal thyroid gland where visualized. -  Unremarkable esophagus. Lungs/Pleura: There is a moderate-sized right-sided pleural effusion. There is a small left-sided pleural effusion. There are prominent interstitial lung markings bilaterally. No pneumothorax. There are few scattered ground-glass airspace opacities bilaterally. There is a 5 mm pulmonary nodule in the left upper lobe (axial series 6, image 58). Musculoskeletal: There are acute to subacute appearing anterior rib fractures on the right involving the third and fourth ribs. There appears to be a nondisplaced acute fracture involving the anterior second rib on the left. There are old healed anterior left-sided rib fractures. There are healing fractures involving the left seventh eighth ribs anteriorly. CT ABDOMEN PELVIS FINDINGS Hepatobiliary:  Hepatic cysts are noted. Normal gallbladder.There is no biliary ductal dilation. Pancreas: Normal contours without ductal dilatation. No peripancreatic fluid collection. Spleen: Unremarkable. Adrenals/Urinary Tract: --Adrenal glands: Unremarkable. --Right kidney/ureter: Multiple complex and simple appearing cysts are noted involving the right kidney. --Left kidney/ureter: Multiple complex and simple appearing cysts are noted on the patient's left. There is an indeterminate exophytic hyperdense nodule rising from the interpolar region of the left kidney measuring approximately 2.5 cm. This nodule contains multiple calcifications (axial series 3, image 72). --Urinary bladder: Unremarkable. Stomach/Bowel: --Stomach/Duodenum: There is a metallic foreign body in the gastric body measuring approximately 2.1 cm (axial series 3, image 68). --Small bowel: There appears to be mild wall thickening and possible adjacent fat stranding involving the proximal duodenum. --Colon: Rectosigmoid diverticulosis without acute inflammation. --Appendix: Normal. Vascular/Lymphatic: Atherosclerotic calcification is present within the non-aneurysmal abdominal aorta, without hemodynamically significant stenosis. --No retroperitoneal lymphadenopathy. --No mesenteric lymphadenopathy. --No pelvic or inguinal lymphadenopathy. Reproductive: Brachytherapy beads are noted Other: There is nonspecific retroperitoneal fluid along the right psoas muscle. There is a fat containing umbilical hernia. Musculoskeletal. No acute displaced fractures. IMPRESSION: 1. There are bilateral nondisplaced anterior rib fractures without evidence for pneumothorax. 2. There is a metallic foreign body in the gastric body measuring approximately 2.1 cm. This may represent the reported swallowed hearing aid. 3. There appears to be mild wall thickening and possible adjacent fat stranding involving the proximal duodenum. This may represent duodenitis/peptic ulcer disease  versus pancreatitis. There is a small volume of retroperitoneal free fluid is felt to be related. 4. There is a moderate-sized right-sided pleural effusion and a small left-sided pleural effusion. 5. There is a 5 mm pulmonary nodule in the left upper lobe. No follow-up needed if patient is low-risk. Non-contrast chest CT can be considered in 12 months if patient is high-risk. This recommendation follows the consensus statement: Guidelines for Management of Incidental Pulmonary Nodules Detected on CT Images: From the Fleischner Society 2017; Radiology 2017; 284:228-243. 6. There are indeterminate bilateral renal nodules favored to represent hemorrhagic or proteinaceous cysts. Follow-up with a nonemergent outpatient renal ultrasound is recommended. 7. Cardiomegaly and coronary artery calcifications. There are findings suggestive of volume overload and developing pulmonary edema. 8. Anemia. 9. Ascending thoracic aortic aneurysm measuring 4.2 cm. Recommend annual imaging followup by CTA or MRA. This recommendation follows 2010 ACCF/AHA/AATS/ACR/ASA/SCA/SCAI/SIR/STS/SVM Guidelines for the Diagnosis and Management of Patients with Thoracic Aortic Disease. Circulation. 2010; 121: F621-H086. Aortic aneurysm NOS (ICD10-I71.9) 10. No acute fracture involving the thoracic or lumbar spine. Aortic Atherosclerosis (ICD10-I70.0).  Electronically Signed   By: Constance Holster M.D.   On: 12/21/2020 02:31    Impression:  1.  Anemia.  No active GI bleeding.  Hemoccult negative. 2.  Fall with facial and chest trauma, possibly from #1 above. 3.  Rib fractures, from #2 above. 4.  Heart failure suggested on CT scan. 5.  Thoracic aneurysm. 6.  Acute renal failure.  Elevated CK; component of rhabdomyolysis? 7.  Profound hyponatremia. 8.  Inadvertent ingestion of hearing aid ~ 48 hours ago. 9.  Duodenitis, possible peptic ulcer, based on CT scan. 10.  Chronic anticoagulation, apixaban.  Plan:  1.  Medical management of  acute renal failure and hyponatremia. 2.  Supportive care for bony fractures/trauma. 3.  Patient is in no state for endoscopy at this time, and has very high anesthesia-related risks given his profound hyponatremia.  4.  There is a chance that the hearing aid will advance into the more distal bowel while we wait, but I suspect this is not likely as the hearing aid has remained in stomach ~ 48 hours after ingestion.  While typically we would attempt endoscopy today, overall the risks of procedure at this time (anesthesia risks especially, given hyponatremia, and suspected rhabdomyolysis with acute renal failure) far outweigh the benefits. 5.  Would ideally hold Eliquis in anticipation of need for endoscopy in the next 1-2 days. 6.  [facility] will follow; hopefully we can do the endoscopy within the next couple days.   LOS: 0 days   Nicodemus Denk M  12/21/2020, 1:19 PM  Cell [phone] If no answer or after 5 PM call [phone]

## 2020-12-21 NOTE — Consult Note (Signed)
Cardiology Consultation:   Patient ID: [name] [name] MRN: [id number]; DOB: 01/27/1933  Admit date: 12/20/2020 Date of Consult: 12/21/2020  PCP:  Antony Contras, MD   [place]  Cardiologist:  Evalina Field, MD  Advanced Practice Provider:  No care team member to display Electrophysiologist:  None   Patient Profile:   [name] [name] is a 87 y.o. male with a hx of NSTEMI with prior stenting to Lcx/OM '13, HTN, GERD, Prostate CA, persistent Afib who is being seen today for the evaluation of CHF at the request of Dr. Tamala Julian.  History of Present Illness:   [name] [name] is an 87 yo male with PMH noted above. He is quite HOH therefore history is obtained from the daughter at the bedside. He resides mostly in [place] and travels to this area intermittently during the year. Other daughter travels to stay with him in [place]. Most recently has been in [place] staying with the daughter at the bedside. She reports frequent falls, says he is very unsteady on his feet. He has been following with cardiology and nephrology back home. She is unaware of any ischemic work up since he was last seen by Dr. Claiborne Billings here back in 2013 when he presented with a NSTEMI and had BMS to the LCx.  Daughter has requested that the records be sent here.  Over the past couple of days he has had increased falls. Seems very weak. Most recently fell in shower yesterday evening. She called her brother to help and he was unable to get him up. EMS was ultimately called.   Reports he has a lot of back, neck and shoulder pain which is chronic. Recently broke a couple of ribs from a previous fall.   In the ED his labs showed Na+ 122, BUN 80, Cr 4.45, albumin 3.2, AST 63, total bilii 1.3, Hgb 8.7, BNP 969, hsTn 14>13, TSH 2.577. EKG showed rate controlled Afib, RBBB. CT chest/abd/pelvis with metallic foreign body of 2.1 cm in the gastric body, moderate sized right sided pleural effusion with small left-sided  pleural effusion, 5 mm pulmonary nodule with cardiomegaly and coronary calcifications with suggestion of pulmonary edema and 4.2 ascending aortic aneurysm.  He was started on IV fluids 75 mL an hour.  Internal medicine consulted for admission.  IV fluids were stopped and he was placed on IV Lasix 40 mg twice daily.  Nephrology and cardiology consulted in regards to his concern for heart failure/renal failure.   GI was consulted in regards to concern of metallic foreign body in his stomach.  Family reported they thought he accidentally swallowed his hearing aid several days prior.  Given the concern for his acute renal failure and hyponatremia plan is for supportive care with potential need for endoscopy in the next 1 to 2 days.  They have requested that his Eliquis be held in preparation for this.  Past Medical History:  Diagnosis Date  . Acid reflux   . Angina   . CAD (coronary artery disease), stent placed 11/26/11  11/27/2011  . Heart murmur   . Hypertension   . NSTEMI (non-ST elevated myocardial infarction) most likely as outpatient 11/24/2011  . Prostate cancer ([place])   . S/P angioplasty with stent, 11/26/11 distal AVGroove LCX into OM3 with BMS ([facility]) and rescue PTCA on OM2 jailed by stent. 11/27/2011    Past Surgical History:  Procedure Laterality Date  . CATARACT EXTRACTION W/ INTRAOCULAR LENS  IMPLANT, BILATERAL  ~ 2007  . INSERTION PROSTATE  RADIATION SEED  2001  . LEFT HEART CATHETERIZATION WITH CORONARY ANGIOGRAM N/A 11/26/2011   Procedure: LEFT HEART CATHETERIZATION WITH CORONARY ANGIOGRAM;  Surgeon: Sanda Klein, MD;  Location: [facility];  Service: Cardiovascular;  Laterality: N/A;  . STAPEDECTOMY  1960; 1980   left; right     Home Medications:  Prior to Admission medications   Medication Sig Start Date End Date Taking? Authorizing Provider  acetaminophen (TYLENOL) 650 MG CR tablet Take 650 mg by mouth every 8 (eight) hours as needed for pain.   Yes [provider]   allopurinol (ZYLOPRIM) 300 MG tablet Take 300 mg by mouth daily.   Yes [provider]  amLODipine (NORVASC) 5 MG tablet Take 5 mg by mouth daily.   Yes [provider]  apixaban (ELIQUIS) 5 MG TABS tablet Take 5 mg by mouth 2 (two) times daily.   Yes [provider]  aspirin EC 81 MG tablet Take 81 mg by mouth daily.   Yes [provider]  BETA CAROTENE PO Take 750 mcg by mouth daily.   Yes [provider]  bimatoprost (LUMIGAN) 0.03 % ophthalmic solution Place 1 drop into both eyes every evening. 12/07/20  Yes [provider]  carvedilol (COREG) 25 MG tablet Take 25 mg by mouth 2 (two) times daily with a meal.   Yes [provider]  Coenzyme Q10 (CO Q-10) 100 MG CAPS Take 100 mg by mouth daily.   Yes [provider]  losartan (COZAAR) 100 MG tablet Take 100 mg by mouth daily.   Yes [provider]  MELATONIN PO Take 1 tablet by mouth at bedtime as needed.   Yes [provider]  Omega-3 Fatty Acids (FISH OIL) 1000 MG CAPS Take 1,000 mg by mouth daily.   Yes [provider]  omeprazole (PRILOSEC) 20 MG capsule Take 20 mg by mouth daily.   Yes [provider]  rosuvastatin (CRESTOR) 5 MG tablet Take 5 mg by mouth See admin instructions. Take 5mg  daily on Mondays, wednesdays, and fridays   Yes [provider]  vitamin B-12 (CYANOCOBALAMIN) 500 MCG tablet Take 500 mcg by mouth daily.   Yes [provider]  VITAMIN D, CHOLECALCIFEROL, PO Take 250 mcg by mouth daily.   Yes [provider]  vitamin E 180 MG (400 UNITS) capsule Take 400 Units by mouth daily.   Yes [provider]    Inpatient Medications: Scheduled Meds: . amLODipine  5 mg Oral Daily  . carvedilol  25 mg Oral BID WC  . furosemide  40 mg Intravenous BID  . latanoprost  1 drop Both Eyes QHS  . pantoprazole  40 mg Oral Daily  . rosuvastatin  5 mg Oral Q M,W,F  . sodium chloride flush  3 mL  Intravenous Q12H   Continuous Infusions: . sodium chloride     PRN Meds: sodium chloride, acetaminophen, HYDROcodone-acetaminophen, melatonin, ondansetron (ZOFRAN) IV, sodium chloride flush  Allergies:   No Known Allergies  Social History:   Social History   Socioeconomic History  . Marital status: Married    Spouse name: Not on file  . Number of children: Not on file  . Years of education: Not on file  . Highest education level: Not on file  Occupational History  . Not on file  Tobacco Use  . Smoking status: Former Smoker    Packs/day: 0.50    Years: 14.00    Pack years: 7.00    Types: Cigars, Cigarettes  Quit date: 09/17/1978    Years since quitting: 42.2  . Smokeless tobacco: Never Used  Substance and Sexual Activity  . Alcohol use: Yes    Comment: 11/23/11 "haven't drank anything  since ~ 2008"  . Drug use: No  . Sexual activity: Never  Other Topics Concern  . Not on file  Social History Narrative  . Not on file   Social Determinants of Health   Financial Resource Strain: Not on file  Food Insecurity: Not on file  Transportation Needs: Not on file  Physical Activity: Not on file  Stress: Not on file  Social Connections: Not on file  Intimate Partner Violence: Not on file    Family History:    Family History  Problem Relation Age of Onset  . Hypertension Father      ROS:  Please see the history of present illness.   All other ROS reviewed and negative.     Physical Exam/Data:   Vitals:   12/21/20 1000 12/21/20 1131 12/21/20 1245 12/21/20 1415  BP: 120/71 136/76 119/90 130/89  Pulse: 60 64 (!) 56 68  Resp: 17 18 (!) 25 16  Temp:  97.6 F (36.4 C)    TempSrc:  Oral    SpO2: 95% 100% 97% 94%  Weight:      Height:        Intake/Output Summary (Last 24 hours) at 12/21/2020 1534 Last data filed at 12/21/2020 0830 Gross per 24 hour  Intake --  Output 200 ml  Net -200 ml   Last 3 Weights 12/20/2020 11/28/2011 11/27/2011  Weight (lbs) 253 lb 8.5 oz  225 lb 12 oz 231 lb 4.2 oz  Weight (kg) 115 kg 102.4 kg 104.9 kg     Body mass index is 34.38 kg/m.   Physical Exam per MD.  EKG:  The EKG was personally reviewed and demonstrates: Atrial fibrillation, rate controlled, right bundle branch block  Relevant CV Studies:  Study Conclusions   - Left ventricle: The cavity size was normal. There was  moderate concentric hypertrophy. Systolic function was  mildly reduced. The estimated ejection fraction was in the  range of 45% to 50%. Moderate hypokinesis of the  basal-midinferior and inferoseptal myocardium. Atrial  fibrillation limits evaluation of LV diastolic function.  E/e'<8 consistent with normal left atrial pressure.  - Left atrium: The atrium was mildly dilated.    Laboratory Data:  High Sensitivity Troponin:   Recent Labs  Lab 12/21/20 0007 12/21/20 0200  TROPONINIHS 14 13     Chemistry Recent Labs  Lab 12/21/20 0007 12/21/20 0200 12/21/20 0848  NA 122* 122* 121*  K 4.9 4.8 4.6  CL 92* 92* 91*  CO2 20* 18* 20*  GLUCOSE 118* 107* 86  BUN 80* 80* 79*  CREATININE 4.45* 4.32* 4.36*  CALCIUM 10.8* 10.5* 10.0  GFRNONAA 12* 13* 13*  ANIONGAP 10 12 10     Recent Labs  Lab 12/21/20 0007  PROT 5.8*  ALBUMIN 3.2*  AST 63*  ALT 41  ALKPHOS 84  BILITOT 1.3*   Hematology Recent Labs  Lab 12/21/20 0007  WBC 10.3  RBC 2.80*  HGB 8.7*  HCT 25.5*  MCV 91.1  MCH 31.1  MCHC 34.1  RDW 15.0  PLT 140*   BNP Recent Labs  Lab 12/21/20 1203  BNP 969.4*    DDimer No results for input(s): DDIMER in the last 168 hours.   Radiology/Studies:  CT Head Wo Contrast  Result Date: 12/21/2020 CLINICAL DATA:  Fall EXAM:  CT HEAD WITHOUT CONTRAST TECHNIQUE: Contiguous axial images were obtained from the base of the skull through the vertex without intravenous contrast. COMPARISON:  None. FINDINGS: Brain: There is atrophy and chronic small vessel disease changes. No acute intracranial abnormality.  Specifically, no hemorrhage, hydrocephalus, mass lesion, acute infarction, or significant intracranial injury. Vascular: No hyperdense vessel or unexpected calcification. Skull: No acute calvarial abnormality. Sinuses/Orbits: No acute findings Other: None IMPRESSION: Atrophy, chronic microvascular disease. No acute intracranial abnormality. Electronically Signed   By: Rolm Baptise M.D.   On: 12/21/2020 01:47   CT Cervical Spine Wo Contrast  Result Date: 12/21/2020 CLINICAL DATA:  Fall EXAM: CT CERVICAL SPINE WITHOUT CONTRAST TECHNIQUE: Multidetector CT imaging of the cervical spine was performed without intravenous contrast. Multiplanar CT image reconstructions were also generated. COMPARISON:  None. FINDINGS: Alignment: Slight anterolisthesis of C4 on C5 related to facet disease. Skull base and vertebrae: No acute fracture. No primary bone lesion or focal pathologic process. Soft tissues and spinal canal: No prevertebral fluid or swelling. No visible canal hematoma. Disc levels: Diffuse degenerative disc disease, most pronounced in the lower lumbar spine. Diffuse degenerative facet disease bilaterally. Upper chest: Right pleural effusion partially imaged. Other: None IMPRESSION: Degenerative changes.  No acute bony abnormality. Right pleural effusion. Electronically Signed   By: Rolm Baptise M.D.   On: 12/21/2020 01:49   CT T-SPINE NO CHARGE  Result Date: 12/21/2020 CLINICAL DATA:  Acute pain due to trauma. Bilateral shoulder pain. Possible swallowed foreign body. EXAM: CT CHEST, ABDOMEN AND PELVIS WITHOUT CONTRAST CT THORACIC AND LUMBAR SPINE WITHOUT CONTRAST TECHNIQUE: Multidetector CT imaging of the chest, abdomen and pelvis was performed following the standard protocol without IV contrast. Multiplanar CT images of the thoracic and lumbar spine were reconstructed from contemporary CT of the Chest, Abdomen, and Pelvis COMPARISON:  None. FINDINGS: CT CHEST FINDINGS Cardiovascular: Atherosclerotic changes are  noted of the thoracic aorta. There is an ascending thoracic aortic aneurysm measuring approximately 4.2 cm. The heart size is enlarged. Coronary artery calcifications are noted. There is a trace pericardial effusion. The intracardiac blood pool is hypodense relative to the adjacent myocardium consistent with anemia. Mediastinum/Nodes: -- No mediastinal lymphadenopathy. -- No hilar lymphadenopathy. -- No axillary lymphadenopathy. -- No supraclavicular lymphadenopathy. -- Normal thyroid gland where visualized. -  Unremarkable esophagus. Lungs/Pleura: There is a moderate-sized right-sided pleural effusion. There is a small left-sided pleural effusion. There are prominent interstitial lung markings bilaterally. No pneumothorax. There are few scattered ground-glass airspace opacities bilaterally. There is a 5 mm pulmonary nodule in the left upper lobe (axial series 6, image 58). Musculoskeletal: There are acute to subacute appearing anterior rib fractures on the right involving the third and fourth ribs. There appears to be a nondisplaced acute fracture involving the anterior second rib on the left. There are old healed anterior left-sided rib fractures. There are healing fractures involving the left seventh eighth ribs anteriorly. CT ABDOMEN PELVIS FINDINGS Hepatobiliary: Hepatic cysts are noted. Normal gallbladder.There is no biliary ductal dilation. Pancreas: Normal contours without ductal dilatation. No peripancreatic fluid collection. Spleen: Unremarkable. Adrenals/Urinary Tract: --Adrenal glands: Unremarkable. --Right kidney/ureter: Multiple complex and simple appearing cysts are noted involving the right kidney. --Left kidney/ureter: Multiple complex and simple appearing cysts are noted on the patient's left. There is an indeterminate exophytic hyperdense nodule rising from the interpolar region of the left kidney measuring approximately 2.5 cm. This nodule contains multiple calcifications (axial series 3, image  72). --Urinary bladder: Unremarkable. Stomach/Bowel: --Stomach/Duodenum: There is a metallic foreign  body in the gastric body measuring approximately 2.1 cm (axial series 3, image 68). --Small bowel: There appears to be mild wall thickening and possible adjacent fat stranding involving the proximal duodenum. --Colon: Rectosigmoid diverticulosis without acute inflammation. --Appendix: Normal. Vascular/Lymphatic: Atherosclerotic calcification is present within the non-aneurysmal abdominal aorta, without hemodynamically significant stenosis. --No retroperitoneal lymphadenopathy. --No mesenteric lymphadenopathy. --No pelvic or inguinal lymphadenopathy. Reproductive: Brachytherapy beads are noted Other: There is nonspecific retroperitoneal fluid along the right psoas muscle. There is a fat containing umbilical hernia. Musculoskeletal. No acute displaced fractures. IMPRESSION: 1. There are bilateral nondisplaced anterior rib fractures without evidence for pneumothorax. 2. There is a metallic foreign body in the gastric body measuring approximately 2.1 cm. This may represent the reported swallowed hearing aid. 3. There appears to be mild wall thickening and possible adjacent fat stranding involving the proximal duodenum. This may represent duodenitis/peptic ulcer disease versus pancreatitis. There is a small volume of retroperitoneal free fluid is felt to be related. 4. There is a moderate-sized right-sided pleural effusion and a small left-sided pleural effusion. 5. There is a 5 mm pulmonary nodule in the left upper lobe. No follow-up needed if patient is low-risk. Non-contrast chest CT can be considered in 12 months if patient is high-risk. This recommendation follows the consensus statement: Guidelines for Management of Incidental Pulmonary Nodules Detected on CT Images: From the Fleischner Society 2017; Radiology 2017; 284:228-243. 6. There are indeterminate bilateral renal nodules favored to represent hemorrhagic or  proteinaceous cysts. Follow-up with a nonemergent outpatient renal ultrasound is recommended. 7. Cardiomegaly and coronary artery calcifications. There are findings suggestive of volume overload and developing pulmonary edema. 8. Anemia. 9. Ascending thoracic aortic aneurysm measuring 4.2 cm. Recommend annual imaging followup by CTA or MRA. This recommendation follows 2010 ACCF/AHA/AATS/ACR/ASA/SCA/SCAI/SIR/STS/SVM Guidelines for the Diagnosis and Management of Patients with Thoracic Aortic Disease. Circulation. 2010; 121: T701-X793. Aortic aneurysm NOS (ICD10-I71.9) 10. No acute fracture involving the thoracic or lumbar spine. Aortic Atherosclerosis (ICD10-I70.0). Electronically Signed   By: Constance Holster M.D.   On: 12/21/2020 02:31   CT L-SPINE NO CHARGE  Result Date: 12/21/2020 CLINICAL DATA:  Acute pain due to trauma. Bilateral shoulder pain. Possible swallowed foreign body. EXAM: CT CHEST, ABDOMEN AND PELVIS WITHOUT CONTRAST CT THORACIC AND LUMBAR SPINE WITHOUT CONTRAST TECHNIQUE: Multidetector CT imaging of the chest, abdomen and pelvis was performed following the standard protocol without IV contrast. Multiplanar CT images of the thoracic and lumbar spine were reconstructed from contemporary CT of the Chest, Abdomen, and Pelvis COMPARISON:  None. FINDINGS: CT CHEST FINDINGS Cardiovascular: Atherosclerotic changes are noted of the thoracic aorta. There is an ascending thoracic aortic aneurysm measuring approximately 4.2 cm. The heart size is enlarged. Coronary artery calcifications are noted. There is a trace pericardial effusion. The intracardiac blood pool is hypodense relative to the adjacent myocardium consistent with anemia. Mediastinum/Nodes: -- No mediastinal lymphadenopathy. -- No hilar lymphadenopathy. -- No axillary lymphadenopathy. -- No supraclavicular lymphadenopathy. -- Normal thyroid gland where visualized. -  Unremarkable esophagus. Lungs/Pleura: There is a moderate-sized right-sided  pleural effusion. There is a small left-sided pleural effusion. There are prominent interstitial lung markings bilaterally. No pneumothorax. There are few scattered ground-glass airspace opacities bilaterally. There is a 5 mm pulmonary nodule in the left upper lobe (axial series 6, image 58). Musculoskeletal: There are acute to subacute appearing anterior rib fractures on the right involving the third and fourth ribs. There appears to be a nondisplaced acute fracture involving the anterior second rib on the left. There  are old healed anterior left-sided rib fractures. There are healing fractures involving the left seventh eighth ribs anteriorly. CT ABDOMEN PELVIS FINDINGS Hepatobiliary: Hepatic cysts are noted. Normal gallbladder.There is no biliary ductal dilation. Pancreas: Normal contours without ductal dilatation. No peripancreatic fluid collection. Spleen: Unremarkable. Adrenals/Urinary Tract: --Adrenal glands: Unremarkable. --Right kidney/ureter: Multiple complex and simple appearing cysts are noted involving the right kidney. --Left kidney/ureter: Multiple complex and simple appearing cysts are noted on the patient's left. There is an indeterminate exophytic hyperdense nodule rising from the interpolar region of the left kidney measuring approximately 2.5 cm. This nodule contains multiple calcifications (axial series 3, image 72). --Urinary bladder: Unremarkable. Stomach/Bowel: --Stomach/Duodenum: There is a metallic foreign body in the gastric body measuring approximately 2.1 cm (axial series 3, image 68). --Small bowel: There appears to be mild wall thickening and possible adjacent fat stranding involving the proximal duodenum. --Colon: Rectosigmoid diverticulosis without acute inflammation. --Appendix: Normal. Vascular/Lymphatic: Atherosclerotic calcification is present within the non-aneurysmal abdominal aorta, without hemodynamically significant stenosis. --No retroperitoneal lymphadenopathy. --No  mesenteric lymphadenopathy. --No pelvic or inguinal lymphadenopathy. Reproductive: Brachytherapy beads are noted Other: There is nonspecific retroperitoneal fluid along the right psoas muscle. There is a fat containing umbilical hernia. Musculoskeletal. No acute displaced fractures. IMPRESSION: 1. There are bilateral nondisplaced anterior rib fractures without evidence for pneumothorax. 2. There is a metallic foreign body in the gastric body measuring approximately 2.1 cm. This may represent the reported swallowed hearing aid. 3. There appears to be mild wall thickening and possible adjacent fat stranding involving the proximal duodenum. This may represent duodenitis/peptic ulcer disease versus pancreatitis. There is a small volume of retroperitoneal free fluid is felt to be related. 4. There is a moderate-sized right-sided pleural effusion and a small left-sided pleural effusion. 5. There is a 5 mm pulmonary nodule in the left upper lobe. No follow-up needed if patient is low-risk. Non-contrast chest CT can be considered in 12 months if patient is high-risk. This recommendation follows the consensus statement: Guidelines for Management of Incidental Pulmonary Nodules Detected on CT Images: From the Fleischner Society 2017; Radiology 2017; 284:228-243. 6. There are indeterminate bilateral renal nodules favored to represent hemorrhagic or proteinaceous cysts. Follow-up with a nonemergent outpatient renal ultrasound is recommended. 7. Cardiomegaly and coronary artery calcifications. There are findings suggestive of volume overload and developing pulmonary edema. 8. Anemia. 9. Ascending thoracic aortic aneurysm measuring 4.2 cm. Recommend annual imaging followup by CTA or MRA. This recommendation follows 2010 ACCF/AHA/AATS/ACR/ASA/SCA/SCAI/SIR/STS/SVM Guidelines for the Diagnosis and Management of Patients with Thoracic Aortic Disease. Circulation. 2010; 121: B846-K599. Aortic aneurysm NOS (ICD10-I71.9) 10. No acute  fracture involving the thoracic or lumbar spine. Aortic Atherosclerosis (ICD10-I70.0). Electronically Signed   By: Constance Holster M.D.   On: 12/21/2020 02:31   CT CHEST ABDOMEN PELVIS WO CONTRAST  Result Date: 12/21/2020 CLINICAL DATA:  Acute pain due to trauma. Bilateral shoulder pain. Possible swallowed foreign body. EXAM: CT CHEST, ABDOMEN AND PELVIS WITHOUT CONTRAST CT THORACIC AND LUMBAR SPINE WITHOUT CONTRAST TECHNIQUE: Multidetector CT imaging of the chest, abdomen and pelvis was performed following the standard protocol without IV contrast. Multiplanar CT images of the thoracic and lumbar spine were reconstructed from contemporary CT of the Chest, Abdomen, and Pelvis COMPARISON:  None. FINDINGS: CT CHEST FINDINGS Cardiovascular: Atherosclerotic changes are noted of the thoracic aorta. There is an ascending thoracic aortic aneurysm measuring approximately 4.2 cm. The heart size is enlarged. Coronary artery calcifications are noted. There is a trace pericardial effusion. The intracardiac blood pool is  hypodense relative to the adjacent myocardium consistent with anemia. Mediastinum/Nodes: -- No mediastinal lymphadenopathy. -- No hilar lymphadenopathy. -- No axillary lymphadenopathy. -- No supraclavicular lymphadenopathy. -- Normal thyroid gland where visualized. -  Unremarkable esophagus. Lungs/Pleura: There is a moderate-sized right-sided pleural effusion. There is a small left-sided pleural effusion. There are prominent interstitial lung markings bilaterally. No pneumothorax. There are few scattered ground-glass airspace opacities bilaterally. There is a 5 mm pulmonary nodule in the left upper lobe (axial series 6, image 58). Musculoskeletal: There are acute to subacute appearing anterior rib fractures on the right involving the third and fourth ribs. There appears to be a nondisplaced acute fracture involving the anterior second rib on the left. There are old healed anterior left-sided rib fractures.  There are healing fractures involving the left seventh eighth ribs anteriorly. CT ABDOMEN PELVIS FINDINGS Hepatobiliary: Hepatic cysts are noted. Normal gallbladder.There is no biliary ductal dilation. Pancreas: Normal contours without ductal dilatation. No peripancreatic fluid collection. Spleen: Unremarkable. Adrenals/Urinary Tract: --Adrenal glands: Unremarkable. --Right kidney/ureter: Multiple complex and simple appearing cysts are noted involving the right kidney. --Left kidney/ureter: Multiple complex and simple appearing cysts are noted on the patient's left. There is an indeterminate exophytic hyperdense nodule rising from the interpolar region of the left kidney measuring approximately 2.5 cm. This nodule contains multiple calcifications (axial series 3, image 72). --Urinary bladder: Unremarkable. Stomach/Bowel: --Stomach/Duodenum: There is a metallic foreign body in the gastric body measuring approximately 2.1 cm (axial series 3, image 68). --Small bowel: There appears to be mild wall thickening and possible adjacent fat stranding involving the proximal duodenum. --Colon: Rectosigmoid diverticulosis without acute inflammation. --Appendix: Normal. Vascular/Lymphatic: Atherosclerotic calcification is present within the non-aneurysmal abdominal aorta, without hemodynamically significant stenosis. --No retroperitoneal lymphadenopathy. --No mesenteric lymphadenopathy. --No pelvic or inguinal lymphadenopathy. Reproductive: Brachytherapy beads are noted Other: There is nonspecific retroperitoneal fluid along the right psoas muscle. There is a fat containing umbilical hernia. Musculoskeletal. No acute displaced fractures. IMPRESSION: 1. There are bilateral nondisplaced anterior rib fractures without evidence for pneumothorax. 2. There is a metallic foreign body in the gastric body measuring approximately 2.1 cm. This may represent the reported swallowed hearing aid. 3. There appears to be mild wall thickening and  possible adjacent fat stranding involving the proximal duodenum. This may represent duodenitis/peptic ulcer disease versus pancreatitis. There is a small volume of retroperitoneal free fluid is felt to be related. 4. There is a moderate-sized right-sided pleural effusion and a small left-sided pleural effusion. 5. There is a 5 mm pulmonary nodule in the left upper lobe. No follow-up needed if patient is low-risk. Non-contrast chest CT can be considered in 12 months if patient is high-risk. This recommendation follows the consensus statement: Guidelines for Management of Incidental Pulmonary Nodules Detected on CT Images: From the Fleischner Society 2017; Radiology 2017; 284:228-243. 6. There are indeterminate bilateral renal nodules favored to represent hemorrhagic or proteinaceous cysts. Follow-up with a nonemergent outpatient renal ultrasound is recommended. 7. Cardiomegaly and coronary artery calcifications. There are findings suggestive of volume overload and developing pulmonary edema. 8. Anemia. 9. Ascending thoracic aortic aneurysm measuring 4.2 cm. Recommend annual imaging followup by CTA or MRA. This recommendation follows 2010 ACCF/AHA/AATS/ACR/ASA/SCA/SCAI/SIR/STS/SVM Guidelines for the Diagnosis and Management of Patients with Thoracic Aortic Disease. Circulation. 2010; 121: I967-E938. Aortic aneurysm NOS (ICD10-I71.9) 10. No acute fracture involving the thoracic or lumbar spine. Aortic Atherosclerosis (ICD10-I70.0). Electronically Signed   By: Constance Holster M.D.   On: 12/21/2020 02:31     Assessment and Plan:  Awab Abebe is a 87 y.o. male with a hx of NSTEMI with prior stenting to Lcx/OM '13, HTN, GERD, Prostate CA, persistent Afib who is being seen today for the evaluation of CHF at the request of Dr. Tamala Julian.  1. CHF in the setting Acute renal failure with hx of CKD: his baseline Cr is unclear.  He follows with nephrology back in [place].  Daughter has requested that records be sent  here.  Admission creatinine of 4.4.  He does not appear to be in acute heart failure as he has no JVD or lower extremity edema on exam.  He is warm and dry.  Suspect this may be more so renally driven. BNP is up at 969, CT chest abdomen pelvis with moderate right-sided pleural effusion and small left-sided pleural effusion -- started on IV lasix 40mg  BID --Nephrology has been consulted by primary.  Will defer further diuretic management to them -- echo  2.  Persistent atrial fibrillation: He has been managed with Eliquis 2.5 mg twice daily prior to admission --Rates are controlled on telemetry --Would continue with beta-blocker therapy --Eliquis has been held in preparation for possible endoscopy  3.  History of CAD/non-STEMI with stenting of circumflex '13: Daughter reports he follows with cardiology back in [place].  Of any ischemic work-up since he was last seen here in 2013 --No chest pain on admission, flat low level high-sensitivity troponin  4. HTN: blood pressures are stable in the ED -- continued on beta-blocker and amlodipine  5.  Hyponatremia: Sodium 122.  Family reports he has been taking in significant amounts of p.o. fluids over the past 2 weeks. --Further management via primary  6.  Foreign body in gastric body: Evaluated by GI with concern that he swallowed his hearing aid prior to admission. --Recommendations for conservative therapy at present given his hyponatremia and renal failure --Eliquis held in anticipation for endoscopy once he is medically stable  7.  Fall with rib fracture: Family reports he has had frequent falls.  Prior rib fractures.  May need to reevaluate risk versus benefit of stroke prevention with anticoagulation  8.  Iron deficiency anemia: Hemoglobin 8.7, iron 37 -- per primary  9.  Bilateral pleural effusions: Noted on CT. Has been started on IV Lasix 40 mg twice daily   Risk Assessment/Risk Scores:    CHA2DS2-VASc Score = 5  This indicates a  7.2% annual risk of stroke. The patient's score is based upon: CHF History: Yes HTN History: Yes Diabetes History: No Stroke History: No Vascular Disease History: Yes Age Score: 2 Gender Score: 0   For questions or updates, please contact [place] Please consult [email or web address] for contact info under    Signed, Reino Bellis, NP  12/21/2020 3:34 PM

## 2020-12-21 NOTE — Consult Note (Signed)
[facility] Consultation Note  Referring Provider: [facility] Primary Care Physician:  Antony Contras, MD  Reason for Consultation:  Anemia, abnormal CT abdomen  HPI: [name] [name] is a 87 y.o. male came to ED after fall with chest and facial trauma.  Most of history from patient's son, [name], who is at the bedside.  Patient was found to be anemic but hemoccult negative.  Patient has presbyacusis and his hearing aid was "lost" a couple days ago; on imaging here in ED, after his evaluation for trauma protocol, a metallic object (presumably the hearing aid) is in the stomach.  Also some duodenitis (possible peptic ulcer) in the proximal duodenum.  Patient denies hematemesis, abdominal pain, blood in stool.  No known prior endoscopy/colonoscopy.  He had multiple other lab dyscrasias and imaging abnormalities seen, including profound hyponatremia, elevated CK, acute renal failure, bilateral anterior rib fractures.   Past Medical History:  Diagnosis Date  . Acid reflux   . Angina   . CAD (coronary artery disease), stent placed 11/26/11  11/27/2011  . Heart murmur   . Hypertension   . NSTEMI (non-ST elevated myocardial infarction) most likely as outpatient 11/24/2011  . Prostate cancer ([place])   . S/P angioplasty with stent, 11/26/11 distal AVGroove LCX into OM3 with BMS ([facility]) and rescue PTCA on OM2 jailed by stent. 11/27/2011    Past Surgical History:  Procedure Laterality Date  . CATARACT EXTRACTION W/ INTRAOCULAR LENS  IMPLANT, BILATERAL  ~ 2007  . INSERTION PROSTATE RADIATION SEED  2001  . LEFT HEART CATHETERIZATION WITH CORONARY ANGIOGRAM N/A 11/26/2011   Procedure: LEFT HEART CATHETERIZATION WITH CORONARY ANGIOGRAM;  Surgeon: Sanda Klein, MD;  Location: [facility];  Service: Cardiovascular;  Laterality: N/A;  . STAPEDECTOMY  1960; 1980   left; right    Prior to Admission medications   Medication Sig Start Date End Date Taking? Authorizing Provider  acetaminophen  (TYLENOL) 650 MG CR tablet Take 650 mg by mouth every 8 (eight) hours as needed for pain.   Yes [provider]  allopurinol (ZYLOPRIM) 300 MG tablet Take 300 mg by mouth daily.   Yes [provider]  amLODipine (NORVASC) 5 MG tablet Take 5 mg by mouth daily.   Yes [provider]  apixaban (ELIQUIS) 5 MG TABS tablet Take 5 mg by mouth 2 (two) times daily.   Yes [provider]  aspirin EC 81 MG tablet Take 81 mg by mouth daily.   Yes [provider]  BETA CAROTENE PO Take 750 mcg by mouth daily.   Yes [provider]  bimatoprost (LUMIGAN) 0.03 % ophthalmic solution Place 1 drop into both eyes every evening. 12/07/20  Yes [provider]  carvedilol (COREG) 25 MG tablet Take 25 mg by mouth 2 (two) times daily with a meal.   Yes [provider]  Coenzyme Q10 (CO Q-10) 100 MG CAPS Take 100 mg by mouth daily.   Yes [provider]  losartan (COZAAR) 100 MG tablet Take 100 mg by mouth daily.   Yes [provider]  MELATONIN PO Take 1 tablet by mouth at bedtime as needed.   Yes [provider]  Omega-3 Fatty Acids (FISH OIL) 1000 MG CAPS Take 1,000 mg by mouth daily.   Yes [provider]  omeprazole (PRILOSEC) 20 MG capsule Take 20 mg by mouth daily.   Yes [provider]  rosuvastatin (CRESTOR) 5 MG tablet Take 5 mg by mouth See admin instructions. Take 5mg  daily  on Mondays, wednesdays, and fridays   Yes [provider]  vitamin B-12 (CYANOCOBALAMIN) 500 MCG tablet Take 500 mcg by mouth daily.   Yes [provider]  VITAMIN D, CHOLECALCIFEROL, PO Take 250 mcg by mouth daily.   Yes [provider]  vitamin E 180 MG (400 UNITS) capsule Take 400 Units by mouth daily.   Yes [provider]    Current Facility-Administered Medications  Medication Dose Route Frequency Provider Last Rate Last Admin  . 0.9 %  sodium chloride infusion  250 mL Intravenous  PRN Fuller Plan A, MD      . acetaminophen (TYLENOL) tablet 650 mg  650 mg Oral Q4H PRN Smith, Rondell A, MD      . amLODipine (NORVASC) tablet 5 mg  5 mg Oral Daily Smith, Rondell A, MD      . carvedilol (COREG) tablet 25 mg  25 mg Oral BID WC Smith, Rondell A, MD      . furosemide (LASIX) injection 40 mg  40 mg Intravenous BID Fuller Plan A, MD   40 mg at 12/21/20 0817  . HYDROcodone-acetaminophen (NORCO/VICODIN) 5-325 MG per tablet 1 tablet  1 tablet Oral Q6H PRN Smith, Rondell A, MD      . latanoprost (XALATAN) 0.005 % ophthalmic solution 1 drop  1 drop Both Eyes QHS Smith, Rondell A, MD      . melatonin tablet 5 mg  5 mg Oral QHS PRN Tamala Julian, Rondell A, MD      . ondansetron (ZOFRAN) injection 4 mg  4 mg Intravenous Q6H PRN Smith, Rondell A, MD      . pantoprazole (PROTONIX) EC tablet 40 mg  40 mg Oral Daily Smith, Rondell A, MD      . rosuvastatin (CRESTOR) tablet 5 mg  5 mg Oral Q M,W,F Smith, Rondell A, MD      . sodium chloride flush (NS) 0.9 % injection 3 mL  3 mL Intravenous Q12H Smith, Rondell A, MD   3 mL at 12/21/20 1115  . sodium chloride flush (NS) 0.9 % injection 3 mL  3 mL Intravenous PRN Norval Morton, MD       Current Outpatient Medications  Medication Sig Dispense Refill  . acetaminophen (TYLENOL) 650 MG CR tablet Take 650 mg by mouth every 8 (eight) hours as needed for pain.    Marland Kitchen allopurinol (ZYLOPRIM) 300 MG tablet Take 300 mg by mouth daily.    Marland Kitchen amLODipine (NORVASC) 5 MG tablet Take 5 mg by mouth daily.    Marland Kitchen apixaban (ELIQUIS) 5 MG TABS tablet Take 5 mg by mouth 2 (two) times daily.    Marland Kitchen aspirin EC 81 MG tablet Take 81 mg by mouth daily.    Marland Kitchen BETA CAROTENE PO Take 750 mcg by mouth daily.    . bimatoprost (LUMIGAN) 0.03 % ophthalmic solution Place 1 drop into both eyes every evening.    . carvedilol (COREG) 25 MG tablet Take 25 mg by mouth 2 (two) times daily with a meal.    . Coenzyme Q10 (CO Q-10) 100 MG CAPS Take 100 mg by mouth daily.    Marland Kitchen losartan (COZAAR)  100 MG tablet Take 100 mg by mouth daily.    Marland Kitchen MELATONIN PO Take 1 tablet by mouth at bedtime as needed.    . Omega-3 Fatty Acids (FISH OIL) 1000 MG CAPS Take 1,000 mg by mouth daily.    Marland Kitchen omeprazole (PRILOSEC) 20 MG capsule Take 20 mg by mouth daily.    Marland Kitchen  rosuvastatin (CRESTOR) 5 MG tablet Take 5 mg by mouth See admin instructions. Take 5mg  daily on Mondays, wednesdays, and fridays    . vitamin B-12 (CYANOCOBALAMIN) 500 MCG tablet Take 500 mcg by mouth daily.    Marland Kitchen VITAMIN D, CHOLECALCIFEROL, PO Take 250 mcg by mouth daily.    . vitamin E 180 MG (400 UNITS) capsule Take 400 Units by mouth daily.      Allergies as of 12/20/2020  . (No Known Allergies)    No family history on file.  Social History   Socioeconomic History  . Marital status: Married    Spouse name: Not on file  . Number of children: Not on file  . Years of education: Not on file  . Highest education level: Not on file  Occupational History  . Not on file  Tobacco Use  . Smoking status: Former Smoker    Packs/day: 0.50    Years: 14.00    Pack years: 7.00    Types: Cigars, Cigarettes    Quit date: 09/17/1978    Years since quitting: 42.2  . Smokeless tobacco: Never Used  Substance and Sexual Activity  . Alcohol use: Yes    Comment: 11/23/11 "haven't drank anything  since ~ 2008"  . Drug use: No  . Sexual activity: Never  Other Topics Concern  . Not on file  Social History Narrative  . Not on file   Social Determinants of Health   Financial Resource Strain: Not on file  Food Insecurity: Not on file  Transportation Needs: Not on file  Physical Activity: Not on file  Stress: Not on file  Social Connections: Not on file  Intimate Partner Violence: Not on file    Review of Systems: As per HPI, all others negative  Physical Exam: Vital signs in last 24 hours: Temp:  [96.8 F (36 C)-97.6 F (36.4 C)] 97.6 F (36.4 C) (04/06 1131) Pulse Rate:  [28-87] 56 (04/06 1245) Resp:  [13-25] 25 (04/06 1245) BP:  (116-162)/(69-110) 119/90 (04/06 1245) SpO2:  [90 %-100 %] 97 % (04/06 1245) Weight:  [166 kg] 115 kg (04/05 2325)   General:   Alert,presbyacusis, trauma left eye Head:  Normocephalic and atraumatic. Eyes:  Sclera clear, no icterus.   Conjunctiva pale Ears:  Presbyacusis Nose:  No deformity, discharge,  or lesions. Mouth:  No deformity or lesions.  Oropharynx pale and dry Neck:  Supple; no masses or thyromegaly. Abdomen:  Soft, nontender and nondistended. No masses, hepatosplenomegaly or hernias noted. Normal bowel sounds, without guarding, and without rebound.     Msk:  Symmetrical without gross deformities. Normal posture. Pulses:  Normal pulses noted. Extremities:  Without clubbing or edema. Neurologic:  Alert and  oriented x4;  Skin:  Scattered ecchymoses, otherwise intact without significant lesions or rashes. Psych:  Alert and cooperative. Normal mood and affect.   Lab Results: Recent Labs    12/21/20 0007  WBC 10.3  HGB 8.7*  HCT 25.5*  PLT 140*   BMET Recent Labs    12/21/20 0007 12/21/20 0200 12/21/20 0848  NA 122* 122* 121*  K 4.9 4.8 4.6  CL 92* 92* 91*  CO2 20* 18* 20*  GLUCOSE 118* 107* 86  BUN 80* 80* 79*  CREATININE 4.45* 4.32* 4.36*  CALCIUM 10.8* 10.5* 10.0   LFT Recent Labs    12/21/20 0007  PROT 5.8*  ALBUMIN 3.2*  AST 63*  ALT 41  ALKPHOS 84  BILITOT 1.3*   PT/INR No results for input(s):  LABPROT, INR in the last 72 hours.  Studies/Results: CT Head Wo Contrast  Result Date: 12/21/2020 CLINICAL DATA:  Fall EXAM: CT HEAD WITHOUT CONTRAST TECHNIQUE: Contiguous axial images were obtained from the base of the skull through the vertex without intravenous contrast. COMPARISON:  None. FINDINGS: Brain: There is atrophy and chronic small vessel disease changes. No acute intracranial abnormality. Specifically, no hemorrhage, hydrocephalus, mass lesion, acute infarction, or significant intracranial injury. Vascular: No hyperdense vessel or  unexpected calcification. Skull: No acute calvarial abnormality. Sinuses/Orbits: No acute findings Other: None IMPRESSION: Atrophy, chronic microvascular disease. No acute intracranial abnormality. Electronically Signed   By: Rolm Baptise M.D.   On: 12/21/2020 01:47   CT Cervical Spine Wo Contrast  Result Date: 12/21/2020 CLINICAL DATA:  Fall EXAM: CT CERVICAL SPINE WITHOUT CONTRAST TECHNIQUE: Multidetector CT imaging of the cervical spine was performed without intravenous contrast. Multiplanar CT image reconstructions were also generated. COMPARISON:  None. FINDINGS: Alignment: Slight anterolisthesis of C4 on C5 related to facet disease. Skull base and vertebrae: No acute fracture. No primary bone lesion or focal pathologic process. Soft tissues and spinal canal: No prevertebral fluid or swelling. No visible canal hematoma. Disc levels: Diffuse degenerative disc disease, most pronounced in the lower lumbar spine. Diffuse degenerative facet disease bilaterally. Upper chest: Right pleural effusion partially imaged. Other: None IMPRESSION: Degenerative changes.  No acute bony abnormality. Right pleural effusion. Electronically Signed   By: Rolm Baptise M.D.   On: 12/21/2020 01:49   CT T-SPINE NO CHARGE  Result Date: 12/21/2020 CLINICAL DATA:  Acute pain due to trauma. Bilateral shoulder pain. Possible swallowed foreign body. EXAM: CT CHEST, ABDOMEN AND PELVIS WITHOUT CONTRAST CT THORACIC AND LUMBAR SPINE WITHOUT CONTRAST TECHNIQUE: Multidetector CT imaging of the chest, abdomen and pelvis was performed following the standard protocol without IV contrast. Multiplanar CT images of the thoracic and lumbar spine were reconstructed from contemporary CT of the Chest, Abdomen, and Pelvis COMPARISON:  None. FINDINGS: CT CHEST FINDINGS Cardiovascular: Atherosclerotic changes are noted of the thoracic aorta. There is an ascending thoracic aortic aneurysm measuring approximately 4.2 cm. The heart size is enlarged. Coronary  artery calcifications are noted. There is a trace pericardial effusion. The intracardiac blood pool is hypodense relative to the adjacent myocardium consistent with anemia. Mediastinum/Nodes: -- No mediastinal lymphadenopathy. -- No hilar lymphadenopathy. -- No axillary lymphadenopathy. -- No supraclavicular lymphadenopathy. -- Normal thyroid gland where visualized. -  Unremarkable esophagus. Lungs/Pleura: There is a moderate-sized right-sided pleural effusion. There is a small left-sided pleural effusion. There are prominent interstitial lung markings bilaterally. No pneumothorax. There are few scattered ground-glass airspace opacities bilaterally. There is a 5 mm pulmonary nodule in the left upper lobe (axial series 6, image 58). Musculoskeletal: There are acute to subacute appearing anterior rib fractures on the right involving the third and fourth ribs. There appears to be a nondisplaced acute fracture involving the anterior second rib on the left. There are old healed anterior left-sided rib fractures. There are healing fractures involving the left seventh eighth ribs anteriorly. CT ABDOMEN PELVIS FINDINGS Hepatobiliary: Hepatic cysts are noted. Normal gallbladder.There is no biliary ductal dilation. Pancreas: Normal contours without ductal dilatation. No peripancreatic fluid collection. Spleen: Unremarkable. Adrenals/Urinary Tract: --Adrenal glands: Unremarkable. --Right kidney/ureter: Multiple complex and simple appearing cysts are noted involving the right kidney. --Left kidney/ureter: Multiple complex and simple appearing cysts are noted on the patient's left. There is an indeterminate exophytic hyperdense nodule rising from the interpolar region of the left kidney measuring approximately 2.5  cm. This nodule contains multiple calcifications (axial series 3, image 72). --Urinary bladder: Unremarkable. Stomach/Bowel: --Stomach/Duodenum: There is a metallic foreign body in the gastric body measuring  approximately 2.1 cm (axial series 3, image 68). --Small bowel: There appears to be mild wall thickening and possible adjacent fat stranding involving the proximal duodenum. --Colon: Rectosigmoid diverticulosis without acute inflammation. --Appendix: Normal. Vascular/Lymphatic: Atherosclerotic calcification is present within the non-aneurysmal abdominal aorta, without hemodynamically significant stenosis. --No retroperitoneal lymphadenopathy. --No mesenteric lymphadenopathy. --No pelvic or inguinal lymphadenopathy. Reproductive: Brachytherapy beads are noted Other: There is nonspecific retroperitoneal fluid along the right psoas muscle. There is a fat containing umbilical hernia. Musculoskeletal. No acute displaced fractures. IMPRESSION: 1. There are bilateral nondisplaced anterior rib fractures without evidence for pneumothorax. 2. There is a metallic foreign body in the gastric body measuring approximately 2.1 cm. This may represent the reported swallowed hearing aid. 3. There appears to be mild wall thickening and possible adjacent fat stranding involving the proximal duodenum. This may represent duodenitis/peptic ulcer disease versus pancreatitis. There is a small volume of retroperitoneal free fluid is felt to be related. 4. There is a moderate-sized right-sided pleural effusion and a small left-sided pleural effusion. 5. There is a 5 mm pulmonary nodule in the left upper lobe. No follow-up needed if patient is low-risk. Non-contrast chest CT can be considered in 12 months if patient is high-risk. This recommendation follows the consensus statement: Guidelines for Management of Incidental Pulmonary Nodules Detected on CT Images: From the Fleischner Society 2017; Radiology 2017; 284:228-243. 6. There are indeterminate bilateral renal nodules favored to represent hemorrhagic or proteinaceous cysts. Follow-up with a nonemergent outpatient renal ultrasound is recommended. 7. Cardiomegaly and coronary artery  calcifications. There are findings suggestive of volume overload and developing pulmonary edema. 8. Anemia. 9. Ascending thoracic aortic aneurysm measuring 4.2 cm. Recommend annual imaging followup by CTA or MRA. This recommendation follows 2010 ACCF/AHA/AATS/ACR/ASA/SCA/SCAI/SIR/STS/SVM Guidelines for the Diagnosis and Management of Patients with Thoracic Aortic Disease. Circulation. 2010; 121: L875-I433. Aortic aneurysm NOS (ICD10-I71.9) 10. No acute fracture involving the thoracic or lumbar spine. Aortic Atherosclerosis (ICD10-I70.0). Electronically Signed   By: Constance Holster M.D.   On: 12/21/2020 02:31   CT L-SPINE NO CHARGE  Result Date: 12/21/2020 CLINICAL DATA:  Acute pain due to trauma. Bilateral shoulder pain. Possible swallowed foreign body. EXAM: CT CHEST, ABDOMEN AND PELVIS WITHOUT CONTRAST CT THORACIC AND LUMBAR SPINE WITHOUT CONTRAST TECHNIQUE: Multidetector CT imaging of the chest, abdomen and pelvis was performed following the standard protocol without IV contrast. Multiplanar CT images of the thoracic and lumbar spine were reconstructed from contemporary CT of the Chest, Abdomen, and Pelvis COMPARISON:  None. FINDINGS: CT CHEST FINDINGS Cardiovascular: Atherosclerotic changes are noted of the thoracic aorta. There is an ascending thoracic aortic aneurysm measuring approximately 4.2 cm. The heart size is enlarged. Coronary artery calcifications are noted. There is a trace pericardial effusion. The intracardiac blood pool is hypodense relative to the adjacent myocardium consistent with anemia. Mediastinum/Nodes: -- No mediastinal lymphadenopathy. -- No hilar lymphadenopathy. -- No axillary lymphadenopathy. -- No supraclavicular lymphadenopathy. -- Normal thyroid gland where visualized. -  Unremarkable esophagus. Lungs/Pleura: There is a moderate-sized right-sided pleural effusion. There is a small left-sided pleural effusion. There are prominent interstitial lung markings bilaterally. No  pneumothorax. There are few scattered ground-glass airspace opacities bilaterally. There is a 5 mm pulmonary nodule in the left upper lobe (axial series 6, image 58). Musculoskeletal: There are acute to subacute appearing anterior rib fractures on the right involving  the third and fourth ribs. There appears to be a nondisplaced acute fracture involving the anterior second rib on the left. There are old healed anterior left-sided rib fractures. There are healing fractures involving the left seventh eighth ribs anteriorly. CT ABDOMEN PELVIS FINDINGS Hepatobiliary: Hepatic cysts are noted. Normal gallbladder.There is no biliary ductal dilation. Pancreas: Normal contours without ductal dilatation. No peripancreatic fluid collection. Spleen: Unremarkable. Adrenals/Urinary Tract: --Adrenal glands: Unremarkable. --Right kidney/ureter: Multiple complex and simple appearing cysts are noted involving the right kidney. --Left kidney/ureter: Multiple complex and simple appearing cysts are noted on the patient's left. There is an indeterminate exophytic hyperdense nodule rising from the interpolar region of the left kidney measuring approximately 2.5 cm. This nodule contains multiple calcifications (axial series 3, image 72). --Urinary bladder: Unremarkable. Stomach/Bowel: --Stomach/Duodenum: There is a metallic foreign body in the gastric body measuring approximately 2.1 cm (axial series 3, image 68). --Small bowel: There appears to be mild wall thickening and possible adjacent fat stranding involving the proximal duodenum. --Colon: Rectosigmoid diverticulosis without acute inflammation. --Appendix: Normal. Vascular/Lymphatic: Atherosclerotic calcification is present within the non-aneurysmal abdominal aorta, without hemodynamically significant stenosis. --No retroperitoneal lymphadenopathy. --No mesenteric lymphadenopathy. --No pelvic or inguinal lymphadenopathy. Reproductive: Brachytherapy beads are noted Other: There is  nonspecific retroperitoneal fluid along the right psoas muscle. There is a fat containing umbilical hernia. Musculoskeletal. No acute displaced fractures. IMPRESSION: 1. There are bilateral nondisplaced anterior rib fractures without evidence for pneumothorax. 2. There is a metallic foreign body in the gastric body measuring approximately 2.1 cm. This may represent the reported swallowed hearing aid. 3. There appears to be mild wall thickening and possible adjacent fat stranding involving the proximal duodenum. This may represent duodenitis/peptic ulcer disease versus pancreatitis. There is a small volume of retroperitoneal free fluid is felt to be related. 4. There is a moderate-sized right-sided pleural effusion and a small left-sided pleural effusion. 5. There is a 5 mm pulmonary nodule in the left upper lobe. No follow-up needed if patient is low-risk. Non-contrast chest CT can be considered in 12 months if patient is high-risk. This recommendation follows the consensus statement: Guidelines for Management of Incidental Pulmonary Nodules Detected on CT Images: From the Fleischner Society 2017; Radiology 2017; 284:228-243. 6. There are indeterminate bilateral renal nodules favored to represent hemorrhagic or proteinaceous cysts. Follow-up with a nonemergent outpatient renal ultrasound is recommended. 7. Cardiomegaly and coronary artery calcifications. There are findings suggestive of volume overload and developing pulmonary edema. 8. Anemia. 9. Ascending thoracic aortic aneurysm measuring 4.2 cm. Recommend annual imaging followup by CTA or MRA. This recommendation follows 2010 ACCF/AHA/AATS/ACR/ASA/SCA/SCAI/SIR/STS/SVM Guidelines for the Diagnosis and Management of Patients with Thoracic Aortic Disease. Circulation. 2010; 121: I458-K998. Aortic aneurysm NOS (ICD10-I71.9) 10. No acute fracture involving the thoracic or lumbar spine. Aortic Atherosclerosis (ICD10-I70.0). Electronically Signed   By: Constance Holster M.D.   On: 12/21/2020 02:31   CT CHEST ABDOMEN PELVIS WO CONTRAST  Result Date: 12/21/2020 CLINICAL DATA:  Acute pain due to trauma. Bilateral shoulder pain. Possible swallowed foreign body. EXAM: CT CHEST, ABDOMEN AND PELVIS WITHOUT CONTRAST CT THORACIC AND LUMBAR SPINE WITHOUT CONTRAST TECHNIQUE: Multidetector CT imaging of the chest, abdomen and pelvis was performed following the standard protocol without IV contrast. Multiplanar CT images of the thoracic and lumbar spine were reconstructed from contemporary CT of the Chest, Abdomen, and Pelvis COMPARISON:  None. FINDINGS: CT CHEST FINDINGS Cardiovascular: Atherosclerotic changes are noted of the thoracic aorta. There is an ascending thoracic aortic aneurysm measuring approximately 4.2  cm. The heart size is enlarged. Coronary artery calcifications are noted. There is a trace pericardial effusion. The intracardiac blood pool is hypodense relative to the adjacent myocardium consistent with anemia. Mediastinum/Nodes: -- No mediastinal lymphadenopathy. -- No hilar lymphadenopathy. -- No axillary lymphadenopathy. -- No supraclavicular lymphadenopathy. -- Normal thyroid gland where visualized. -  Unremarkable esophagus. Lungs/Pleura: There is a moderate-sized right-sided pleural effusion. There is a small left-sided pleural effusion. There are prominent interstitial lung markings bilaterally. No pneumothorax. There are few scattered ground-glass airspace opacities bilaterally. There is a 5 mm pulmonary nodule in the left upper lobe (axial series 6, image 58). Musculoskeletal: There are acute to subacute appearing anterior rib fractures on the right involving the third and fourth ribs. There appears to be a nondisplaced acute fracture involving the anterior second rib on the left. There are old healed anterior left-sided rib fractures. There are healing fractures involving the left seventh eighth ribs anteriorly. CT ABDOMEN PELVIS FINDINGS Hepatobiliary:  Hepatic cysts are noted. Normal gallbladder.There is no biliary ductal dilation. Pancreas: Normal contours without ductal dilatation. No peripancreatic fluid collection. Spleen: Unremarkable. Adrenals/Urinary Tract: --Adrenal glands: Unremarkable. --Right kidney/ureter: Multiple complex and simple appearing cysts are noted involving the right kidney. --Left kidney/ureter: Multiple complex and simple appearing cysts are noted on the patient's left. There is an indeterminate exophytic hyperdense nodule rising from the interpolar region of the left kidney measuring approximately 2.5 cm. This nodule contains multiple calcifications (axial series 3, image 72). --Urinary bladder: Unremarkable. Stomach/Bowel: --Stomach/Duodenum: There is a metallic foreign body in the gastric body measuring approximately 2.1 cm (axial series 3, image 68). --Small bowel: There appears to be mild wall thickening and possible adjacent fat stranding involving the proximal duodenum. --Colon: Rectosigmoid diverticulosis without acute inflammation. --Appendix: Normal. Vascular/Lymphatic: Atherosclerotic calcification is present within the non-aneurysmal abdominal aorta, without hemodynamically significant stenosis. --No retroperitoneal lymphadenopathy. --No mesenteric lymphadenopathy. --No pelvic or inguinal lymphadenopathy. Reproductive: Brachytherapy beads are noted Other: There is nonspecific retroperitoneal fluid along the right psoas muscle. There is a fat containing umbilical hernia. Musculoskeletal. No acute displaced fractures. IMPRESSION: 1. There are bilateral nondisplaced anterior rib fractures without evidence for pneumothorax. 2. There is a metallic foreign body in the gastric body measuring approximately 2.1 cm. This may represent the reported swallowed hearing aid. 3. There appears to be mild wall thickening and possible adjacent fat stranding involving the proximal duodenum. This may represent duodenitis/peptic ulcer disease  versus pancreatitis. There is a small volume of retroperitoneal free fluid is felt to be related. 4. There is a moderate-sized right-sided pleural effusion and a small left-sided pleural effusion. 5. There is a 5 mm pulmonary nodule in the left upper lobe. No follow-up needed if patient is low-risk. Non-contrast chest CT can be considered in 12 months if patient is high-risk. This recommendation follows the consensus statement: Guidelines for Management of Incidental Pulmonary Nodules Detected on CT Images: From the Fleischner Society 2017; Radiology 2017; 284:228-243. 6. There are indeterminate bilateral renal nodules favored to represent hemorrhagic or proteinaceous cysts. Follow-up with a nonemergent outpatient renal ultrasound is recommended. 7. Cardiomegaly and coronary artery calcifications. There are findings suggestive of volume overload and developing pulmonary edema. 8. Anemia. 9. Ascending thoracic aortic aneurysm measuring 4.2 cm. Recommend annual imaging followup by CTA or MRA. This recommendation follows 2010 ACCF/AHA/AATS/ACR/ASA/SCA/SCAI/SIR/STS/SVM Guidelines for the Diagnosis and Management of Patients with Thoracic Aortic Disease. Circulation. 2010; 121: Z610-R604. Aortic aneurysm NOS (ICD10-I71.9) 10. No acute fracture involving the thoracic or lumbar spine. Aortic Atherosclerosis (ICD10-I70.0).  Electronically Signed   By: Constance Holster M.D.   On: 12/21/2020 02:31    Impression:  1.  Anemia.  No active GI bleeding.  Hemoccult negative. 2.  Fall with facial and chest trauma, possibly from #1 above. 3.  Rib fractures, from #2 above. 4.  Heart failure suggested on CT scan. 5.  Thoracic aneurysm. 6.  Acute renal failure.  Elevated CK; component of rhabdomyolysis? 7.  Profound hyponatremia. 8.  Inadvertent ingestion of hearing aid ~ 48 hours ago. 9.  Duodenitis, possible peptic ulcer, based on CT scan. 10.  Chronic anticoagulation, apixaban.  Plan:  1.  Medical management of  acute renal failure and hyponatremia. 2.  Supportive care for bony fractures/trauma. 3.  Patient is in no state for endoscopy at this time, and has very high anesthesia-related risks given his profound hyponatremia.  4.  There is a chance that the hearing aid will advance into the more distal bowel while we wait, but I suspect this is not likely as the hearing aid has remained in stomach ~ 48 hours after ingestion.  While typically we would attempt endoscopy today, overall the risks of procedure at this time (anesthesia risks especially, given hyponatremia, and suspected rhabdomyolysis with acute renal failure) far outweigh the benefits. 5.  Would ideally hold Eliquis in anticipation of need for endoscopy in the next 1-2 days. 6.  [facility] will follow; hopefully we can do the endoscopy within the next couple days.   LOS: 0 days   Kimmy Totten M  12/21/2020, 1:19 PM  Cell [phone] If no answer or after 5 PM call [phone]

## 2020-12-21 NOTE — ED Notes (Signed)
Attempted to call report, RN receiving new pt at this time, will call back for report.

## 2020-12-21 NOTE — ED Provider Notes (Signed)
[facility] EMERGENCY DEPARTMENT Provider Note   CSN: [id number] Arrival date & time: 12/20/20  2320     History Chief Complaint  Patient presents with  . Fall    Taking Eliquis    [name] [name] is a 87 y.o. male.  Patient is an 86 year old male with past medical history of coronary artery disease with stent in 2013, hypertension, prostate cancer.  Patient presents today for evaluation of fall.  Patient apparently fell in the shower this morning and struck the back of his head and upper back.  He is complaining of severe pain radiating between his shoulder blades down his back into his lower back.  The pain comes and goes in waves.  He denies any weakness or numbness.  He denies loss of consciousness, but did strike his head and is on Eliquis.  Normally resides in [place] and was brought here to be closer to family due to worsening of his health.  The history is provided by the patient.       Past Medical History:  Diagnosis Date  . Acid reflux   . Angina   . CAD (coronary artery disease), stent placed 11/26/11  11/27/2011  . Heart murmur   . Hypertension   . NSTEMI (non-ST elevated myocardial infarction) most likely as outpatient 11/24/2011  . Prostate cancer ([place])   . S/P angioplasty with stent, 11/26/11 distal AVGroove LCX into OM3 with BMS ([facility]) and rescue PTCA on OM2 jailed by stent. 11/27/2011    Patient Active Problem List   Diagnosis Date Noted  . CAD (coronary artery disease), stent placed 11/26/11  11/27/2011  . S/P angioplasty with stent, 11/26/11 distal AVGroove LCX into OM3 with BMS ([facility]ity) and rescue PTCA on OM2 jailed by stent. 11/27/2011  . NSTEMI (non-ST elevated myocardial infarction) most likely as outpatient 11/24/2011  . CKD (chronic kidney disease) stage 3, GFR 30-59 ml/min (HCC) 11/24/2011  . Chest pain 11/23/2011  . HTN (hypertension) 11/23/2011  . A-fib, New onset 11/23/2011  . Acid reflux     Past Surgical History:   Procedure Laterality Date  . CATARACT EXTRACTION W/ INTRAOCULAR LENS  IMPLANT, BILATERAL  ~ 2007  . INSERTION PROSTATE RADIATION SEED  2001  . LEFT HEART CATHETERIZATION WITH CORONARY ANGIOGRAM N/A 11/26/2011   Procedure: LEFT HEART CATHETERIZATION WITH CORONARY ANGIOGRAM;  Surgeon: Sanda Klein, MD;  Location: [facility] CATH [facility];  Service: Cardiovascular;  Laterality: N/A;  . STAPEDECTOMY  1960; 1980   left; right       No family history on file.  Social History   Tobacco Use  . Smoking status: Former Smoker    Packs/day: 0.50    Years: 14.00    Pack years: 7.00    Types: Cigars, Cigarettes    Quit date: 09/17/1978    Years since quitting: 42.2  . Smokeless tobacco: Never Used  Substance Use Topics  . Alcohol use: Yes    Comment: 11/23/11 "haven't drank anything  since ~ 2008"  . Drug use: No    Home Medications Prior to Admission medications   Medication Sig Start Date End Date Taking? Authorizing Provider  aspirin EC 81 MG tablet Take 81 mg by mouth daily.    [provider]  isosorbide mononitrate (IMDUR) 15 mg TB24 Take 0.5 tablets (15 mg total) by mouth daily. 11/28/11   Brett Canales, PA-C  omeprazole (PRILOSEC) 20 MG capsule Take 20 mg by mouth daily.    [provider]  rivaroxaban 20 MG  TABS Take 20 mg by mouth daily at 12 noon. 11/28/11   Brett Canales, PA-C    Allergies    Patient has no known allergies.  Review of Systems   Review of Systems  All other systems reviewed and are negative.   Physical Exam Updated Vital Signs BP 116/77   Pulse 68   Temp (!) 96.8 F (36 C) (Temporal)   Resp (!) 22   Ht 6' (1.829 m)   Wt 115 kg   SpO2 95%   BMI 34.38 kg/m   Physical Exam Vitals and nursing note reviewed.  Constitutional:      General: He is not in acute distress.    Appearance: He is well-developed. He is not diaphoretic.  HENT:     Head: Normocephalic and atraumatic.  Cardiovascular:     Rate and Rhythm: Normal rate and regular  rhythm.     Heart sounds: No murmur heard. No friction rub.  Pulmonary:     Effort: Pulmonary effort is normal. No respiratory distress.     Breath sounds: Normal breath sounds. No wheezing or rales.  Abdominal:     General: Bowel sounds are normal. There is no distension.     Palpations: Abdomen is soft.     Tenderness: There is no abdominal tenderness.  Musculoskeletal:        General: Normal range of motion.     Cervical back: Normal range of motion and neck supple.     Comments: Patient describes severe pain with palpation to the soft tissues between his shoulder blades.  There is no step-off or palpable abnormality.  Skin:    General: Skin is warm and dry.  Neurological:     Mental Status: He is alert and oriented to person, place, and time.     Cranial Nerves: No cranial nerve deficit.     Sensory: No sensory deficit.     Motor: No weakness.     Coordination: Coordination normal.     Comments: Motor and sensation is intact to all 4 extremities.  Radial pulses are palpable and equal bilaterally as are the DP pulses in both feet.     ED Results / Procedures / Treatments   Labs (all labs ordered are listed, but only abnormal results are displayed) Labs Reviewed  COMPREHENSIVE METABOLIC PANEL - Abnormal; Notable for the following components:      Result Value   Sodium 122 (*)    Chloride 92 (*)    CO2 20 (*)    Glucose, Bld 118 (*)    BUN 80 (*)    Creatinine, Ser 4.45 (*)    Calcium 10.8 (*)    Total Protein 5.8 (*)    Albumin 3.2 (*)    AST 63 (*)    Total Bilirubin 1.3 (*)    GFR, Estimated 12 (*)    All other components within normal limits  CBC WITH DIFFERENTIAL/PLATELET - Abnormal; Notable for the following components:   RBC 2.80 (*)    Hemoglobin 8.7 (*)    HCT 25.5 (*)    Platelets 140 (*)    Neutro Abs 8.3 (*)    Abs Immature Granulocytes 0.09 (*)    All other components within normal limits  TROPONIN I (HIGH SENSITIVITY)    EKG EKG  Interpretation  Date/Time:  Tuesday December 20 2020 23:30:13 EDT Ventricular Rate:  70 PR Interval:    QRS Duration: 163 QT Interval:  486 QTC Calculation: 525 R Axis:  34 Text Interpretation: Atrial fibrillation Ventricular premature complex IVCD, consider atypical RBBB Confirmed by Veryl Speak [phone]) on 12/20/2020 11:37:36 PM   Radiology No results found.  Procedures Procedures   Medications Ordered in ED Medications  morphine 4 MG/ML injection 4 mg (4 mg Intravenous Given 12/21/20 0013)  ondansetron (ZOFRAN) injection 4 mg (4 mg Intravenous Given 12/21/20 0013)    ED Course  I have reviewed the triage vital signs and the nursing notes.  Pertinent labs & imaging results that were available during my care of the patient were reviewed by me and considered in my medical decision making (see chart for details).    MDM Rules/Calculators/A&P  The patient is an 87 year old male with past medical history as per HPI presenting after a fall.  Patient takes Eliquis and landed on his back/neck/back of his head when falling in the shower.  Patient complaining of severe pain between his shoulder blades and ribs.  CT scans of the head, cervical spine, chest, abdomen, and pelvis were obtained showing several rib fractures, but no other acute abnormality.  Laboratory studies reveal a sodium of 122 along with worsening of his renal function.  Patient appears somewhat dry.  He will be hydrated and admitted to the hospitalist service for pain control and correction of his sodium/renal function.  CRITICAL CARE Performed by: Veryl Speak Total critical care time: 45 minutes Critical care time was exclusive of separately billable procedures and treating other patients. Critical care was necessary to treat or prevent imminent or life-threatening deterioration. Critical care was time spent personally by me on the following activities: development of treatment plan with patient and/or surrogate as well  as nursing, discussions with consultants, evaluation of patient's response to treatment, examination of patient, obtaining history from patient or surrogate, ordering and performing treatments and interventions, ordering and review of laboratory studies, ordering and review of radiographic studies, pulse oximetry and re-evaluation of patient's condition.   Final Clinical Impression(s) / ED Diagnoses Final diagnoses:  Trauma  Pain    Rx / DC Orders ED Discharge Orders    None       Veryl Speak, MD 12/21/20 [phone]

## 2020-12-21 NOTE — Plan of Care (Signed)
  Problem: Safety: Goal: Ability to remain free from injury will improve Outcome: Progressing   

## 2020-12-22 ENCOUNTER — Inpatient Hospital Stay (HOSPITAL_COMMUNITY): Payer: Medicare Other

## 2020-12-22 DIAGNOSIS — S2243XA Multiple fractures of ribs, bilateral, initial encounter for closed fracture: Secondary | ICD-10-CM | POA: Diagnosis not present

## 2020-12-22 DIAGNOSIS — I5021 Acute systolic (congestive) heart failure: Secondary | ICD-10-CM | POA: Diagnosis not present

## 2020-12-22 DIAGNOSIS — I509 Heart failure, unspecified: Secondary | ICD-10-CM | POA: Diagnosis not present

## 2020-12-22 DIAGNOSIS — N179 Acute kidney failure, unspecified: Secondary | ICD-10-CM | POA: Diagnosis not present

## 2020-12-22 DIAGNOSIS — I4819 Other persistent atrial fibrillation: Secondary | ICD-10-CM | POA: Diagnosis not present

## 2020-12-22 DIAGNOSIS — E871 Hypo-osmolality and hyponatremia: Secondary | ICD-10-CM | POA: Diagnosis not present

## 2020-12-22 LAB — BASIC METABOLIC PANEL
Anion gap: 12 (ref 5–15)
BUN: 80 mg/dL — ABNORMAL HIGH (ref 8–23)
CO2: 20 mmol/L — ABNORMAL LOW (ref 22–32)
Calcium: 9.9 mg/dL (ref 8.9–10.3)
Chloride: 94 mmol/L — ABNORMAL LOW (ref 98–111)
Creatinine, Ser: 4.43 mg/dL — ABNORMAL HIGH (ref 0.61–1.24)
GFR, Estimated: 12 mL/min — ABNORMAL LOW (ref >60)
Glucose, Bld: 96 mg/dL (ref 70–99)
Potassium: 4.1 mmol/L (ref 3.5–5.1)
Sodium: 126 mmol/L — ABNORMAL LOW (ref 135–145)

## 2020-12-22 LAB — ECHOCARDIOGRAM COMPLETE
Area-P 1/2: 4.68 cm2
Calc EF: 44.3 %
Height: 72 in
S' Lateral: 3.5 cm
Single Plane A2C EF: 42.8 %
Single Plane A4C EF: 44 %
Weight: 4056.46 oz

## 2020-12-22 LAB — CBC
HCT: 27.1 % — ABNORMAL LOW (ref 39.0–52.0)
Hemoglobin: 9.2 g/dL — ABNORMAL LOW (ref 13.0–17.0)
MCH: 30.6 pg (ref 26.0–34.0)
MCHC: 33.9 g/dL (ref 30.0–36.0)
MCV: 90 fL (ref 80.0–100.0)
Platelets: 141 10*3/uL — ABNORMAL LOW (ref 150–400)
RBC: 3.01 MIL/uL — ABNORMAL LOW (ref 4.22–5.81)
RDW: 15.1 % (ref 11.5–15.5)
WBC: 12.4 10*3/uL — ABNORMAL HIGH (ref 4.0–10.5)
nRBC: 0 % (ref 0.0–0.2)

## 2020-12-22 IMAGING — CR DG ABDOMEN 1V
1 series · 1 of 1 positions shown · non-contrast
Comparison: None.

CLINICAL DATA: Rule out foreign body. May have swallowed hearing
aid

EXAM:
ABDOMEN - 1 VIEW

[abdomen kub]
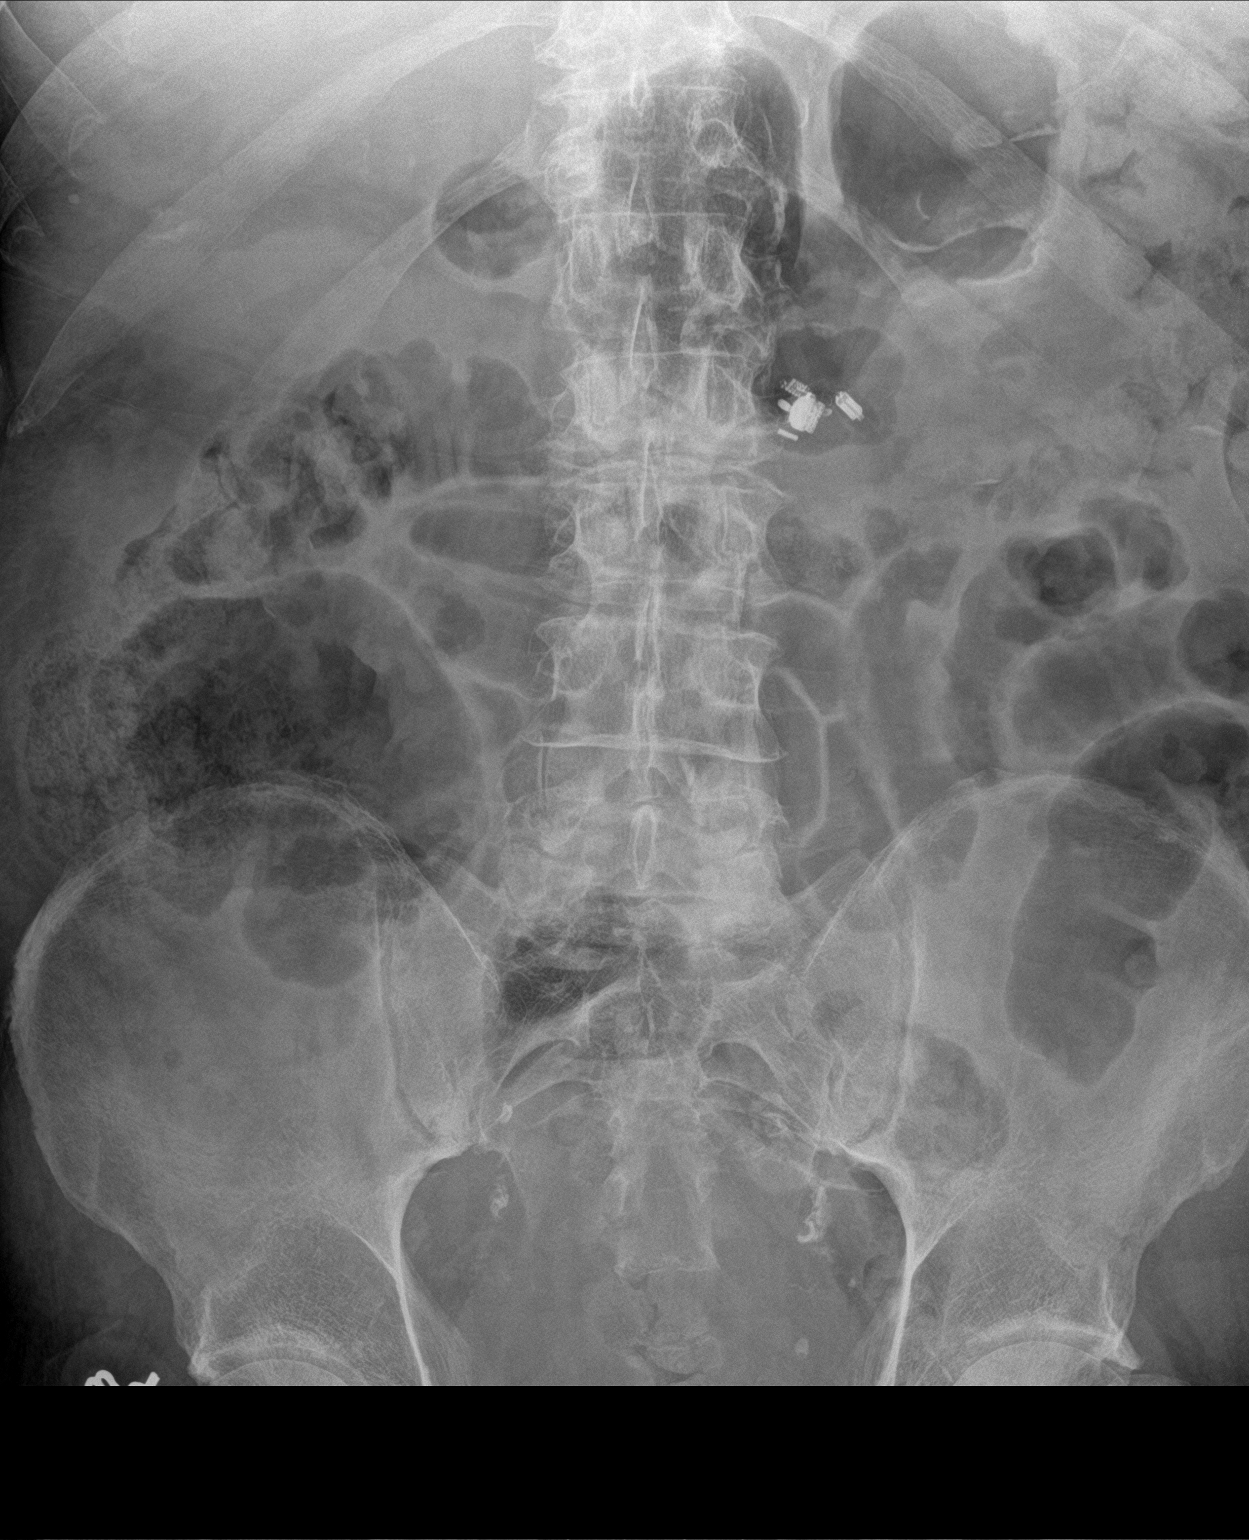

[1 of 1 positions shown; findings below may reference images not displayed]

FINDINGS: Electronic device to the left of L2. This appears to be within bowel
and could be in the transverse colon, less likely the stomach.

Nonobstructive bowel gas pattern. Mild degenerative change lumbar
spine.
IMPRESSION: Electronic device to the left of L2 compatible with hearing aid,
likely in the transverse colon.

## 2020-12-22 MED ORDER — ALLOPURINOL 100 MG PO TABS
100.0000 mg | ORAL_TABLET | Freq: Every day | ORAL | Status: DC
  Administered 2020-12-22 – 2021-01-04 (×14): 100 mg via ORAL
  Filled 2020-12-22 (×14): qty 1

## 2020-12-22 MED ORDER — ASPIRIN EC 81 MG PO TBEC
81.0000 mg | DELAYED_RELEASE_TABLET | Freq: Every day | ORAL | Status: DC
  Administered 2020-12-22 – 2021-01-04 (×14): 81 mg via ORAL
  Filled 2020-12-22 (×14): qty 1

## 2020-12-22 MED ORDER — LIDOCAINE 5 % EX PTCH
1.0000 | MEDICATED_PATCH | CUTANEOUS | Status: DC
  Administered 2020-12-22 – 2021-01-04 (×14): 1 via TRANSDERMAL
  Filled 2020-12-22 (×14): qty 1

## 2020-12-22 NOTE — Progress Notes (Signed)
[facility] Progress Note    Assessment/ Plan:   1.  Presumed AKI on CKD 3:  Unknown baseline Cr- getting records--> these were requested again today.  CT scan shows bilateral complex renal cysts but no obstruction.  No hypotensive episodes unless falls were vagal/ syncopal events.  UA unremarkable at present.  Priority will be getting volume off and improving Na.  Would hold cozaar. Continue IV Lasix. No indication for dialysis at present.  Cr is stable  2.  Hyponatremia: appears to be hypervolemic hyponatremia in the setting of too much fluid.  Getting Lasix IV 40 BID. Improving, continue IV Lasix  3.  Acute on chronic CHF exacerbation: cardiology on board- appreciate assistance  4.  Acute encephalopathy/ falls- may be precipitated by the hyponatremia.  Mental status is better today  5. Rib fractures/ facial trauma- supportive care, please avoid morphine in CKD pt  6.  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  Afib: Eliquis being held for possible endoscopy  Subjective:    Seen in room.  Got morphine and was delirious overnight, saw "aliens".  Na is better, up to 126.  Records still pending- gave the # of the 5 MW fax to pt's son so he can request them again.     Objective:   BP 121/69 (BP Location: Right Arm)   Pulse 61   Temp 98.3 F (36.8 C)   Resp 18   Ht 6' (1.829 m)   Wt 115 kg   SpO2 99%   BMI 34.38 kg/m   Intake/Output Summary (Last 24 hours) at 12/22/2020 1158 Last data filed at 12/22/2020 0956 Gross per 24 hour  Intake 300 ml  Output 1250 ml  Net -950 ml   Weight change:   Physical Exam: GEN NAD, lying in bed HEENT EOMI PERRL, tacky MM NECK + JVD, about the same PULM muffled bibasilar breath sounds CV irregular ABD soft EXT 1+ LE edema NEURO more awake than yesterday SKIN tanned  Imaging: DG Abd 1 View  Result Date: 12/22/2020 CLINICAL DATA:  Rule out foreign body. May have swallowed hearing aid  EXAM: ABDOMEN - 1 VIEW COMPARISON:  None. FINDINGS: Electronic device to the left of L2. This appears to be within bowel and could be in the transverse colon, less likely the stomach. Nonobstructive bowel gas pattern. Mild degenerative change lumbar spine. IMPRESSION: Electronic device to the left of L2 compatible with hearing aid, likely in the transverse colon. Electronically Signed   By: Franchot Gallo M.D.   On: 12/22/2020 11:34   CT Head Wo Contrast  Result Date: 12/21/2020 CLINICAL DATA:  Fall EXAM: CT HEAD WITHOUT CONTRAST TECHNIQUE: Contiguous axial images were obtained from the base of the skull through the vertex without intravenous contrast. COMPARISON:  None. FINDINGS: Brain: There is atrophy and chronic small vessel disease changes. No acute intracranial abnormality. Specifically, no hemorrhage, hydrocephalus, mass lesion, acute infarction, or significant intracranial injury. Vascular: No hyperdense vessel or unexpected calcification. Skull: No acute calvarial abnormality. Sinuses/Orbits: No acute findings Other: None IMPRESSION: Atrophy, chronic microvascular disease. No acute intracranial abnormality. Electronically Signed   By: Rolm Baptise M.D.   On: 12/21/2020 01:47   CT Cervical Spine Wo Contrast  Result Date: 12/21/2020 CLINICAL DATA:  Fall EXAM: CT CERVICAL SPINE WITHOUT CONTRAST TECHNIQUE: Multidetector CT imaging of the cervical spine was performed without intravenous contrast. Multiplanar CT image reconstructions were also generated. COMPARISON:  None. FINDINGS: Alignment: Slight anterolisthesis of C4  on C5 related to facet disease. Skull base and vertebrae: No acute fracture. No primary bone lesion or focal pathologic process. Soft tissues and spinal canal: No prevertebral fluid or swelling. No visible canal hematoma. Disc levels: Diffuse degenerative disc disease, most pronounced in the lower lumbar spine. Diffuse degenerative facet disease bilaterally. Upper chest: Right pleural  effusion partially imaged. Other: None IMPRESSION: Degenerative changes.  No acute bony abnormality. Right pleural effusion. Electronically Signed   By: Rolm Baptise M.D.   On: 12/21/2020 01:49   CT T-SPINE NO CHARGE  Result Date: 12/21/2020 CLINICAL DATA:  Acute pain due to trauma. Bilateral shoulder pain. Possible swallowed foreign body. EXAM: CT CHEST, ABDOMEN AND PELVIS WITHOUT CONTRAST CT THORACIC AND LUMBAR SPINE WITHOUT CONTRAST TECHNIQUE: Multidetector CT imaging of the chest, abdomen and pelvis was performed following the standard protocol without IV contrast. Multiplanar CT images of the thoracic and lumbar spine were reconstructed from contemporary CT of the Chest, Abdomen, and Pelvis COMPARISON:  None. FINDINGS: CT CHEST FINDINGS Cardiovascular: Atherosclerotic changes are noted of the thoracic aorta. There is an ascending thoracic aortic aneurysm measuring approximately 4.2 cm. The heart size is enlarged. Coronary artery calcifications are noted. There is a trace pericardial effusion. The intracardiac blood pool is hypodense relative to the adjacent myocardium consistent with anemia. Mediastinum/Nodes: -- No mediastinal lymphadenopathy. -- No hilar lymphadenopathy. -- No axillary lymphadenopathy. -- No supraclavicular lymphadenopathy. -- Normal thyroid gland where visualized. -  Unremarkable esophagus. Lungs/Pleura: There is a moderate-sized right-sided pleural effusion. There is a small left-sided pleural effusion. There are prominent interstitial lung markings bilaterally. No pneumothorax. There are few scattered ground-glass airspace opacities bilaterally. There is a 5 mm pulmonary nodule in the left upper lobe (axial series 6, image 58). Musculoskeletal: There are acute to subacute appearing anterior rib fractures on the right involving the third and fourth ribs. There appears to be a nondisplaced acute fracture involving the anterior second rib on the left. There are old healed anterior  left-sided rib fractures. There are healing fractures involving the left seventh eighth ribs anteriorly. CT ABDOMEN PELVIS FINDINGS Hepatobiliary: Hepatic cysts are noted. Normal gallbladder.There is no biliary ductal dilation. Pancreas: Normal contours without ductal dilatation. No peripancreatic fluid collection. Spleen: Unremarkable. Adrenals/Urinary Tract: --Adrenal glands: Unremarkable. --Right kidney/ureter: Multiple complex and simple appearing cysts are noted involving the right kidney. --Left kidney/ureter: Multiple complex and simple appearing cysts are noted on the patient's left. There is an indeterminate exophytic hyperdense nodule rising from the interpolar region of the left kidney measuring approximately 2.5 cm. This nodule contains multiple calcifications (axial series 3, image 72). --Urinary bladder: Unremarkable. Stomach/Bowel: --Stomach/Duodenum: There is a metallic foreign body in the gastric body measuring approximately 2.1 cm (axial series 3, image 68). --Small bowel: There appears to be mild wall thickening and possible adjacent fat stranding involving the proximal duodenum. --Colon: Rectosigmoid diverticulosis without acute inflammation. --Appendix: Normal. Vascular/Lymphatic: Atherosclerotic calcification is present within the non-aneurysmal abdominal aorta, without hemodynamically significant stenosis. --No retroperitoneal lymphadenopathy. --No mesenteric lymphadenopathy. --No pelvic or inguinal lymphadenopathy. Reproductive: Brachytherapy beads are noted Other: There is nonspecific retroperitoneal fluid along the right psoas muscle. There is a fat containing umbilical hernia. Musculoskeletal. No acute displaced fractures. IMPRESSION: 1. There are bilateral nondisplaced anterior rib fractures without evidence for pneumothorax. 2. There is a metallic foreign body in the gastric body measuring approximately 2.1 cm. This may represent the reported swallowed hearing aid. 3. There appears to be  mild wall thickening and possible adjacent fat stranding involving  the proximal duodenum. This may represent duodenitis/peptic ulcer disease versus pancreatitis. There is a small volume of retroperitoneal free fluid is felt to be related. 4. There is a moderate-sized right-sided pleural effusion and a small left-sided pleural effusion. 5. There is a 5 mm pulmonary nodule in the left upper lobe. No follow-up needed if patient is low-risk. Non-contrast chest CT can be considered in 12 months if patient is high-risk. This recommendation follows the consensus statement: Guidelines for Management of Incidental Pulmonary Nodules Detected on CT Images: From the Fleischner Society 2017; Radiology 2017; 284:228-243. 6. There are indeterminate bilateral renal nodules favored to represent hemorrhagic or proteinaceous cysts. Follow-up with a nonemergent outpatient renal ultrasound is recommended. 7. Cardiomegaly and coronary artery calcifications. There are findings suggestive of volume overload and developing pulmonary edema. 8. Anemia. 9. Ascending thoracic aortic aneurysm measuring 4.2 cm. Recommend annual imaging followup by CTA or MRA. This recommendation follows 2010 ACCF/AHA/AATS/ACR/ASA/SCA/SCAI/SIR/STS/SVM Guidelines for the Diagnosis and Management of Patients with Thoracic Aortic Disease. Circulation. 2010; 121: O671-I458. Aortic aneurysm NOS (ICD10-I71.9) 10. No acute fracture involving the thoracic or lumbar spine. Aortic Atherosclerosis (ICD10-I70.0). Electronically Signed   By: Constance Holster M.D.   On: 12/21/2020 02:31   CT L-SPINE NO CHARGE  Result Date: 12/21/2020 CLINICAL DATA:  Acute pain due to trauma. Bilateral shoulder pain. Possible swallowed foreign body. EXAM: CT CHEST, ABDOMEN AND PELVIS WITHOUT CONTRAST CT THORACIC AND LUMBAR SPINE WITHOUT CONTRAST TECHNIQUE: Multidetector CT imaging of the chest, abdomen and pelvis was performed following the standard protocol without IV contrast. Multiplanar  CT images of the thoracic and lumbar spine were reconstructed from contemporary CT of the Chest, Abdomen, and Pelvis COMPARISON:  None. FINDINGS: CT CHEST FINDINGS Cardiovascular: Atherosclerotic changes are noted of the thoracic aorta. There is an ascending thoracic aortic aneurysm measuring approximately 4.2 cm. The heart size is enlarged. Coronary artery calcifications are noted. There is a trace pericardial effusion. The intracardiac blood pool is hypodense relative to the adjacent myocardium consistent with anemia. Mediastinum/Nodes: -- No mediastinal lymphadenopathy. -- No hilar lymphadenopathy. -- No axillary lymphadenopathy. -- No supraclavicular lymphadenopathy. -- Normal thyroid gland where visualized. -  Unremarkable esophagus. Lungs/Pleura: There is a moderate-sized right-sided pleural effusion. There is a small left-sided pleural effusion. There are prominent interstitial lung markings bilaterally. No pneumothorax. There are few scattered ground-glass airspace opacities bilaterally. There is a 5 mm pulmonary nodule in the left upper lobe (axial series 6, image 58). Musculoskeletal: There are acute to subacute appearing anterior rib fractures on the right involving the third and fourth ribs. There appears to be a nondisplaced acute fracture involving the anterior second rib on the left. There are old healed anterior left-sided rib fractures. There are healing fractures involving the left seventh eighth ribs anteriorly. CT ABDOMEN PELVIS FINDINGS Hepatobiliary: Hepatic cysts are noted. Normal gallbladder.There is no biliary ductal dilation. Pancreas: Normal contours without ductal dilatation. No peripancreatic fluid collection. Spleen: Unremarkable. Adrenals/Urinary Tract: --Adrenal glands: Unremarkable. --Right kidney/ureter: Multiple complex and simple appearing cysts are noted involving the right kidney. --Left kidney/ureter: Multiple complex and simple appearing cysts are noted on the patient's left.  There is an indeterminate exophytic hyperdense nodule rising from the interpolar region of the left kidney measuring approximately 2.5 cm. This nodule contains multiple calcifications (axial series 3, image 72). --Urinary bladder: Unremarkable. Stomach/Bowel: --Stomach/Duodenum: There is a metallic foreign body in the gastric body measuring approximately 2.1 cm (axial series 3, image 68). --Small bowel: There appears to be mild wall thickening and  possible adjacent fat stranding involving the proximal duodenum. --Colon: Rectosigmoid diverticulosis without acute inflammation. --Appendix: Normal. Vascular/Lymphatic: Atherosclerotic calcification is present within the non-aneurysmal abdominal aorta, without hemodynamically significant stenosis. --No retroperitoneal lymphadenopathy. --No mesenteric lymphadenopathy. --No pelvic or inguinal lymphadenopathy. Reproductive: Brachytherapy beads are noted Other: There is nonspecific retroperitoneal fluid along the right psoas muscle. There is a fat containing umbilical hernia. Musculoskeletal. No acute displaced fractures. IMPRESSION: 1. There are bilateral nondisplaced anterior rib fractures without evidence for pneumothorax. 2. There is a metallic foreign body in the gastric body measuring approximately 2.1 cm. This may represent the reported swallowed hearing aid. 3. There appears to be mild wall thickening and possible adjacent fat stranding involving the proximal duodenum. This may represent duodenitis/peptic ulcer disease versus pancreatitis. There is a small volume of retroperitoneal free fluid is felt to be related. 4. There is a moderate-sized right-sided pleural effusion and a small left-sided pleural effusion. 5. There is a 5 mm pulmonary nodule in the left upper lobe. No follow-up needed if patient is low-risk. Non-contrast chest CT can be considered in 12 months if patient is high-risk. This recommendation follows the consensus statement: Guidelines for Management  of Incidental Pulmonary Nodules Detected on CT Images: From the Fleischner Society 2017; Radiology 2017; 284:228-243. 6. There are indeterminate bilateral renal nodules favored to represent hemorrhagic or proteinaceous cysts. Follow-up with a nonemergent outpatient renal ultrasound is recommended. 7. Cardiomegaly and coronary artery calcifications. There are findings suggestive of volume overload and developing pulmonary edema. 8. Anemia. 9. Ascending thoracic aortic aneurysm measuring 4.2 cm. Recommend annual imaging followup by CTA or MRA. This recommendation follows 2010 ACCF/AHA/AATS/ACR/ASA/SCA/SCAI/SIR/STS/SVM Guidelines for the Diagnosis and Management of Patients with Thoracic Aortic Disease. Circulation. 2010; 121: H419-F790. Aortic aneurysm NOS (ICD10-I71.9) 10. No acute fracture involving the thoracic or lumbar spine. Aortic Atherosclerosis (ICD10-I70.0). Electronically Signed   By: Constance Holster M.D.   On: 12/21/2020 02:31   CT CHEST ABDOMEN PELVIS WO CONTRAST  Result Date: 12/21/2020 CLINICAL DATA:  Acute pain due to trauma. Bilateral shoulder pain. Possible swallowed foreign body. EXAM: CT CHEST, ABDOMEN AND PELVIS WITHOUT CONTRAST CT THORACIC AND LUMBAR SPINE WITHOUT CONTRAST TECHNIQUE: Multidetector CT imaging of the chest, abdomen and pelvis was performed following the standard protocol without IV contrast. Multiplanar CT images of the thoracic and lumbar spine were reconstructed from contemporary CT of the Chest, Abdomen, and Pelvis COMPARISON:  None. FINDINGS: CT CHEST FINDINGS Cardiovascular: Atherosclerotic changes are noted of the thoracic aorta. There is an ascending thoracic aortic aneurysm measuring approximately 4.2 cm. The heart size is enlarged. Coronary artery calcifications are noted. There is a trace pericardial effusion. The intracardiac blood pool is hypodense relative to the adjacent myocardium consistent with anemia. Mediastinum/Nodes: -- No mediastinal lymphadenopathy. --  No hilar lymphadenopathy. -- No axillary lymphadenopathy. -- No supraclavicular lymphadenopathy. -- Normal thyroid gland where visualized. -  Unremarkable esophagus. Lungs/Pleura: There is a moderate-sized right-sided pleural effusion. There is a small left-sided pleural effusion. There are prominent interstitial lung markings bilaterally. No pneumothorax. There are few scattered ground-glass airspace opacities bilaterally. There is a 5 mm pulmonary nodule in the left upper lobe (axial series 6, image 58). Musculoskeletal: There are acute to subacute appearing anterior rib fractures on the right involving the third and fourth ribs. There appears to be a nondisplaced acute fracture involving the anterior second rib on the left. There are old healed anterior left-sided rib fractures. There are healing fractures involving the left seventh eighth ribs anteriorly. CT ABDOMEN PELVIS FINDINGS  Hepatobiliary: Hepatic cysts are noted. Normal gallbladder.There is no biliary ductal dilation. Pancreas: Normal contours without ductal dilatation. No peripancreatic fluid collection. Spleen: Unremarkable. Adrenals/Urinary Tract: --Adrenal glands: Unremarkable. --Right kidney/ureter: Multiple complex and simple appearing cysts are noted involving the right kidney. --Left kidney/ureter: Multiple complex and simple appearing cysts are noted on the patient's left. There is an indeterminate exophytic hyperdense nodule rising from the interpolar region of the left kidney measuring approximately 2.5 cm. This nodule contains multiple calcifications (axial series 3, image 72). --Urinary bladder: Unremarkable. Stomach/Bowel: --Stomach/Duodenum: There is a metallic foreign body in the gastric body measuring approximately 2.1 cm (axial series 3, image 68). --Small bowel: There appears to be mild wall thickening and possible adjacent fat stranding involving the proximal duodenum. --Colon: Rectosigmoid diverticulosis without acute inflammation.  --Appendix: Normal. Vascular/Lymphatic: Atherosclerotic calcification is present within the non-aneurysmal abdominal aorta, without hemodynamically significant stenosis. --No retroperitoneal lymphadenopathy. --No mesenteric lymphadenopathy. --No pelvic or inguinal lymphadenopathy. Reproductive: Brachytherapy beads are noted Other: There is nonspecific retroperitoneal fluid along the right psoas muscle. There is a fat containing umbilical hernia. Musculoskeletal. No acute displaced fractures. IMPRESSION: 1. There are bilateral nondisplaced anterior rib fractures without evidence for pneumothorax. 2. There is a metallic foreign body in the gastric body measuring approximately 2.1 cm. This may represent the reported swallowed hearing aid. 3. There appears to be mild wall thickening and possible adjacent fat stranding involving the proximal duodenum. This may represent duodenitis/peptic ulcer disease versus pancreatitis. There is a small volume of retroperitoneal free fluid is felt to be related. 4. There is a moderate-sized right-sided pleural effusion and a small left-sided pleural effusion. 5. There is a 5 mm pulmonary nodule in the left upper lobe. No follow-up needed if patient is low-risk. Non-contrast chest CT can be considered in 12 months if patient is high-risk. This recommendation follows the consensus statement: Guidelines for Management of Incidental Pulmonary Nodules Detected on CT Images: From the Fleischner Society 2017; Radiology 2017; 284:228-243. 6. There are indeterminate bilateral renal nodules favored to represent hemorrhagic or proteinaceous cysts. Follow-up with a nonemergent outpatient renal ultrasound is recommended. 7. Cardiomegaly and coronary artery calcifications. There are findings suggestive of volume overload and developing pulmonary edema. 8. Anemia. 9. Ascending thoracic aortic aneurysm measuring 4.2 cm. Recommend annual imaging followup by CTA or MRA. This recommendation follows 2010  ACCF/AHA/AATS/ACR/ASA/SCA/SCAI/SIR/STS/SVM Guidelines for the Diagnosis and Management of Patients with Thoracic Aortic Disease. Circulation. 2010; 121: B353-G992. Aortic aneurysm NOS (ICD10-I71.9) 10. No acute fracture involving the thoracic or lumbar spine. Aortic Atherosclerosis (ICD10-I70.0). Electronically Signed   By: Constance Holster M.D.   On: 12/21/2020 02:31    Labs: BMET Recent Labs  Lab 12/21/20 0007 12/21/20 0200 12/21/20 0848 12/21/20 1921 12/22/20 0754  NA 122* 122* 121* 123* 126*  K 4.9 4.8 4.6 4.3 4.1  CL 92* 92* 91* 91* 94*  CO2 20* 18* 20* 23 20*  GLUCOSE 118* 107* 86 110* 96  BUN 80* 80* 79* 77* 80*  CREATININE 4.45* 4.32* 4.36* 4.43* 4.43*  CALCIUM 10.8* 10.5* 10.0 9.9 9.9  PHOS  --   --   --  5.9*  --    CBC Recent Labs  Lab 12/21/20 0007 12/22/20 0754  WBC 10.3 12.4*  NEUTROABS 8.3*  --   HGB 8.7* 9.2*  HCT 25.5* 27.1*  MCV 91.1 90.0  PLT 140* 141*    Medications:    . allopurinol  100 mg Oral Daily  . amLODipine  5 mg Oral Daily  .  aspirin EC  81 mg Oral Daily  . carvedilol  25 mg Oral BID WC  . furosemide  80 mg Intravenous BID  . latanoprost  1 drop Both Eyes QHS  . lidocaine  1 patch Transdermal Q24H  . lidocaine  1 patch Transdermal Q24H  . pantoprazole  40 mg Oral Daily  . rosuvastatin  5 mg Oral Q M,W,F  . sodium chloride flush  3 mL Intravenous Q12H      Madelon Lips, MD 12/22/2020, 11:58 AM

## 2020-12-22 NOTE — Anesthesia Preprocedure Evaluation (Addendum)
Anesthesia Evaluation  Patient identified by MRN, date of birth, ID band Patient awake    Reviewed: Allergy & Precautions, NPO status , Patient's Chart, lab work & pertinent test results  Airway Mallampati: II  TM Distance: >3 FB Neck ROM: Full    Dental no notable dental hx. (+) Teeth Intact, Dental Advisory Given   Pulmonary former smoker,    Pulmonary exam normal breath sounds clear to auscultation       Cardiovascular hypertension, + CAD and + Past MI  Normal cardiovascular exam Rhythm:Regular Rate:Normal  EF 45-50%   Neuro/Psych negative neurological ROS  negative psych ROS   GI/Hepatic Neg liver ROS, GERD  ,  Endo/Other  negative endocrine ROS  Renal/GU Renal disease     Musculoskeletal negative musculoskeletal ROS (+)   Abdominal (+) + obese,   Peds  Hematology  (+) anemia ,   Anesthesia Other Findings   Reproductive/Obstetrics                            Anesthesia Physical Anesthesia Plan  ASA: III  Anesthesia Plan: General   Post-op Pain Management:    Induction: Intravenous  PONV Risk Score and Plan: 3 and Treatment may vary due to age or medical condition and Ondansetron  Airway Management Planned: Oral ETT  Additional Equipment: None  Intra-op Plan:   Post-operative Plan: Extubation in OR  Informed Consent: I have reviewed the patients History and Physical, chart, labs and discussed the procedure including the risks, benefits and alternatives for the proposed anesthesia with the patient or authorized representative who has indicated his/her understanding and acceptance.     Dental advisory given  Plan Discussed with: CRNA and Anesthesiologist  Anesthesia Plan Comments: ( Foreign body extraction )     Anesthesia Quick Evaluation

## 2020-12-22 NOTE — Evaluation (Signed)
Occupational Therapy Evaluation Patient Details Name: [name] [name] MRN: [id number] DOB: 1933-08-27 Today's Date: 12/22/2020    History of Present Illness Pt adm 4/5 with falls and hyponatremia. Pt found to have bilateral nondisplaced anterior rib fx's. PMH - HTN, afib, cad, chf, ckd, prostate CA   Clinical Impression   History obtained from patient's son as patient is groggy and hard of hearing. Per son patient has been staying with family since November as his walking has steadily declined with report of multiple recent falls. Patient has been living with his daughter with 24/7 support available. When patient steady son reports he is very independent "stubborn" and performs all of his self care without assistance. Provided patient with a walker but "he won't use it." Currently patient presenting with decreased activity tolerance, sitting balance, safety awareness, cognition asking "where am I?" Patient needing consistent supervision and up to mod A for sitting balance at edge of bed due to posterior and R lateral lean. Multimodal cues used to facilitate balance and try scooting to head of bed, patient needing max cues and mod A to scoot hips up to head of bed. Due to grogginess, poor sitting balance and +1 assist available did not attempt standing this session. Recommend continued acute OT services to maximize patient safety and independence with self care in order to facilitate D/C to venue listed below. If patient/family declines short term rehab at discharge would recommend 24/7 assist and [facility] services.     Follow Up Recommendations  SNF    Equipment Recommendations  None recommended by OT       Precautions / Restrictions Precautions Precautions: Fall Precaution Comments: son reports about 5 falls in the past 3 months Restrictions Weight Bearing Restrictions: No      Mobility Bed Mobility Overal bed mobility: Needs Assistance Bed Mobility: Rolling;Sidelying to Sit;Sit to  Sidelying Rolling: Min assist Sidelying to sit: Mod assist;HOB elevated     Sit to sidelying: Mod assist General bed mobility comments: patient needing increased assist with trunk management to sit upright and for lifting LEs back into bed. patient required max multimodal cues to sequence scooting hips up to head of bed and mod A to scoot    Transfers                 General transfer comment: deferred for safety, +1 available and patient with poor sitting balance, decreased alertness    Balance Overall balance assessment: Needs assistance;History of Falls Sitting-balance support: Feet supported;Bilateral upper extremity supported Sitting balance-Leahy Scale: Poor Sitting balance - Comments: fair to poor, instructed patient to place hands on his knees to correct posterior lean, then patient having tendency to lean to his R side needing up to mod A for safety Postural control: Posterior lean;Right lateral lean     Standing balance comment: not tested                           ADL either performed or assessed with clinical judgement   ADL Overall ADL's : Needs assistance/impaired     Grooming: Supervision/safety;Bed level   Upper Body Bathing: Minimal assistance;Bed level   Lower Body Bathing: Maximal assistance;Bed level;Sitting/lateral leans   Upper Body Dressing : Moderate assistance;Sitting Upper Body Dressing Details (indicate cue type and reason): poor sitting balance Lower Body Dressing: Total assistance;Bed level;Sitting/lateral leans     Toilet Transfer Details (indicate cue type and reason): did not attempt, patient with poor sitting balance, limited alertness  and 1 assist available therefore deferred for safety Toileting- Clothing Manipulation and Hygiene: Total assistance;Bed level       Functional mobility during ADLs: Moderate assistance (scooting EOB) General ADL Comments: patient requiring increased assistance with self care tasks due to  decreased cognition, safety awareness, balance, activity tolerance, and pain                  Pertinent Vitals/Pain Pain Assessment: Faces Faces Pain Scale: Hurts little more Pain Location: UEs with movement Pain Descriptors / Indicators: Guarding;Grimacing Pain Intervention(s): Premedicated before session     Hand Dominance Right   Extremity/Trunk Assessment Upper Extremity Assessment Upper Extremity Assessment: Generalized weakness   Lower Extremity Assessment Lower Extremity Assessment: Defer to PT evaluation       Communication Communication Communication: HOH   Cognition Arousal/Alertness: Lethargic;Suspect due to medications Behavior During Therapy: [facility] for tasks assessed/performed Overall Cognitive Status: Impaired/Different from baseline Area of Impairment: Orientation;Attention;Memory;Following commands;Safety/judgement;Problem solving                 Orientation Level: Disoriented to;Place;Situation Current Attention Level: Sustained Memory: Decreased short-term memory Following Commands: Follows one step commands with increased time Safety/Judgement: Decreased awareness of safety   Problem Solving: Requires verbal cues;Requires tactile cues General Comments: patient appears groggy, son states patient had rough morning and is likely exhausted. did receive tylenol prior to OT. patient also [facility] which may be contributing as well              Home Living Family/patient expects to be discharged to:: Private residence Living Arrangements: Children;Other (Comment) (Daughter) Available Help at Discharge: Family;Available 24 hours/day Type of Home: House Home Access: Stairs to enter [employer] of Steps: 2 + 3 Entrance Stairs-Rails: Right;Left Home Layout: Two level;Able to live on main level with bedroom/bathroom     Bathroom Shower/Tub: Occupational psychologist: Standard     Home Equipment: Toilet riser;Grab bars -  tub/shower;Tub bench;Walker - 2 wheels          Prior Functioning/Environment Level of Independence: Independent                 OT Problem List: Decreased strength;Decreased activity tolerance;Decreased range of motion;Impaired balance (sitting and/or standing);Decreased cognition;Decreased safety awareness;Decreased knowledge of use of DME or AE;Pain      OT Treatment/Interventions: Self-care/ADL training;Therapeutic exercise;DME and/or AE instruction;Therapeutic activities;Patient/family education;Balance training;Cognitive remediation/compensation    OT Goals(Current goals can be found in the care plan section) Acute Rehab OT Goals Patient Stated Goal: become mobile OT Goal Formulation: With family Time For Goal Achievement: 01/05/21 Potential to Achieve Goals: Good  OT Frequency: Min 2X/week    AM-PAC OT "6 Clicks" Daily Activity     Outcome Measure Help from another person eating meals?: A Little Help from another person taking care of personal grooming?: A Little Help from another person toileting, which includes using toliet, bedpan, or urinal?: Total Help from another person bathing (including washing, rinsing, drying)?: A Lot Help from another person to put on and taking off regular upper body clothing?: A Lot Help from another person to put on and taking off regular lower body clothing?: Total 6 Click Score: 12   End of Session Nurse Communication: Mobility status  Activity Tolerance: Patient limited by lethargy Patient left: in bed;with call bell/phone within reach;with bed alarm set;with family/visitor present  OT Visit Diagnosis: Other abnormalities of gait and mobility (R26.89);Repeated falls (R29.6);Muscle weakness (generalized) (M62.81);History of falling (Z91.81);Pain;Other symptoms and signs involving cognitive  function Pain - part of body: Shoulder;Arm (bilateral)                Time: 5396-7289 OT Time Calculation (min): 25 min Charges:  OT General  Charges $OT Visit: 1 Visit OT Evaluation $OT Eval Low Complexity: 1 Low OT Treatments $Self Care/Home Management : 8-22 mins  Delbert Phenix OT OT pager: [place] 12/22/2020, 2:26 PM

## 2020-12-22 NOTE — Progress Notes (Signed)
[facility] Gastroenterology Progress Note  [name] [name] 87 y.o. [birth year]  CC:  Gastric foreign body  Subjective: Patient reports pain in ribs or shoulder.  Denies abdominal pain.  Last BM 3 days ago.  Son, at bedside, showed battery for hearing aid, which is a button battery.  ROS : Review of Systems  Cardiovascular: Negative for chest pain and palpitations.  Gastrointestinal: Positive for constipation. Negative for abdominal pain, blood in stool, diarrhea, heartburn, melena, nausea and vomiting.   Objective: Vital signs in last 24 hours: Vitals:   12/21/20 2341 12/22/20 0358  BP: 131/71 138/81  Pulse: 61 82  Resp: 20 20  Temp:  (!) 97.5 F (36.4 C)  SpO2: 99% 99%    Physical Exam:  General:  Lethargic, cooperative, no distress  Head:  Normocephalic, without obvious abnormality, atraumatic  Eyes:  Anicteric sclera, EOMs intact   Lungs:   Clear to auscultation bilaterally, respirations unlabored  Heart:  Regular rate and rhythm, S1, S2 normal  Abdomen:   Soft, non-tender, non-distended, bowel sounds active all four quadrants,  no masses,   Extremities: +bilateral lower extremity edema    Lab Results: Recent Labs    12/21/20 1921 12/22/20 0754  NA 123* 126*  K 4.3 4.1  CL 91* 94*  CO2 23 20*  GLUCOSE 110* 96  BUN 77* 80*  CREATININE 4.43* 4.43*  CALCIUM 9.9 9.9  PHOS 5.9*  --    Recent Labs    12/21/20 0007 12/21/20 1921  AST 63*  --   ALT 41  --   ALKPHOS 84  --   BILITOT 1.3*  --   PROT 5.8*  --   ALBUMIN 3.2* 3.0*   Recent Labs    12/21/20 0007 12/22/20 0754  WBC 10.3 12.4*  NEUTROABS 8.3*  --   HGB 8.7* 9.2*  HCT 25.5* 27.1*  MCV 91.1 90.0  PLT 140* 141*   No results for input(s): LABPROT, INR in the last 72 hours.    Assessment: Gastric foreign body: accidental ingestion of hearing aid 48-72h ago  Anemia.  No active GI bleeding.  Hemoccult negative. -Hgb 9.2, stable  Duodenitis, possible peptic ulcer, based on CT scan.  Severe  hyponatremia: Na 126 today, improved from 122 on arrival  Fall with facial and chest trauma, rib fractures  Acute on chronic heart failure   AKI on CKD: BUN 80/ Cr 4.43  Chronic anticoagulation, apixaban  Duodenitis, possible peptic ulcer, based on CT scan  Plan: Repeat abdominal x-ray today to determine if hearing aid remains within the stomach.  Bedside x-ray due to patient discomfort related to rib fractures/shoulder injury.  If hearing aid remains in the stomach, plan for EGD tomorrow, if sodium is >130 and cardiac clearance is obtained.  Discussed with Dr. Paulita Fujita: despite concern regarding battery in hearing aid, patient not currently stable to undergo anesthesia for EGD with foreign body removal.  Would increase Protonix to BID dosing (IV) if OK from renal standpoint.  [facility] will follow.  Salley Slaughter PA-C 12/22/2020, 9:43 AM  Contact #  [phone]

## 2020-12-22 NOTE — Plan of Care (Signed)
  Problem: Safety: Goal: Ability to remain free from injury will improve Outcome: Progressing   

## 2020-12-22 NOTE — Evaluation (Signed)
Physical Therapy Evaluation Patient Details Name: [name] [name] MRN: [id number] DOB: 24-Dec-1932 Today's Date: 12/22/2020   History of Present Illness  Pt adm 4/5 with falls and hyponatremia. Pt found to have bilateral nondisplaced anterior rib fx's. Pt also inadvertantly had swallowed his hearing aide. GI to retrieve hearing aide. PMH - HTN, afib, cad, chf, ckd, prostate CA  Clinical Impression  Pt presents to PT with recent decline from baseline in functional mobility. Was amb at home with walker with supervision and now requiring mod assist to stand with Stedy. Expect pt will make steady progress with therapy and recommend ST-SNF for further rehab.     Follow Up Recommendations SNF    Equipment Recommendations  Wheelchair (measurements PT);Wheelchair cushion (measurements PT)    Recommendations for Other Services       Precautions / Restrictions Precautions Precautions: Fall Precaution Comments: son reports about 5 falls in the past 3 months Restrictions Weight Bearing Restrictions: No      Mobility  Bed Mobility Overal bed mobility: Needs Assistance Bed Mobility: Rolling;Sidelying to Sit;Sit to Sidelying Rolling: Min assist Sidelying to sit: Mod assist;HOB elevated     Sit to sidelying: Mod assist General bed mobility comments: Max verbal/tactile cues for sequencing. Assist to bring legs off of bed and elevate trunk into sitting. Assist to bring legs back up into bed returning to sidelying    Transfers Overall transfer level: Needs assistance Equipment used: Ambulation equipment used Transfers: Sit to/from Stand Sit to Stand: Mod assist;From elevated surface         General transfer comment: Assist to bring hips up and for balance. Stood with Charlaine Dalton for safety and incr support.  Ambulation/Gait             General Gait Details: did not attempt with 1 person assist  Stairs            Wheelchair Mobility    Modified Rankin (Stroke Patients Only)        Balance Overall balance assessment: Needs assistance;History of Falls Sitting-balance support: Feet supported;Bilateral upper extremity supported Sitting balance-Leahy Scale: Poor Sitting balance - Comments: Pt sat EOB x 10-12 minutes with min guard to min assist Postural control: Posterior lean;Right lateral lean Standing balance support: Bilateral upper extremity supported Standing balance-Leahy Scale: Poor Standing balance comment: Stedy and min assist for static standing                             Pertinent Vitals/Pain Pain Assessment: Faces Faces Pain Scale: Hurts little more Pain Location: UEs with movement Pain Descriptors / Indicators: Guarding;Grimacing Pain Intervention(s): Limited activity within patient's tolerance;Monitored during session;Repositioned    Home Living Family/patient expects to be discharged to:: Private residence Living Arrangements: Children;Other (Comment) (daughter with recent neck surgery and not able to lift) Available Help at Discharge: Family;Available 24 hours/day Type of Home: House Home Access: Stairs to enter Entrance Stairs-Rails: Right;Left Entrance Stairs-Number of Steps: 2 + 3 Home Layout: Two level;Able to live on main level with bedroom/bathroom Home Equipment: Toilet riser;Grab bars - tub/shower;Tub bench;Walker - 2 wheels      Prior Function Level of Independence: Needs assistance   Gait / Transfers Assistance Needed: Pt with decline in mobility since November. Uses rolling walker with supervision with daughter following with chair for when pt becomes tired           Hand Dominance   Dominant Hand: Right    Extremity/Trunk Assessment  Upper Extremity Assessment Upper Extremity Assessment: Defer to OT evaluation    Lower Extremity Assessment Lower Extremity Assessment: Generalized weakness       Communication   Communication: HOH  Cognition Arousal/Alertness: Awake/alert;Lethargic (Sleepy but  aroused and stayed awake if stimulated. Quickly back to sleep if not stimulated) Behavior During Therapy: WFL for tasks assessed/performed Overall Cognitive Status: Impaired/Different from baseline Area of Impairment: Orientation;Attention;Memory;Following commands;Safety/judgement;Problem solving                 Orientation Level: Disoriented to;Place;Situation Current Attention Level: Sustained Memory: Decreased short-term memory Following Commands: Follows one step commands with increased time;Follows one step commands inconsistently Safety/Judgement: Decreased awareness of safety   Problem Solving: Requires verbal cues;Requires tactile cues;Slow processing;Difficulty sequencing General Comments: Pt with motor planning deficits. Wouldn't pick up cup of water when he wanted a drink but spontaneously reached up and scratched his head with same hand      General Comments General comments (skin integrity, edema, etc.): Daughter present    Exercises     Assessment/Plan    PT Assessment Patient needs continued PT services  PT Problem List Decreased strength;Decreased activity tolerance;Decreased balance;Decreased mobility;Decreased cognition;Pain       PT Treatment Interventions DME instruction;Gait training;Functional mobility training;Therapeutic activities;Therapeutic exercise;Balance training;Cognitive remediation;Patient/family education    PT Goals (Current goals can be found in the Care Plan section)  Acute Rehab PT Goals Patient Stated Goal: become mobile PT Goal Formulation: With patient/family Time For Goal Achievement: 01/05/21 Potential to Achieve Goals: Good    Frequency Min 2X/week   Barriers to discharge        Co-evaluation               AM-PAC PT "6 Clicks" Mobility  Outcome Measure Help needed turning from your back to your side while in a flat bed without using bedrails?: A Little Help needed moving from lying on your back to sitting on the  side of a flat bed without using bedrails?: A Lot Help needed moving to and from a bed to a chair (including a wheelchair)?: Total Help needed standing up from a chair using your arms (e.g., wheelchair or bedside chair)?: A Lot Help needed to walk in hospital room?: Total Help needed climbing 3-5 steps with a railing? : Total 6 Click Score: 10    End of Session Equipment Utilized During Treatment: Gait belt Activity Tolerance: Patient limited by pain Patient left: in bed;with call bell/phone within reach;with bed alarm set;with family/visitor present Nurse Communication: Mobility status (nurse tech) PT Visit Diagnosis: Other abnormalities of gait and mobility (R26.89);Muscle weakness (generalized) (M62.81);History of falling (Z91.81)    Time: 9371-6967 PT Time Calculation (min) (ACUTE ONLY): 28 min   Charges:   PT Evaluation $PT Eval Moderate Complexity: 1 Mod PT Treatments $Therapeutic Activity: 8-22 mins        [place] Pager [phone] Office [place] 12/22/2020, 5:39 PM

## 2020-12-22 NOTE — Progress Notes (Signed)
  Echocardiogram 2D Echocardiogram has been performed.  Bobbye Charleston 12/22/2020, 4:10 PM

## 2020-12-22 NOTE — Progress Notes (Signed)
Cardiology Progress Note  Patient ID: [name] [name] MRN: [id number] DOB: 1933-09-15 Date of Encounter: 12/22/2020  Primary Cardiologist: Evalina Field, MD  Subjective   Chief Complaint: Confused, drowsy this morning  HPI: 1.2 L of urine output.  Still drowsy this morning.  Sodium improving.  ROS:  All other ROS reviewed and negative. Pertinent positives noted in the HPI.     Inpatient Medications  Scheduled Meds: . allopurinol  100 mg Oral Daily  . amLODipine  5 mg Oral Daily  . aspirin EC  81 mg Oral Daily  . carvedilol  25 mg Oral BID WC  . furosemide  80 mg Intravenous BID  . latanoprost  1 drop Both Eyes QHS  . lidocaine  1 patch Transdermal Q24H  . lidocaine  1 patch Transdermal Q24H  . pantoprazole  40 mg Oral Daily  . rosuvastatin  5 mg Oral Q M,W,F  . sodium chloride flush  3 mL Intravenous Q12H   Continuous Infusions: . sodium chloride     PRN Meds: sodium chloride, acetaminophen, HYDROcodone-acetaminophen, melatonin, ondansetron (ZOFRAN) IV, sodium chloride flush   Vital Signs   Vitals:   12/21/20 2020 12/21/20 2341 12/22/20 0358 12/22/20 0955  BP: 117/71 131/71 138/81 121/69  Pulse: (!) 52 61 82 61  Resp:  20 20 18   Temp:   (!) 97.5 F (36.4 C) 98.3 F (36.8 C)  TempSrc:   Oral   SpO2:  99% 99% 99%  Weight:      Height:        Intake/Output Summary (Last 24 hours) at 12/22/2020 1001 Last data filed at 12/22/2020 0956 Gross per 24 hour  Intake 300 ml  Output 1250 ml  Net -950 ml   Last 3 Weights 12/20/2020 11/28/2011 11/27/2011  Weight (lbs) 253 lb 8.5 oz 225 lb 12 oz 231 lb 4.2 oz  Weight (kg) 115 kg 102.4 kg 104.9 kg      Telemetry  Overnight telemetry shows atrial fibrillation heart rate in the 60s, which I personally reviewed.   Physical Exam   Vitals:   12/21/20 2020 12/21/20 2341 12/22/20 0358 12/22/20 0955  BP: 117/71 131/71 138/81 121/69  Pulse: (!) 52 61 82 61  Resp:  20 20 18   Temp:   (!) 97.5 F (36.4 C) 98.3 F (36.8 C)   TempSrc:   Oral   SpO2:  99% 99% 99%  Weight:      Height:         Intake/Output Summary (Last 24 hours) at 12/22/2020 1001 Last data filed at 12/22/2020 0956 Gross per 24 hour  Intake 300 ml  Output 1250 ml  Net -950 ml    Last 3 Weights 12/20/2020 11/28/2011 11/27/2011  Weight (lbs) 253 lb 8.5 oz 225 lb 12 oz 231 lb 4.2 oz  Weight (kg) 115 kg 102.4 kg 104.9 kg    Body mass index is 34.38 kg/m.  General: Well nourished, well developed, in no acute distress Head: Atraumatic, normal size  Eyes: PEERLA, EOMI  Neck: Supple, JVD 6 to 7 cm of water Endocrine: No thryomegaly Cardiac: Normal S1, S2; irregular rhythm, no murmurs rubs or gallops Lungs: Diminished breath sounds bilaterally Abd: Soft, nontender, no hepatomegaly  Ext: Warm extremities, trace edema Musculoskeletal: No deformities, BUE and BLE strength normal and equal Skin: Warm and dry, no rashes   Neuro: Alert, awake, drowsy  Labs  High Sensitivity Troponin:   Recent Labs  Lab 12/21/20 0007 12/21/20 0200  TROPONINIHS 14 13  Cardiac EnzymesNo results for input(s): TROPONINI in the last 168 hours. No results for input(s): TROPIPOC in the last 168 hours.  Chemistry Recent Labs  Lab 12/21/20 0007 12/21/20 0200 12/21/20 0848 12/21/20 1921 12/22/20 0754  NA 122*   < > 121* 123* 126*  K 4.9   < > 4.6 4.3 4.1  CL 92*   < > 91* 91* 94*  CO2 20*   < > 20* 23 20*  GLUCOSE 118*   < > 86 110* 96  BUN 80*   < > 79* 77* 80*  CREATININE 4.45*   < > 4.36* 4.43* 4.43*  CALCIUM 10.8*   < > 10.0 9.9 9.9  PROT 5.8*  --   --   --   --   ALBUMIN 3.2*  --   --  3.0*  --   AST 63*  --   --   --   --   ALT 41  --   --   --   --   ALKPHOS 84  --   --   --   --   BILITOT 1.3*  --   --   --   --   GFRNONAA 12*   < > 13* 12* 12*  ANIONGAP 10   < > 10 9 12    < > = values in this interval not displayed.    Hematology Recent Labs  Lab 12/21/20 0007 12/22/20 0754  WBC 10.3 12.4*  RBC 2.80* 3.01*  HGB 8.7* 9.2*  HCT 25.5*  27.1*  MCV 91.1 90.0  MCH 31.1 30.6  MCHC 34.1 33.9  RDW 15.0 15.1  PLT 140* 141*   BNP Recent Labs  Lab 12/21/20 1203  BNP 969.4*    DDimer No results for input(s): DDIMER in the last 168 hours.   Radiology  CT Head Wo Contrast  Result Date: 12/21/2020 CLINICAL DATA:  Fall EXAM: CT HEAD WITHOUT CONTRAST TECHNIQUE: Contiguous axial images were obtained from the base of the skull through the vertex without intravenous contrast. COMPARISON:  None. FINDINGS: Brain: There is atrophy and chronic small vessel disease changes. No acute intracranial abnormality. Specifically, no hemorrhage, hydrocephalus, mass lesion, acute infarction, or significant intracranial injury. Vascular: No hyperdense vessel or unexpected calcification. Skull: No acute calvarial abnormality. Sinuses/Orbits: No acute findings Other: None IMPRESSION: Atrophy, chronic microvascular disease. No acute intracranial abnormality. Electronically Signed   By: Rolm Baptise M.D.   On: 12/21/2020 01:47   CT Cervical Spine Wo Contrast  Result Date: 12/21/2020 CLINICAL DATA:  Fall EXAM: CT CERVICAL SPINE WITHOUT CONTRAST TECHNIQUE: Multidetector CT imaging of the cervical spine was performed without intravenous contrast. Multiplanar CT image reconstructions were also generated. COMPARISON:  None. FINDINGS: Alignment: Slight anterolisthesis of C4 on C5 related to facet disease. Skull base and vertebrae: No acute fracture. No primary bone lesion or focal pathologic process. Soft tissues and spinal canal: No prevertebral fluid or swelling. No visible canal hematoma. Disc levels: Diffuse degenerative disc disease, most pronounced in the lower lumbar spine. Diffuse degenerative facet disease bilaterally. Upper chest: Right pleural effusion partially imaged. Other: None IMPRESSION: Degenerative changes.  No acute bony abnormality. Right pleural effusion. Electronically Signed   By: Rolm Baptise M.D.   On: 12/21/2020 01:49   CT T-SPINE NO  CHARGE  Result Date: 12/21/2020 CLINICAL DATA:  Acute pain due to trauma. Bilateral shoulder pain. Possible swallowed foreign body. EXAM: CT CHEST, ABDOMEN AND PELVIS WITHOUT CONTRAST CT THORACIC AND LUMBAR SPINE WITHOUT CONTRAST TECHNIQUE:  Multidetector CT imaging of the chest, abdomen and pelvis was performed following the standard protocol without IV contrast. Multiplanar CT images of the thoracic and lumbar spine were reconstructed from contemporary CT of the Chest, Abdomen, and Pelvis COMPARISON:  None. FINDINGS: CT CHEST FINDINGS Cardiovascular: Atherosclerotic changes are noted of the thoracic aorta. There is an ascending thoracic aortic aneurysm measuring approximately 4.2 cm. The heart size is enlarged. Coronary artery calcifications are noted. There is a trace pericardial effusion. The intracardiac blood pool is hypodense relative to the adjacent myocardium consistent with anemia. Mediastinum/Nodes: -- No mediastinal lymphadenopathy. -- No hilar lymphadenopathy. -- No axillary lymphadenopathy. -- No supraclavicular lymphadenopathy. -- Normal thyroid gland where visualized. -  Unremarkable esophagus. Lungs/Pleura: There is a moderate-sized right-sided pleural effusion. There is a small left-sided pleural effusion. There are prominent interstitial lung markings bilaterally. No pneumothorax. There are few scattered ground-glass airspace opacities bilaterally. There is a 5 mm pulmonary nodule in the left upper lobe (axial series 6, image 58). Musculoskeletal: There are acute to subacute appearing anterior rib fractures on the right involving the third and fourth ribs. There appears to be a nondisplaced acute fracture involving the anterior second rib on the left. There are old healed anterior left-sided rib fractures. There are healing fractures involving the left seventh eighth ribs anteriorly. CT ABDOMEN PELVIS FINDINGS Hepatobiliary: Hepatic cysts are noted. Normal gallbladder.There is no biliary ductal  dilation. Pancreas: Normal contours without ductal dilatation. No peripancreatic fluid collection. Spleen: Unremarkable. Adrenals/Urinary Tract: --Adrenal glands: Unremarkable. --Right kidney/ureter: Multiple complex and simple appearing cysts are noted involving the right kidney. --Left kidney/ureter: Multiple complex and simple appearing cysts are noted on the patient's left. There is an indeterminate exophytic hyperdense nodule rising from the interpolar region of the left kidney measuring approximately 2.5 cm. This nodule contains multiple calcifications (axial series 3, image 72). --Urinary bladder: Unremarkable. Stomach/Bowel: --Stomach/Duodenum: There is a metallic foreign body in the gastric body measuring approximately 2.1 cm (axial series 3, image 68). --Small bowel: There appears to be mild wall thickening and possible adjacent fat stranding involving the proximal duodenum. --Colon: Rectosigmoid diverticulosis without acute inflammation. --Appendix: Normal. Vascular/Lymphatic: Atherosclerotic calcification is present within the non-aneurysmal abdominal aorta, without hemodynamically significant stenosis. --No retroperitoneal lymphadenopathy. --No mesenteric lymphadenopathy. --No pelvic or inguinal lymphadenopathy. Reproductive: Brachytherapy beads are noted Other: There is nonspecific retroperitoneal fluid along the right psoas muscle. There is a fat containing umbilical hernia. Musculoskeletal. No acute displaced fractures. IMPRESSION: 1. There are bilateral nondisplaced anterior rib fractures without evidence for pneumothorax. 2. There is a metallic foreign body in the gastric body measuring approximately 2.1 cm. This may represent the reported swallowed hearing aid. 3. There appears to be mild wall thickening and possible adjacent fat stranding involving the proximal duodenum. This may represent duodenitis/peptic ulcer disease versus pancreatitis. There is a small volume of retroperitoneal free fluid  is felt to be related. 4. There is a moderate-sized right-sided pleural effusion and a small left-sided pleural effusion. 5. There is a 5 mm pulmonary nodule in the left upper lobe. No follow-up needed if patient is low-risk. Non-contrast chest CT can be considered in 12 months if patient is high-risk. This recommendation follows the consensus statement: Guidelines for Management of Incidental Pulmonary Nodules Detected on CT Images: From the Fleischner Society 2017; Radiology 2017; 284:228-243. 6. There are indeterminate bilateral renal nodules favored to represent hemorrhagic or proteinaceous cysts. Follow-up with a nonemergent outpatient renal ultrasound is recommended. 7. Cardiomegaly and coronary artery calcifications. There are findings  suggestive of volume overload and developing pulmonary edema. 8. Anemia. 9. Ascending thoracic aortic aneurysm measuring 4.2 cm. Recommend annual imaging followup by CTA or MRA. This recommendation follows 2010 ACCF/AHA/AATS/ACR/ASA/SCA/SCAI/SIR/STS/SVM Guidelines for the Diagnosis and Management of Patients with Thoracic Aortic Disease. Circulation. 2010; 121: R518-A416. Aortic aneurysm NOS (ICD10-I71.9) 10. No acute fracture involving the thoracic or lumbar spine. Aortic Atherosclerosis (ICD10-I70.0). Electronically Signed   By: Constance Holster M.D.   On: 12/21/2020 02:31   CT L-SPINE NO CHARGE  Result Date: 12/21/2020 CLINICAL DATA:  Acute pain due to trauma. Bilateral shoulder pain. Possible swallowed foreign body. EXAM: CT CHEST, ABDOMEN AND PELVIS WITHOUT CONTRAST CT THORACIC AND LUMBAR SPINE WITHOUT CONTRAST TECHNIQUE: Multidetector CT imaging of the chest, abdomen and pelvis was performed following the standard protocol without IV contrast. Multiplanar CT images of the thoracic and lumbar spine were reconstructed from contemporary CT of the Chest, Abdomen, and Pelvis COMPARISON:  None. FINDINGS: CT CHEST FINDINGS Cardiovascular: Atherosclerotic changes are noted  of the thoracic aorta. There is an ascending thoracic aortic aneurysm measuring approximately 4.2 cm. The heart size is enlarged. Coronary artery calcifications are noted. There is a trace pericardial effusion. The intracardiac blood pool is hypodense relative to the adjacent myocardium consistent with anemia. Mediastinum/Nodes: -- No mediastinal lymphadenopathy. -- No hilar lymphadenopathy. -- No axillary lymphadenopathy. -- No supraclavicular lymphadenopathy. -- Normal thyroid gland where visualized. -  Unremarkable esophagus. Lungs/Pleura: There is a moderate-sized right-sided pleural effusion. There is a small left-sided pleural effusion. There are prominent interstitial lung markings bilaterally. No pneumothorax. There are few scattered ground-glass airspace opacities bilaterally. There is a 5 mm pulmonary nodule in the left upper lobe (axial series 6, image 58). Musculoskeletal: There are acute to subacute appearing anterior rib fractures on the right involving the third and fourth ribs. There appears to be a nondisplaced acute fracture involving the anterior second rib on the left. There are old healed anterior left-sided rib fractures. There are healing fractures involving the left seventh eighth ribs anteriorly. CT ABDOMEN PELVIS FINDINGS Hepatobiliary: Hepatic cysts are noted. Normal gallbladder.There is no biliary ductal dilation. Pancreas: Normal contours without ductal dilatation. No peripancreatic fluid collection. Spleen: Unremarkable. Adrenals/Urinary Tract: --Adrenal glands: Unremarkable. --Right kidney/ureter: Multiple complex and simple appearing cysts are noted involving the right kidney. --Left kidney/ureter: Multiple complex and simple appearing cysts are noted on the patient's left. There is an indeterminate exophytic hyperdense nodule rising from the interpolar region of the left kidney measuring approximately 2.5 cm. This nodule contains multiple calcifications (axial series 3, image 72).  --Urinary bladder: Unremarkable. Stomach/Bowel: --Stomach/Duodenum: There is a metallic foreign body in the gastric body measuring approximately 2.1 cm (axial series 3, image 68). --Small bowel: There appears to be mild wall thickening and possible adjacent fat stranding involving the proximal duodenum. --Colon: Rectosigmoid diverticulosis without acute inflammation. --Appendix: Normal. Vascular/Lymphatic: Atherosclerotic calcification is present within the non-aneurysmal abdominal aorta, without hemodynamically significant stenosis. --No retroperitoneal lymphadenopathy. --No mesenteric lymphadenopathy. --No pelvic or inguinal lymphadenopathy. Reproductive: Brachytherapy beads are noted Other: There is nonspecific retroperitoneal fluid along the right psoas muscle. There is a fat containing umbilical hernia. Musculoskeletal. No acute displaced fractures. IMPRESSION: 1. There are bilateral nondisplaced anterior rib fractures without evidence for pneumothorax. 2. There is a metallic foreign body in the gastric body measuring approximately 2.1 cm. This may represent the reported swallowed hearing aid. 3. There appears to be mild wall thickening and possible adjacent fat stranding involving the proximal duodenum. This may represent duodenitis/peptic ulcer disease versus  pancreatitis. There is a small volume of retroperitoneal free fluid is felt to be related. 4. There is a moderate-sized right-sided pleural effusion and a small left-sided pleural effusion. 5. There is a 5 mm pulmonary nodule in the left upper lobe. No follow-up needed if patient is low-risk. Non-contrast chest CT can be considered in 12 months if patient is high-risk. This recommendation follows the consensus statement: Guidelines for Management of Incidental Pulmonary Nodules Detected on CT Images: From the Fleischner Society 2017; Radiology 2017; 284:228-243. 6. There are indeterminate bilateral renal nodules favored to represent hemorrhagic or  proteinaceous cysts. Follow-up with a nonemergent outpatient renal ultrasound is recommended. 7. Cardiomegaly and coronary artery calcifications. There are findings suggestive of volume overload and developing pulmonary edema. 8. Anemia. 9. Ascending thoracic aortic aneurysm measuring 4.2 cm. Recommend annual imaging followup by CTA or MRA. This recommendation follows 2010 ACCF/AHA/AATS/ACR/ASA/SCA/SCAI/SIR/STS/SVM Guidelines for the Diagnosis and Management of Patients with Thoracic Aortic Disease. Circulation. 2010; 121: X106-Y694. Aortic aneurysm NOS (ICD10-I71.9) 10. No acute fracture involving the thoracic or lumbar spine. Aortic Atherosclerosis (ICD10-I70.0). Electronically Signed   By: Constance Holster M.D.   On: 12/21/2020 02:31   CT CHEST ABDOMEN PELVIS WO CONTRAST  Result Date: 12/21/2020 CLINICAL DATA:  Acute pain due to trauma. Bilateral shoulder pain. Possible swallowed foreign body. EXAM: CT CHEST, ABDOMEN AND PELVIS WITHOUT CONTRAST CT THORACIC AND LUMBAR SPINE WITHOUT CONTRAST TECHNIQUE: Multidetector CT imaging of the chest, abdomen and pelvis was performed following the standard protocol without IV contrast. Multiplanar CT images of the thoracic and lumbar spine were reconstructed from contemporary CT of the Chest, Abdomen, and Pelvis COMPARISON:  None. FINDINGS: CT CHEST FINDINGS Cardiovascular: Atherosclerotic changes are noted of the thoracic aorta. There is an ascending thoracic aortic aneurysm measuring approximately 4.2 cm. The heart size is enlarged. Coronary artery calcifications are noted. There is a trace pericardial effusion. The intracardiac blood pool is hypodense relative to the adjacent myocardium consistent with anemia. Mediastinum/Nodes: -- No mediastinal lymphadenopathy. -- No hilar lymphadenopathy. -- No axillary lymphadenopathy. -- No supraclavicular lymphadenopathy. -- Normal thyroid gland where visualized. -  Unremarkable esophagus. Lungs/Pleura: There is a moderate-sized  right-sided pleural effusion. There is a small left-sided pleural effusion. There are prominent interstitial lung markings bilaterally. No pneumothorax. There are few scattered ground-glass airspace opacities bilaterally. There is a 5 mm pulmonary nodule in the left upper lobe (axial series 6, image 58). Musculoskeletal: There are acute to subacute appearing anterior rib fractures on the right involving the third and fourth ribs. There appears to be a nondisplaced acute fracture involving the anterior second rib on the left. There are old healed anterior left-sided rib fractures. There are healing fractures involving the left seventh eighth ribs anteriorly. CT ABDOMEN PELVIS FINDINGS Hepatobiliary: Hepatic cysts are noted. Normal gallbladder.There is no biliary ductal dilation. Pancreas: Normal contours without ductal dilatation. No peripancreatic fluid collection. Spleen: Unremarkable. Adrenals/Urinary Tract: --Adrenal glands: Unremarkable. --Right kidney/ureter: Multiple complex and simple appearing cysts are noted involving the right kidney. --Left kidney/ureter: Multiple complex and simple appearing cysts are noted on the patient's left. There is an indeterminate exophytic hyperdense nodule rising from the interpolar region of the left kidney measuring approximately 2.5 cm. This nodule contains multiple calcifications (axial series 3, image 72). --Urinary bladder: Unremarkable. Stomach/Bowel: --Stomach/Duodenum: There is a metallic foreign body in the gastric body measuring approximately 2.1 cm (axial series 3, image 68). --Small bowel: There appears to be mild wall thickening and possible adjacent fat stranding involving the proximal duodenum. --  Colon: Rectosigmoid diverticulosis without acute inflammation. --Appendix: Normal. Vascular/Lymphatic: Atherosclerotic calcification is present within the non-aneurysmal abdominal aorta, without hemodynamically significant stenosis. --No retroperitoneal lymphadenopathy.  --No mesenteric lymphadenopathy. --No pelvic or inguinal lymphadenopathy. Reproductive: Brachytherapy beads are noted Other: There is nonspecific retroperitoneal fluid along the right psoas muscle. There is a fat containing umbilical hernia. Musculoskeletal. No acute displaced fractures. IMPRESSION: 1. There are bilateral nondisplaced anterior rib fractures without evidence for pneumothorax. 2. There is a metallic foreign body in the gastric body measuring approximately 2.1 cm. This may represent the reported swallowed hearing aid. 3. There appears to be mild wall thickening and possible adjacent fat stranding involving the proximal duodenum. This may represent duodenitis/peptic ulcer disease versus pancreatitis. There is a small volume of retroperitoneal free fluid is felt to be related. 4. There is a moderate-sized right-sided pleural effusion and a small left-sided pleural effusion. 5. There is a 5 mm pulmonary nodule in the left upper lobe. No follow-up needed if patient is low-risk. Non-contrast chest CT can be considered in 12 months if patient is high-risk. This recommendation follows the consensus statement: Guidelines for Management of Incidental Pulmonary Nodules Detected on CT Images: From the Fleischner Society 2017; Radiology 2017; 284:228-243. 6. There are indeterminate bilateral renal nodules favored to represent hemorrhagic or proteinaceous cysts. Follow-up with a nonemergent outpatient renal ultrasound is recommended. 7. Cardiomegaly and coronary artery calcifications. There are findings suggestive of volume overload and developing pulmonary edema. 8. Anemia. 9. Ascending thoracic aortic aneurysm measuring 4.2 cm. Recommend annual imaging followup by CTA or MRA. This recommendation follows 2010 ACCF/AHA/AATS/ACR/ASA/SCA/SCAI/SIR/STS/SVM Guidelines for the Diagnosis and Management of Patients with Thoracic Aortic Disease. Circulation. 2010; 121: G644-I347. Aortic aneurysm NOS (ICD10-I71.9) 10. No  acute fracture involving the thoracic or lumbar spine. Aortic Atherosclerosis (ICD10-I70.0). Electronically Signed   By: Constance Holster M.D.   On: 12/21/2020 02:31    Patient Profile  87 year old male with history of CKD, CAD, hypertension, persistent atrial fibrillation, acid reflux who was admitted on 12/20/2020 with fatigue and found to have volume overload in the setting of AKI.     Assessment & Plan   1.  Volume overload/AKI/congestive heart failure -Admitted with volume overload and hyponatremia.  He does not appear that volume overloaded to me.  Sodium is improved with diuresis.  Due to profound AKI nephrology is assisting with diuresis.  I will defer this to nephrology. -Echocardiogram is pending.  Final recommendations regarding congestive heart failure management will be made pending this. -He is in no acute distress.  He may proceed with the EGD tomorrow. -Options for medical therapy will be limited given CKD stage V.  Hopefully his kidney function will improve.  Given his age and comorbidities I would recommend medical management regardless.  2.  Permanent atrial fibrillation -Rate controlled. -Eliquis being held for GI procedure.  3.  Hyponatremia -Secondary to volume overload in the setting of AKI and CHF.  Improving with diuresis.  Diuretics per renal.  For questions or updates, please contact [place] Please consult [email or web address] for contact info under   Time Spent with Patient: I have spent a total of 25 minutes with patient reviewing hospital notes, telemetry, EKGs, labs and examining the patient as well as establishing an assessment and plan that was discussed with the patient.  > 50% of time was spent in direct patient care.    Signed, Addison Naegeli. Audie Box, MD, [place]  12/22/2020 10:01 AM

## 2020-12-22 NOTE — Progress Notes (Signed)
PROGRESS NOTE    [name] [name]  PFX:[id number] DOB: 1933/07/24 DOA: 12/20/2020 PCP: Antony Contras, MD   Chief Complaint  Patient presents with  . Fall    Taking Eliquis  Brief Narrative:  72 old male with history of hypertension, A. fib on Eliquis, CAD history of PCI in 3299, chronic systolic CHF EF 24-2-[id number], GERD, chronic kidney disease stage III brought to the ED after fall at home.  He has had recent falls and while at the shower on 4/5 he fell.  Patient reported that he lost balance. Per report denies any loss of consciousness or trauma to his head. He has been ambulating with use of a walker.  Apparently patient has had severe back pain and sciatica for which he has been seen by physical therapy and has a scheduled appointment with orthopedics.  Due to the pain in his back he has not been sleeping well at night  In the ED he was afebrile, saturating well on room air, CT scan of the head and cervical spine showed no acute abnormality, but did note a partially visualized right-sided pleural effusion.  Labs significant for for hemoglobin 8.7, platelets 140, sodium 122, chloride 92, CO2 20, BUN 80, creatinine 4.45, calcium 10.8, AST 63, serum osmolarity 287, and TSH 2.577.  Urinalysis was positive for 30 mg/dL protein, but showed no significant signs of infection.  CT scan of the chest for bilateral nondisplaced rib fractures, a metallic foreign body in the gastric body measuring 2.1 cm possibly hearing aid, possible duodenitis/peptic ulcer disease, cardiomegaly with signs of fluid overload with pulmonary edema, and a 4.2 cm ascending aortic aneurysm.  patient was given normal saline IVFs at 75 ml/hr, Patient was admitted and nephrology, gastroenterology and cardiology were consulted.  Subjective: Seen and examined this morning. Son is at the bedside.  Patient is resting, intermittently sleepy, resting comfortably, has pain all over his body remaining intact posterior shoulders arms chest.   Able to move his lower extremities well. Overnight no fever.  Assessment & Plan:  multiple falls at home Chronic low back-was scheduled to see Morven o n4/18 per son. has had least 4  Falls at home  likely multifactorial in setting of hyponatremia and fluid overload chronic low back pain.Patient more sleepy after getting morphine in the ED.  Hold iv narcotics.  Continue to adjust pain medication  Multiple fractures of ribs, bilateral, initial encounter for closed fracture: Continue incentive spirometry, add lidocaine patch, pain control with Tylenol and if needed narcotics and family agrees.  PT OT evaluation  Acute encephalopathy following IV morphine in the ED family reluctant about opiates.  Discussed if he has uncontrolled pain in the abdomen consider tramadol or hydrocodone at least..  Hyponatremia: Suspect hypervolemic hyponatremia diuresing as per nephrology.  Monitor labs. Recent Labs  Lab 12/21/20 0007 12/21/20 0200 12/21/20 0848 12/21/20 1921 12/22/20 0754  NA 122* 122* 121* 123* 126*   AKI on CKD 3?-unknown baseline creatinine .  Fax number provided to the son to get labs from PCP today.  CT shows bilateral complex renal cyst.  Appreciate nephrology input on board, UA unremarkable.  Holding Cozaar and diuresis with IV Lasix.  Follow-up labs.  Discussed with nephrology this morning. Recent Labs  Lab 12/21/20 0007 12/21/20 0200 12/21/20 0848 12/21/20 1921 12/22/20 0754  BUN 80* 80* 79* 77* 80*  CREATININE 4.45* 4.32* 4.36* 4.43* 4.43*    Foreign body in stomach Imaging showing duodenitis/PUD: hearing aid in GIT in imaging Continue PPI, and  GI has been consulted.  Holding Eliquis patient will need endoscopy at some point  Iron deficiency anemia:guaiac was noted to be negative.  Likely from chronic kidney disease iron level 37.  Monitor H&H Recent Labs  Lab 12/21/20 0007 12/22/20 0754  HGB 8.7* 9.2*  HCT 25.5* 27.1*    HTN: Blood pressure is well  controlled.  Continue amlodipine 5 Coreg 25 Lasix.  Holding ACE or ARB CAD S/P angioplasty 2013:  torp neg 14>13. continue home aspirin, beta-blocker and statin.  Acute on chronic CHF: BNP elevated hypervolemic, cont in ivd diuresis.  Follow-up echocardiogram follow-up cardiology recommendation.  Persistent A. fib on Eliquis now with multiple falls rate controlled on Coreg. Eliquis held for possible endoscopy.  Thrombocytopenia: Platelet repeat CBC.  Blood pressure stable Recent Labs  Lab 12/21/20 0007 12/22/20 0754  PLT 140* 141*   Hypercalcemia: Calcium level is stable 9.9.  Hyperlipidemia on Crestor  MorBid Obesity BMI 34: Will benefit with weight loss PCP follow-up.  Diet Order            Diet clear liquid Room service appropriate? Yes; Fluid consistency: Thin; Fluid restriction: 1500 mL Fluid  Diet effective now               Patient's Body mass index is 34.38 kg/m.  DVT prophylaxis: SCD.  Anticoagulation on hold in the setting of possible GI bleed Code Status:   Code Status: Full Code  Family Communication: plan of care discussed with patient and his son at bedside.  Status is: Inpatient  Remains inpatient appropriate because:IV treatments appropriate due to intensity of illness or inability to take PO and Inpatient level of care appropriate due to severity of illness   Dispo: The patient is from: Home              Anticipated d/c is to: TBD              Patient currently is not medically stable to d/c.   Difficult to place patient No   Unresulted Labs (From admission, onward)          Start     Ordered   12/23/20 0500  CBC  Daily,   R     Question:  Specimen collection method  Answer:  Lab=Lab collect   12/22/20 0829   12/23/20 1610  Basic metabolic panel  Daily,   R     Question:  Specimen collection method  Answer:  Lab=Lab collect   12/22/20 0829   12/22/20 9604  Basic metabolic panel  Daily,   R      12/22/20 0645        Medications reviewed:   Scheduled Meds: . allopurinol  300 mg Oral Daily  . amLODipine  5 mg Oral Daily  . aspirin EC  81 mg Oral Daily  . carvedilol  25 mg Oral BID WC  . furosemide  80 mg Intravenous BID  . latanoprost  1 drop Both Eyes QHS  . lidocaine  1 patch Transdermal Q24H  . lidocaine  1 patch Transdermal Q24H  . pantoprazole  40 mg Oral Daily  . rosuvastatin  5 mg Oral Q M,W,F  . sodium chloride flush  3 mL Intravenous Q12H   Continuous Infusions: . sodium chloride      Consultants: Nephrology, cardiology GI   Procedures:see note  Antimicrobials: Anti-infectives (From admission, onward)   None     Culture/Microbiology No results found for: SDES, SPECREQUEST, CULT, REPTSTATUS  Other culture-see note  Objective: Vitals: Today's Vitals   12/21/20 2020 12/21/20 2026 12/21/20 2341 12/22/20 0358  BP: 117/71  131/71 138/81  Pulse: (!) 52  61 82  Resp:   20 20  Temp:    (!) 97.5 F (36.4 C)  TempSrc:    Oral  SpO2:   99% 99%  Weight:      Height:      PainSc:  4       Intake/Output Summary (Last 24 hours) at 12/22/2020 0941 Last data filed at 12/22/2020 0402 Gross per 24 hour  Intake --  Output 1000 ml  Net -1000 ml   Filed Weights   12/20/20 2325  Weight: 115 kg   Weight change:   Intake/Output from previous day: 04/06 0701 - 04/07 0700 In: -  Out: 1200 [Urine:1200] Intake/Output this shift: No intake/output data recorded. Filed Weights   12/20/20 2325  Weight: 115 kg    Examination: General exam: AAOX3, appears ill deconditioned frail and weak,NAD, weak appearing. HEENT:Oral mucosa moist, Ear/Nose WNL grossly,dentition normal. Respiratory system: bilaterally diminished breath sounds, chest wall tender to touch, no use of accessory muscle, non tender. Cardiovascular system: S1 & S2 +, regular, No JVD. Gastrointestinal system: Abdomen soft, NT,ND, BS+. Nervous System:Alert, awake, moving extremities and grossly nonfocal Extremities: Bilateral pitting edema, distal  peripheral pulses palpable.  Skin: No rashes,no icterus. MSK: Tender to palpation on ribs as well as shoulders and arms normal muscle bulk,tone, power  Data Reviewed: I have personally reviewed following labs and imaging studies CBC: Recent Labs  Lab 12/21/20 0007 12/22/20 0754  WBC 10.3 12.4*  NEUTROABS 8.3*  --   HGB 8.7* 9.2*  HCT 25.5* 27.1*  MCV 91.1 90.0  PLT 140* 211*   Basic Metabolic Panel: Recent Labs  Lab 12/21/20 0007 12/21/20 0200 12/21/20 0848 12/21/20 1921 12/22/20 0754  NA 122* 122* 121* 123* 126*  K 4.9 4.8 4.6 4.3 4.1  CL 92* 92* 91* 91* 94*  CO2 20* 18* 20* 23 20*  GLUCOSE 118* 107* 86 110* 96  BUN 80* 80* 79* 77* 80*  CREATININE 4.45* 4.32* 4.36* 4.43* 4.43*  CALCIUM 10.8* 10.5* 10.0 9.9 9.9  PHOS  --   --   --  5.9*  --    GFR: Estimated Creatinine Clearance: 16 mL/min (A) (by C-G formula based on SCr of 4.43 mg/dL (H)). Liver Function Tests: Recent Labs  Lab 12/21/20 0007 12/21/20 1921  AST 63*  --   ALT 41  --   ALKPHOS 84  --   BILITOT 1.3*  --   PROT 5.8*  --   ALBUMIN 3.2* 3.0*   Recent Labs  Lab 12/21/20 0007  LIPASE 41   No results for input(s): AMMONIA in the last 168 hours. Coagulation Profile: No results for input(s): INR, PROTIME in the last 168 hours. Cardiac Enzymes: Recent Labs  Lab 12/21/20 0848  CKTOTAL 480*   BNP (last 3 results) No results for input(s): PROBNP in the last 8760 hours. HbA1C: No results for input(s): HGBA1C in the last 72 hours. CBG: No results for input(s): GLUCAP in the last 168 hours. Lipid Profile: No results for input(s): CHOL, HDL, LDLCALC, TRIG, CHOLHDL, LDLDIRECT in the last 72 hours. Thyroid Function Tests: Recent Labs    12/21/20 0200  TSH 2.577   Anemia Panel: Recent Labs    12/21/20 0200  VITAMINB12 1,156*  FOLATE 8.1  TIBC 244*  IRON 37*   Sepsis Labs: No results for input(s): PROCALCITON, LATICACIDVEN in the  last 168 hours.  Recent Results (from the past 240  hour(s))  Resp Panel by RT-PCR (Flu A&B, Covid) Nasopharyngeal Swab     Status: None   Collection Time: 12/21/20  2:45 AM   Specimen: Nasopharyngeal Swab; Nasopharyngeal(NP) swabs in vial transport medium  Result Value Ref Range Status   SARS Coronavirus 2 by RT PCR NEGATIVE NEGATIVE Final    Comment: (NOTE) SARS-CoV-2 target nucleic acids are NOT DETECTED.  The SARS-CoV-2 RNA is generally detectable in upper respiratory specimens during the acute phase of infection. The lowest concentration of SARS-CoV-2 viral copies this assay can detect is 138 copies/mL. A negative result does not preclude SARS-Cov-2 infection and should not be used as the sole basis for treatment or other patient management decisions. A negative result may occur with  improper specimen collection/handling, submission of specimen other than nasopharyngeal swab, presence of viral mutation(s) within the areas targeted by this assay, and inadequate number of viral copies(<138 copies/mL). A negative result must be combined with clinical observations, patient history, and epidemiological information. The expected result is Negative.  Fact Sheet for Patients:  EntrepreneurPulse.com.au  Fact Sheet for Healthcare Providers:  IncredibleEmployment.be  This test is no t yet approved or cleared by the [place] FDA and  has been authorized for detection and/or diagnosis of SARS-CoV-2 by FDA under an Emergency Use Authorization (EUA). This EUA will remain  in effect (meaning this test can be used) for the duration of the COVID-19 declaration under Section 564(b)(1) of the Act, 21 U.S.C.section 360bbb-3(b)(1), unless the authorization is terminated  or revoked sooner.       Influenza A by PCR NEGATIVE NEGATIVE Final   Influenza B by PCR NEGATIVE NEGATIVE Final    Comment: (NOTE) The Xpert Xpress SARS-CoV-2/FLU/RSV plus assay is intended as an aid in the diagnosis of influenza from  Nasopharyngeal swab specimens and should not be used as a sole basis for treatment. Nasal washings and aspirates are unacceptable for Xpert Xpress SARS-CoV-2/FLU/RSV testing.  Fact Sheet for Patients: EntrepreneurPulse.com.au  Fact Sheet for Healthcare Providers: IncredibleEmployment.be  This test is not yet approved or cleared by the [place] FDA and has been authorized for detection and/or diagnosis of SARS-CoV-2 by FDA under an Emergency Use Authorization (EUA). This EUA will remain in effect (meaning this test can be used) for the duration of the COVID-19 declaration under Section 564(b)(1) of the Act, 21 U.S.C. section 360bbb-3(b)(1), unless the authorization is terminated or revoked.  Performed at [facility], [place] [address], [place], [place] [address]      Radiology Studies: CT Head Wo Contrast  Result Date: 12/21/2020 CLINICAL DATA:  Fall EXAM: CT HEAD WITHOUT CONTRAST TECHNIQUE: Contiguous axial images were obtained from the base of the skull through the vertex without intravenous contrast. COMPARISON:  None. FINDINGS: Brain: There is atrophy and chronic small vessel disease changes. No acute intracranial abnormality. Specifically, no hemorrhage, hydrocephalus, mass lesion, acute infarction, or significant intracranial injury. Vascular: No hyperdense vessel or unexpected calcification. Skull: No acute calvarial abnormality. Sinuses/Orbits: No acute findings Other: None IMPRESSION: Atrophy, chronic microvascular disease. No acute intracranial abnormality. Electronically Signed   By: Rolm Baptise M.D.   On: 12/21/2020 01:47   CT Cervical Spine Wo Contrast  Result Date: 12/21/2020 CLINICAL DATA:  Fall EXAM: CT CERVICAL SPINE WITHOUT CONTRAST TECHNIQUE: Multidetector CT imaging of the cervical spine was performed without intravenous contrast. Multiplanar CT image reconstructions were also generated. COMPARISON:  None. FINDINGS:  Alignment: Slight anterolisthesis of  C4 on C5 related to facet disease. Skull base and vertebrae: No acute fracture. No primary bone lesion or focal pathologic process. Soft tissues and spinal canal: No prevertebral fluid or swelling. No visible canal hematoma. Disc levels: Diffuse degenerative disc disease, most pronounced in the lower lumbar spine. Diffuse degenerative facet disease bilaterally. Upper chest: Right pleural effusion partially imaged. Other: None IMPRESSION: Degenerative changes.  No acute bony abnormality. Right pleural effusion. Electronically Signed   By: Rolm Baptise M.D.   On: 12/21/2020 01:49   CT T-SPINE NO CHARGE  Result Date: 12/21/2020 CLINICAL DATA:  Acute pain due to trauma. Bilateral shoulder pain. Possible swallowed foreign body. EXAM: CT CHEST, ABDOMEN AND PELVIS WITHOUT CONTRAST CT THORACIC AND LUMBAR SPINE WITHOUT CONTRAST TECHNIQUE: Multidetector CT imaging of the chest, abdomen and pelvis was performed following the standard protocol without IV contrast. Multiplanar CT images of the thoracic and lumbar spine were reconstructed from contemporary CT of the Chest, Abdomen, and Pelvis COMPARISON:  None. FINDINGS: CT CHEST FINDINGS Cardiovascular: Atherosclerotic changes are noted of the thoracic aorta. There is an ascending thoracic aortic aneurysm measuring approximately 4.2 cm. The heart size is enlarged. Coronary artery calcifications are noted. There is a trace pericardial effusion. The intracardiac blood pool is hypodense relative to the adjacent myocardium consistent with anemia. Mediastinum/Nodes: -- No mediastinal lymphadenopathy. -- No hilar lymphadenopathy. -- No axillary lymphadenopathy. -- No supraclavicular lymphadenopathy. -- Normal thyroid gland where visualized. -  Unremarkable esophagus. Lungs/Pleura: There is a moderate-sized right-sided pleural effusion. There is a small left-sided pleural effusion. There are prominent interstitial lung markings bilaterally. No  pneumothorax. There are few scattered ground-glass airspace opacities bilaterally. There is a 5 mm pulmonary nodule in the left upper lobe (axial series 6, image 58). Musculoskeletal: There are acute to subacute appearing anterior rib fractures on the right involving the third and fourth ribs. There appears to be a nondisplaced acute fracture involving the anterior second rib on the left. There are old healed anterior left-sided rib fractures. There are healing fractures involving the left seventh eighth ribs anteriorly. CT ABDOMEN PELVIS FINDINGS Hepatobiliary: Hepatic cysts are noted. Normal gallbladder.There is no biliary ductal dilation. Pancreas: Normal contours without ductal dilatation. No peripancreatic fluid collection. Spleen: Unremarkable. Adrenals/Urinary Tract: --Adrenal glands: Unremarkable. --Right kidney/ureter: Multiple complex and simple appearing cysts are noted involving the right kidney. --Left kidney/ureter: Multiple complex and simple appearing cysts are noted on the patient's left. There is an indeterminate exophytic hyperdense nodule rising from the interpolar region of the left kidney measuring approximately 2.5 cm. This nodule contains multiple calcifications (axial series 3, image 72). --Urinary bladder: Unremarkable. Stomach/Bowel: --Stomach/Duodenum: There is a metallic foreign body in the gastric body measuring approximately 2.1 cm (axial series 3, image 68). --Small bowel: There appears to be mild wall thickening and possible adjacent fat stranding involving the proximal duodenum. --Colon: Rectosigmoid diverticulosis without acute inflammation. --Appendix: Normal. Vascular/Lymphatic: Atherosclerotic calcification is present within the non-aneurysmal abdominal aorta, without hemodynamically significant stenosis. --No retroperitoneal lymphadenopathy. --No mesenteric lymphadenopathy. --No pelvic or inguinal lymphadenopathy. Reproductive: Brachytherapy beads are noted Other: There is  nonspecific retroperitoneal fluid along the right psoas muscle. There is a fat containing umbilical hernia. Musculoskeletal. No acute displaced fractures. IMPRESSION: 1. There are bilateral nondisplaced anterior rib fractures without evidence for pneumothorax. 2. There is a metallic foreign body in the gastric body measuring approximately 2.1 cm. This may represent the reported swallowed hearing aid. 3. There appears to be mild wall thickening and possible adjacent fat stranding involving  the proximal duodenum. This may represent duodenitis/peptic ulcer disease versus pancreatitis. There is a small volume of retroperitoneal free fluid is felt to be related. 4. There is a moderate-sized right-sided pleural effusion and a small left-sided pleural effusion. 5. There is a 5 mm pulmonary nodule in the left upper lobe. No follow-up needed if patient is low-risk. Non-contrast chest CT can be considered in 12 months if patient is high-risk. This recommendation follows the consensus statement: Guidelines for Management of Incidental Pulmonary Nodules Detected on CT Images: From the Fleischner Society 2017; Radiology 2017; 284:228-243. 6. There are indeterminate bilateral renal nodules favored to represent hemorrhagic or proteinaceous cysts. Follow-up with a nonemergent outpatient renal ultrasound is recommended. 7. Cardiomegaly and coronary artery calcifications. There are findings suggestive of volume overload and developing pulmonary edema. 8. Anemia. 9. Ascending thoracic aortic aneurysm measuring 4.2 cm. Recommend annual imaging followup by CTA or MRA. This recommendation follows 2010 ACCF/AHA/AATS/ACR/ASA/SCA/SCAI/SIR/STS/SVM Guidelines for the Diagnosis and Management of Patients with Thoracic Aortic Disease. Circulation. 2010; 121: J856-D149. Aortic aneurysm NOS (ICD10-I71.9) 10. No acute fracture involving the thoracic or lumbar spine. Aortic Atherosclerosis (ICD10-I70.0). Electronically Signed   By: Constance Holster M.D.   On: 12/21/2020 02:31   CT L-SPINE NO CHARGE  Result Date: 12/21/2020 CLINICAL DATA:  Acute pain due to trauma. Bilateral shoulder pain. Possible swallowed foreign body. EXAM: CT CHEST, ABDOMEN AND PELVIS WITHOUT CONTRAST CT THORACIC AND LUMBAR SPINE WITHOUT CONTRAST TECHNIQUE: Multidetector CT imaging of the chest, abdomen and pelvis was performed following the standard protocol without IV contrast. Multiplanar CT images of the thoracic and lumbar spine were reconstructed from contemporary CT of the Chest, Abdomen, and Pelvis COMPARISON:  None. FINDINGS: CT CHEST FINDINGS Cardiovascular: Atherosclerotic changes are noted of the thoracic aorta. There is an ascending thoracic aortic aneurysm measuring approximately 4.2 cm. The heart size is enlarged. Coronary artery calcifications are noted. There is a trace pericardial effusion. The intracardiac blood pool is hypodense relative to the adjacent myocardium consistent with anemia. Mediastinum/Nodes: -- No mediastinal lymphadenopathy. -- No hilar lymphadenopathy. -- No axillary lymphadenopathy. -- No supraclavicular lymphadenopathy. -- Normal thyroid gland where visualized. -  Unremarkable esophagus. Lungs/Pleura: There is a moderate-sized right-sided pleural effusion. There is a small left-sided pleural effusion. There are prominent interstitial lung markings bilaterally. No pneumothorax. There are few scattered ground-glass airspace opacities bilaterally. There is a 5 mm pulmonary nodule in the left upper lobe (axial series 6, image 58). Musculoskeletal: There are acute to subacute appearing anterior rib fractures on the right involving the third and fourth ribs. There appears to be a nondisplaced acute fracture involving the anterior second rib on the left. There are old healed anterior left-sided rib fractures. There are healing fractures involving the left seventh eighth ribs anteriorly. CT ABDOMEN PELVIS FINDINGS Hepatobiliary: Hepatic cysts are  noted. Normal gallbladder.There is no biliary ductal dilation. Pancreas: Normal contours without ductal dilatation. No peripancreatic fluid collection. Spleen: Unremarkable. Adrenals/Urinary Tract: --Adrenal glands: Unremarkable. --Right kidney/ureter: Multiple complex and simple appearing cysts are noted involving the right kidney. --Left kidney/ureter: Multiple complex and simple appearing cysts are noted on the patient's left. There is an indeterminate exophytic hyperdense nodule rising from the interpolar region of the left kidney measuring approximately 2.5 cm. This nodule contains multiple calcifications (axial series 3, image 72). --Urinary bladder: Unremarkable. Stomach/Bowel: --Stomach/Duodenum: There is a metallic foreign body in the gastric body measuring approximately 2.1 cm (axial series 3, image 68). --Small bowel: There appears to be mild wall thickening and  possible adjacent fat stranding involving the proximal duodenum. --Colon: Rectosigmoid diverticulosis without acute inflammation. --Appendix: Normal. Vascular/Lymphatic: Atherosclerotic calcification is present within the non-aneurysmal abdominal aorta, without hemodynamically significant stenosis. --No retroperitoneal lymphadenopathy. --No mesenteric lymphadenopathy. --No pelvic or inguinal lymphadenopathy. Reproductive: Brachytherapy beads are noted Other: There is nonspecific retroperitoneal fluid along the right psoas muscle. There is a fat containing umbilical hernia. Musculoskeletal. No acute displaced fractures. IMPRESSION: 1. There are bilateral nondisplaced anterior rib fractures without evidence for pneumothorax. 2. There is a metallic foreign body in the gastric body measuring approximately 2.1 cm. This may represent the reported swallowed hearing aid. 3. There appears to be mild wall thickening and possible adjacent fat stranding involving the proximal duodenum. This may represent duodenitis/peptic ulcer disease versus pancreatitis.  There is a small volume of retroperitoneal free fluid is felt to be related. 4. There is a moderate-sized right-sided pleural effusion and a small left-sided pleural effusion. 5. There is a 5 mm pulmonary nodule in the left upper lobe. No follow-up needed if patient is low-risk. Non-contrast chest CT can be considered in 12 months if patient is high-risk. This recommendation follows the consensus statement: Guidelines for Management of Incidental Pulmonary Nodules Detected on CT Images: From the Fleischner Society 2017; Radiology 2017; 284:228-243. 6. There are indeterminate bilateral renal nodules favored to represent hemorrhagic or proteinaceous cysts. Follow-up with a nonemergent outpatient renal ultrasound is recommended. 7. Cardiomegaly and coronary artery calcifications. There are findings suggestive of volume overload and developing pulmonary edema. 8. Anemia. 9. Ascending thoracic aortic aneurysm measuring 4.2 cm. Recommend annual imaging followup by CTA or MRA. This recommendation follows 2010 ACCF/AHA/AATS/ACR/ASA/SCA/SCAI/SIR/STS/SVM Guidelines for the Diagnosis and Management of Patients with Thoracic Aortic Disease. Circulation. 2010; 121: O378-H885. Aortic aneurysm NOS (ICD10-I71.9) 10. No acute fracture involving the thoracic or lumbar spine. Aortic Atherosclerosis (ICD10-I70.0). Electronically Signed   By: Constance Holster M.D.   On: 12/21/2020 02:31   CT CHEST ABDOMEN PELVIS WO CONTRAST  Result Date: 12/21/2020 CLINICAL DATA:  Acute pain due to trauma. Bilateral shoulder pain. Possible swallowed foreign body. EXAM: CT CHEST, ABDOMEN AND PELVIS WITHOUT CONTRAST CT THORACIC AND LUMBAR SPINE WITHOUT CONTRAST TECHNIQUE: Multidetector CT imaging of the chest, abdomen and pelvis was performed following the standard protocol without IV contrast. Multiplanar CT images of the thoracic and lumbar spine were reconstructed from contemporary CT of the Chest, Abdomen, and Pelvis COMPARISON:  None. FINDINGS:  CT CHEST FINDINGS Cardiovascular: Atherosclerotic changes are noted of the thoracic aorta. There is an ascending thoracic aortic aneurysm measuring approximately 4.2 cm. The heart size is enlarged. Coronary artery calcifications are noted. There is a trace pericardial effusion. The intracardiac blood pool is hypodense relative to the adjacent myocardium consistent with anemia. Mediastinum/Nodes: -- No mediastinal lymphadenopathy. -- No hilar lymphadenopathy. -- No axillary lymphadenopathy. -- No supraclavicular lymphadenopathy. -- Normal thyroid gland where visualized. -  Unremarkable esophagus. Lungs/Pleura: There is a moderate-sized right-sided pleural effusion. There is a small left-sided pleural effusion. There are prominent interstitial lung markings bilaterally. No pneumothorax. There are few scattered ground-glass airspace opacities bilaterally. There is a 5 mm pulmonary nodule in the left upper lobe (axial series 6, image 58). Musculoskeletal: There are acute to subacute appearing anterior rib fractures on the right involving the third and fourth ribs. There appears to be a nondisplaced acute fracture involving the anterior second rib on the left. There are old healed anterior left-sided rib fractures. There are healing fractures involving the left seventh eighth ribs anteriorly. CT ABDOMEN PELVIS FINDINGS  Hepatobiliary: Hepatic cysts are noted. Normal gallbladder.There is no biliary ductal dilation. Pancreas: Normal contours without ductal dilatation. No peripancreatic fluid collection. Spleen: Unremarkable. Adrenals/Urinary Tract: --Adrenal glands: Unremarkable. --Right kidney/ureter: Multiple complex and simple appearing cysts are noted involving the right kidney. --Left kidney/ureter: Multiple complex and simple appearing cysts are noted on the patient's left. There is an indeterminate exophytic hyperdense nodule rising from the interpolar region of the left kidney measuring approximately 2.5 cm. This  nodule contains multiple calcifications (axial series 3, image 72). --Urinary bladder: Unremarkable. Stomach/Bowel: --Stomach/Duodenum: There is a metallic foreign body in the gastric body measuring approximately 2.1 cm (axial series 3, image 68). --Small bowel: There appears to be mild wall thickening and possible adjacent fat stranding involving the proximal duodenum. --Colon: Rectosigmoid diverticulosis without acute inflammation. --Appendix: Normal. Vascular/Lymphatic: Atherosclerotic calcification is present within the non-aneurysmal abdominal aorta, without hemodynamically significant stenosis. --No retroperitoneal lymphadenopathy. --No mesenteric lymphadenopathy. --No pelvic or inguinal lymphadenopathy. Reproductive: Brachytherapy beads are noted Other: There is nonspecific retroperitoneal fluid along the right psoas muscle. There is a fat containing umbilical hernia. Musculoskeletal. No acute displaced fractures. IMPRESSION: 1. There are bilateral nondisplaced anterior rib fractures without evidence for pneumothorax. 2. There is a metallic foreign body in the gastric body measuring approximately 2.1 cm. This may represent the reported swallowed hearing aid. 3. There appears to be mild wall thickening and possible adjacent fat stranding involving the proximal duodenum. This may represent duodenitis/peptic ulcer disease versus pancreatitis. There is a small volume of retroperitoneal free fluid is felt to be related. 4. There is a moderate-sized right-sided pleural effusion and a small left-sided pleural effusion. 5. There is a 5 mm pulmonary nodule in the left upper lobe. No follow-up needed if patient is low-risk. Non-contrast chest CT can be considered in 12 months if patient is high-risk. This recommendation follows the consensus statement: Guidelines for Management of Incidental Pulmonary Nodules Detected on CT Images: From the Fleischner Society 2017; Radiology 2017; 284:228-243. 6. There are  indeterminate bilateral renal nodules favored to represent hemorrhagic or proteinaceous cysts. Follow-up with a nonemergent outpatient renal ultrasound is recommended. 7. Cardiomegaly and coronary artery calcifications. There are findings suggestive of volume overload and developing pulmonary edema. 8. Anemia. 9. Ascending thoracic aortic aneurysm measuring 4.2 cm. Recommend annual imaging followup by CTA or MRA. This recommendation follows 2010 ACCF/AHA/AATS/ACR/ASA/SCA/SCAI/SIR/STS/SVM Guidelines for the Diagnosis and Management of Patients with Thoracic Aortic Disease. Circulation. 2010; 121: C588-F027. Aortic aneurysm NOS (ICD10-I71.9) 10. No acute fracture involving the thoracic or lumbar spine. Aortic Atherosclerosis (ICD10-I70.0). Electronically Signed   By: Constance Holster M.D.   On: 12/21/2020 02:31     LOS: 1 day   Antonieta Pert, MD [facility]  12/22/2020, 9:41 AM

## 2020-12-23 ENCOUNTER — Inpatient Hospital Stay (HOSPITAL_COMMUNITY): Payer: Medicare Other | Admitting: Anesthesiology

## 2020-12-23 ENCOUNTER — Encounter (HOSPITAL_COMMUNITY): Payer: Self-pay | Admitting: Internal Medicine

## 2020-12-23 ENCOUNTER — Inpatient Hospital Stay (HOSPITAL_COMMUNITY): Payer: Medicare Other

## 2020-12-23 ENCOUNTER — Encounter (HOSPITAL_COMMUNITY)
Admission: EM | Disposition: A | Discharge status: Skilled Nursing Facility | Payer: Self-pay | Source: Home / Self Care | Attending: Internal Medicine

## 2020-12-23 DIAGNOSIS — N179 Acute kidney failure, unspecified: Secondary | ICD-10-CM | POA: Diagnosis not present

## 2020-12-23 DIAGNOSIS — I4819 Other persistent atrial fibrillation: Secondary | ICD-10-CM | POA: Diagnosis not present

## 2020-12-23 DIAGNOSIS — E871 Hypo-osmolality and hyponatremia: Secondary | ICD-10-CM | POA: Diagnosis not present

## 2020-12-23 DIAGNOSIS — S2243XA Multiple fractures of ribs, bilateral, initial encounter for closed fracture: Secondary | ICD-10-CM | POA: Diagnosis not present

## 2020-12-23 DIAGNOSIS — I509 Heart failure, unspecified: Secondary | ICD-10-CM | POA: Diagnosis not present

## 2020-12-23 HISTORY — PX: ESOPHAGOGASTRODUODENOSCOPY: SHX5428

## 2020-12-23 LAB — CBC
HCT: 25 % — ABNORMAL LOW (ref 39.0–52.0)
Hemoglobin: 8.6 g/dL — ABNORMAL LOW (ref 13.0–17.0)
MCH: 30.5 pg (ref 26.0–34.0)
MCHC: 34.4 g/dL (ref 30.0–36.0)
MCV: 88.7 fL (ref 80.0–100.0)
Platelets: 135 10*3/uL — ABNORMAL LOW (ref 150–400)
RBC: 2.82 MIL/uL — ABNORMAL LOW (ref 4.22–5.81)
RDW: 14.7 % (ref 11.5–15.5)
WBC: 9.1 10*3/uL (ref 4.0–10.5)
nRBC: 0 % (ref 0.0–0.2)

## 2020-12-23 LAB — BASIC METABOLIC PANEL
Anion gap: 11 (ref 5–15)
BUN: 80 mg/dL — ABNORMAL HIGH (ref 8–23)
CO2: 22 mmol/L (ref 22–32)
Calcium: 9.5 mg/dL (ref 8.9–10.3)
Chloride: 92 mmol/L — ABNORMAL LOW (ref 98–111)
Creatinine, Ser: 4.35 mg/dL — ABNORMAL HIGH (ref 0.61–1.24)
GFR, Estimated: 13 mL/min — ABNORMAL LOW (ref >60)
Glucose, Bld: 98 mg/dL (ref 70–99)
Potassium: 3.7 mmol/L (ref 3.5–5.1)
Sodium: 125 mmol/L — ABNORMAL LOW (ref 135–145)

## 2020-12-23 IMAGING — DX DG ABD PORTABLE 1V
2 series · 2 of 2 positions shown · non-contrast
Comparison: [DATE]; correlation CT chest abdomen [DATE]

CLINICAL DATA: Gastric foreign body, may have swallowed hearing aid

EXAM:
PORTABLE ABDOMEN - 1 VIEW

[abdomen kub (1 of 2)]
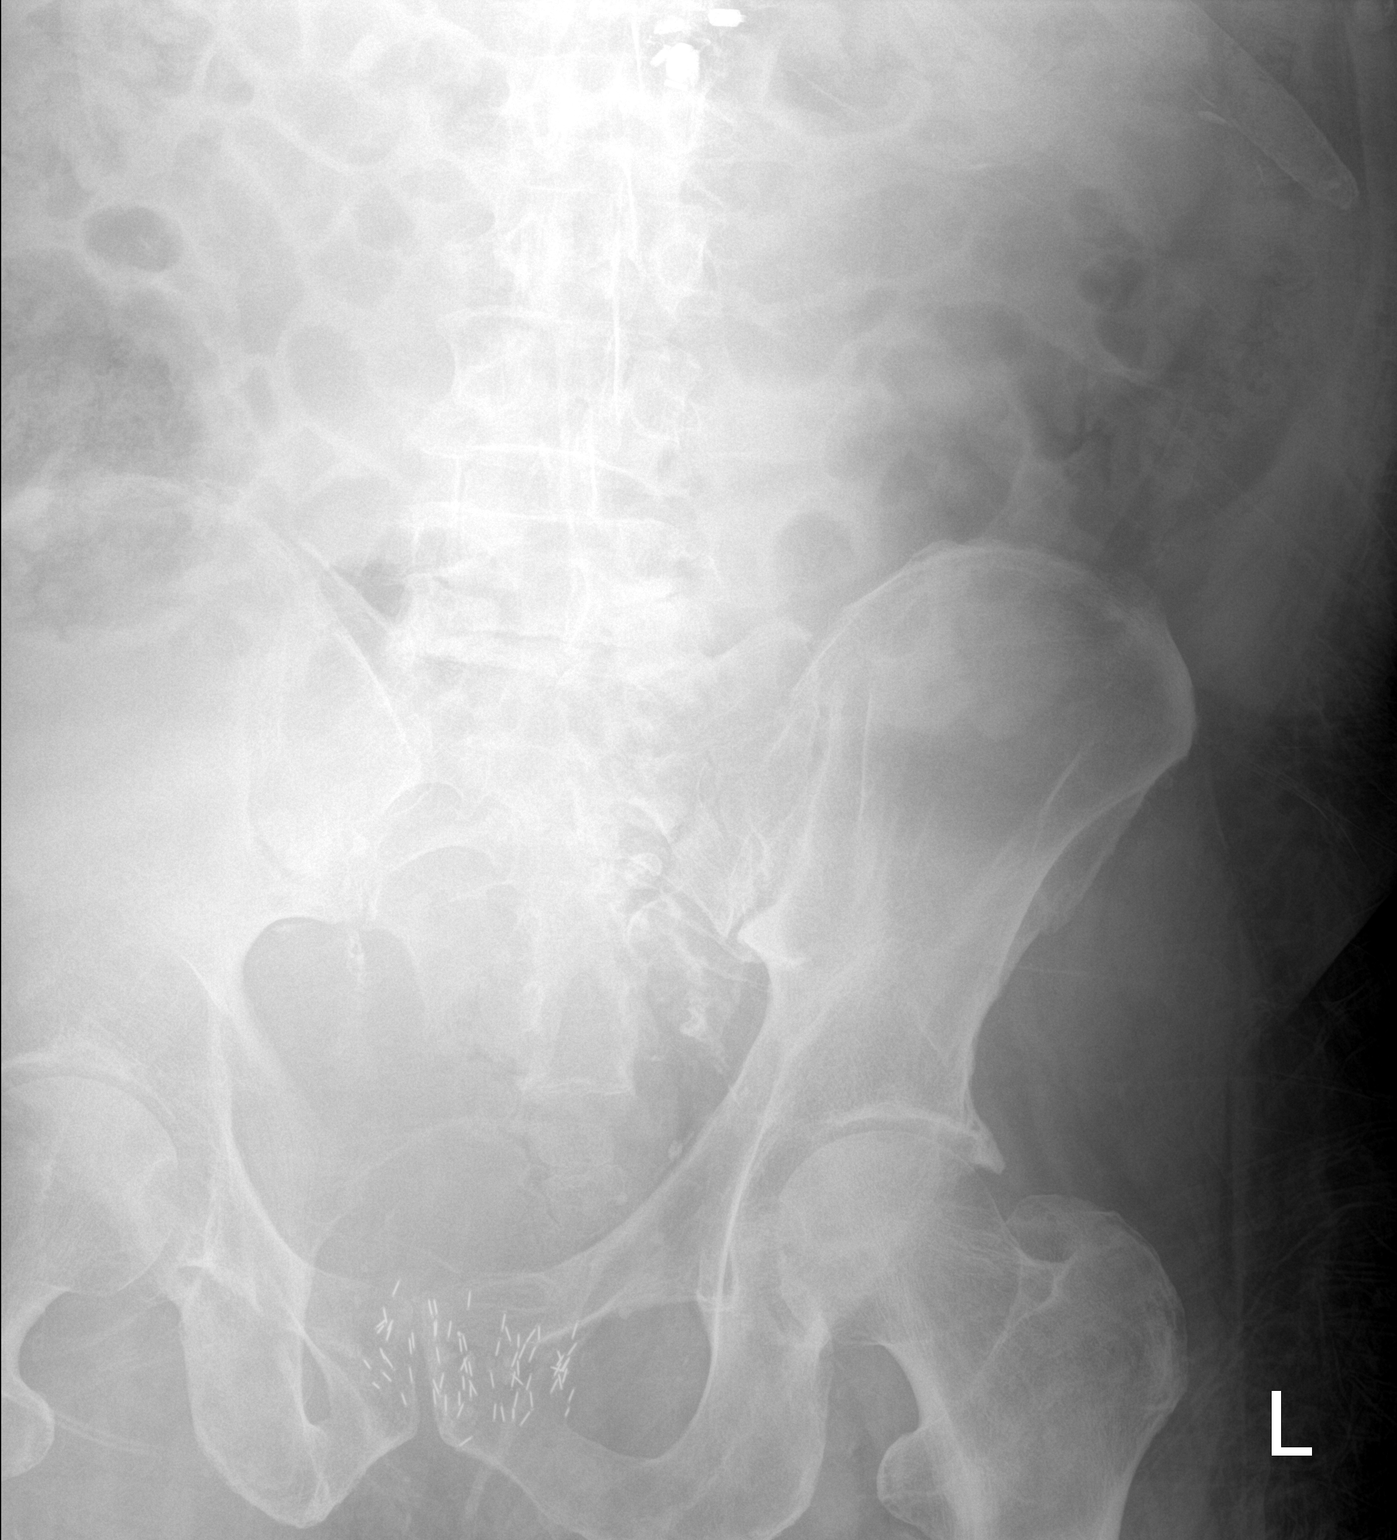

[abdomen kub (2 of 2)]
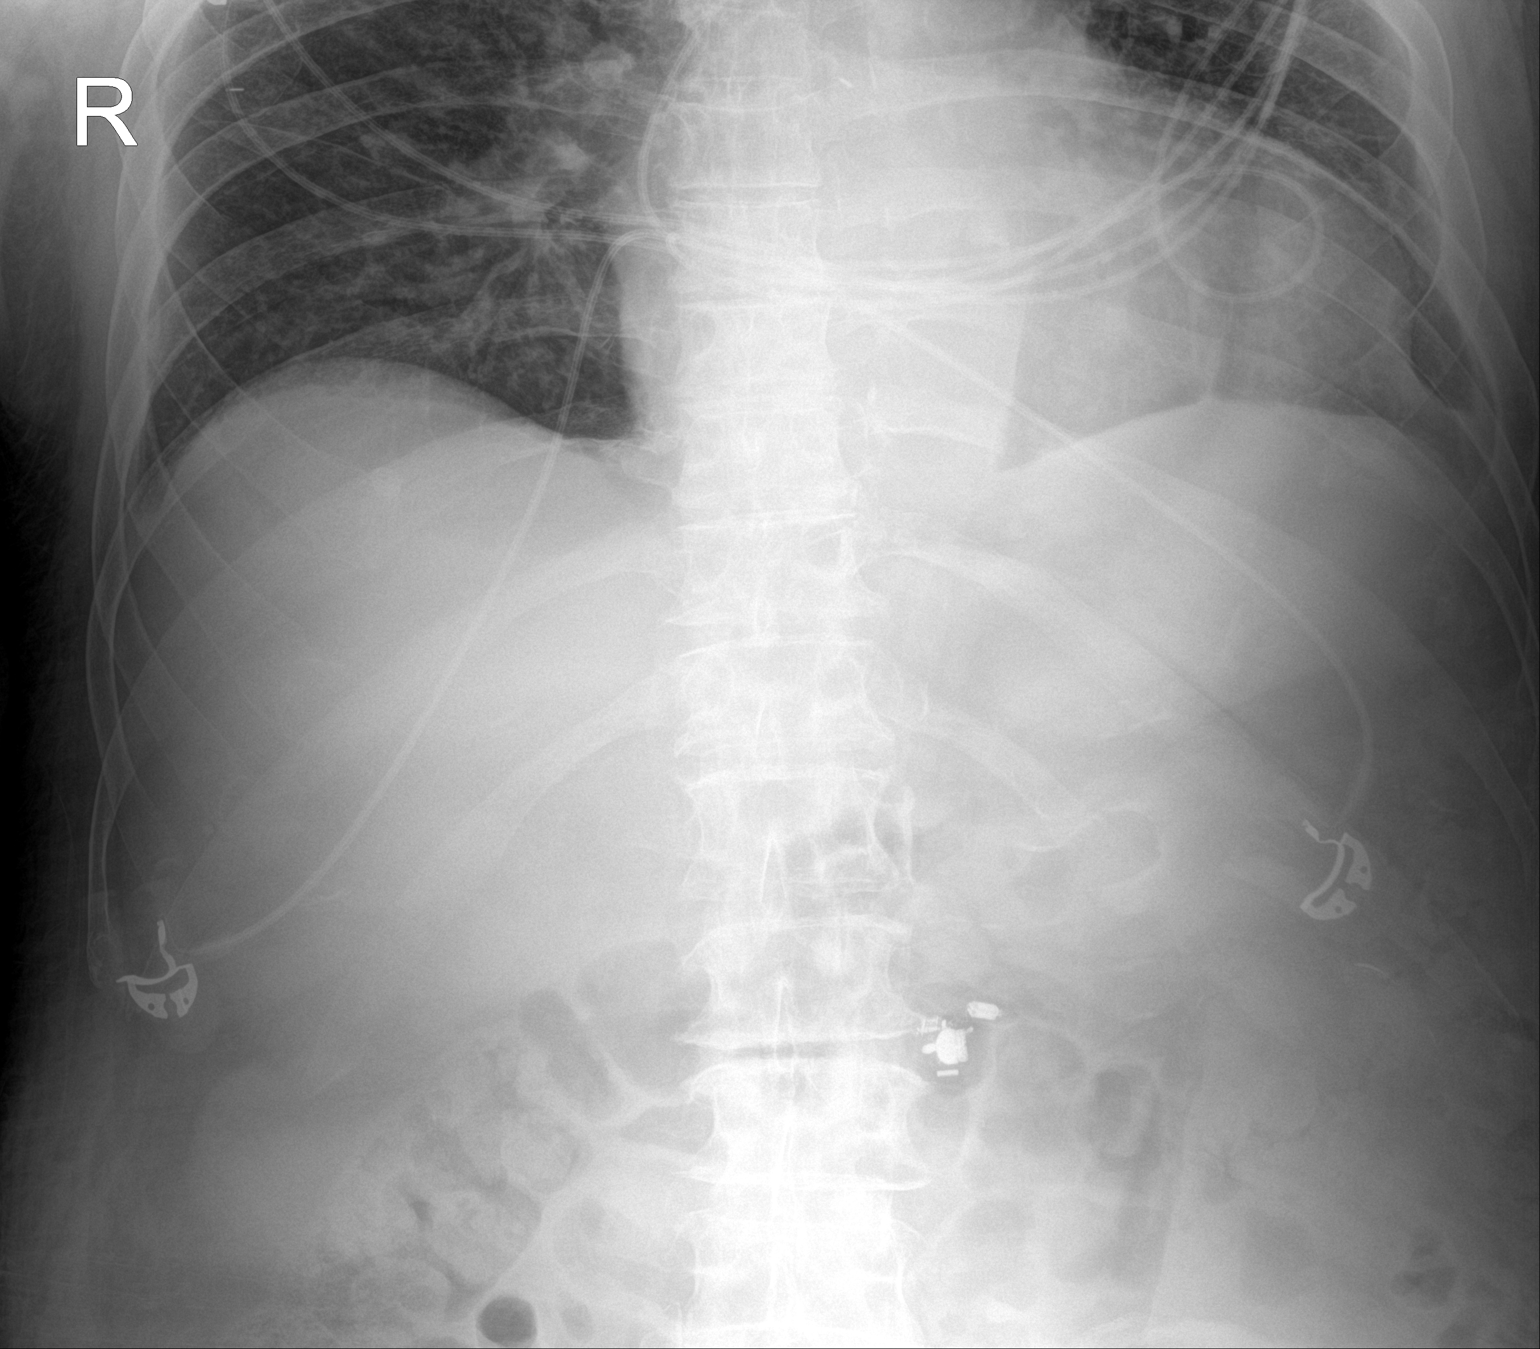

[2 of 2 positions shown; findings below may reference images not displayed]

FINDINGS: Electronic components of a foreign body again identified LEFT
paraspinal at L2-L3, unchanged.

This projects over gastric antrum and transverse colon.

Bowel gas pattern normal.

Brachytherapy seed implants at prostate bed.

Bones demineralized with degenerative changes lumbar spine.

No urinary tract calcification.

Atherosclerotic calcifications in the iliac systems.
IMPRESSION: Persistent electronic foreign body LEFT paraspinal at L2-3
consistent with ingested hearing aid.

This projects over the gastric antrum and the transverse colon and
could be within either, though the lack of interval change in
position since the previous study suggests gastric retention.

## 2020-12-23 SURGERY — EGD (ESOPHAGOGASTRODUODENOSCOPY)
Anesthesia: General

## 2020-12-23 MED ORDER — LIDOCAINE 2% (20 MG/ML) 5 ML SYRINGE
INTRAMUSCULAR | Status: DC | PRN
  Administered 2020-12-23: 100 mg via INTRAVENOUS

## 2020-12-23 MED ORDER — LACTATED RINGERS IV SOLN: INTRAVENOUS | Status: DC | PRN

## 2020-12-23 MED ORDER — FENTANYL CITRATE (PF) 250 MCG/5ML IJ SOLN
INTRAMUSCULAR | Status: DC | PRN
  Administered 2020-12-23: 50 ug via INTRAVENOUS

## 2020-12-23 MED ORDER — FENTANYL CITRATE (PF) 100 MCG/2ML IJ SOLN
INTRAMUSCULAR | Status: AC
  Filled 2020-12-23: qty 2

## 2020-12-23 MED ORDER — ONDANSETRON HCL 4 MG/2ML IJ SOLN
INTRAMUSCULAR | Status: DC | PRN
  Administered 2020-12-23: 4 mg via INTRAVENOUS

## 2020-12-23 MED ORDER — PHENYLEPHRINE 40 MCG/ML (10ML) SYRINGE FOR IV PUSH (FOR BLOOD PRESSURE SUPPORT)
PREFILLED_SYRINGE | INTRAVENOUS | Status: DC | PRN
  Administered 2020-12-23: 120 ug via INTRAVENOUS
  Administered 2020-12-23: 160 ug via INTRAVENOUS

## 2020-12-23 MED ORDER — SUCCINYLCHOLINE CHLORIDE 200 MG/10ML IV SOSY
PREFILLED_SYRINGE | INTRAVENOUS | Status: DC | PRN
  Administered 2020-12-23: 100 mg via INTRAVENOUS

## 2020-12-23 MED ORDER — SODIUM CHLORIDE 0.9 % IV SOLN: INTRAVENOUS | Status: DC

## 2020-12-23 MED ORDER — PROPOFOL 10 MG/ML IV BOLUS
INTRAVENOUS | Status: DC | PRN
  Administered 2020-12-23: 100 mg via INTRAVENOUS

## 2020-12-23 MED ORDER — DEXAMETHASONE SODIUM PHOSPHATE 10 MG/ML IJ SOLN
INTRAMUSCULAR | Status: DC | PRN
  Administered 2020-12-23: 10 mg via INTRAVENOUS

## 2020-12-23 MED ORDER — TRAMADOL HCL 50 MG PO TABS
25.0000 mg | ORAL_TABLET | Freq: Two times a day (BID) | ORAL | Status: DC | PRN
  Administered 2020-12-23 – 2020-12-26 (×2): 25 mg via ORAL
  Filled 2020-12-23 (×3): qty 1

## 2020-12-23 MED ORDER — EPHEDRINE SULFATE-NACL 50-0.9 MG/10ML-% IV SOSY
PREFILLED_SYRINGE | INTRAVENOUS | Status: DC | PRN
  Administered 2020-12-23: 15 mg via INTRAVENOUS
  Administered 2020-12-23 (×2): 20 mg via INTRAVENOUS

## 2020-12-23 MED ORDER — PANTOPRAZOLE SODIUM 40 MG PO TBEC
40.0000 mg | DELAYED_RELEASE_TABLET | Freq: Two times a day (BID) | ORAL | Status: DC
  Administered 2020-12-23 – 2021-01-04 (×25): 40 mg via ORAL
  Filled 2020-12-23 (×25): qty 1

## 2020-12-23 NOTE — Plan of Care (Signed)
  Problem: Health Behavior/Discharge Planning: Goal: Ability to manage health-related needs will improve Outcome: Progressing   Problem: Clinical Measurements: Goal: Will remain free from infection Outcome: Progressing   

## 2020-12-23 NOTE — Progress Notes (Signed)
Cardiology Progress Note  Patient ID: [name] [name] MRN: [id number] DOB: 06/21/1933 Date of Encounter: 12/23/2020  Primary Cardiologist: Evalina Field, MD  Subjective   Chief Complaint: None.  HPI: Kidney function improving.  Good diuresis.  Echo normal.  Plans for EGD today.  ROS:  All other ROS reviewed and negative. Pertinent positives noted in the HPI.     Inpatient Medications  Scheduled Meds: . allopurinol  100 mg Oral Daily  . amLODipine  5 mg Oral Daily  . aspirin EC  81 mg Oral Daily  . carvedilol  25 mg Oral BID WC  . furosemide  80 mg Intravenous BID  . latanoprost  1 drop Both Eyes QHS  . lidocaine  1 patch Transdermal Q24H  . lidocaine  1 patch Transdermal Q24H  . pantoprazole  40 mg Oral Daily  . rosuvastatin  5 mg Oral Q M,W,F  . sodium chloride flush  3 mL Intravenous Q12H   Continuous Infusions: . sodium chloride     PRN Meds: sodium chloride, acetaminophen, melatonin, ondansetron (ZOFRAN) IV, sodium chloride flush, traMADol   Vital Signs   Vitals:   12/22/20 0955 12/22/20 1758 12/22/20 2033 12/23/20 0456  BP: 121/69 (!) 113/59 128/71 131/68  Pulse: 61 (!) 51 (!) 46 (!) 57  Resp: 18 18 20 20   Temp: 98.3 F (36.8 C) 98.3 F (36.8 C)  98.2 F (36.8 C)  TempSrc:    Oral  SpO2: 99% 93% 98% 97%  Weight:      Height:        Intake/Output Summary (Last 24 hours) at 12/23/2020 1107 Last data filed at 12/23/2020 0800 Gross per 24 hour  Intake 800 ml  Output 3200 ml  Net -2400 ml   Last 3 Weights 12/20/2020 11/28/2011 11/27/2011  Weight (lbs) 253 lb 8.5 oz 225 lb 12 oz 231 lb 4.2 oz  Weight (kg) 115 kg 102.4 kg 104.9 kg      Telemetry  Overnight telemetry shows atrial fibrillation with heart rate in the 60s, which I personally reviewed.   Physical Exam   Vitals:   12/22/20 0955 12/22/20 1758 12/22/20 2033 12/23/20 0456  BP: 121/69 (!) 113/59 128/71 131/68  Pulse: 61 (!) 51 (!) 46 (!) 57  Resp: 18 18 20 20   Temp: 98.3 F (36.8 C) 98.3 F  (36.8 C)  98.2 F (36.8 C)  TempSrc:    Oral  SpO2: 99% 93% 98% 97%  Weight:      Height:         Intake/Output Summary (Last 24 hours) at 12/23/2020 1107 Last data filed at 12/23/2020 0800 Gross per 24 hour  Intake 800 ml  Output 3200 ml  Net -2400 ml    Last 3 Weights 12/20/2020 11/28/2011 11/27/2011  Weight (lbs) 253 lb 8.5 oz 225 lb 12 oz 231 lb 4.2 oz  Weight (kg) 115 kg 102.4 kg 104.9 kg    Body mass index is 34.38 kg/m.  General: Well nourished, well developed, in no acute distress Head: Atraumatic, normal size  Eyes: PEERLA, EOMI  Neck: Supple, no JVD Endocrine: No thryomegaly Cardiac: Normal S1, S2; irregular rhythm Lungs: Clear to auscultation bilaterally, no wheezing, rhonchi or rales  Abd: Soft, nontender, no hepatomegaly  Ext: No edema, pulses 2+ Musculoskeletal: No deformities, BUE and BLE strength normal and equal Skin: Warm and dry, no rashes   Neuro: Alert and oriented to person, place, time, and situation, CNII-XII grossly intact, no focal deficits  Psych: Normal mood and  affect   Labs  High Sensitivity Troponin:   Recent Labs  Lab 12/21/20 0007 12/21/20 0200  TROPONINIHS 14 13     Cardiac EnzymesNo results for input(s): TROPONINI in the last 168 hours. No results for input(s): TROPIPOC in the last 168 hours.  Chemistry Recent Labs  Lab 12/21/20 0007 12/21/20 0200 12/21/20 1921 12/22/20 0754 12/23/20 0329  NA 122*   < > 123* 126* 125*  K 4.9   < > 4.3 4.1 3.7  CL 92*   < > 91* 94* 92*  CO2 20*   < > 23 20* 22  GLUCOSE 118*   < > 110* 96 98  BUN 80*   < > 77* 80* 80*  CREATININE 4.45*   < > 4.43* 4.43* 4.35*  CALCIUM 10.8*   < > 9.9 9.9 9.5  PROT 5.8*  --   --   --   --   ALBUMIN 3.2*  --  3.0*  --   --   AST 63*  --   --   --   --   ALT 41  --   --   --   --   ALKPHOS 84  --   --   --   --   BILITOT 1.3*  --   --   --   --   GFRNONAA 12*   < > 12* 12* 13*  ANIONGAP 10   < > 9 12 11    < > = values in this interval not displayed.     Hematology Recent Labs  Lab 12/21/20 0007 12/22/20 0754 12/23/20 0504  WBC 10.3 12.4* 9.1  RBC 2.80* 3.01* 2.82*  HGB 8.7* 9.2* 8.6*  HCT 25.5* 27.1* 25.0*  MCV 91.1 90.0 88.7  MCH 31.1 30.6 30.5  MCHC 34.1 33.9 34.4  RDW 15.0 15.1 14.7  PLT 140* 141* 135*   BNP Recent Labs  Lab 12/21/20 1203  BNP 969.4*    DDimer No results for input(s): DDIMER in the last 168 hours.   Radiology  DG Abd 1 View  Result Date: 12/22/2020 CLINICAL DATA:  Rule out foreign body. May have swallowed hearing aid EXAM: ABDOMEN - 1 VIEW COMPARISON:  None. FINDINGS: Electronic device to the left of L2. This appears to be within bowel and could be in the transverse colon, less likely the stomach. Nonobstructive bowel gas pattern. Mild degenerative change lumbar spine. IMPRESSION: Electronic device to the left of L2 compatible with hearing aid, likely in the transverse colon. Electronically Signed   By: Franchot Gallo M.D.   On: 12/22/2020 11:34   DG Abd Portable 1V  Result Date: 12/23/2020 CLINICAL DATA:  Gastric foreign body, may have swallowed hearing aid EXAM: PORTABLE ABDOMEN - 1 VIEW COMPARISON:  12/22/2020; correlation CT chest abdomen 12/21/2020 FINDINGS: Electronic components of a foreign body again identified LEFT paraspinal at L2-L3, unchanged. This projects over gastric antrum and transverse colon. Bowel gas pattern normal. Brachytherapy seed implants at prostate bed. Bones demineralized with degenerative changes lumbar spine. No urinary tract calcification. Atherosclerotic calcifications in the iliac systems. IMPRESSION: Persistent electronic foreign body LEFT paraspinal at L2-3 consistent with ingested hearing aid. This projects over the gastric antrum and the transverse colon and could be within either, though the lack of interval change in position since the previous study suggests gastric retention. Electronically Signed   By: Lavonia Dana M.D.   On: 12/23/2020 08:45   ECHOCARDIOGRAM  COMPLETE  Result Date: 12/22/2020    ECHOCARDIOGRAM  REPORT   Patient Name:   [name] [name] Date of Exam: 12/22/2020 Medical Rec #:  [id number]     Height:       72.0 in Accession #:    [id number]    Weight:       253.5 lb Date of Birth:  1933/08/26    BSA:          2.357 m Patient Age:    87 years      BP:           121/69 mmHg Patient Gender: M             HR:           51 bpm. Exam Location:  Inpatient Procedure: 3D Echo, 2D Echo, Cardiac Doppler and Color Doppler Indications:    I50.40* Unspecified combined systolic (congestive) and diastolic                 (congestive) heart failure  History:        Patient has prior history of Echocardiogram examinations, most                 recent 11/24/2011. CAD and Previous Myocardial Infarction,                 Abnormal ECG, Arrythmias:Atrial Fibrillation,                 Signs/Symptoms:Chest Pain; Risk Factors:Hypertension.  Sonographer:    Roseanna Rainbow RDCS Referring Phys: [id number] RONDELL A SMITH  Sonographer Comments: Technically difficult study due to poor echo windows. IMPRESSIONS  1. Left ventricular ejection fraction, by estimation, is 55 to 60%. The left ventricle has normal function. The left ventricle has no regional wall motion abnormalities. There is moderate left ventricular hypertrophy. Left ventricular diastolic parameters are indeterminate.  2. Right ventricular systolic function is normal. The right ventricular size is normal. There is normal pulmonary artery systolic pressure.  3. Left atrial size was moderately dilated.  4. Right atrial size was mildly dilated.  5. The pericardial effusion is anterior to the right ventricle.  6. The mitral valve is degenerative. Mild mitral valve regurgitation. No evidence of mitral stenosis. Moderate mitral annular calcification.  7. The aortic valve is tricuspid. There is moderate calcification of the aortic valve. Aortic valve regurgitation is not visualized. Mild to moderate aortic valve sclerosis/calcification is  present, without any evidence of aortic stenosis.  8. There is mild dilatation of the ascending aorta, measuring 38 mm.  9. The inferior vena cava is normal in size with greater than 50% respiratory variability, suggesting right atrial pressure of 3 mmHg. FINDINGS  Left Ventricle: Left ventricular ejection fraction, by estimation, is 55 to 60%. The left ventricle has normal function. The left ventricle has no regional wall motion abnormalities. The left ventricular internal cavity size was normal in size. There is  moderate left ventricular hypertrophy. Left ventricular diastolic parameters are indeterminate. Right Ventricle: The right ventricular size is normal. No increase in right ventricular wall thickness. Right ventricular systolic function is normal. There is normal pulmonary artery systolic pressure. The tricuspid regurgitant velocity is 2.46 m/s, and  with an assumed right atrial pressure of 8 mmHg, the estimated right ventricular systolic pressure is 54.2 mmHg. Left Atrium: Left atrial size was moderately dilated. Right Atrium: Right atrial size was mildly dilated. Pericardium: Trivial pericardial effusion is present. The pericardial effusion is anterior to the right ventricle. Mitral Valve: The mitral valve is degenerative in appearance. There is  mild thickening of the mitral valve leaflet(s). There is mild calcification of the mitral valve leaflet(s). Moderate mitral annular calcification. Mild mitral valve regurgitation. No evidence of mitral valve stenosis. Tricuspid Valve: The tricuspid valve is normal in structure. Tricuspid valve regurgitation is trivial. No evidence of tricuspid stenosis. Aortic Valve: The aortic valve is tricuspid. There is moderate calcification of the aortic valve. Aortic valve regurgitation is not visualized. Mild to moderate aortic valve sclerosis/calcification is present, without any evidence of aortic stenosis. Pulmonic Valve: The pulmonic valve was normal in structure.  Pulmonic valve regurgitation is not visualized. No evidence of pulmonic stenosis. Aorta: The aortic root is normal in size and structure. There is mild dilatation of the ascending aorta, measuring 38 mm. Venous: The inferior vena cava is normal in size with greater than 50% respiratory variability, suggesting right atrial pressure of 3 mmHg. IAS/Shunts: No atrial level shunt detected by color flow Doppler.  LEFT VENTRICLE PLAX 2D LVIDd:         4.90 cm      Diastology LVIDs:         3.50 cm      LV e' medial:   7.07 cm/s LV PW:         1.40 cm      LV E/e' medial: 8.8 LV IVS:        1.50 cm LVOT diam:     2.50 cm LV SV:         131 LV SV Index:   56 LVOT Area:     4.91 cm  LV Volumes (MOD) LV vol d, MOD A2C: 65.0 ml LV vol d, MOD A4C: 113.0 ml LV vol s, MOD A2C: 37.2 ml LV vol s, MOD A4C: 63.2 ml LV SV MOD A2C:     27.8 ml LV SV MOD A4C:     113.0 ml LV SV MOD BP:      39.3 ml RIGHT VENTRICLE             IVC RV S prime:     11.30 cm/s  IVC diam: 3.10 cm TAPSE (M-mode): 2.5 cm LEFT ATRIUM             Index       RIGHT ATRIUM           Index LA diam:        4.30 cm 1.82 cm/m  RA Area:     27.90 cm LA Vol (A2C):   95.6 ml 40.57 ml/m RA Volume:   87.20 ml  37.00 ml/m LA Vol (A4C):   79.0 ml 33.52 ml/m LA Biplane Vol: 90.6 ml 38.45 ml/m  AORTIC VALVE LVOT Vmax:   119.00 cm/s LVOT Vmean:  78.400 cm/s LVOT VTI:    0.267 m  AORTA Ao Root diam: 3.60 cm Ao Asc diam:  3.80 cm MITRAL VALVE               TRICUSPID VALVE MV Area (PHT): 4.68 cm    TR Peak grad:   24.2 mmHg MV Decel Time: 162 msec    TR Vmax:        246.00 cm/s MV E velocity: 62.35 cm/s                            SHUNTS  Systemic VTI:  0.27 m                            Systemic Diam: 2.50 cm Jenkins Rouge MD Electronically signed by Jenkins Rouge MD Signature Date/Time: 12/22/2020/4:14:48 PM    Final     Cardiac Studies  TTE 12/22/2020 1. Left ventricular ejection fraction, by estimation, is 55 to 60%. The  left ventricle has  normal function. The left ventricle has no regional  wall motion abnormalities. There is moderate left ventricular hypertrophy.  Left ventricular diastolic  parameters are indeterminate.  2. Right ventricular systolic function is normal. The right ventricular  size is normal. There is normal pulmonary artery systolic pressure.  3. Left atrial size was moderately dilated.  4. Right atrial size was mildly dilated.  5. The pericardial effusion is anterior to the right ventricle.  6. The mitral valve is degenerative. Mild mitral valve regurgitation. No  evidence of mitral stenosis. Moderate mitral annular calcification.  7. The aortic valve is tricuspid. There is moderate calcification of the  aortic valve. Aortic valve regurgitation is not visualized. Mild to  moderate aortic valve sclerosis/calcification is present, without any  evidence of aortic stenosis.  8. There is mild dilatation of the ascending aorta, measuring 38 mm.  9. The inferior vena cava is normal in size with greater than 50%  respiratory variability, suggesting right atrial pressure of 3 mmHg.  Patient Profile  87 year old male with history of CKD, CAD, hypertension, persistent atrial fibrillation, acid reflux who was admitted on 12/20/2020 with fatigue and found to have volume overload in the setting of AKI.   Assessment & Plan   1.  Volume overload/AKI/congestive heart failure -Echo shows normal LV function.  Volume status improving with diuresis.  Kidney function slowly improving. -To me, this appears to be AKI related.  Heart pressures on echo are normal. -Would recommend to continue home antihypertensive agents.  There is no specific treatment for a normal EF.  Would recommend good blood pressure control as well as close monitoring of kidney function.  2.  Permanent atrial fibrillation -Rate controlled on current medications. -Eliquis held for EGD. -Restart Eliquis once were able.  3.   Hyponatremia -Improving with diuresis.  Diuretics per renal.  Mr. Lenore Manner has a cardiologist in [place].  He apparently is here temporarily.  I did offer our assistance in the outpatient setting upon discharge.  She reports she is unclear how long he will be in town.  If he is here permanently she may reach out to our office for a follow-up appointment.  Cardiology will sign off at this time.  For questions or updates, please contact [place] Please consult [email or web address] for contact info under   Time Spent with Patient: I have spent a total of 15 minutes with patient reviewing hospital notes, telemetry, EKGs, labs and examining the patient as well as establishing an assessment and plan that was discussed with the patient.  > 50% of time was spent in direct patient care.    Signed, Addison Naegeli. Audie Box, MD, [place]  12/23/2020 11:07 AM

## 2020-12-23 NOTE — Progress Notes (Signed)
[facility] Progress Note  [name] [name] 87 y.o. April 12, 1933  CC:  Gastric foreign body  Subjective: Patient reports pain in ribs or shoulder.  Denies abdominal pain, nausea, or vomiting.  Last BM 4 days ago.  Per daughter, pt became confused after narcotic pain meds yesterday.  Daughter, at bedside, brought second hearing aid as an example, photo below:     ROS : Review of Systems  Gastrointestinal: Positive for constipation. Negative for abdominal pain, blood in stool, diarrhea, heartburn, melena, nausea and vomiting.  Musculoskeletal: Positive for back pain and joint pain.   Objective: Vital signs in last 24 hours: Vitals:   12/22/20 2033 12/23/20 0456  BP: 128/71 131/68  Pulse: (!) 46 (!) 57  Resp: 20 20  Temp:  98.2 F (36.8 C)  SpO2: 98% 97%    Physical Exam:  General:  Lethargic, cooperative, no distress  Head:  Normocephalic, without obvious abnormality, atraumatic  Eyes:  Anicteric sclera, EOMs intact   Lungs:   Clear to auscultation bilaterally, respirations unlabored  Heart:  Regular rate and rhythm, S1, S2 normal  Abdomen:   Soft, non-tender, non-distended, bowel sounds active all four quadrants,  no masses,   Extremities: No lower extremity edema    Lab Results: Recent Labs    12/21/20 1921 12/22/20 0754 12/23/20 0329  NA 123* 126* 125*  K 4.3 4.1 3.7  CL 91* 94* 92*  CO2 23 20* 22  GLUCOSE 110* 96 98  BUN 77* 80* 80*  CREATININE 4.43* 4.43* 4.35*  CALCIUM 9.9 9.9 9.5  PHOS 5.9*  --   --    Recent Labs    12/21/20 0007 12/21/20 1921  AST 63*  --   ALT 41  --   ALKPHOS 84  --   BILITOT 1.3*  --   PROT 5.8*  --   ALBUMIN 3.2* 3.0*   Recent Labs    12/21/20 0007 12/22/20 0754 12/23/20 0504  WBC 10.3 12.4* 9.1  NEUTROABS 8.3*  --   --   HGB 8.7* 9.2* 8.6*  HCT 25.5* 27.1* 25.0*  MCV 91.1 90.0 88.7  PLT 140* 141* 135*   No results for input(s): LABPROT, INR in the last 72 hours.    Assessment: Gastric foreign  body: accidental ingestion of hearing aid ingested >72h ago.  Suspect gastric retention based on serial x-rays.  Anemia.  No active GI bleeding.  Hemoccult negative. -Hgb 8.6, stable  Duodenitis, possible peptic ulcer, based on CT scan.  Hyponatremia: Na 125 today  Fall with facial and chest trauma, rib fractures  Acute on chronic heart failure   AKI on CKD: BUN 80/ Cr 4.35  Chronic anticoagulation, apixaban   Plan: EGD today.  I thoroughly discussed the procedure with the patient and patient's daughter to include nature, alternatives, benefits, and risks (including but not limited to bleeding, infection, perforation, anesthesia/cardiac and pulmonary complications). Patient and patient's daughter verbalized understanding and gave verbal consent to proceed with EGD.  Would increase Protonix to BID dosing (IV) if OK from renal standpoint.  Salley Slaughter PA-C 12/23/2020, 9:25 AM  Contact #  [phone]

## 2020-12-23 NOTE — Anesthesia Postprocedure Evaluation (Signed)
Anesthesia Post Note  Patient: [name] [name]  Procedure(s) Performed: ESOPHAGOGASTRODUODENOSCOPY (EGD) (N/A )     Patient location during evaluation: Endoscopy Anesthesia Type: General Level of consciousness: awake and alert Pain management: pain level controlled Vital Signs Assessment: post-procedure vital signs reviewed and stable Respiratory status: spontaneous breathing, nonlabored ventilation, respiratory function stable and patient connected to nasal cannula oxygen Cardiovascular status: blood pressure returned to baseline and stable Postop Assessment: no apparent nausea or vomiting Anesthetic complications: no   No complications documented.  Last Vitals:  Vitals:   12/23/20 1315 12/23/20 1325  BP: (!) 75/49 102/62  Pulse: 77 60  Resp: 12 15  Temp:    SpO2: 99% 99%    Last Pain:  Vitals:   12/23/20 1439  TempSrc:   PainSc: [place]

## 2020-12-23 NOTE — Op Note (Addendum)
[facility] Patient Name: [name] [name] Procedure Date : 12/23/2020 MRN: [id number] Attending MD: Arta Silence , MD Date of Birth: 10-Aug-1933 CSN: [id number] Age: 87 Admit Type: Inpatient Procedure:                Upper GI endoscopy Indications:              Acute post hemorrhagic anemia, Abnormal CT of the                            GI tract (suspected foreign body (= hearing aid) in                            stomach Providers:                Arta Silence, MD, Kary Kos RN, RN, Dulcy Fanny, Lesia Sago, Technician Referring MD:             [facility]. Medicines:                General Anesthesia Complications:            No immediate complications. Estimated Blood Loss:     Estimated blood loss: none. Procedure:                Pre-Anesthesia Assessment:                           - Prior to the procedure, a History and Physical                            was performed, and patient medications and                            allergies were reviewed. The patient's tolerance of                            previous anesthesia was also reviewed. The risks                            and benefits of the procedure and the sedation                            options and risks were discussed with the patient.                            All questions were answered, and informed consent                            was obtained. Prior Anticoagulants: The patient has                            taken Eliquis (apixaban), last dose was 3 days  prior to procedure. ASA Grade Assessment: III - A                            patient with severe systemic disease. After                            reviewing the risks and benefits, the patient was                            deemed in satisfactory condition to undergo the                            procedure.                           After obtaining informed consent, the  endoscope was                            passed under direct vision. Throughout the                            procedure, the patient's blood pressure, pulse, and                            oxygen saturations were monitored continuously. The                            GIF-H190 ([id number]) Olympus gastroscope was                            introduced through the mouth, and advanced to the                            second part of duodenum. The upper GI endoscopy was                            accomplished without difficulty. The patient                            tolerated the procedure well. Scope In: Scope Out: Findings:      A medium-sized hiatal hernia was present.      The exam of the esophagus was otherwise normal.      One non-bleeding cratered gastric ulcer with no stigmata of bleeding was       found in the prepyloric region of the stomach. The lesion was 8 mm in       largest dimension.      Patchy mild inflammation was found in the gastric body and in the       gastric antrum.      The exam of the stomach was otherwise normal. Stomach was readily       insufflated and thoroughly inspected; while the hearing aid is near       flesh-colored, I could not identify a hearing aid or any other type of       foreign body in the stomach (or  anywhere else in the upper GI tract).      Patchy mild inflammation characterized by congestion (edema) and       erythema was found in the duodenal bulb.      The exam of the duodenum was otherwise normal. Impression:               - Medium-sized hiatal hernia.                           - Non-bleeding gastric ulcer with no stigmata of                            bleeding.                           - Gastritis.                           - Duodenitis.                           - No foreign body seen to the extent of our                            examination (D2). Moderate Sedation:      None Recommendation:           - Return patient to hospital  ward for ongoing care.                           - Soft diet today.                           - Continue present medications.                           - Based on finding of ulcer, would hold Apixaban                            for another 5 days, if at all possible.                           - Pantoprazole 40 mg po bid x 6 weeks, then 40 mg                            po qd thereafter indefinitely.                           - Perform an H. pylori serology tomorrow.                           - Miralax 17 grams po bid, to facilitate passage of                            foreign body.                           -  Serial (daily) abdominal xrays.                           - [facility] will follow. Procedure Code(s):        --- Professional ---                           [id number], Esophagogastroduodenoscopy, flexible,                            transoral; diagnostic, including collection of                            specimen(s) by brushing or washing, when performed                            (separate procedure) Diagnosis Code(s):        --- Professional ---                           K44.9, Diaphragmatic hernia without obstruction or                            gangrene                           K25.9, Gastric ulcer, unspecified as acute or                            chronic, without hemorrhage or perforation                           K29.70, Gastritis, unspecified, without bleeding                           K29.80, Duodenitis without bleeding                           D62, Acute posthemorrhagic anemia                           R93.3, Abnormal findings on diagnostic imaging of                            other parts of digestive tract CPT copyright 2019 [employer]. All rights reserved. The codes documented in this report are preliminary and upon coder review may  be revised to meet current compliance requirements. Arta Silence, MD 12/23/2020 12:49:03 PM This report has been  signed electronically. Number of Addenda: 0

## 2020-12-23 NOTE — Progress Notes (Signed)
[facility] Progress Note    Assessment/ Plan:   1.  Presumed AKI on CKD 3: Baseline Cr from 12/2019 was 3.8 from PCP records.  No values since then so possibly could have progressed since then.  CT scan shows bilateral complex renal cysts but no obstruction.  No hypotensive episodes unless falls were vagal/ syncopal events.  UA unremarkable at present.  Priority will be getting volume off and improving Na.  Would hold cozaar. Continue IV Lasix. No indication for dialysis at present.  Cr is stable at 4.4, ? Current baseline  2.  Hyponatremia: appears to be hypervolemic hyponatremia in the setting of too much fluid.  Improving, continue IV Lasix at increased dose of 80 IV BID.  1200 mL fluid restriction  3.  Acute on chronic CHF exacerbation: cardiology on board- appreciate assistance  4.  Acute encephalopathy/ falls- may be precipitated by the hyponatremia.  Mental status is better today  5. Rib fractures/ facial trauma- supportive care, please avoid morphine in CKD pt  6.  Retained hearing Aid in stomach- GI following, planning for endoscopy, had it today 12/23/20, did not find  7.  Afib: Eliquis being held for possible endoscopy  Subjective:    Na stagnating around 125.  Wasn't on fluid restriction yesterday.  EGD today with no foreign body retained.     Objective:   BP 109/70 (BP Location: Left Arm)   Pulse 63   Temp (!) 97.3 F (36.3 C) (Oral)   Resp 19   Ht 6' (1.829 m)   Wt 115 kg   SpO2 98%   BMI 34.38 kg/m   Intake/Output Summary (Last 24 hours) at 12/23/2020 1703 Last data filed at 12/23/2020 1643 Gross per 24 hour  Intake 260 ml  Output 4400 ml  Net -4140 ml   Weight change:   Physical Exam: GEN NAD, lying in bed HEENT EOMI PERRL, tacky MM NECK + JVD, improved however PULM muffled bibasilar breath sounds CV irregular ABD soft EXT 1+ LE edema, slightly improved NEURO more awake than yesterday SKIN tanned  Imaging: DG Abd 1 View  Result  Date: 12/22/2020 CLINICAL DATA:  Rule out foreign body. May have swallowed hearing aid EXAM: ABDOMEN - 1 VIEW COMPARISON:  None. FINDINGS: Electronic device to the left of L2. This appears to be within bowel and could be in the transverse colon, less likely the stomach. Nonobstructive bowel gas pattern. Mild degenerative change lumbar spine. IMPRESSION: Electronic device to the left of L2 compatible with hearing aid, likely in the transverse colon. Electronically Signed   By: Franchot Gallo M.D.   On: 12/22/2020 11:34   DG Abd Portable 1V  Result Date: 12/23/2020 CLINICAL DATA:  Gastric foreign body, may have swallowed hearing aid EXAM: PORTABLE ABDOMEN - 1 VIEW COMPARISON:  12/22/2020; correlation CT chest abdomen 12/21/2020 FINDINGS: Electronic components of a foreign body again identified LEFT paraspinal at L2-L3, unchanged. This projects over gastric antrum and transverse colon. Bowel gas pattern normal. Brachytherapy seed implants at prostate bed. Bones demineralized with degenerative changes lumbar spine. No urinary tract calcification. Atherosclerotic calcifications in the iliac systems. IMPRESSION: Persistent electronic foreign body LEFT paraspinal at L2-3 consistent with ingested hearing aid. This projects over the gastric antrum and the transverse colon and could be within either, though the lack of interval change in position since the previous study suggests gastric retention. Electronically Signed   By: Lavonia Dana M.D.   On: 12/23/2020 08:45   ECHOCARDIOGRAM COMPLETE  Result  Date: 12/22/2020    ECHOCARDIOGRAM REPORT   Patient Name:   OAKLEE SUNGA Date of Exam: 12/22/2020 Medical Rec #:  [id number]     Height:       72.0 in Accession #:    [id number]    Weight:       253.5 lb Date of Birth:  May 16, 1933    BSA:          2.357 m Patient Age:    87 years      BP:           121/69 mmHg Patient Gender: M             HR:           51 bpm. Exam Location:  Inpatient Procedure: 3D Echo, 2D Echo, Cardiac  Doppler and Color Doppler Indications:    I50.40* Unspecified combined systolic (congestive) and diastolic                 (congestive) heart failure  History:        Patient has prior history of Echocardiogram examinations, most                 recent 11/24/2011. CAD and Previous Myocardial Infarction,                 Abnormal ECG, Arrythmias:Atrial Fibrillation,                 Signs/Symptoms:Chest Pain; Risk Factors:Hypertension.  Sonographer:    Roseanna Rainbow RDCS Referring Phys: [id number] RONDELL A SMITH  Sonographer Comments: Technically difficult study due to poor echo windows. IMPRESSIONS  1. Left ventricular ejection fraction, by estimation, is 55 to 60%. The left ventricle has normal function. The left ventricle has no regional wall motion abnormalities. There is moderate left ventricular hypertrophy. Left ventricular diastolic parameters are indeterminate.  2. Right ventricular systolic function is normal. The right ventricular size is normal. There is normal pulmonary artery systolic pressure.  3. Left atrial size was moderately dilated.  4. Right atrial size was mildly dilated.  5. The pericardial effusion is anterior to the right ventricle.  6. The mitral valve is degenerative. Mild mitral valve regurgitation. No evidence of mitral stenosis. Moderate mitral annular calcification.  7. The aortic valve is tricuspid. There is moderate calcification of the aortic valve. Aortic valve regurgitation is not visualized. Mild to moderate aortic valve sclerosis/calcification is present, without any evidence of aortic stenosis.  8. There is mild dilatation of the ascending aorta, measuring 38 mm.  9. The inferior vena cava is normal in size with greater than 50% respiratory variability, suggesting right atrial pressure of 3 mmHg. FINDINGS  Left Ventricle: Left ventricular ejection fraction, by estimation, is 55 to 60%. The left ventricle has normal function. The left ventricle has no regional wall motion abnormalities.  The left ventricular internal cavity size was normal in size. There is  moderate left ventricular hypertrophy. Left ventricular diastolic parameters are indeterminate. Right Ventricle: The right ventricular size is normal. No increase in right ventricular wall thickness. Right ventricular systolic function is normal. There is normal pulmonary artery systolic pressure. The tricuspid regurgitant velocity is 2.46 m/s, and  with an assumed right atrial pressure of 8 mmHg, the estimated right ventricular systolic pressure is 54.6 mmHg. Left Atrium: Left atrial size was moderately dilated. Right Atrium: Right atrial size was mildly dilated. Pericardium: Trivial pericardial effusion is present. The pericardial effusion is anterior to the right ventricle. Mitral Valve: The mitral  valve is degenerative in appearance. There is mild thickening of the mitral valve leaflet(s). There is mild calcification of the mitral valve leaflet(s). Moderate mitral annular calcification. Mild mitral valve regurgitation. No evidence of mitral valve stenosis. Tricuspid Valve: The tricuspid valve is normal in structure. Tricuspid valve regurgitation is trivial. No evidence of tricuspid stenosis. Aortic Valve: The aortic valve is tricuspid. There is moderate calcification of the aortic valve. Aortic valve regurgitation is not visualized. Mild to moderate aortic valve sclerosis/calcification is present, without any evidence of aortic stenosis. Pulmonic Valve: The pulmonic valve was normal in structure. Pulmonic valve regurgitation is not visualized. No evidence of pulmonic stenosis. Aorta: The aortic root is normal in size and structure. There is mild dilatation of the ascending aorta, measuring 38 mm. Venous: The inferior vena cava is normal in size with greater than 50% respiratory variability, suggesting right atrial pressure of 3 mmHg. IAS/Shunts: No atrial level shunt detected by color flow Doppler.  LEFT VENTRICLE PLAX 2D LVIDd:         4.90  cm      Diastology LVIDs:         3.50 cm      LV e' medial:   7.07 cm/s LV PW:         1.40 cm      LV E/e' medial: 8.8 LV IVS:        1.50 cm LVOT diam:     2.50 cm LV SV:         131 LV SV Index:   56 LVOT Area:     4.91 cm  LV Volumes (MOD) LV vol d, MOD A2C: 65.0 ml LV vol d, MOD A4C: 113.0 ml LV vol s, MOD A2C: 37.2 ml LV vol s, MOD A4C: 63.2 ml LV SV MOD A2C:     27.8 ml LV SV MOD A4C:     113.0 ml LV SV MOD BP:      39.3 ml RIGHT VENTRICLE             IVC RV S prime:     11.30 cm/s  IVC diam: 3.10 cm TAPSE (M-mode): 2.5 cm LEFT ATRIUM             Index       RIGHT ATRIUM           Index LA diam:        4.30 cm 1.82 cm/m  RA Area:     27.90 cm LA Vol (A2C):   95.6 ml 40.57 ml/m RA Volume:   87.20 ml  37.00 ml/m LA Vol (A4C):   79.0 ml 33.52 ml/m LA Biplane Vol: 90.6 ml 38.45 ml/m  AORTIC VALVE LVOT Vmax:   119.00 cm/s LVOT Vmean:  78.400 cm/s LVOT VTI:    0.267 m  AORTA Ao Root diam: 3.60 cm Ao Asc diam:  3.80 cm MITRAL VALVE               TRICUSPID VALVE MV Area (PHT): 4.68 cm    TR Peak grad:   24.2 mmHg MV Decel Time: 162 msec    TR Vmax:        246.00 cm/s MV E velocity: 62.35 cm/s                            SHUNTS  Systemic VTI:  0.27 m                            Systemic Diam: 2.50 cm Jenkins Rouge MD Electronically signed by Jenkins Rouge MD Signature Date/Time: 12/22/2020/4:14:48 PM    Final     Labs: BMET Recent Labs  Lab 12/21/20 0007 12/21/20 0200 12/21/20 0848 12/21/20 1921 12/22/20 0754 12/23/20 0329  NA 122* 122* 121* 123* 126* 125*  K 4.9 4.8 4.6 4.3 4.1 3.7  CL 92* 92* 91* 91* 94* 92*  CO2 20* 18* 20* 23 20* 22  GLUCOSE 118* 107* 86 110* 96 98  BUN 80* 80* 79* 77* 80* 80*  CREATININE 4.45* 4.32* 4.36* 4.43* 4.43* 4.35*  CALCIUM 10.8* 10.5* 10.0 9.9 9.9 9.5  PHOS  --   --   --  5.9*  --   --    CBC Recent Labs  Lab 12/21/20 0007 12/22/20 0754 12/23/20 0504  WBC 10.3 12.4* 9.1  NEUTROABS 8.3*  --   --   HGB 8.7* 9.2* 8.6*  HCT 25.5*  27.1* 25.0*  MCV 91.1 90.0 88.7  PLT 140* 141* 135*    Medications:    . allopurinol  100 mg Oral Daily  . amLODipine  5 mg Oral Daily  . aspirin EC  81 mg Oral Daily  . carvedilol  25 mg Oral BID WC  . furosemide  80 mg Intravenous BID  . latanoprost  1 drop Both Eyes QHS  . lidocaine  1 patch Transdermal Q24H  . lidocaine  1 patch Transdermal Q24H  . pantoprazole  40 mg Oral BID AC  . rosuvastatin  5 mg Oral Q M,W,F  . sodium chloride flush  3 mL Intravenous Q12H      Madelon Lips, MD 12/23/2020, 5:03 PM

## 2020-12-23 NOTE — Plan of Care (Signed)
°  Problem: Nutrition: °Goal: Adequate nutrition will be maintained °Outcome: Progressing °  °Problem: Coping: °Goal: Level of anxiety will decrease °Outcome: Progressing °  °Problem: Safety: °Goal: Ability to remain free from injury will improve °Outcome: Progressing °  °

## 2020-12-23 NOTE — Anesthesia Procedure Notes (Signed)
Procedure Name: Intubation Date/Time: 12/23/2020 12:19 PM Performed by: Thelma Comp, CRNA Pre-anesthesia Checklist: Patient identified, Emergency Drugs available, Suction available and Patient being monitored Patient Re-evaluated:Patient Re-evaluated prior to induction Oxygen Delivery Method: Circle System Utilized Preoxygenation: Pre-oxygenation with 100% oxygen Induction Type: IV induction Ventilation: Mask ventilation without difficulty Laryngoscope Size: Mac and 4 Grade View: Grade I Tube type: Oral Number of attempts: 1 Airway Equipment and Method: Stylet and Oral airway Placement Confirmation: ETT inserted through vocal cords under direct vision,  positive ETCO2 and breath sounds checked- equal and bilateral Secured at: 23 cm Tube secured with: Tape Dental Injury: Teeth and Oropharynx as per pre-operative assessment

## 2020-12-23 NOTE — Progress Notes (Signed)
PROGRESS NOTE    [name] [name]  RXV:[id number] DOB: [birth year] DOA: 12/20/2020 PCP: Antony Contras, MD   Chief Complaint  Patient presents with   Fall    Taking Eliquis  Brief Narrative:  87 old male with history of hypertension, A. fib on Eliquis, CAD history of PCI in 5093, chronic systolic CHF EF [id number], GERD, chronic kidney disease stage III brought to the ED after fall at home.  He has had recent falls and while at the shower on 4/5 he fell.  Patient reported that he lost balance. Per report denies any loss of consciousness or trauma to his head. He has been ambulating with use of a walker.  Apparently patient has had severe back pain and sciatica for which he has been seen by physical therapy and has a scheduled appointment with orthopedics.  Due to the pain in his back he has not been sleeping well at night  In the ED he was afebrile, saturating well on room air, CT scan of the head and cervical spine showed no acute abnormality, but did note a partially visualized right-sided pleural effusion.  Labs significant for for hemoglobin 8.7, platelets 140, sodium 122, chloride 92, CO2 20, BUN 80, creatinine 4.45, calcium 10.8, AST 63, serum osmolarity 287, and TSH 2.577.  Urinalysis was positive for 30 mg/dL protein, but showed no significant signs of infection.  CT scan of the chest for bilateral nondisplaced rib fractures, a metallic foreign body in the gastric body measuring 2.1 cm possibly hearing aid, possible duodenitis/peptic ulcer disease, cardiomegaly with signs of fluid overload with pulmonary edema, and a 4.2 cm ascending aortic aneurysm.  patient was given normal saline IVFs at 75 ml/hr, Patient was admitted and nephrology, gastroenterology and cardiology were consulted. Patient became lethargic after getting morphine in the ED.  Subjective: Seen and examined this morning.  He complains of pain diffuse mostly in bilateral shoulders, in the back His daughter is at the  bedside. Blood pressure stable.  Afebrile overnight. Sodium 125, Wbc normal hb at 8.6gm  Assessment & Plan:  Multiple falls at home Chronic low back-scheduled to see [facility] 4/18 per son. He has had at least 4  Falls at home  likely multifactorial in setting of hyponatremia and fluid overload chronic low back pain and physical deconditioning.  Continue supportive measures PT OT, pain control.  Can consider MRI of the spine if able to retrieve the foreign body from the stoma w/ EGD.  Multiple fractures of ribs,bilateral,due to fall:Continue incentive spirometry, lidocaine patch, pain control with Tylenol-patient family reluctant to use narcotics given patient was lethargic after getting morphine in the ED.    Acute encephalopathy following IV morphine in the ED family reluctant about opiates.Discussed if he has uncontrolled pain in the abdomen consider tramadol or hydrocodone at least.  Hyponatremia: Suspect hypervolemic hyponatremia diuresing as per nephrology with Lasix.  Sodium up at 125.Monitor labs closely.Nephrology following.   Recent Labs  Lab 12/21/20 0200 12/21/20 0848 12/21/20 1921 12/22/20 0754 12/23/20 0329  NA 122* 121* 123* 126* 125*   AKI on CKD 3?-unknown baseline creatinine.Fax number provided to the son to get labs from PCP today. CT shows bilateral complex renal cyst.Continue diuresis BUN/creatinine remained stable at 80/4.3 UA unremarkable.  Holding Cozaar  For now.  He had dood diuresis 3.5 L past 24 hours appreciate nephrology Recent Labs  Lab 12/21/20 0200 12/21/20 0848 12/21/20 1921 12/22/20 0754 12/23/20 0329  BUN 80* 79* 77* 80* 80*  CREATININE 4.32* 4.36* 4.43*  4.43* 4.35*  Net IO Since Admission: -3,550 mL [12/23/20 0756]  Intake/Output Summary (Last 24 hours) at 12/23/2020 0756 Last data filed at 12/23/2020 0600 Gross per 24 hour  Intake 1100 ml  Output 3450 ml  Net -2350 ml   Foreign body in stomach Duodenitis/PUD:hearing aid in GIT in  imaging Continue PPI, and GI has been consulted and going for EGD this am.Eliquis on hold.  Iron deficiency anemia:guaiac was noted to be negative.  Likely from chronic kidney disease iron level 37.  Monitor H&H transfuse if less than 7 g Recent Labs  Lab 12/21/20 0007 12/22/20 0754 12/23/20 0504  HGB 8.7* 9.2* 8.6*  HCT 25.5* 27.1* 25.0*   HTN: Well-controlled.  Continue amlodipine 5 Coreg 25 and diuresis.Holding ACE or ARB.  CAD S/P angioplasty 2013:trop neg 14>13.continue home aspirin, beta-blocker and statin.  Acute on chronic CHF:BNP elevated hypervolemic.echo shows normal LV function suspect CHF with preserved EF. Cont in iv diuresis as per nephrology and cardiology.  Appreciate cardiology input on board.  Permanent A. fib on Eliquis now with multiple falls rate controlled on Coreg. Eliquis held for possible endoscopy.  Defer to cardiology regarding outpatient Eliquis/anticoagulation discussion.  Restart Eliquis once okay with GI.  Patient is here for temporary relief.  Is followed by cardiologist in [place].  Thrombocytopenia: Overall remained stable 130-140 K.  Recent Labs  Lab 12/21/20 0007 12/22/20 0754 12/23/20 0504  PLT 140* 141* 135*   Hypercalcemia: Calcium level is stable 9.9.  Hyperlipidemia cont Crestor  MorBid Obesity BMI 34: Will benefit with weight loss PCP follow-up.  Diet Order             Diet NPO time specified  Diet effective midnight                 Patient's Body mass index is 34.38 kg/m.  DVT prophylaxis: SCD.  Anticoagulation on hold in the setting of possible GI procedure Code Status:   Code Status: Full Code  Family Communication: plan of care discussed with patient and his son at bedside previously. Discussed with patient's daughter at the bedside  Status is: Inpatient Remains inpatient appropriate because:IV treatments appropriate due to intensity of illness or inability to take PO and Inpatient level of care appropriate due to  severity of illness  Dispo: The patient is from: Home              Anticipated d/c is to:  TBD              Patient currently is not medically stable to d/c.   Difficult to place patient No   Unresulted Labs (From admission, onward)            Start     Ordered   12/23/20 0500  CBC  Daily,   R     Question:  Specimen collection method  Answer:  Lab=Lab collect   12/22/20 0829   12/23/20 5035  Basic metabolic panel  Daily,   R     Question:  Specimen collection method  Answer:  Lab=Lab collect   12/22/20 0829   12/22/20 4656  Basic metabolic panel  Daily,   R      12/22/20 0645          Medications reviewed:  Scheduled Meds:  allopurinol  100 mg Oral Daily   amLODipine  5 mg Oral Daily   aspirin EC  81 mg Oral Daily   carvedilol  25 mg Oral BID WC  furosemide  80 mg Intravenous BID   latanoprost  1 drop Both Eyes QHS   lidocaine  1 patch Transdermal Q24H   lidocaine  1 patch Transdermal Q24H   pantoprazole  40 mg Oral Daily   rosuvastatin  5 mg Oral Q M,W,F   sodium chloride flush  3 mL Intravenous Q12H   Continuous Infusions:  sodium chloride      Consultants: Nephrology, cardiology GI   Procedures:see note  Antimicrobials: Anti-infectives (From admission, onward)    None      Culture/Microbiology No results found for: SDES, SPECREQUEST, CULT, REPTSTATUS  Other culture-see note  Objective: Vitals: Today's Vitals   12/22/20 2144 12/22/20 2228 12/23/20 0456 12/23/20 0700  BP:   131/68   Pulse:   (!) 57   Resp:   20   Temp:   98.2 F (36.8 C)   TempSrc:   Oral   SpO2:   97%   Weight:      Height:      PainSc: 8  Asleep  0-No pain    Intake/Output Summary (Last 24 hours) at 12/23/2020 0752 Last data filed at 12/23/2020 0600 Gross per 24 hour  Intake 1100 ml  Output 3450 ml  Net -2350 ml   Filed Weights   12/20/20 2325  Weight: 115 kg   Weight change:   Intake/Output from previous day: 04/07 0701 - 04/08 0700 In: 1100  [P.O.:1100] Out: 3450 [Urine:3450] Intake/Output this shift: No intake/output data recorded. Filed Weights   12/20/20 2325  Weight: 115 kg    Examination: General exam: AAO  elderly, frail,weak appearing. HEENT:Oral mucosa moist, Ear/Nose WNL grossly, dentition normal. Respiratory system: bilaterally clear,no wheezing or crackles,no use of accessory muscle Cardiovascular system: S1 & S2 +, No JVD. Gastrointestinal system: Abdomen soft, NT,ND, BS+ Nervous System:Alert, awake, he is moving extremities and grossly nonfocal Extremities: mild edema, distal peripheral pulses palpable.  Skin: No rashes,no icterus. MSK: Normal muscle bulk,tone, power. Tender shoulder and chest walls   Data Reviewed: I have personally reviewed following labs and imaging studies CBC: Recent Labs  Lab 12/21/20 0007 12/22/20 0754 12/23/20 0504  WBC 10.3 12.4* 9.1  NEUTROABS 8.3*  --   --   HGB 8.7* 9.2* 8.6*  HCT 25.5* 27.1* 25.0*  MCV 91.1 90.0 88.7  PLT 140* 141* 622*   Basic Metabolic Panel: Recent Labs  Lab 12/21/20 0200 12/21/20 0848 12/21/20 1921 12/22/20 0754 12/23/20 0329  NA 122* 121* 123* 126* 125*  K 4.8 4.6 4.3 4.1 3.7  CL 92* 91* 91* 94* 92*  CO2 18* 20* 23 20* 22  GLUCOSE 107* 86 110* 96 98  BUN 80* 79* 77* 80* 80*  CREATININE 4.32* 4.36* 4.43* 4.43* 4.35*  CALCIUM 10.5* 10.0 9.9 9.9 9.5  PHOS  --   --  5.9*  --   --    GFR: Estimated Creatinine Clearance: 16.3 mL/min (A) (by C-G formula based on SCr of 4.35 mg/dL (H)). Liver Function Tests: Recent Labs  Lab 12/21/20 0007 12/21/20 1921  AST 63*  --   ALT 41  --   ALKPHOS 84  --   BILITOT 1.3*  --   PROT 5.8*  --   ALBUMIN 3.2* 3.0*   Recent Labs  Lab 12/21/20 0007  LIPASE 41   No results for input(s): AMMONIA in the last 168 hours. Coagulation Profile: No results for input(s): INR, PROTIME in the last 168 hours. Cardiac Enzymes: Recent Labs  Lab 12/21/20 0848  CKTOTAL 480*  BNP (last 3 results) No  results for input(s): PROBNP in the last 8760 hours. HbA1C: No results for input(s): HGBA1C in the last 72 hours. CBG: No results for input(s): GLUCAP in the last 168 hours. Lipid Profile: No results for input(s): CHOL, HDL, LDLCALC, TRIG, CHOLHDL, LDLDIRECT in the last 72 hours. Thyroid Function Tests: Recent Labs    12/21/20 0200  TSH 2.577   Anemia Panel: Recent Labs    12/21/20 0200  VITAMINB12 1,156*  FOLATE 8.1  TIBC 244*  IRON 37*   Sepsis Labs: No results for input(s): PROCALCITON, LATICACIDVEN in the last 168 hours.  Recent Results (from the past 240 hour(s))  Resp Panel by RT-PCR (Flu A&B, Covid) Nasopharyngeal Swab     Status: None   Collection Time: 12/21/20  2:45 AM   Specimen: Nasopharyngeal Swab; Nasopharyngeal(NP) swabs in vial transport medium  Result Value Ref Range Status   SARS Coronavirus 2 by RT PCR NEGATIVE NEGATIVE Final    Comment: (NOTE) SARS-CoV-2 target nucleic acids are NOT DETECTED.  The SARS-CoV-2 RNA is generally detectable in upper respiratory specimens during the acute phase of infection. The lowest concentration of SARS-CoV-2 viral copies this assay can detect is 138 copies/mL. A negative result does not preclude SARS-Cov-2 infection and should not be used as the sole basis for treatment or other patient management decisions. A negative result may occur with  improper specimen collection/handling, submission of specimen other than nasopharyngeal swab, presence of viral mutation(s) within the areas targeted by this assay, and inadequate number of viral copies(<138 copies/mL). A negative result must be combined with clinical observations, patient history, and epidemiological information. The expected result is Negative.  Fact Sheet for Patients:  EntrepreneurPulse.com.au  Fact Sheet for Healthcare Providers:  IncredibleEmployment.be  This test is no t yet approved or cleared by the [place]  FDA and  has been authorized for detection and/or diagnosis of SARS-CoV-2 by FDA under an Emergency Use Authorization (EUA). This EUA will remain  in effect (meaning this test can be used) for the duration of the COVID-19 declaration under Section 564(b)(1) of the Act, 21 U.S.C.section 360bbb-3(b)(1), unless the authorization is terminated  or revoked sooner.       Influenza A by PCR NEGATIVE NEGATIVE Final   Influenza B by PCR NEGATIVE NEGATIVE Final    Comment: (NOTE) The Xpert Xpress SARS-CoV-2/FLU/RSV plus assay is intended as an aid in the diagnosis of influenza from Nasopharyngeal swab specimens and should not be used as a sole basis for treatment. Nasal washings and aspirates are unacceptable for Xpert Xpress SARS-CoV-2/FLU/RSV testing.  Fact Sheet for Patients: EntrepreneurPulse.com.au  Fact Sheet for Healthcare Providers: IncredibleEmployment.be  This test is not yet approved or cleared by the [place] FDA and has been authorized for detection and/or diagnosis of SARS-CoV-2 by FDA under an Emergency Use Authorization (EUA). This EUA will remain in effect (meaning this test can be used) for the duration of the COVID-19 declaration under Section 564(b)(1) of the Act, 21 U.S.C. section 360bbb-3(b)(1), unless the authorization is terminated or revoked.  Performed at [facility], [place] [address] [place], [place] [address]      Radiology Studies: DG Abd 1 View  Result Date: 12/22/2020 CLINICAL DATA:  Rule out foreign body. May have swallowed hearing aid EXAM: ABDOMEN - 1 VIEW COMPARISON:  None. FINDINGS: Electronic device to the left of L2. This appears to be within bowel and could be in the transverse colon, less likely the stomach. Nonobstructive bowel  gas pattern. Mild degenerative change lumbar spine. IMPRESSION: Electronic device to the left of L2 compatible with hearing aid, likely in the transverse colon. Electronically  Signed   By: Franchot Gallo M.D.   On: 12/22/2020 11:34   ECHOCARDIOGRAM COMPLETE  Result Date: 12/22/2020    ECHOCARDIOGRAM REPORT   Patient Name:   LARRIE Brager Date of Exam: 12/22/2020 Medical Rec #:  [id number]     Height:       72.0 in Accession #:    [id number]    Weight:       253.5 lb Date of Birth:  31-Jul-1933    BSA:          2.357 m Patient Age:    87 years      BP:           121/69 mmHg Patient Gender: M             HR:           51 bpm. Exam Location:  Inpatient Procedure: 3D Echo, 2D Echo, Cardiac Doppler and Color Doppler Indications:    I50.40* Unspecified combined systolic (congestive) and diastolic                 (congestive) heart failure  History:        Patient has prior history of Echocardiogram examinations, most                 recent 11/24/2011. CAD and Previous Myocardial Infarction,                 Abnormal ECG, Arrythmias:Atrial Fibrillation,                 Signs/Symptoms:Chest Pain; Risk Factors:Hypertension.  Sonographer:    Roseanna Rainbow RDCS Referring Phys: [id number] RONDELL A SMITH  Sonographer Comments: Technically difficult study due to poor echo windows. IMPRESSIONS  1. Left ventricular ejection fraction, by estimation, is 55 to 60%. The left ventricle has normal function. The left ventricle has no regional wall motion abnormalities. There is moderate left ventricular hypertrophy. Left ventricular diastolic parameters are indeterminate.  2. Right ventricular systolic function is normal. The right ventricular size is normal. There is normal pulmonary artery systolic pressure.  3. Left atrial size was moderately dilated.  4. Right atrial size was mildly dilated.  5. The pericardial effusion is anterior to the right ventricle.  6. The mitral valve is degenerative. Mild mitral valve regurgitation. No evidence of mitral stenosis. Moderate mitral annular calcification.  7. The aortic valve is tricuspid. There is moderate calcification of the aortic valve. Aortic valve regurgitation is not  visualized. Mild to moderate aortic valve sclerosis/calcification is present, without any evidence of aortic stenosis.  8. There is mild dilatation of the ascending aorta, measuring 38 mm.  9. The inferior vena cava is normal in size with greater than 50% respiratory variability, suggesting right atrial pressure of 3 mmHg. FINDINGS  Left Ventricle: Left ventricular ejection fraction, by estimation, is 55 to 60%. The left ventricle has normal function. The left ventricle has no regional wall motion abnormalities. The left ventricular internal cavity size was normal in size. There is  moderate left ventricular hypertrophy. Left ventricular diastolic parameters are indeterminate. Right Ventricle: The right ventricular size is normal. No increase in right ventricular wall thickness. Right ventricular systolic function is normal. There is normal pulmonary artery systolic pressure. The tricuspid regurgitant velocity is 2.46 m/s, and  with an assumed right atrial pressure of 8 mmHg,  the estimated right ventricular systolic pressure is 41.9 mmHg. Left Atrium: Left atrial size was moderately dilated. Right Atrium: Right atrial size was mildly dilated. Pericardium: Trivial pericardial effusion is present. The pericardial effusion is anterior to the right ventricle. Mitral Valve: The mitral valve is degenerative in appearance. There is mild thickening of the mitral valve leaflet(s). There is mild calcification of the mitral valve leaflet(s). Moderate mitral annular calcification. Mild mitral valve regurgitation. No evidence of mitral valve stenosis. Tricuspid Valve: The tricuspid valve is normal in structure. Tricuspid valve regurgitation is trivial. No evidence of tricuspid stenosis. Aortic Valve: The aortic valve is tricuspid. There is moderate calcification of the aortic valve. Aortic valve regurgitation is not visualized. Mild to moderate aortic valve sclerosis/calcification is present, without any evidence of aortic  stenosis. Pulmonic Valve: The pulmonic valve was normal in structure. Pulmonic valve regurgitation is not visualized. No evidence of pulmonic stenosis. Aorta: The aortic root is normal in size and structure. There is mild dilatation of the ascending aorta, measuring 38 mm. Venous: The inferior vena cava is normal in size with greater than 50% respiratory variability, suggesting right atrial pressure of 3 mmHg. IAS/Shunts: No atrial level shunt detected by color flow Doppler.  LEFT VENTRICLE PLAX 2D LVIDd:         4.90 cm      Diastology LVIDs:         3.50 cm      LV e' medial:   7.07 cm/s LV PW:         1.40 cm      LV E/e' medial: 8.8 LV IVS:        1.50 cm LVOT diam:     2.50 cm LV SV:         131 LV SV Index:   56 LVOT Area:     4.91 cm  LV Volumes (MOD) LV vol d, MOD A2C: 65.0 ml LV vol d, MOD A4C: 113.0 ml LV vol s, MOD A2C: 37.2 ml LV vol s, MOD A4C: 63.2 ml LV SV MOD A2C:     27.8 ml LV SV MOD A4C:     113.0 ml LV SV MOD BP:      39.3 ml RIGHT VENTRICLE             IVC RV S prime:     11.30 cm/s  IVC diam: 3.10 cm TAPSE (M-mode): 2.5 cm LEFT ATRIUM             Index       RIGHT ATRIUM           Index LA diam:        4.30 cm 1.82 cm/m  RA Area:     27.90 cm LA Vol (A2C):   95.6 ml 40.57 ml/m RA Volume:   87.20 ml  37.00 ml/m LA Vol (A4C):   79.0 ml 33.52 ml/m LA Biplane Vol: 90.6 ml 38.45 ml/m  AORTIC VALVE LVOT Vmax:   119.00 cm/s LVOT Vmean:  78.400 cm/s LVOT VTI:    0.267 m  AORTA Ao Root diam: 3.60 cm Ao Asc diam:  3.80 cm MITRAL VALVE               TRICUSPID VALVE MV Area (PHT): 4.68 cm    TR Peak grad:   24.2 mmHg MV Decel Time: 162 msec    TR Vmax:        246.00 cm/s MV E velocity: 62.35 cm/s  SHUNTS                            Systemic VTI:  0.27 m                            Systemic Diam: 2.50 cm Jenkins Rouge MD Electronically signed by Jenkins Rouge MD Signature Date/Time: 12/22/2020/4:14:48 PM    Final      LOS: 2 days   Antonieta Pert, MD [facility]  12/23/2020, 7:52 AM

## 2020-12-23 NOTE — Transfer of Care (Signed)
Immediate Anesthesia Transfer of Care Note  Patient: [name] [name]  Procedure(s) Performed: ESOPHAGOGASTRODUODENOSCOPY (EGD) (N/A )  Patient Location: PACU  Anesthesia Type:General  Level of Consciousness: drowsy and patient cooperative  Airway & Oxygen Therapy: Patient Spontanous Breathing  Post-op Assessment: Report given to RN and Post -op Vital signs reviewed and stable  Post vital signs: Reviewed and stable  Last Vitals:  Vitals Value Taken Time  BP 107/54   Temp    Pulse 57 12/23/20 1248  Resp 14 12/23/20 1248  SpO2 95 % 12/23/20 1248  Vitals shown include unvalidated device data.  Last Pain:  Vitals:   12/23/20 1127  TempSrc: Oral  PainSc: 0-No pain      Patients Stated Pain Goal: 0 (87/21/58 7276)  Complications: No complications documented.

## 2020-12-23 NOTE — Progress Notes (Signed)
Physical Therapy Treatment Patient Details Name: [name] [name] MRN: [id number] DOB: 10/26/1932 Today's Date: 12/23/2020    History of Present Illness Pt adm 4/5 with falls and hyponatremia. Pt found to have bilateral nondisplaced anterior rib fx's. Pt also inadvertantly had swallowed his hearing aide. GI to retrieve hearing aide. PMH - HTN, afib, cad, chf, ckd, prostate CA    PT Comments    Pt with some improvement in cognition and mobility. Gait quality is poor with knees flexed in stance and deteriorates quickly with fatigue making him a very high risk for fall. Daughter reports pt has been waiting to see someone for his back pain and is supposed to have an MRI in a few weeks. Wonder if leg weakness is related to back pain as well as deconditioning.    Follow Up Recommendations  SNF     Equipment Recommendations  Wheelchair (measurements PT);Wheelchair cushion (measurements PT)    Recommendations for Other Services       Precautions / Restrictions Precautions Precautions: Fall Precaution Comments: son reports about 5 falls in the past 3 months    Mobility  Bed Mobility Overal bed mobility: Needs Assistance Bed Mobility: Rolling;Sidelying to Sit;Sit to Sidelying Rolling: Min assist Sidelying to sit: Mod assist;HOB elevated     Sit to sidelying: Mod assist General bed mobility comments: Assist to bring legs off of bed and elevate trunk into sitting. Assist to bring legs back up into bed returning to sidelying    Transfers Overall transfer level: Needs assistance Equipment used: Rolling walker (2 wheeled) Transfers: Sit to/from Stand Sit to Stand: Mod assist;+2 safety/equipment         General transfer comment: Assist to bring hips up and for balance. Verbal cues to scoot to the edge and for hand placement.  Ambulation/Gait Ambulation/Gait assistance: Mod assist;+2 safety/equipment Gait Distance (Feet): 8 Feet (x 2) Assistive device: Rolling walker (2  wheeled) Gait Pattern/deviations: Step-through pattern;Decreased stride length;Trunk flexed Gait velocity: decr Gait velocity interpretation: <1.31 ft/sec, indicative of household ambulator General Gait Details: Assist for balance and support. Pt with bilateral knees flexed in stance and some hyperextension in rt.   Stairs             Wheelchair Mobility    Modified Rankin (Stroke Patients Only)       Balance Overall balance assessment: Needs assistance;History of Falls Sitting-balance support: Feet supported;Bilateral upper extremity supported Sitting balance-Leahy Scale: Poor Sitting balance - Comments: Sat EOB with supervision with BUE support   Standing balance support: Bilateral upper extremity supported Standing balance-Leahy Scale: Poor Standing balance comment: walker and min assist for static standing                            Cognition Arousal/Alertness: Awake/alert;Lethargic (Sleepy but aroused and stayed awake if stimulated. Quickly back to sleep if not stimulated) Behavior During Therapy: WFL for tasks assessed/performed Overall Cognitive Status: Impaired/Different from baseline Area of Impairment: Orientation;Attention;Memory;Following commands;Safety/judgement;Problem solving                 Orientation Level: Disoriented to;Situation Current Attention Level: Sustained Memory: Decreased short-term memory Following Commands: Follows one step commands with increased time Safety/Judgement: Decreased awareness of safety   Problem Solving: Requires verbal cues;Requires tactile cues;Slow processing;Difficulty sequencing        Exercises      General Comments General comments (skin integrity, edema, etc.): daughter present      Pertinent Vitals/Pain Pain Assessment: Faces  Faces Pain Scale: Hurts little more Pain Location: Shoulders with any touch Pain Descriptors / Indicators: Guarding;Grimacing Pain Intervention(s): Limited  activity within patient's tolerance    Home Living                      Prior Function            PT Goals (current goals can now be found in the care plan section) Acute Rehab PT Goals Patient Stated Goal: become mobile Progress towards PT goals: Progressing toward goals    Frequency    Min 2X/week      PT Plan Current plan remains appropriate    Co-evaluation              AM-PAC PT "6 Clicks" Mobility   Outcome Measure  Help needed turning from your back to your side while in a flat bed without using bedrails?: A Little Help needed moving from lying on your back to sitting on the side of a flat bed without using bedrails?: A Lot Help needed moving to and from a bed to a chair (including a wheelchair)?: A Lot Help needed standing up from a chair using your arms (e.g., wheelchair or bedside chair)?: A Lot Help needed to walk in hospital room?: A Lot Help needed climbing 3-5 steps with a railing? : Total 6 Click Score: 12    End of Session Equipment Utilized During Treatment: Gait belt Activity Tolerance: Patient limited by fatigue Patient left: in bed;with call bell/phone within reach;with bed alarm set;with family/visitor present Nurse Communication: Mobility status;Need for lift equipment (nurse tech) PT Visit Diagnosis: Other abnormalities of gait and mobility (R26.89);Muscle weakness (generalized) (M62.81);History of falling (Z91.81)     Time: 0930-1000 PT Time Calculation (min) (ACUTE ONLY): 30 min  Charges:  $Therapeutic Activity: 23-37 mins                     [place] Pager [phone] Office [place] 12/23/2020, 2:18 PM

## 2020-12-23 NOTE — Interval H&P Note (Signed)
History and Physical Interval Note:  12/23/2020 11:56 AM  [name] [name]  has presented today for surgery, with the diagnosis of gastric foreign body.  The various methods of treatment have been discussed with the patient and family. After consideration of risks, benefits and other options for treatment, the patient has consented to  Procedure(s): ESOPHAGOGASTRODUODENOSCOPY (EGD) (N/A) as a surgical intervention.  The patient's history has been reviewed, patient examined, no change in status, stable for surgery.  I have reviewed the patient's chart and labs.  Questions were answered to the patient's satisfaction.     [place] for attempted endoscopic removal of hearing aid.  Risks, including perforation (which could have catastrophic implications), reviewed with patient.  Patient cleared from cardiology for procedure, hyponatremia improved.  Risks (bleeding, infection, bowel perforation that could require surgery, sedation-related changes in cardiopulmonary systems), benefits (identification and possible treatment of source of symptoms, exclusion of certain causes of symptoms), and alternatives (watchful waiting, radiographic imaging studies, empiric medical treatment) of upper endoscopy (EGD) were explained to patient/family in detail and patient wishes to proceed.

## 2020-12-24 ENCOUNTER — Inpatient Hospital Stay (HOSPITAL_COMMUNITY): Payer: Medicare Other

## 2020-12-24 DIAGNOSIS — S2243XA Multiple fractures of ribs, bilateral, initial encounter for closed fracture: Secondary | ICD-10-CM | POA: Diagnosis not present

## 2020-12-24 LAB — BASIC METABOLIC PANEL
Anion gap: 13 (ref 5–15)
BUN: 86 mg/dL — ABNORMAL HIGH (ref 8–23)
CO2: 22 mmol/L (ref 22–32)
Calcium: 9.7 mg/dL (ref 8.9–10.3)
Chloride: 93 mmol/L — ABNORMAL LOW (ref 98–111)
Creatinine, Ser: 4.54 mg/dL — ABNORMAL HIGH (ref 0.61–1.24)
GFR, Estimated: 12 mL/min — ABNORMAL LOW (ref >60)
Glucose, Bld: 167 mg/dL — ABNORMAL HIGH (ref 70–99)
Potassium: 3.7 mmol/L (ref 3.5–5.1)
Sodium: 128 mmol/L — ABNORMAL LOW (ref 135–145)

## 2020-12-24 LAB — CBC
HCT: 25.9 % — ABNORMAL LOW (ref 39.0–52.0)
Hemoglobin: 9 g/dL — ABNORMAL LOW (ref 13.0–17.0)
MCH: 30.9 pg (ref 26.0–34.0)
MCHC: 34.7 g/dL (ref 30.0–36.0)
MCV: 89 fL (ref 80.0–100.0)
Platelets: 157 10*3/uL (ref 150–400)
RBC: 2.91 MIL/uL — ABNORMAL LOW (ref 4.22–5.81)
RDW: 15.1 % (ref 11.5–15.5)
WBC: 10.7 10*3/uL — ABNORMAL HIGH (ref 4.0–10.5)
nRBC: 0 % (ref 0.0–0.2)

## 2020-12-24 IMAGING — CR DG ABDOMEN 2V
2 series · 2 of 2 positions shown · non-contrast
Comparison: [DATE] and [DATE]

CLINICAL DATA: Apparent foreign body in elementary tract

EXAM:
ABDOMEN - 2 VIEW

[abdomen erect]
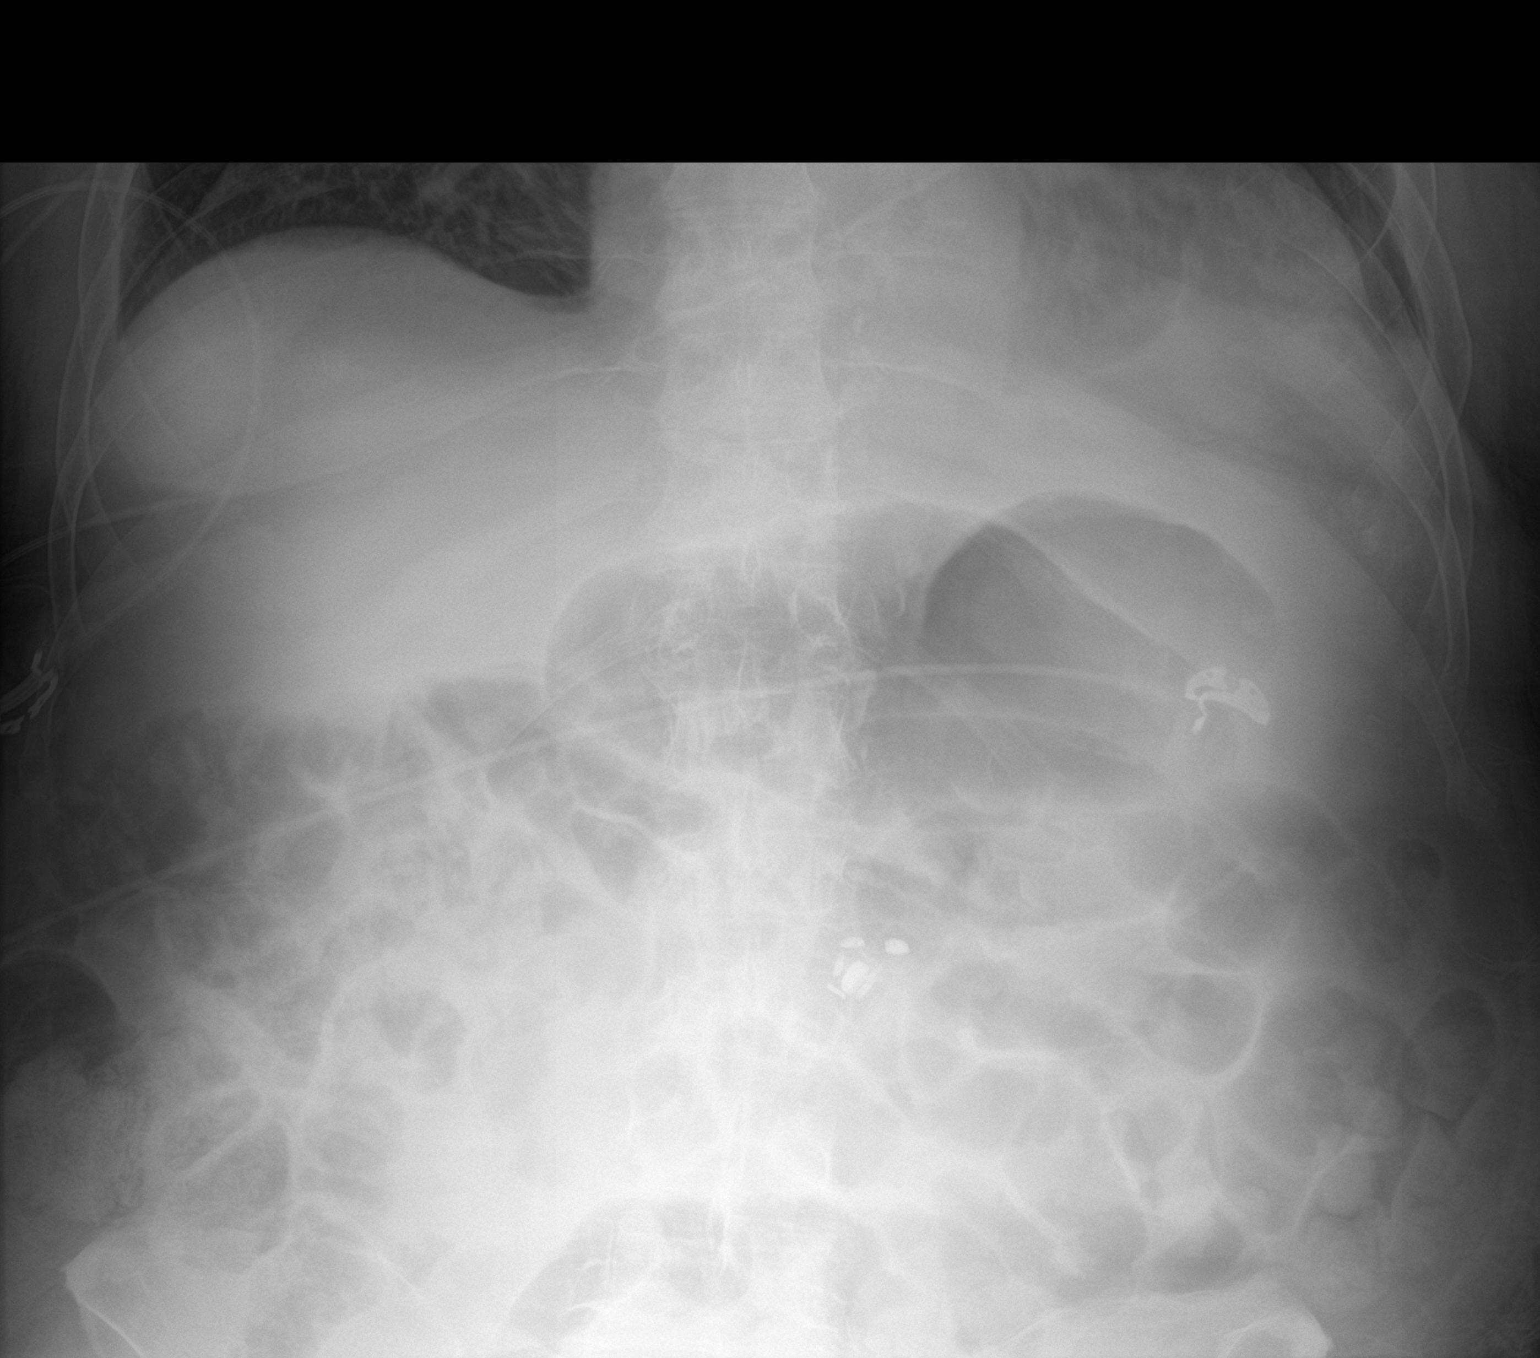

[abdomen supine]
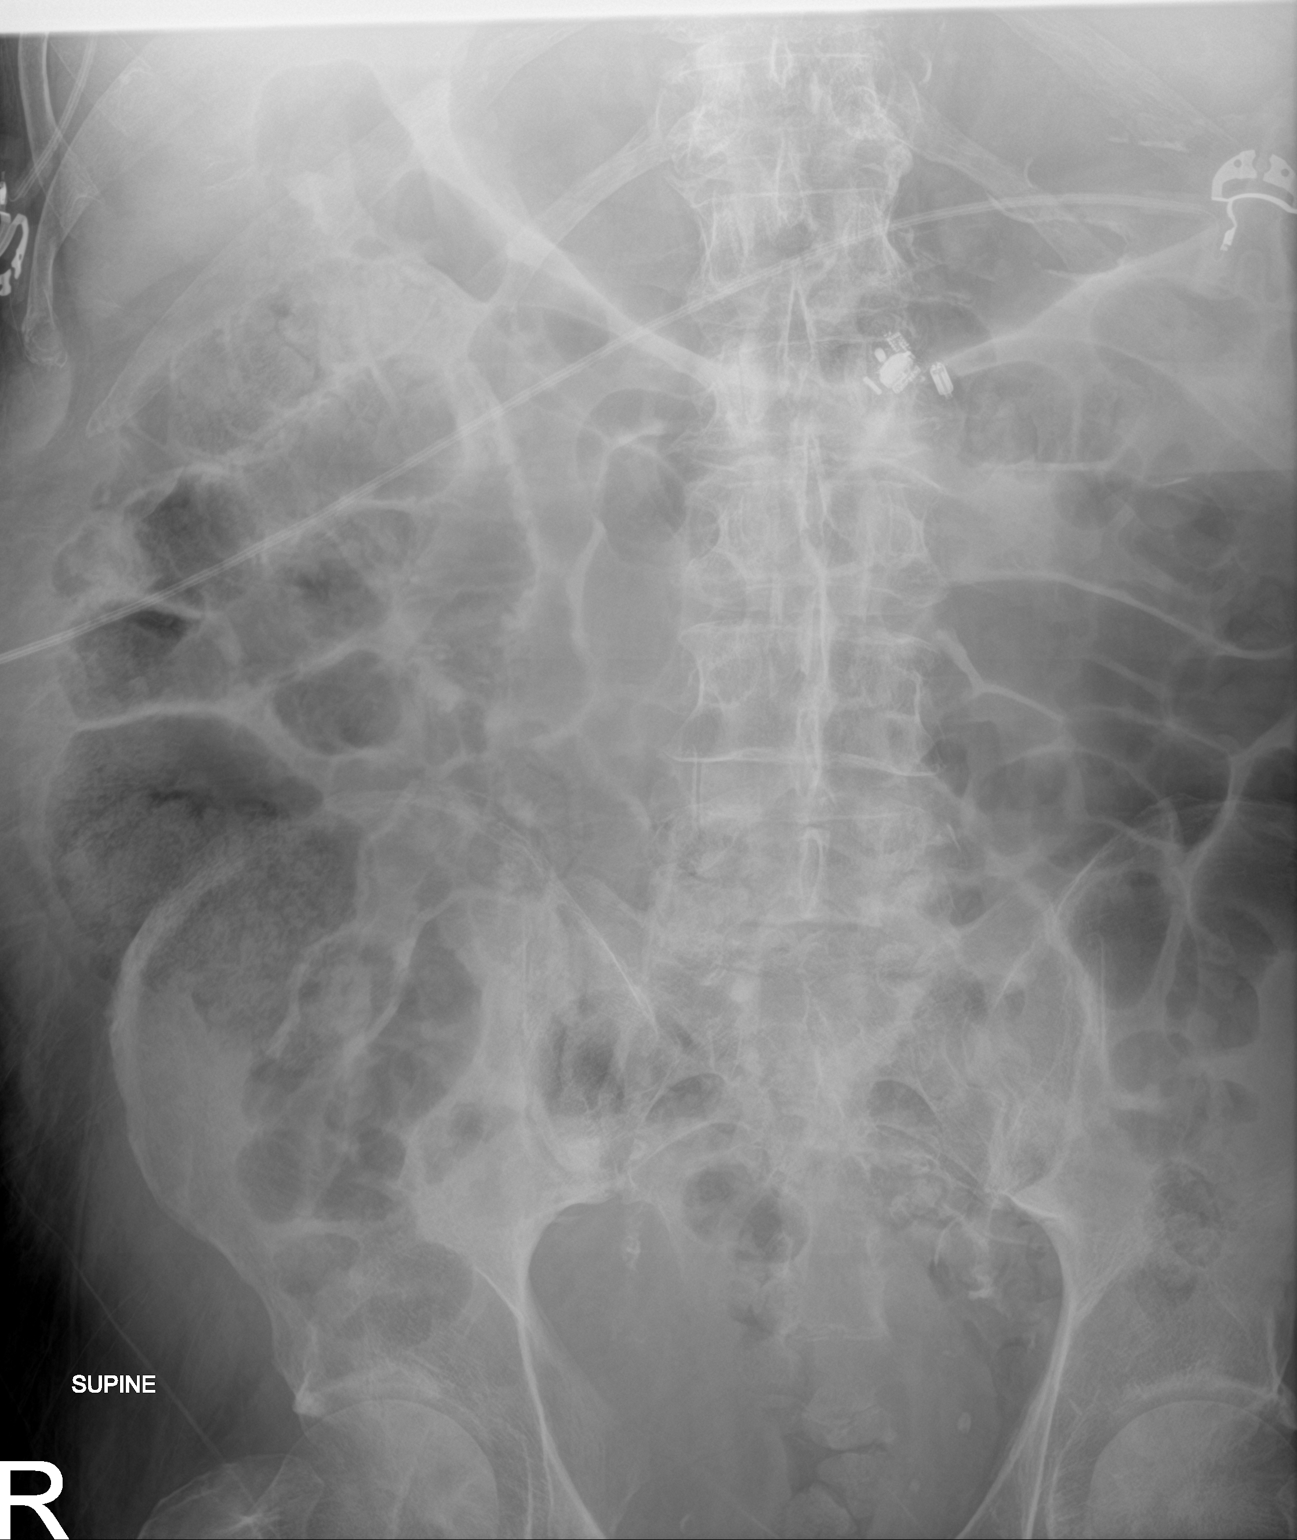

[2 of 2 positions shown; findings below may reference images not displayed]

FINDINGS: Supine and upright images obtained. There is again noted a metallic
foreign body, likely a hearing assist device slightly to the left of
midline, unchanged in position, likely in the transverse colon.

There is air throughout bowel. There is no appreciable bowel
dilatation or air-fluid level to suggest bowel obstruction. No
evident free air. Seed implants are noted in the prostate region.
IMPRESSION: Apparent hearing assist device in mid abdominal regions slightly to
the left of midline, likely in transverse colon, unchanged in
position since 2 days prior. No bowel obstruction or free air is
evident. Seed implants in prostate.

Comment: This radiopaque foreign body appears on current examination
to be located inferior to the stomach.

## 2020-12-24 MED ORDER — POLYETHYLENE GLYCOL 3350 17 G PO PACK
17.0000 g | PACK | Freq: Two times a day (BID) | ORAL | Status: DC
  Administered 2020-12-24 – 2020-12-31 (×13): 17 g via ORAL
  Filled 2020-12-24 (×15): qty 1

## 2020-12-24 MED ORDER — PSYLLIUM 95 % PO PACK
1.0000 | PACK | Freq: Three times a day (TID) | ORAL | Status: DC
  Administered 2020-12-24 – 2020-12-31 (×14): 1 via ORAL
  Filled 2020-12-24 (×25): qty 1

## 2020-12-24 MED ORDER — CARVEDILOL 12.5 MG PO TABS
12.5000 mg | ORAL_TABLET | Freq: Two times a day (BID) | ORAL | Status: DC
  Administered 2020-12-24 – 2020-12-25 (×2): 12.5 mg via ORAL
  Filled 2020-12-24 (×2): qty 1

## 2020-12-24 NOTE — Progress Notes (Signed)
[facility] Progress Note    Assessment/ Plan:   1.  Presumed AKI on CKD 3: Baseline Cr from 12/2019 was 3.8 from PCP records.  No values since then so possibly could have progressed since then.  CT scan shows bilateral complex renal cysts but no obstruction.  No hypotensive episodes unless falls were vagal/ syncopal events.  UA unremarkable at present.  Priority will be getting volume off and improving Na.  Would hold cozaar. Continue IV Lasix. No indication for dialysis at present.  Cr is stable at 4.4, ? Current baseline  2.  Hyponatremia: appears to be hypervolemic hyponatremia in the setting of too much fluid.  Improving, continue IV Lasix at increased dose of 80 IV BID.  1200 mL fluid restriction  3.  Acute on chronic CHF exacerbation: cardiology on board- appreciate assistance.  Norvasc stopped and Coreg decreased in setting of lower BP overnight.    4.  Acute encephalopathy/ falls- may be precipitated by the hyponatremia.  Mental status is better today  5. Rib fractures/ facial trauma- supportive care, please avoid morphine in CKD pt  6.  Retained hearing Aid in stomach- GI following, S/P egd 12/23/20, did not find, for abd xray today  7.  Afib: Eliquis on hold for now  Subjective:    Na up to 128 today.  Some lower BP in the 90s overnight- primary has already stopped Norvasc and decreased Coreg- agree.  For abd xray today to see where hearing aid is   Objective:   BP (!) 96/57 (BP Location: Left Arm)   Pulse 63   Temp (!) 97.5 F (36.4 C) (Oral)   Resp 17   Ht 6' (1.829 m)   Wt 95.4 kg   SpO2 96%   BMI 28.52 kg/m   Intake/Output Summary (Last 24 hours) at 12/24/2020 1118 Last data filed at 12/24/2020 0846 Gross per 24 hour  Intake 620 ml  Output 2750 ml  Net -2130 ml   Weight change:   Physical Exam: GEN NAD, lying in bed HEENT EOMI PERRL,MMM NECK no JVD, improved however PULM muffled bibasilar breath sounds CV irregular ABD soft EXT trace LE  edema NEURO more awake than yesterday SKIN tanned  Imaging: DG Abd Portable 1V  Result Date: 12/23/2020 CLINICAL DATA:  Gastric foreign body, may have swallowed hearing aid EXAM: PORTABLE ABDOMEN - 1 VIEW COMPARISON:  12/22/2020; correlation CT chest abdomen 12/21/2020 FINDINGS: Electronic components of a foreign body again identified LEFT paraspinal at L2-L3, unchanged. This projects over gastric antrum and transverse colon. Bowel gas pattern normal. Brachytherapy seed implants at prostate bed. Bones demineralized with degenerative changes lumbar spine. No urinary tract calcification. Atherosclerotic calcifications in the iliac systems. IMPRESSION: Persistent electronic foreign body LEFT paraspinal at L2-3 consistent with ingested hearing aid. This projects over the gastric antrum and the transverse colon and could be within either, though the lack of interval change in position since the previous study suggests gastric retention. Electronically Signed   By: Lavonia Dana M.D.   On: 12/23/2020 08:45   ECHOCARDIOGRAM COMPLETE  Result Date: 12/22/2020    ECHOCARDIOGRAM REPORT   Patient Name:   [name] [name] Date of Exam: 12/22/2020 Medical Rec #:  [id number]     Height:       72.0 in Accession #:    [id number]    Weight:       253.5 lb Date of Birth:  09/04/1933    BSA:  2.357 m Patient Age:    87 years      BP:           121/69 mmHg Patient Gender: M             HR:           51 bpm. Exam Location:  Inpatient Procedure: 3D Echo, 2D Echo, Cardiac Doppler and Color Doppler Indications:    I50.40* Unspecified combined systolic (congestive) and diastolic                 (congestive) heart failure  History:        Patient has prior history of Echocardiogram examinations, most                 recent 11/24/2011. CAD and Previous Myocardial Infarction,                 Abnormal ECG, Arrythmias:Atrial Fibrillation,                 Signs/Symptoms:Chest Pain; Risk Factors:Hypertension.  Sonographer:    Roseanna Rainbow RDCS  Referring Phys: [id number] RONDELL A SMITH  Sonographer Comments: Technically difficult study due to poor echo windows. IMPRESSIONS  1. Left ventricular ejection fraction, by estimation, is 55 to 60%. The left ventricle has normal function. The left ventricle has no regional wall motion abnormalities. There is moderate left ventricular hypertrophy. Left ventricular diastolic parameters are indeterminate.  2. Right ventricular systolic function is normal. The right ventricular size is normal. There is normal pulmonary artery systolic pressure.  3. Left atrial size was moderately dilated.  4. Right atrial size was mildly dilated.  5. The pericardial effusion is anterior to the right ventricle.  6. The mitral valve is degenerative. Mild mitral valve regurgitation. No evidence of mitral stenosis. Moderate mitral annular calcification.  7. The aortic valve is tricuspid. There is moderate calcification of the aortic valve. Aortic valve regurgitation is not visualized. Mild to moderate aortic valve sclerosis/calcification is present, without any evidence of aortic stenosis.  8. There is mild dilatation of the ascending aorta, measuring 38 mm.  9. The inferior vena cava is normal in size with greater than 50% respiratory variability, suggesting right atrial pressure of 3 mmHg. FINDINGS  Left Ventricle: Left ventricular ejection fraction, by estimation, is 55 to 60%. The left ventricle has normal function. The left ventricle has no regional wall motion abnormalities. The left ventricular internal cavity size was normal in size. There is  moderate left ventricular hypertrophy. Left ventricular diastolic parameters are indeterminate. Right Ventricle: The right ventricular size is normal. No increase in right ventricular wall thickness. Right ventricular systolic function is normal. There is normal pulmonary artery systolic pressure. The tricuspid regurgitant velocity is 2.46 m/s, and  with an assumed right atrial pressure of 8  mmHg, the estimated right ventricular systolic pressure is 19.6 mmHg. Left Atrium: Left atrial size was moderately dilated. Right Atrium: Right atrial size was mildly dilated. Pericardium: Trivial pericardial effusion is present. The pericardial effusion is anterior to the right ventricle. Mitral Valve: The mitral valve is degenerative in appearance. There is mild thickening of the mitral valve leaflet(s). There is mild calcification of the mitral valve leaflet(s). Moderate mitral annular calcification. Mild mitral valve regurgitation. No evidence of mitral valve stenosis. Tricuspid Valve: The tricuspid valve is normal in structure. Tricuspid valve regurgitation is trivial. No evidence of tricuspid stenosis. Aortic Valve: The aortic valve is tricuspid. There is moderate calcification of the aortic valve. Aortic valve  regurgitation is not visualized. Mild to moderate aortic valve sclerosis/calcification is present, without any evidence of aortic stenosis. Pulmonic Valve: The pulmonic valve was normal in structure. Pulmonic valve regurgitation is not visualized. No evidence of pulmonic stenosis. Aorta: The aortic root is normal in size and structure. There is mild dilatation of the ascending aorta, measuring 38 mm. Venous: The inferior vena cava is normal in size with greater than 50% respiratory variability, suggesting right atrial pressure of 3 mmHg. IAS/Shunts: No atrial level shunt detected by color flow Doppler.  LEFT VENTRICLE PLAX 2D LVIDd:         4.90 cm      Diastology LVIDs:         3.50 cm      LV e' medial:   7.07 cm/s LV PW:         1.40 cm      LV E/e' medial: 8.8 LV IVS:        1.50 cm LVOT diam:     2.50 cm LV SV:         131 LV SV Index:   56 LVOT Area:     4.91 cm  LV Volumes (MOD) LV vol d, MOD A2C: 65.0 ml LV vol d, MOD A4C: 113.0 ml LV vol s, MOD A2C: 37.2 ml LV vol s, MOD A4C: 63.2 ml LV SV MOD A2C:     27.8 ml LV SV MOD A4C:     113.0 ml LV SV MOD BP:      39.3 ml RIGHT VENTRICLE              IVC RV S prime:     11.30 cm/s  IVC diam: 3.10 cm TAPSE (M-mode): 2.5 cm LEFT ATRIUM             Index       RIGHT ATRIUM           Index LA diam:        4.30 cm 1.82 cm/m  RA Area:     27.90 cm LA Vol (A2C):   95.6 ml 40.57 ml/m RA Volume:   87.20 ml  37.00 ml/m LA Vol (A4C):   79.0 ml 33.52 ml/m LA Biplane Vol: 90.6 ml 38.45 ml/m  AORTIC VALVE LVOT Vmax:   119.00 cm/s LVOT Vmean:  78.400 cm/s LVOT VTI:    0.267 m  AORTA Ao Root diam: 3.60 cm Ao Asc diam:  3.80 cm MITRAL VALVE               TRICUSPID VALVE MV Area (PHT): 4.68 cm    TR Peak grad:   24.2 mmHg MV Decel Time: 162 msec    TR Vmax:        246.00 cm/s MV E velocity: 62.35 cm/s                            SHUNTS                            Systemic VTI:  0.27 m                            Systemic Diam: 2.50 cm Jenkins Rouge MD Electronically signed by Jenkins Rouge MD Signature Date/Time: 12/22/2020/4:14:48 PM    Final     Labs: BMET Recent Labs  Lab 12/21/20 0007 12/21/20  0200 12/21/20 0848 12/21/20 1921 12/22/20 0754 12/23/20 0329 12/24/20 0338  NA 122* 122* 121* 123* 126* 125* 128*  K 4.9 4.8 4.6 4.3 4.1 3.7 3.7  CL 92* 92* 91* 91* 94* 92* 93*  CO2 20* 18* 20* 23 20* 22 22  GLUCOSE 118* 107* 86 110* 96 98 167*  BUN 80* 80* 79* 77* 80* 80* 86*  CREATININE 4.45* 4.32* 4.36* 4.43* 4.43* 4.35* 4.54*  CALCIUM 10.8* 10.5* 10.0 9.9 9.9 9.5 9.7  PHOS  --   --   --  5.9*  --   --   --    CBC Recent Labs  Lab 12/21/20 0007 12/22/20 0754 12/23/20 0504 12/24/20 0338  WBC 10.3 12.4* 9.1 10.7*  NEUTROABS 8.3*  --   --   --   HGB 8.7* 9.2* 8.6* 9.0*  HCT 25.5* 27.1* 25.0* 25.9*  MCV 91.1 90.0 88.7 89.0  PLT 140* 141* 135* 157    Medications:    . allopurinol  100 mg Oral Daily  . aspirin EC  81 mg Oral Daily  . carvedilol  12.5 mg Oral BID WC  . furosemide  80 mg Intravenous BID  . latanoprost  1 drop Both Eyes QHS  . lidocaine  1 patch Transdermal Q24H  . lidocaine  1 patch Transdermal Q24H  . pantoprazole  40 mg Oral  BID AC  . rosuvastatin  5 mg Oral Q M,W,F  . sodium chloride flush  3 mL Intravenous Q12H      Madelon Lips, MD 12/24/2020, 11:18 AM

## 2020-12-24 NOTE — TOC Initial Note (Signed)
Transition of Care [facility]) - Initial/Assessment Note    Patient Details  Name: [name] [name] MRN: [id number] Date of Birth: 01-Dec-1932  Transition of Care [facility]) CM/SW Contact:    Emeterio Reeve, [place] Phone Number: 12/24/2020, 10:10 AM  Clinical Narrative:                  CSW spoke to patients daughter Olin Hauser via phone. Pt is oriented to person and place only. Olin Hauser stated pt was living in [place] about a month ago and she moved him to her house because he required more support. Olin Hauser report for about the last 6 weeks pt has become weaker and started using a walker. Olin Hauser reports he is often unsteady on his feet and loses balance and falls. Pt requires help with ADL's. Olin Hauser reports pt is covid vaccinated with booster.   CSW explained pt/ot reccs of SNF. Olin Hauser stated she is ok with that if he needs it. Olin Hauser reports she does not have a specific facility in mind and gave CSW permission to fax out to facilities in the area. CSW instructed Olin Hauser to view facilities in her zip code on the medicare.gov website.   CSW will continue to follow.   Expected Discharge Plan: Skilled Nursing Facility Barriers to Discharge: Continued Medical Work up   Patient Goals and CMS Choice Patient states their goals for this hospitalization and ongoing recovery are:: To walk on own again CMS Medicare.gov Compare Post Acute Care list provided to:: Patient Choice offered to / list presented to : [place] / Guardian  Expected Discharge Plan and Services Expected Discharge Plan: [place] arrangements for the past 2 months: Single Family Home                                      Prior Living Arrangements/Services Living arrangements for the past 2 months: Single Family Home Lives with:: Adult Children Patient language and need for interpreter reviewed:: Yes Do you feel safe going back to the place where you live?: Yes      Need for Family Participation in Patient  Care: Yes (Comment) Care giver support system in place?: Yes (comment) Current home services: DME Criminal Activity/Legal Involvement Pertinent to Current Situation/Hospitalization: No - Comment as needed  Activities of Daily Living      Permission Sought/Granted Permission sought to share information with : Family Chief Financial Officer Permission granted to share information with : Yes, Verbal Permission Granted     Permission granted to share info w AGENCY: SNF  Permission granted to share info w Relationship: Daughter, Olin Hauser     Emotional Assessment Appearance:: Appears stated age Attitude/Demeanor/Rapport: Engaged Affect (typically observed): Appropriate Orientation: : Oriented to Self,Oriented to Place Alcohol / Substance Use: Not Applicable Psych Involvement: No (comment)  Admission diagnosis:  Hyponatremia [E87.1] Trauma [T14.90XA] Pain [R52] Closed head injury, initial encounter [S09.90XA] Strain of neck muscle, initial encounter [S16.1XXA] Acute renal failure, unspecified acute renal failure type ([place]) [N17.9] Closed fracture of multiple ribs of both sides, initial encounter [S22.43XA] Patient Active Problem List   Diagnosis Date Noted  . Hyponatremia 12/21/2020  . Multiple fractures of ribs, bilateral, initial encounter for closed fracture 12/21/2020  . Falls 12/21/2020  . Acute kidney injury superimposed on chronic kidney disease ([place]) 12/21/2020  . Foreign body in stomach 12/21/2020  . Iron deficiency anemia 12/21/2020  . Obesity (  BMI 30-39.9) 12/21/2020  . Hypercalcemia 12/21/2020  . CAD (coronary artery disease), stent placed 11/26/11  11/27/2011  . S/P angioplasty with stent, 11/26/11 distal AVGroove LCX into OM3 with BMS ([facility]) and rescue PTCA on OM2 jailed by stent. 11/27/2011  . NSTEMI (non-ST elevated myocardial infarction) most likely as outpatient 11/24/2011  . CKD (chronic kidney disease) stage 3, GFR 30-59 ml/min (HCC)  11/24/2011  . Chest pain 11/23/2011  . HTN (hypertension) 11/23/2011  . A-fib, New onset 11/23/2011  . Acid reflux    PCP:  Antony Contras, MD Pharmacy:   [facility] [place], [place] - [address] AT [address] [place] [address] Phone: [phone] Fax: [phone]     Social Determinants of Health (SDOH) Interventions    Readmission Risk Interventions No flowsheet data found.  Emeterio Reeve, Latanya Presser, [place] Social Worker [phone]

## 2020-12-24 NOTE — Progress Notes (Signed)
Notified MD [place] that pt's BP was 96/57 MAP 70 and HR 68- if ok to hold coreg, norvasc and lasix IV. Per MD he changed parameters for Norvasc and coreg medications.    Paulla Fore, RN, BSN

## 2020-12-24 NOTE — Progress Notes (Addendum)
PROGRESS NOTE    [name] [name]  LGX:[id number] DOB: 1933-01-02 DOA: 12/20/2020 PCP: Antony Contras, MD   Chief Complaint  Patient presents with  . Fall    Taking Eliquis  Brief Narrative:  87 old male with history of hypertension, A. fib on Eliquis, CAD history of PCI in 8144, chronic systolic CHF EF 81-85'U,DJSH, chronic kidney disease stage III brought to the ED after fall at home.  He has had recent falls and while at the shower on 4/5 he fell.  Patient reported that he lost balance. Per report denies any loss of consciousness or trauma to his head. He has been ambulating with use of a walker.  Apparently patient has had severe back pain and sciatica for which he has been seen by physical therapy and has a scheduled appointment with orthopedics.  Due to the pain in his back he has not been sleeping well at night  In the ED he was afebrile, saturating well on room air, CT scan of the head and cervical spine showed no acute abnormality, but did note a partially visualized right-sided pleural effusion.  Labs significant for for hemoglobin 8.7, platelets 140, sodium 122, chloride 92, CO2 20, BUN 80, creatinine 4.45, calcium 10.8, AST 63, serum osmolarity 287, and TSH 2.577.  Urinalysis was positive for 30 mg/dL protein, but showed no significant signs of infection.  CT scan of the chest for bilateral nondisplaced rib fractures, a metallic foreign body in the gastric body measuring 2.1 cm possibly hearing aid, possible duodenitis/peptic ulcer disease, cardiomegaly with signs of fluid overload with pulmonary edema, and a 4.2 cm ascending aortic aneurysm.  patient was given normal saline IVFs at 75 ml/hr, Patient was admitted and nephrology, gastroenterology and cardiology were consulted. Patient became lethargic after getting morphine in the ED. Patient was followed by GI nephrology cardiology. Underwent EGD that showed stomach ulcer no foreign body  Subjective: Seen this morning.  Patient reports  his pain is controlled but unmoving he does get pain shooting pain from lower back to the leg and also pain in his shoulders and upper back. His daughter is at the bedside.  He appears alert awake at baseline.  Assessment & Plan:  Multiple falls at home Chronic low back-scheduled to see [facility] 4/18 per son. He has had at least 4  Falls at home  likely multifactorial in setting of hyponatremia and fluid overload chronic low back pain and physical deconditioning.  Continue supportive measures PT OT, pain control.  Can consider MRI of the spine if able to retrieve the foreign body from the stoma w/ EGD or he will need to follow-up with scheduled outpatient follow-up.  Multiple fractures of ribs,bilateral,due to fall:Continue incentive spirometry, lidocaine patch, pain control with Tylenol-patient family reluctant to use narcotics given patient was lethargic after getting morphine in the ED. pain appears overall stable.  Acute encephalopathy following IV morphine in the ED family reluctant about opiates.appears resolved.  Minimize narcotic use.  I have added tramadol 25 mg for severe pain .  Hyponatremia: Suspect hypervolemic hyponatremia -sodium nicely improving with diuresis and 1.2 L fluid restriction.  Appreciate nephrology input.   Recent Labs  Lab 12/21/20 0848 12/21/20 1921 12/22/20 0754 12/23/20 0329 12/24/20 0338  NA 121* 123* 126* 125* 128*   AKI on CKD 3-PCP records shows baseline creatinine 4/21 was 3.8.  No value since then, possibly could have progressed since.  Nephrology on board and appreciate input.  Creatinine remains stable at 4.4 ranges unclear if  it is his baseline.  Continue diuresis per nephro.CT shows bilateral complex renal cyst.  Recent Labs  Lab 12/21/20 0848 12/21/20 1921 12/22/20 0754 12/23/20 0329 12/24/20 0338  BUN 79* 77* 80* 80* 86*  CREATININE 4.36* 4.43* 4.43* 4.35* 4.54*  Net IO Since Admission: -6,680 mL [12/24/20 1143]  Intake/Output  Summary (Last 24 hours) at 12/24/2020 1143 Last data filed at 12/24/2020 0846 Gross per 24 hour  Intake 620 ml  Output 2750 ml  Net -2130 ml   Foreign body in  Gi TRACT/duodenitis/PUD/nonbleeding gastric ulcer in  EGD 4/7 but no foreign body was seen or retrieved.  GI advised to hold anticoagulation/Eliquis for at least 5 days.  Continue PPI twice daily.  Following closely with serial daily abdominal x-ray.  X-ray report pending today  Iron deficiency anemia:guaiac was noted to be negative.  Hemoglobin remains overall stable.  Monitor.  Recent Labs  Lab 12/21/20 0007 12/22/20 0754 12/23/20 0504 12/24/20 0338  HGB 8.7* 9.2* 8.6* 9.0*  HCT 25.5* 27.1* 25.0* 25.9*   HTN: Blood pressure soft hypotensive I have stopped amlodipine and cut down Coreg to half and added holding parameters.  Monitor closely, continue diuresis as per nephrology.    CAD S/P angioplasty 2013:trop neg 14>13.continue his aspirin, beta-blocker and statin.  Acute on chronic CHF:BNP elevated hypervolemic.echo shows normal LV function suspect CHF with preserved EF.  Continue IV diuretic Lasix as per nephrology.  Monitor intake output Daily weight.  Net IO Since Admission: -6,680 mL [12/24/20 1149]   Permanent A. fib on Eliquis now with multiple falls rate controlled on Coreg. Eliquis held for EGD and given patient's stomach ulcer GI advised to hold for 5 days.  Resume as soon as okay with GI. Patient is here for temporarily.Is followed by cardiologist in [place].  Thrombocytopenia: Improved..  Recent Labs  Lab 12/21/20 0007 12/22/20 0754 12/23/20 0504 12/24/20 0338  PLT 140* 141* 135* 157   Hypercalcemia: Calcium level is stable 9.9.  Hyperlipidemia continue statin.   MorBid Obesity BMI 34: Patient will benefit with weight loss PCP follow-up.  Diet Order            DIET SOFT Room service appropriate? Yes; Fluid consistency: Thin; Fluid restriction: 1200 mL Fluid  Diet effective now               Patient's  Body mass index is 28.52 kg/m.  DVT prophylaxis: SCD.  Anticoagulation on hold in the setting of possible GI procedure Code Status:   Code Status: Full Code  Family Communication: plan of care discussed with patient and his son at bedside previously. Discussed with patient's daughter at the bedside again today.  Status is: Inpatient Remains inpatient appropriate because:IV treatments appropriate due to intensity of illness or inability to take PO and Inpatient level of care appropriate due to severity of illness  Dispo: The patient is from: Home              Anticipated d/c is to: Skilled nursing facility              Patient currently is not medically stable to d/c.   Difficult to place patient No   Unresulted Labs (From admission, onward)          Start     Ordered   12/23/20 0500  CBC  Daily,   R     Question:  Specimen collection method  Answer:  Lab=Lab collect   12/22/20 0829   12/22/20 2355  Basic metabolic  panel  Daily,   R      12/22/20 0645        Medications reviewed:  Scheduled Meds: . allopurinol  100 mg Oral Daily  . aspirin EC  81 mg Oral Daily  . carvedilol  12.5 mg Oral BID WC  . furosemide  80 mg Intravenous BID  . latanoprost  1 drop Both Eyes QHS  . lidocaine  1 patch Transdermal Q24H  . lidocaine  1 patch Transdermal Q24H  . pantoprazole  40 mg Oral BID AC  . polyethylene glycol  17 g Oral BID  . psyllium  1 packet Oral TID  . rosuvastatin  5 mg Oral Q M,W,F  . sodium chloride flush  3 mL Intravenous Q12H   Continuous Infusions: . sodium chloride      Consultants: Nephrology, cardiology GI   Procedures:see note EGD Medium-sized hiatal hernia. - Non-bleeding gastric ulcer with no stigmata of bleeding. - Gastritis. - Duodenitis. - No foreign body seen to the extent of our examination (D2). Impression: None Moderate Sedation: - Return patient to hospital ward for ongoing care. - Soft diet today. - Continue present medications. - Based  on finding of ulcer, would hold Apixaban for another 5 days, if at all possible. - Pantoprazole 40 mg po bid x 6 weeks, then 40 mg po qd thereafter indefinitely. - Perform an H. pylori serology tomorrow. - Miralax 17 grams po bid, to facilitate passage of foreign body. - Serial (daily) abdominal xrays. - [facility] will follow  Antimicrobials: Anti-infectives (From admission, onward)   None     Culture/Microbiology No results found for: SDES, SPECREQUEST, CULT, REPTSTATUS  Other culture-see note  Objective: Vitals: Today's Vitals   12/23/20 2235 12/23/20 2318 12/24/20 0357 12/24/20 0953  BP:   132/75 (!) 96/57  Pulse:   67 63  Resp:   16 17  Temp:   97.6 F (36.4 C) (!) 97.5 F (36.4 C)  TempSrc:   Oral Oral  SpO2:   92% 96%  Weight:   95.4 kg   Height:      PainSc: 5  2       Intake/Output Summary (Last 24 hours) at 12/24/2020 1143 Last data filed at 12/24/2020 0846 Gross per 24 hour  Intake 620 ml  Output 2750 ml  Net -2130 ml   Filed Weights   12/20/20 2325 12/24/20 0357  Weight: 115 kg 95.4 kg   Weight change:   Intake/Output from previous day: 04/08 0701 - 04/09 0700 In: 380 [P.O.:380] Out: 3300 [Urine:3300] Intake/Output this shift: Total I/O In: 240 [P.O.:240] Out: 450 [Urine:450] Filed Weights   12/20/20 2325 12/24/20 0357  Weight: 115 kg 95.4 kg    Examination: General exam: AAOx3, elderly frail on room air, NAD, weak appearing. HEENT:Oral mucosa moist, Ear/Nose WNL grossly, dentition normal. Respiratory system: bilaterally basal crackles,no wheezing or crackles,no use of accessory muscle Cardiovascular system: S1 & S2 +, No JVD,. Gastrointestinal system: Abdomen soft, NT,ND, BS+ Nervous System:Alert, awake, moving extremities and grossly nonfocal Extremities: Mild edema, distal peripheral pulses palpable.  Skin: No rashes,no icterus. MSK: Normal muscle bulk,tone, power   Data Reviewed: I have personally reviewed following labs and imaging  studies CBC: Recent Labs  Lab 12/21/20 0007 12/22/20 0754 12/23/20 0504 12/24/20 0338  WBC 10.3 12.4* 9.1 10.7*  NEUTROABS 8.3*  --   --   --   HGB 8.7* 9.2* 8.6* 9.0*  HCT 25.5* 27.1* 25.0* 25.9*  MCV 91.1 90.0 88.7 89.0  PLT 140* 141* 135* 588   Basic Metabolic Panel: Recent Labs  Lab 12/21/20 0848 12/21/20 1921 12/22/20 0754 12/23/20 0329 12/24/20 0338  NA 121* 123* 126* 125* 128*  K 4.6 4.3 4.1 3.7 3.7  CL 91* 91* 94* 92* 93*  CO2 20* 23 20* 22 22  GLUCOSE 86 110* 96 98 167*  BUN 79* 77* 80* 80* 86*  CREATININE 4.36* 4.43* 4.43* 4.35* 4.54*  CALCIUM 10.0 9.9 9.9 9.5 9.7  PHOS  --  5.9*  --   --   --    GFR: Estimated Creatinine Clearance: 14.3 mL/min (A) (by C-G formula based on SCr of 4.54 mg/dL (H)). Liver Function Tests: Recent Labs  Lab 12/21/20 0007 12/21/20 1921  AST 63*  --   ALT 41  --   ALKPHOS 84  --   BILITOT 1.3*  --   PROT 5.8*  --   ALBUMIN 3.2* 3.0*   Recent Labs  Lab 12/21/20 0007  LIPASE 41   No results for input(s): AMMONIA in the last 168 hours. Coagulation Profile: No results for input(s): INR, PROTIME in the last 168 hours. Cardiac Enzymes: Recent Labs  Lab 12/21/20 0848  CKTOTAL 480*   BNP (last 3 results) No results for input(s): PROBNP in the last 8760 hours. HbA1C: No results for input(s): HGBA1C in the last 72 hours. CBG: No results for input(s): GLUCAP in the last 168 hours. Lipid Profile: No results for input(s): CHOL, HDL, LDLCALC, TRIG, CHOLHDL, LDLDIRECT in the last 72 hours. Thyroid Function Tests: No results for input(s): TSH, T4TOTAL, FREET4, T3FREE, THYROIDAB in the last 72 hours. Anemia Panel: No results for input(s): VITAMINB12, FOLATE, FERRITIN, TIBC, IRON, RETICCTPCT in the last 72 hours. Sepsis Labs: No results for input(s): PROCALCITON, LATICACIDVEN in the last 168 hours.  Recent Results (from the past 240 hour(s))  Resp Panel by RT-PCR (Flu A&B, Covid) Nasopharyngeal Swab     Status: None    Collection Time: 12/21/20  2:45 AM   Specimen: Nasopharyngeal Swab; Nasopharyngeal(NP) swabs in vial transport medium  Result Value Ref Range Status   SARS Coronavirus 2 by RT PCR NEGATIVE NEGATIVE Final    Comment: (NOTE) SARS-CoV-2 target nucleic acids are NOT DETECTED.  The SARS-CoV-2 RNA is generally detectable in upper respiratory specimens during the acute phase of infection. The lowest concentration of SARS-CoV-2 viral copies this assay can detect is 138 copies/mL. A negative result does not preclude SARS-Cov-2 infection and should not be used as the sole basis for treatment or other patient management decisions. A negative result may occur with  improper specimen collection/handling, submission of specimen other than nasopharyngeal swab, presence of viral mutation(s) within the areas targeted by this assay, and inadequate number of viral copies(<138 copies/mL). A negative result must be combined with clinical observations, patient history, and epidemiological information. The expected result is Negative.  Fact Sheet for Patients:  EntrepreneurPulse.com.au  Fact Sheet for Healthcare Providers:  IncredibleEmployment.be  This test is no t yet approved or cleared by the [place] FDA and  has been authorized for detection and/or diagnosis of SARS-CoV-2 by FDA under an Emergency Use Authorization (EUA). This EUA will remain  in effect (meaning this test can be used) for the duration of the COVID-19 declaration under Section 564(b)(1) of the Act, 21 U.S.C.section 360bbb-3(b)(1), unless the authorization is terminated  or revoked sooner.       Influenza A by PCR NEGATIVE NEGATIVE Final   Influenza B by PCR NEGATIVE NEGATIVE Final  Comment: (NOTE) The Xpert Xpress SARS-CoV-2/FLU/RSV plus assay is intended as an aid in the diagnosis of influenza from Nasopharyngeal swab specimens and should not be used as a sole basis for treatment.  Nasal washings and aspirates are unacceptable for Xpert Xpress SARS-CoV-2/FLU/RSV testing.  Fact Sheet for Patients: EntrepreneurPulse.com.au  Fact Sheet for Healthcare Providers: IncredibleEmployment.be  This test is not yet approved or cleared by the [place] FDA and has been authorized for detection and/or diagnosis of SARS-CoV-2 by FDA under an Emergency Use Authorization (EUA). This EUA will remain in effect (meaning this test can be used) for the duration of the COVID-19 declaration under Section 564(b)(1) of the Act, 21 U.S.C. section 360bbb-3(b)(1), unless the authorization is terminated or revoked.  Performed at [facility], [place] [address], [place], [place] [address]      Radiology Studies: DG Abd Portable 1V  Result Date: 12/23/2020 CLINICAL DATA:  Gastric foreign body, may have swallowed hearing aid EXAM: PORTABLE ABDOMEN - 1 VIEW COMPARISON:  12/22/2020; correlation CT chest abdomen 12/21/2020 FINDINGS: Electronic components of a foreign body again identified LEFT paraspinal at L2-L3, unchanged. This projects over gastric antrum and transverse colon. Bowel gas pattern normal. Brachytherapy seed implants at prostate bed. Bones demineralized with degenerative changes lumbar spine. No urinary tract calcification. Atherosclerotic calcifications in the iliac systems. IMPRESSION: Persistent electronic foreign body LEFT paraspinal at L2-3 consistent with ingested hearing aid. This projects over the gastric antrum and the transverse colon and could be within either, though the lack of interval change in position since the previous study suggests gastric retention. Electronically Signed   By: Lavonia Dana M.D.   On: 12/23/2020 08:45   ECHOCARDIOGRAM COMPLETE  Result Date: 12/22/2020    ECHOCARDIOGRAM REPORT   Patient Name:   [name] [name] Date of Exam: 12/22/2020 Medical Rec #:  [id number]     Height:       72.0 in Accession #:    [id number]     Weight:       253.5 lb Date of Birth:  [birth year]    BSA:          2.357 m Patient Age:    66 years      BP:           121/69 mmHg Patient Gender: M             HR:           51 bpm. Exam Location:  Inpatient Procedure: 3D Echo, 2D Echo, Cardiac Doppler and Color Doppler Indications:    I50.40* Unspecified combined systolic (congestive) and diastolic                 (congestive) heart failure  History:        Patient has prior history of Echocardiogram examinations, most                 recent 11/24/2011. CAD and Previous Myocardial Infarction,                 Abnormal ECG, Arrythmias:Atrial Fibrillation,                 Signs/Symptoms:Chest Pain; Risk Factors:Hypertension.  Sonographer:    Roseanna Rainbow RDCS Referring Phys: [id number] RONDELL A SMITH  Sonographer Comments: Technically difficult study due to poor echo windows. IMPRESSIONS  1. Left ventricular ejection fraction, by estimation, is 55 to 60%. The left ventricle has normal function. The left ventricle has no regional wall motion abnormalities. There  is moderate left ventricular hypertrophy. Left ventricular diastolic parameters are indeterminate.  2. Right ventricular systolic function is normal. The right ventricular size is normal. There is normal pulmonary artery systolic pressure.  3. Left atrial size was moderately dilated.  4. Right atrial size was mildly dilated.  5. The pericardial effusion is anterior to the right ventricle.  6. The mitral valve is degenerative. Mild mitral valve regurgitation. No evidence of mitral stenosis. Moderate mitral annular calcification.  7. The aortic valve is tricuspid. There is moderate calcification of the aortic valve. Aortic valve regurgitation is not visualized. Mild to moderate aortic valve sclerosis/calcification is present, without any evidence of aortic stenosis.  8. There is mild dilatation of the ascending aorta, measuring 38 mm.  9. The inferior vena cava is normal in size with greater than 50% respiratory  variability, suggesting right atrial pressure of 3 mmHg. FINDINGS  Left Ventricle: Left ventricular ejection fraction, by estimation, is 55 to 60%. The left ventricle has normal function. The left ventricle has no regional wall motion abnormalities. The left ventricular internal cavity size was normal in size. There is  moderate left ventricular hypertrophy. Left ventricular diastolic parameters are indeterminate. Right Ventricle: The right ventricular size is normal. No increase in right ventricular wall thickness. Right ventricular systolic function is normal. There is normal pulmonary artery systolic pressure. The tricuspid regurgitant velocity is 2.46 m/s, and  with an assumed right atrial pressure of 8 mmHg, the estimated right ventricular systolic pressure is 44.9 mmHg. Left Atrium: Left atrial size was moderately dilated. Right Atrium: Right atrial size was mildly dilated. Pericardium: Trivial pericardial effusion is present. The pericardial effusion is anterior to the right ventricle. Mitral Valve: The mitral valve is degenerative in appearance. There is mild thickening of the mitral valve leaflet(s). There is mild calcification of the mitral valve leaflet(s). Moderate mitral annular calcification. Mild mitral valve regurgitation. No evidence of mitral valve stenosis. Tricuspid Valve: The tricuspid valve is normal in structure. Tricuspid valve regurgitation is trivial. No evidence of tricuspid stenosis. Aortic Valve: The aortic valve is tricuspid. There is moderate calcification of the aortic valve. Aortic valve regurgitation is not visualized. Mild to moderate aortic valve sclerosis/calcification is present, without any evidence of aortic stenosis. Pulmonic Valve: The pulmonic valve was normal in structure. Pulmonic valve regurgitation is not visualized. No evidence of pulmonic stenosis. Aorta: The aortic root is normal in size and structure. There is mild dilatation of the ascending aorta, measuring 38 mm.  Venous: The inferior vena cava is normal in size with greater than 50% respiratory variability, suggesting right atrial pressure of 3 mmHg. IAS/Shunts: No atrial level shunt detected by color flow Doppler.  LEFT VENTRICLE PLAX 2D LVIDd:         4.90 cm      Diastology LVIDs:         3.50 cm      LV e' medial:   7.07 cm/s LV PW:         1.40 cm      LV E/e' medial: 8.8 LV IVS:        1.50 cm LVOT diam:     2.50 cm LV SV:         131 LV SV Index:   56 LVOT Area:     4.91 cm  LV Volumes (MOD) LV vol d, MOD A2C: 65.0 ml LV vol d, MOD A4C: 113.0 ml LV vol s, MOD A2C: 37.2 ml LV vol s, MOD A4C: 63.2 ml  LV SV MOD A2C:     27.8 ml LV SV MOD A4C:     113.0 ml LV SV MOD BP:      39.3 ml RIGHT VENTRICLE             IVC RV S prime:     11.30 cm/s  IVC diam: 3.10 cm TAPSE (M-mode): 2.5 cm LEFT ATRIUM             Index       RIGHT ATRIUM           Index LA diam:        4.30 cm 1.82 cm/m  RA Area:     27.90 cm LA Vol (A2C):   95.6 ml 40.57 ml/m RA Volume:   87.20 ml  37.00 ml/m LA Vol (A4C):   79.0 ml 33.52 ml/m LA Biplane Vol: 90.6 ml 38.45 ml/m  AORTIC VALVE LVOT Vmax:   119.00 cm/s LVOT Vmean:  78.400 cm/s LVOT VTI:    0.267 m  AORTA Ao Root diam: 3.60 cm Ao Asc diam:  3.80 cm MITRAL VALVE               TRICUSPID VALVE MV Area (PHT): 4.68 cm    TR Peak grad:   24.2 mmHg MV Decel Time: 162 msec    TR Vmax:        246.00 cm/s MV E velocity: 62.35 cm/s                            SHUNTS                            Systemic VTI:  0.27 m                            Systemic Diam: 2.50 cm Jenkins Rouge MD Electronically signed by Jenkins Rouge MD Signature Date/Time: 12/22/2020/4:14:48 PM    Final      LOS: 3 days   Antonieta Pert, MD [facility]  12/24/2020, 11:43 AM

## 2020-12-24 NOTE — Progress Notes (Signed)
Subjective: Patient was seen and examined in presence of his daughter at bedside. He had scrambled eggs and potatoes for breakfast today. Denies abdominal pain. Has not had a bowel movement since admission.  Objective: Vital signs in last 24 hours: Temp:  [97.3 F (36.3 C)-97.6 F (36.4 C)] 97.5 F (36.4 C) (04/09 0953) Pulse Rate:  [60-79] 63 (04/09 0953) Resp:  [12-20] 17 (04/09 0953) BP: (75-132)/(48-75) 96/57 (04/09 0953) SpO2:  [92 %-100 %] 96 % (04/09 0953) Weight:  [95.4 kg] 95.4 kg (04/09 0357) Weight change:  Last BM Date: 12/22/20  PE: Elderly, slightly hard of hearing but understands and responds appropriately GENERAL: Mild pallor, no obvious icterus ABDOMEN: Soft, nondistended, nontender, normoactive bowel sounds EXTREMITIES: No deformity, no edema  Lab Results: Results for orders placed or performed during the hospital encounter of 12/20/20 (from the past 48 hour(s))  Basic metabolic panel     Status: Abnormal   Collection Time: 12/23/20  3:29 AM  Result Value Ref Range   Sodium 125 (L) 135 - 145 mmol/L   Potassium 3.7 3.5 - 5.1 mmol/L   Chloride 92 (L) 98 - 111 mmol/L   CO2 22 22 - 32 mmol/L   Glucose, Bld 98 70 - 99 mg/dL    Comment: Glucose reference range applies only to samples taken after fasting for at least 8 hours.   BUN 80 (H) 8 - 23 mg/dL   Creatinine, Ser 4.35 (H) 0.61 - 1.24 mg/dL   Calcium 9.5 8.9 - 10.3 mg/dL   GFR, Estimated 13 (L) >60 mL/min    Comment: (NOTE) Calculated using the CKD-EPI Creatinine Equation (2021)    Anion gap 11 5 - 15    Comment: Performed at [facility] [address] [place], [place] [address]  CBC     Status: Abnormal   Collection Time: 12/23/20  5:04 AM  Result Value Ref Range   WBC 9.1 4.0 - 10.5 K/uL   RBC 2.82 (L) 4.22 - 5.81 MIL/uL   Hemoglobin 8.6 (L) 13.0 - 17.0 g/dL   HCT 25.0 (L) 39.0 - 52.0 %   MCV 88.7 80.0 - 100.0 fL   MCH 30.5 26.0 - 34.0 pg   MCHC 34.4 30.0 - 36.0 g/dL   RDW 14.7 11.5 -  15.5 %   Platelets 135 (L) 150 - 400 K/uL   nRBC 0.0 0.0 - 0.2 %    Comment: Performed at [facility], [place] [address], [place], [place] [address]  Basic metabolic panel     Status: Abnormal   Collection Time: 12/24/20  3:38 AM  Result Value Ref Range   Sodium 128 (L) 135 - 145 mmol/L   Potassium 3.7 3.5 - 5.1 mmol/L   Chloride 93 (L) 98 - 111 mmol/L   CO2 22 22 - 32 mmol/L   Glucose, Bld 167 (H) 70 - 99 mg/dL    Comment: Glucose reference range applies only to samples taken after fasting for at least 8 hours.   BUN 86 (H) 8 - 23 mg/dL   Creatinine, Ser 4.54 (H) 0.61 - 1.24 mg/dL   Calcium 9.7 8.9 - 10.3 mg/dL   GFR, Estimated 12 (L) >60 mL/min    Comment: (NOTE) Calculated using the CKD-EPI Creatinine Equation (2021)    Anion gap 13 5 - 15    Comment: Performed at [facility] [address] [place], [place] [address]  CBC     Status: Abnormal   Collection Time:  12/24/20  3:38 AM  Result Value Ref Range   WBC 10.7 (H) 4.0 - 10.5 K/uL   RBC 2.91 (L) 4.22 - 5.81 MIL/uL   Hemoglobin 9.0 (L) 13.0 - 17.0 g/dL   HCT 25.9 (L) 39.0 - 52.0 %   MCV 89.0 80.0 - 100.0 fL   MCH 30.9 26.0 - 34.0 pg   MCHC 34.7 30.0 - 36.0 g/dL   RDW 15.1 11.5 - 15.5 %   Platelets 157 150 - 400 K/uL   nRBC 0.0 0.0 - 0.2 %    Comment: Performed at [facility], [place] [address], [place], [place] [address]    Studies/Results: DG Abd Portable 1V  Result Date: 12/23/2020 CLINICAL DATA:  Gastric foreign body, may have swallowed hearing aid EXAM: PORTABLE ABDOMEN - 1 VIEW COMPARISON:  12/22/2020; correlation CT chest abdomen 12/21/2020 FINDINGS: Electronic components of a foreign body again identified LEFT paraspinal at L2-L3, unchanged. This projects over gastric antrum and transverse colon. Bowel gas pattern normal. Brachytherapy seed implants at prostate bed. Bones demineralized with degenerative changes lumbar spine. No urinary tract calcification. Atherosclerotic calcifications in the iliac  systems. IMPRESSION: Persistent electronic foreign body LEFT paraspinal at L2-3 consistent with ingested hearing aid. This projects over the gastric antrum and the transverse colon and could be within either, though the lack of interval change in position since the previous study suggests gastric retention. Electronically Signed   By: Lavonia Dana M.D.   On: 12/23/2020 08:45   ECHOCARDIOGRAM COMPLETE  Result Date: 12/22/2020    ECHOCARDIOGRAM REPORT   Patient Name:   [name] [name] Date of Exam: 12/22/2020 Medical Rec #:  [id number]     Height:       72.0 in Accession #:    [id number]    Weight:       253.5 lb Date of Birth:  April 10, 1933    BSA:          2.357 m Patient Age:    87 years      BP:           121/69 mmHg Patient Gender: M             HR:           51 bpm. Exam Location:  Inpatient Procedure: 3D Echo, 2D Echo, Cardiac Doppler and Color Doppler Indications:    I50.40* Unspecified combined systolic (congestive) and diastolic                 (congestive) heart failure  History:        Patient has prior history of Echocardiogram examinations, most                 recent 11/24/2011. CAD and Previous Myocardial Infarction,                 Abnormal ECG, Arrythmias:Atrial Fibrillation,                 Signs/Symptoms:Chest Pain; Risk Factors:Hypertension.  Sonographer:    Roseanna Rainbow RDCS Referring Phys: [id number] RONDELL A SMITH  Sonographer Comments: Technically difficult study due to poor echo windows. IMPRESSIONS  1. Left ventricular ejection fraction, by estimation, is 55 to 60%. The left ventricle has normal function. The left ventricle has no regional wall motion abnormalities. There is moderate left ventricular hypertrophy. Left ventricular diastolic parameters are indeterminate.  2. Right ventricular systolic function is normal. The right ventricular size is normal. There is normal pulmonary artery systolic pressure.  3. Left atrial size was moderately dilated.  4. Right atrial size was mildly dilated.  5. The  pericardial effusion is anterior to the right ventricle.  6. The mitral valve is degenerative. Mild mitral valve regurgitation. No evidence of mitral stenosis. Moderate mitral annular calcification.  7. The aortic valve is tricuspid. There is moderate calcification of the aortic valve. Aortic valve regurgitation is not visualized. Mild to moderate aortic valve sclerosis/calcification is present, without any evidence of aortic stenosis.  8. There is mild dilatation of the ascending aorta, measuring 38 mm.  9. The inferior vena cava is normal in size with greater than 50% respiratory variability, suggesting right atrial pressure of 3 mmHg. FINDINGS  Left Ventricle: Left ventricular ejection fraction, by estimation, is 55 to 60%. The left ventricle has normal function. The left ventricle has no regional wall motion abnormalities. The left ventricular internal cavity size was normal in size. There is  moderate left ventricular hypertrophy. Left ventricular diastolic parameters are indeterminate. Right Ventricle: The right ventricular size is normal. No increase in right ventricular wall thickness. Right ventricular systolic function is normal. There is normal pulmonary artery systolic pressure. The tricuspid regurgitant velocity is 2.46 m/s, and  with an assumed right atrial pressure of 8 mmHg, the estimated right ventricular systolic pressure is 76.7 mmHg. Left Atrium: Left atrial size was moderately dilated. Right Atrium: Right atrial size was mildly dilated. Pericardium: Trivial pericardial effusion is present. The pericardial effusion is anterior to the right ventricle. Mitral Valve: The mitral valve is degenerative in appearance. There is mild thickening of the mitral valve leaflet(s). There is mild calcification of the mitral valve leaflet(s). Moderate mitral annular calcification. Mild mitral valve regurgitation. No evidence of mitral valve stenosis. Tricuspid Valve: The tricuspid valve is normal in structure.  Tricuspid valve regurgitation is trivial. No evidence of tricuspid stenosis. Aortic Valve: The aortic valve is tricuspid. There is moderate calcification of the aortic valve. Aortic valve regurgitation is not visualized. Mild to moderate aortic valve sclerosis/calcification is present, without any evidence of aortic stenosis. Pulmonic Valve: The pulmonic valve was normal in structure. Pulmonic valve regurgitation is not visualized. No evidence of pulmonic stenosis. Aorta: The aortic root is normal in size and structure. There is mild dilatation of the ascending aorta, measuring 38 mm. Venous: The inferior vena cava is normal in size with greater than 50% respiratory variability, suggesting right atrial pressure of 3 mmHg. IAS/Shunts: No atrial level shunt detected by color flow Doppler.  LEFT VENTRICLE PLAX 2D LVIDd:         4.90 cm      Diastology LVIDs:         3.50 cm      LV e' medial:   7.07 cm/s LV PW:         1.40 cm      LV E/e' medial: 8.8 LV IVS:        1.50 cm LVOT diam:     2.50 cm LV SV:         131 LV SV Index:   56 LVOT Area:     4.91 cm  LV Volumes (MOD) LV vol d, MOD A2C: 65.0 ml LV vol d, MOD A4C: 113.0 ml LV vol s, MOD A2C: 37.2 ml LV vol s, MOD A4C: 63.2 ml LV SV MOD A2C:     27.8 ml LV SV MOD A4C:     113.0 ml LV SV MOD BP:      39.3 ml RIGHT VENTRICLE  IVC RV S prime:     11.30 cm/s  IVC diam: 3.10 cm TAPSE (M-mode): 2.5 cm LEFT ATRIUM             Index       RIGHT ATRIUM           Index LA diam:        4.30 cm 1.82 cm/m  RA Area:     27.90 cm LA Vol (A2C):   95.6 ml 40.57 ml/m RA Volume:   87.20 ml  37.00 ml/m LA Vol (A4C):   79.0 ml 33.52 ml/m LA Biplane Vol: 90.6 ml 38.45 ml/m  AORTIC VALVE LVOT Vmax:   119.00 cm/s LVOT Vmean:  78.400 cm/s LVOT VTI:    0.267 m  AORTA Ao Root diam: 3.60 cm Ao Asc diam:  3.80 cm MITRAL VALVE               TRICUSPID VALVE MV Area (PHT): 4.68 cm    TR Peak grad:   24.2 mmHg MV Decel Time: 162 msec    TR Vmax:        246.00 cm/s MV E velocity:  62.35 cm/s                            SHUNTS                            Systemic VTI:  0.27 m                            Systemic Diam: 2.50 cm Jenkins Rouge MD Electronically signed by Jenkins Rouge MD Signature Date/Time: 12/22/2020/4:14:48 PM    Final     Medications: I have reviewed the patient's current medications.  Assessment: Hearing aid/swallowed foreign body appears to be in colon(reviewed abdominal x-ray, official reading pending). 2.1 cm metallic foreign body, originally noted in gastric body on CAT scan on admission from 12/21/2020  Moderate right-sided pleural effusion, small left pleural effusion  Bilateral rib fractures  Nonbleeding gastric ulcer, on PPI twice daily, as per Dr. Paulita Fujita, recommend holding Eliquis for 5 days if possible.  Acute on chronic kidney disease  History of atrial fibrillation  Plan: Supportive management Will order MiraLAX 17 g twice a day We will order Metamucil 1 packet 3 times a day. Abdominal x-ray in a.m.Marland Kitchen  Ronnette Juniper, MD 12/24/2020, 11:34 AM

## 2020-12-24 NOTE — NC FL2 (Signed)
[place] LEVEL OF CARE SCREENING TOOL     IDENTIFICATION  Patient Name: [name] [name] Birthdate: 1933-04-08 Sex: male Admission Date (Current Location): 12/20/2020  [facility] and [place] Number:  Herbalist and Address:  [facility], [place] [address], [place], [place] [address]      Provider Number: [id number]  Attending Physician Name and Address:  Antonieta Pert, MD  Relative Name and Phone Number:       Current Level of Care: Hospital Recommended Level of Care: [place] Prior Approval Number:    Date Approved/Denied:   PASRR Number: [id number]  Discharge Plan: SNF    Current Diagnoses: Patient Active Problem List   Diagnosis Date Noted  . Hyponatremia 12/21/2020  . Multiple fractures of ribs, bilateral, initial encounter for closed fracture 12/21/2020  . Falls 12/21/2020  . Acute kidney injury superimposed on chronic kidney disease ([place]) 12/21/2020  . Foreign body in stomach 12/21/2020  . Iron deficiency anemia 12/21/2020  . Obesity (BMI 30-39.9) 12/21/2020  . Hypercalcemia 12/21/2020  . CAD (coronary artery disease), stent placed 11/26/11  11/27/2011  . S/P angioplasty with stent, 11/26/11 distal AVGroove LCX into OM3 with BMS (Integrity) and rescue PTCA on OM2 jailed by stent. 11/27/2011  . NSTEMI (non-ST elevated myocardial infarction) most likely as outpatient 11/24/2011  . CKD (chronic kidney disease) stage 3, GFR 30-59 ml/min (HCC) 11/24/2011  . Chest pain 11/23/2011  . HTN (hypertension) 11/23/2011  . A-fib, New onset 11/23/2011  . Acid reflux     Orientation RESPIRATION BLADDER Height & Weight     Self,Place  Normal Incontinent Weight: 210 lb 5.1 oz (95.4 kg) Height:  6' (182.9 cm)  BEHAVIORAL SYMPTOMS/MOOD NEUROLOGICAL BOWEL NUTRITION STATUS      Incontinent Diet  AMBULATORY STATUS COMMUNICATION OF NEEDS Skin   Limited Assist Verbally Normal                       Personal Care  Assistance Level of Assistance  Bathing,Feeding,Dressing Bathing Assistance: Maximum assistance Feeding assistance: Independent Dressing Assistance: Maximum assistance     Functional Limitations Info  Sight,Hearing,Speech Sight Info: Adequate Hearing Info: Impaired (hearing aid) Speech Info: Adequate    SPECIAL CARE FACTORS FREQUENCY  PT (By licensed PT),OT (By licensed OT)     PT Frequency: 5x a week OT Frequency: 5x a week            Contractures Contractures Info: Not present    Additional Factors Info  Code Status,Allergies Code Status Info: Full Allergies Info: Hydrochlorothiazide   Morphine And Related           Current Medications (12/24/2020):  This is the current hospital active medication list Current Facility-Administered Medications  Medication Dose Route Frequency Provider Last Rate Last Admin  . 0.9 %  sodium chloride infusion  250 mL Intravenous PRN Arta Silence, MD      . acetaminophen (TYLENOL) tablet 650 mg  650 mg Oral Q4H PRN Arta Silence, MD   650 mg at 12/23/20 2233  . allopurinol (ZYLOPRIM) tablet 100 mg  100 mg Oral Daily Arta Silence, MD   100 mg at 12/24/20 0950  . aspirin EC tablet 81 mg  81 mg Oral Daily Arta Silence, MD   81 mg at 12/24/20 0950  . carvedilol (COREG) tablet 12.5 mg  12.5 mg Oral BID WC Kc, Ramesh, MD      . furosemide (LASIX) injection 80 mg  80 mg  Intravenous BID Arta Silence, MD   80 mg at 12/23/20 1721  . latanoprost (XALATAN) 0.005 % ophthalmic solution 1 drop  1 drop Both Eyes QHS Arta Silence, MD   1 drop at 12/23/20 2147  . lidocaine (LIDODERM) 5 % 1 patch  1 patch Transdermal Q24H Arta Silence, MD   1 patch at 12/23/20 1102  . lidocaine (LIDODERM) 5 % 1 patch  1 patch Transdermal Q24H Arta Silence, MD   1 patch at 12/23/20 1101  . melatonin tablet 5 mg  5 mg Oral QHS PRN Arta Silence, MD   5 mg at 12/23/20 2147  . ondansetron (ZOFRAN) injection 4 mg  4 mg Intravenous Q6H PRN Arta Silence,  MD      . pantoprazole (PROTONIX) EC tablet 40 mg  40 mg Oral BID Lenis Dickinson, MD   40 mg at 12/24/20 0951  . rosuvastatin (CRESTOR) tablet 5 mg  5 mg Oral Q M,W,F Arta Silence, MD   5 mg at 12/23/20 1356  . sodium chloride flush (NS) 0.9 % injection 3 mL  3 mL Intravenous Q12H Arta Silence, MD   3 mL at 12/24/20 0951  . sodium chloride flush (NS) 0.9 % injection 3 mL  3 mL Intravenous PRN Arta Silence, MD      . traMADol Veatrice Bourbon) tablet 25 mg  25 mg Oral Q12H PRN Arta Silence, MD   25 mg at 12/23/20 1354     Discharge Medications: Please see discharge summary for a list of discharge medications.  Relevant Imaging Results:  Relevant Lab Results:   Additional Information SSN: [id number]  Covid vaccinated with booster  Emeterio Reeve, LCSWA

## 2020-12-25 ENCOUNTER — Inpatient Hospital Stay (HOSPITAL_COMMUNITY): Payer: Medicare Other

## 2020-12-25 DIAGNOSIS — S2243XA Multiple fractures of ribs, bilateral, initial encounter for closed fracture: Secondary | ICD-10-CM | POA: Diagnosis not present

## 2020-12-25 LAB — BASIC METABOLIC PANEL
Anion gap: 10 (ref 5–15)
BUN: 94 mg/dL — ABNORMAL HIGH (ref 8–23)
CO2: 25 mmol/L (ref 22–32)
Calcium: 9.7 mg/dL (ref 8.9–10.3)
Chloride: 95 mmol/L — ABNORMAL LOW (ref 98–111)
Creatinine, Ser: 4.65 mg/dL — ABNORMAL HIGH (ref 0.61–1.24)
GFR, Estimated: 12 mL/min — ABNORMAL LOW (ref >60)
Glucose, Bld: 149 mg/dL — ABNORMAL HIGH (ref 70–99)
Potassium: 3.6 mmol/L (ref 3.5–5.1)
Sodium: 130 mmol/L — ABNORMAL LOW (ref 135–145)

## 2020-12-25 LAB — URINALYSIS, ROUTINE W REFLEX MICROSCOPIC
Bilirubin Urine: NEGATIVE
Glucose, UA: NEGATIVE mg/dL
Hgb urine dipstick: NEGATIVE
Ketones, ur: NEGATIVE mg/dL
Leukocytes,Ua: NEGATIVE
Nitrite: NEGATIVE
Protein, ur: NEGATIVE mg/dL
Specific Gravity, Urine: 1.009 (ref 1.005–1.030)
pH: 5 (ref 5.0–8.0)

## 2020-12-25 LAB — CBC
HCT: 25.3 % — ABNORMAL LOW (ref 39.0–52.0)
Hemoglobin: 8.7 g/dL — ABNORMAL LOW (ref 13.0–17.0)
MCH: 31.4 pg (ref 26.0–34.0)
MCHC: 34.4 g/dL (ref 30.0–36.0)
MCV: 91.3 fL (ref 80.0–100.0)
Platelets: 160 10*3/uL (ref 150–400)
RBC: 2.77 MIL/uL — ABNORMAL LOW (ref 4.22–5.81)
RDW: 15.4 % (ref 11.5–15.5)
WBC: 16.6 10*3/uL — ABNORMAL HIGH (ref 4.0–10.5)
nRBC: 0 % (ref 0.0–0.2)

## 2020-12-25 IMAGING — DX DG ABDOMEN 1V
3 series · 3 of 3 positions shown · non-contrast
Comparison: Abdominal x-rays [DATE] and earlier. CT abdomen and
pelvis [DATE]

CLINICAL DATA: Serial daily abdominal x-rays in this 87-year-old
patient who swallowed his hearing aids 5 days ago, as the hearing
aids were not identified on EGD 3 days ago.

EXAM:
PORTABLE [0H] VIEW

[abdomen kub (1 of 3)]
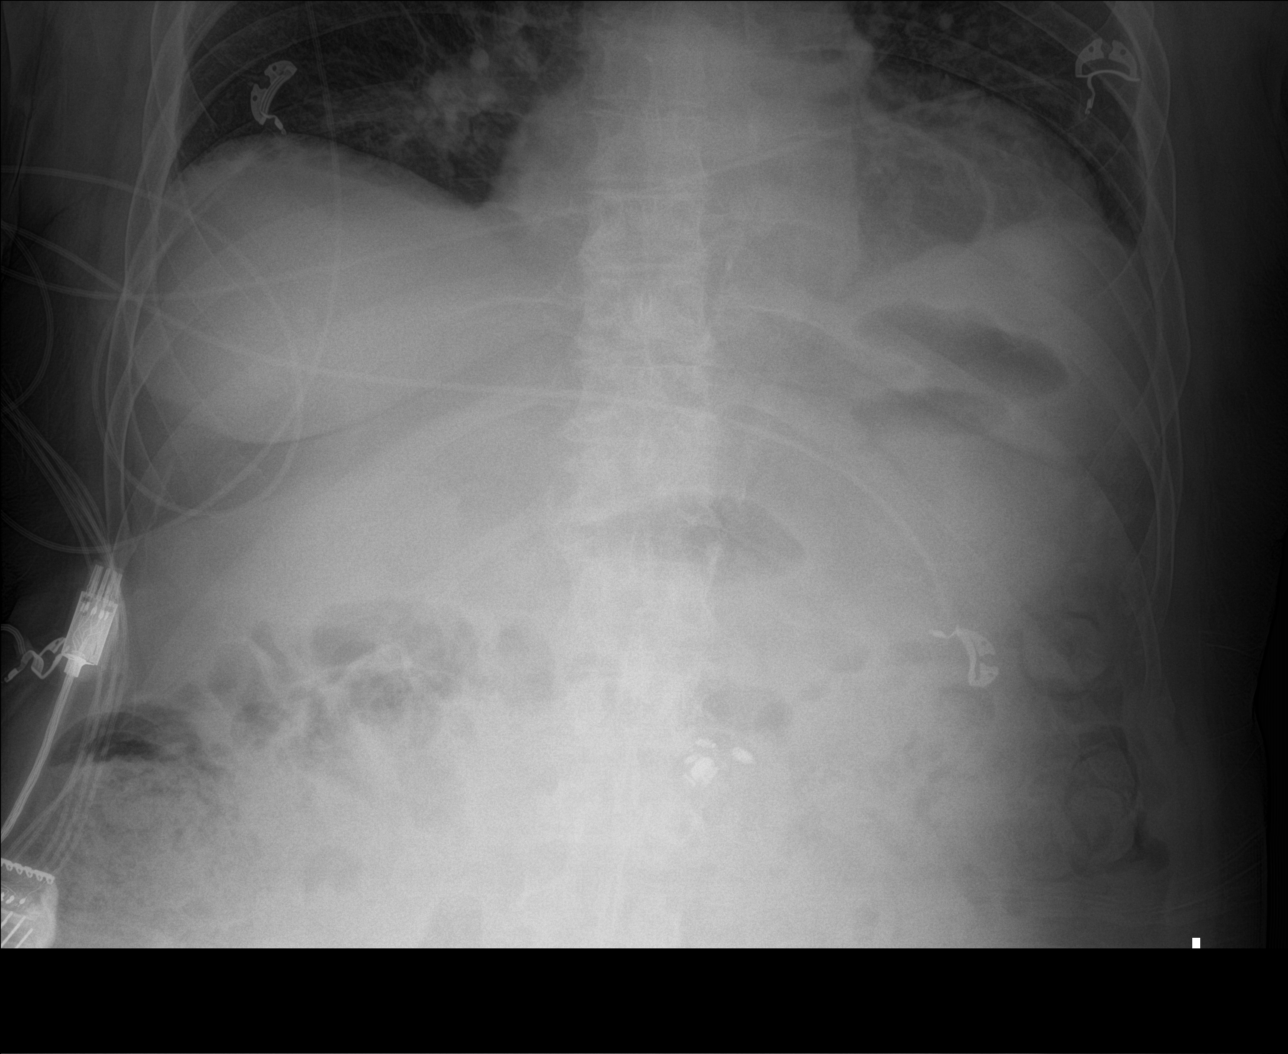

[abdomen kub (2 of 3)]
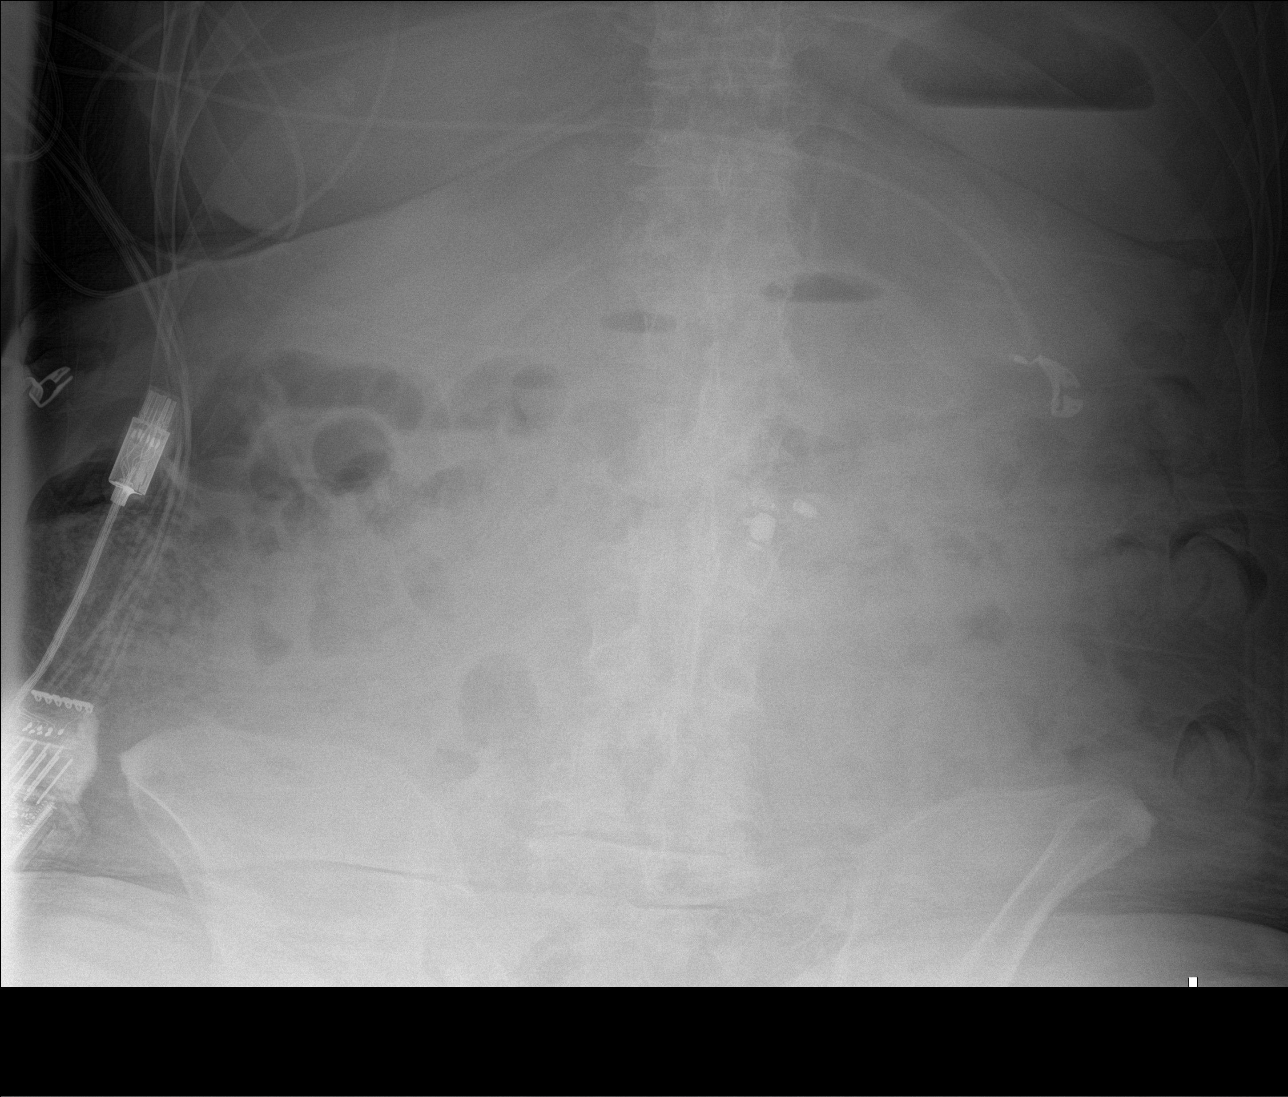

[abdomen kub (3 of 3)]
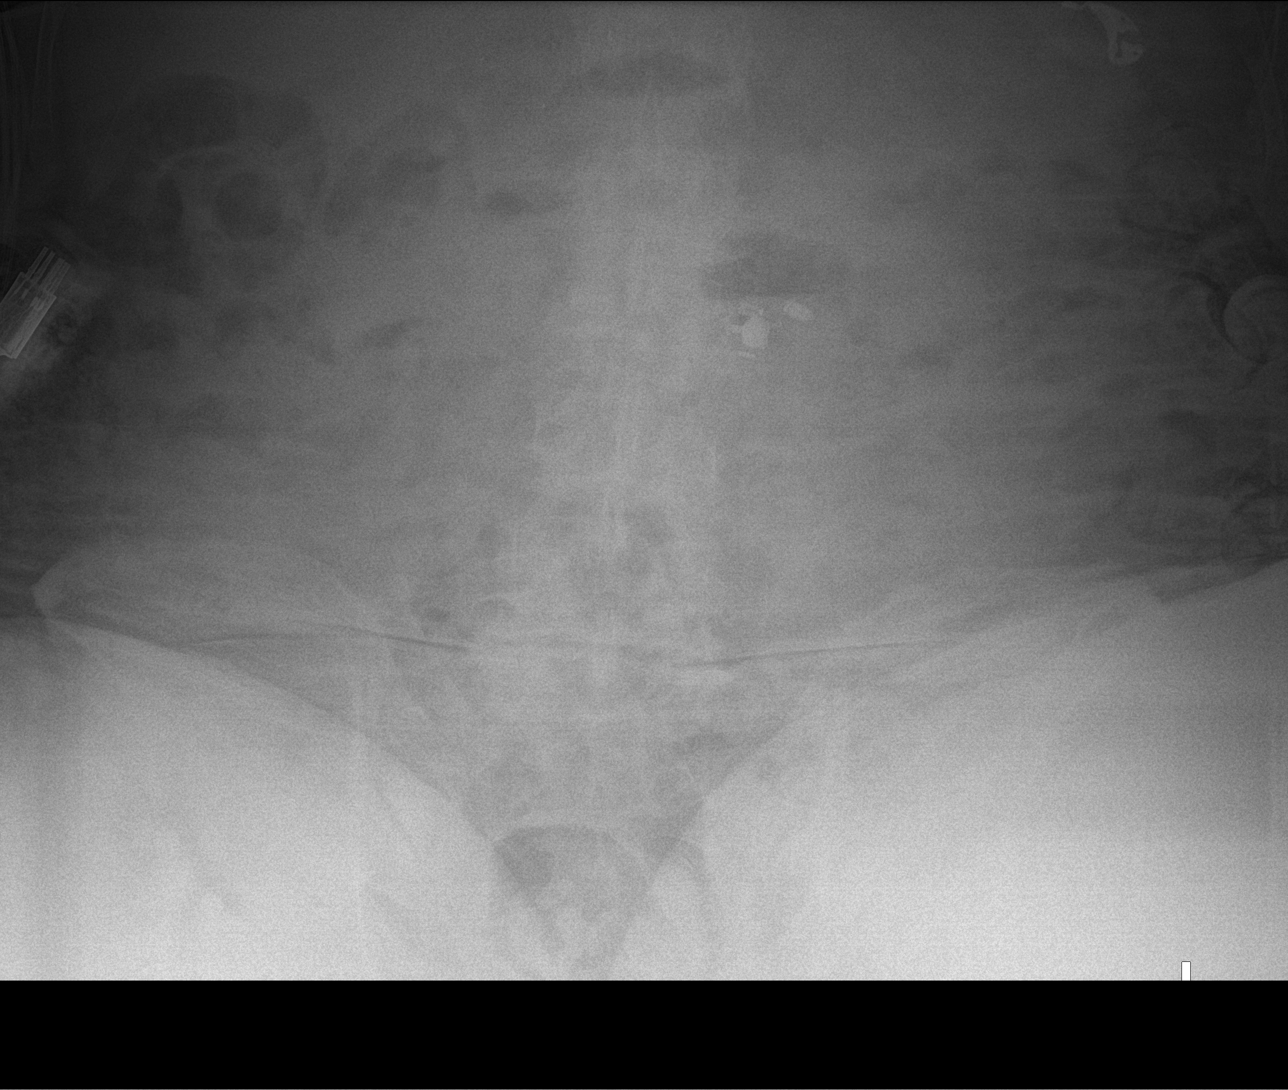

[3 of 3 positions shown; findings below may reference images not displayed]

FINDINGS: The foreign body (or foreign bodies) corresponding to the ingested
hearing aid(s) projects over the central abdomen just to the LEFT of
midline, likely in a proximal jejunal loop. Its position is
unchanged since the abdominal x-ray 3 days ago.

Bowel gas pattern unremarkable without evidence of obstruction or
significant ileus. Moderate to large colonic stool burden as before.
IMPRESSION: Ingested hearing aid(s) projects over the central abdomen just to
the LEFT of midline, likely in a proximal jejunal loop. Its position
is unchanged over the past 3 days.

## 2020-12-25 MED ORDER — FUROSEMIDE 80 MG PO TABS
80.0000 mg | ORAL_TABLET | Freq: Two times a day (BID) | ORAL | Status: DC
  Administered 2020-12-25 (×2): 80 mg via ORAL
  Filled 2020-12-25 (×4): qty 1

## 2020-12-25 MED ORDER — MAGNESIUM CITRATE PO SOLN
1.0000 | Freq: Once | ORAL | Status: AC
  Administered 2020-12-25: 1 via ORAL
  Filled 2020-12-25: qty 296

## 2020-12-25 MED ORDER — CARVEDILOL 6.25 MG PO TABS
6.2500 mg | ORAL_TABLET | Freq: Two times a day (BID) | ORAL | Status: DC
  Administered 2020-12-25 – 2020-12-28 (×3): 6.25 mg via ORAL
  Filled 2020-12-25 (×5): qty 1

## 2020-12-25 NOTE — Plan of Care (Signed)
  Problem: Safety: Goal: Ability to remain free from injury will improve Outcome: Progressing   

## 2020-12-25 NOTE — Progress Notes (Signed)
Pt up to chair to eat lunch with 2 assist and walker. Daughter at the  Bedside.    Paulla Fore, RN, BSN

## 2020-12-25 NOTE — Progress Notes (Addendum)
PROGRESS NOTE    [name] [name]  HAL:[id number] DOB: 06/30/1933 DOA: 12/20/2020 PCP: Antony Contras, MD   Chief Complaint  Patient presents with   Fall    Taking Eliquis  Brief Narrative:  89 old male with history of hypertension, A. fib on Eliquis, CAD history of PCI in 7353, chronic systolic CHF EF 29-9-[id number], GERD, chronic kidney disease stage III brought to the ED after fall at home.  He has had recent falls and while at the shower on 4/5 he fell.  Patient reported that he lost balance. Per report denies any loss of consciousness or trauma to his head. He has been ambulating with use of a walker.  Apparently patient has had severe back pain and sciatica for which he has been seen by physical therapy and has a scheduled appointment with orthopedics.  Due to the pain in his back he has not been sleeping well at night  In the ED,he was afebrile, saturating well on room air, CT scan of the head and cervical spine showed no acute abnormality, but did note a partially visualized right-sided pleural effusion.  Labs significant for for hemoglobin 8.7, platelets 140, sodium 122, chloride 92, CO2 20, BUN 80, creatinine 4.45, calcium 10.8, AST 63, serum osmolarity 287, and TSH 2.577.  Urinalysis was positive for 30 mg/dL protein, but showed no significant signs of infection.  CT scan of the chest for bilateral nondisplaced rib fractures, a metallic foreign body in the gastric body measuring 2.1 cm possibly hearing aid, possible duodenitis/peptic ulcer disease, cardiomegaly with signs of fluid overload with pulmonary edema, and a 4.2 cm ascending aortic aneurysm.  patient was given normal saline IVFs at 75 ml/hr, Patient was admitted and nephrology, gastroenterology and cardiology were consulted. Patient became lethargic after getting morphine in the ED. Patient was followed by GI nephrology cardiology. Underwent EGD that showed stomach ulcer no foreign body  xray 4/9-unchanged portion of the foreign  body appears to be in transverse colon  Subjective: Seen and examined this morning. He has not had a bowel movement yet. Denies cough chest pain respiratory issues  or burning urination or frequency issues. Afebrile overnight, doing well on room Noted uptrending leukocytosis at 16.6k  Assessment & Plan:  Multiple falls at home Chronic low back-scheduled to see [facility] 4/18 as outpatient. He has had at least 4  Falls at home  likely multifactorial in setting of hyponatremia and fluid overload chronic low back pain and physical deconditioning.  Continue supportive measures, diuresis fluid management.  Is overall nonfocal on exam moving lower extremities well has pain issues all over but reluctant to take any narcotics, being managed with Tylenol.  I have added half dose of tramadol incase of severe pain.  Continue PT OT.  Continue lidocaine patch. Can consider MRI of the spine if able to retrieve the foreign body from the stoma w/ EGD or he will need to follow-up with scheduled outpatient follow-up.  Multiple fractures of ribs,bilateral,due to fall: Continue current pain control steroids as able avoid incentive spirometry.    Acute encephalopathy following IV morphine in the ED. Minimize narcotic use.  Hyponatremia: Suspect hypervolemic hyponatremia -resolving nicely with diuresis and 1.2 L fluid restriction.  Appreciate nephrology input.  Lasix being changed to p.o. Recent Labs  Lab 12/21/20 1921 12/22/20 0754 12/23/20 0329 12/24/20 0338 12/25/20 0253  NA 123* 126* 125* 128* 130*   AKI on CKD 3b-PCP records shows baseline creatinine 4/21 was 3.8. No value since then, possibly could have  progressed since.his CT shows bilateral complex renal cyst. Nephrology on board and appreciate input.  Menses stable slightly uptrending BUN, creatinine in ~4.6. Cont diuresis per nephrology. He has had good urine output.  Recent Labs  Lab 12/21/20 1921 12/22/20 0754 12/23/20 0329  12/24/20 0338 12/25/20 0253  BUN 77* 80* 80* 86* 94*  CREATININE 4.43* 4.43* 4.35* 4.54* 4.65*  Net IO Since Admission: -8,505 mL [12/25/20 0838]  Intake/Output Summary (Last 24 hours) at 12/25/2020 0838 Last data filed at 12/25/2020 0600 Gross per 24 hour  Intake 1040 ml  Output 3075 ml  Net -2035 ml   Foreign body-hearing aid in  GI tract: Unable to be retrieved with EGD.  X-ray showed for eign body likely in transverse colon.  Appreciate GI input-started MiraLAX 17 twice daily, Metamucil 1 packet 3 times daily , following closely with serial daily abdominal x-ray.  Duodenitis/PUD/nonbleeding gastric ulcer in  EGD 4/7.GI advised to hold anticoagulation/Eliquis for at least 5 days.  Continue PPI twice daily.   Iron deficiency anemia:guaiac was noted to be negative.  Hemoglobin remains overall stable.  Monitor.  Recent Labs  Lab 12/21/20 0007 12/22/20 0754 12/23/20 0504 12/24/20 0338 12/25/20 0253  HGB 8.7* 9.2* 8.6* 9.0* 8.7*  HCT 25.5* 27.1* 25.0* 25.9* 25.3*   Leukocytosis, but afebrile.  Monitor closely for any signs of infection.  Repeat x-ray and UA.  Follow abdominal exam closely given foreign body ingestion. Recent Labs  Lab 12/21/20 0007 12/22/20 0754 12/23/20 0504 12/24/20 0338 12/25/20 0253  WBC 10.3 12.4* 9.1 10.7* 16.6*   HTN: Blood pressure soft hypotensive I have stopped amlodipine and cut down Coreg to half and added holding parameters.  Monitor closely, continue dialysis per nephrology.    CAD S/P angioplasty 2013:trop neg 14>13.continue his aspirin, beta-blocker and statin.  Acute on chronic CHF exacerbation w/ preserved EF:BNP elevated hypervolemic.echo shows normal LV function suspect CHF with preserved EF.  Good negative balance nephrology managing diuretics and now changed to po lasix 80 mg bid (4/10).  Appreciate input.  Monitor intake output and daily weight as below.Net IO Since Admission: -8,505 mL [12/25/20 0838]  Filed Weights   12/20/20 2325  12/24/20 0357  Weight: 115 kg 95.4 kg   Permanent A. fib on Eliquis now with multiple falls rate controlled on Coreg. DOAC is held for EGD and  Now 2/2 stmomach ulcer- GI advised to hold for 5 days.  Resume as soon as okay with GI. Patient is here for temporarily.Is followed by cardiologist in [place].  Thrombocytopenia: resolved  Recent Labs  Lab 12/21/20 0007 12/22/20 0754 12/23/20 0504 12/24/20 0338 12/25/20 0253  PLT 140* 141* 135* 157 160   Hypercalcemia: Calcium level is stable 9.9.  Hyperlipidemia continue statin.   MorBid Obesity BMI 34: Patient will benefit with weight loss PCP follow-up.  Diet Order             DIET SOFT Room service appropriate? Yes; Fluid consistency: Thin; Fluid restriction: 1200 mL Fluid  Diet effective now                 Patient's Body mass index is 28.52 kg/m.  DVT prophylaxis: SCD.  Anticoagulation on hold in the setting of possible GI procedure Code Status:   Code Status: Full Code  Family Communication: plan of care discussed with patient and his son at bedside previously. Discussed with patient's daughter at the bedside 4/9  and with another daughter 4/10.  Status is: Inpatient Remains inpatient appropriate because:IV treatments appropriate  due to intensity of illness or inability to take PO and Inpatient level of care appropriate due to severity of illness  Dispo: The patient is from: Home              Anticipated d/c is to: Skilled nursing facility              Patient currently is not medically stable to d/c.   Difficult to place patient No Unresulted Labs (From admission, onward)            Start     Ordered   12/22/20 0865  Basic metabolic panel  Daily,   R      12/22/20 0645          Medications reviewed:  Scheduled Meds:  allopurinol  100 mg Oral Daily   aspirin EC  81 mg Oral Daily   carvedilol  12.5 mg Oral BID WC   furosemide  80 mg Oral BID   latanoprost  1 drop Both Eyes QHS   lidocaine  1 patch  Transdermal Q24H   lidocaine  1 patch Transdermal Q24H   pantoprazole  40 mg Oral BID AC   polyethylene glycol  17 g Oral BID   psyllium  1 packet Oral TID   rosuvastatin  5 mg Oral Q M,W,F   sodium chloride flush  3 mL Intravenous Q12H   Continuous Infusions:  sodium chloride      Consultants: Nephrology, cardiology GI   Procedures:see note EGD Medium-sized hiatal hernia. - Non-bleeding gastric ulcer with no stigmata of bleeding. - Gastritis. - Duodenitis. - No foreign body seen to the extent of our examination (D2). Impression: None Moderate Sedation: - Return patient to hospital ward for ongoing care. - Soft diet today. - Continue present medications. - Based on finding of ulcer, would hold Apixaban for another 5 days, if at all possible. - Pantoprazole 40 mg po bid x 6 weeks, then 40 mg po qd thereafter indefinitely. - Perform an H. pylori serology tomorrow. - Miralax 17 grams po bid, to facilitate passage of foreign body. - Serial (daily) abdominal xrays. - [facility] will follow  Antimicrobials: Anti-infectives (From admission, onward)    None      Culture/Microbiology No results found for: SDES, SPECREQUEST, CULT, REPTSTATUS  Other culture-see note  Objective: Vitals: Today's Vitals   12/24/20 2136 12/24/20 2217 12/24/20 2302 12/25/20 0447  BP: 123/73   130/87  Pulse: 71   (!) 104  Resp: 19   18  Temp: 97.7 F (36.5 C)   97.6 F (36.4 C)  TempSrc: Oral   Oral  SpO2: 97%   95%  Weight:      Height:      PainSc:  4  Asleep     Intake/Output Summary (Last 24 hours) at 12/25/2020 0838 Last data filed at 12/25/2020 0600 Gross per 24 hour  Intake 1040 ml  Output 3075 ml  Net -2035 ml   Filed Weights   12/20/20 2325 12/24/20 0357  Weight: 115 kg 95.4 kg   Weight change:   Intake/Output from previous day: 04/09 0701 - 04/10 0700 In: 1040 [P.O.:980] Out: 3075 [Urine:3075] Intake/Output this shift: No intake/output data recorded. Filed Weights    12/20/20 2325 12/24/20 0357  Weight: 115 kg 95.4 kg    Examination: General exam: AAOx3, on RA, hard of hearing, NAD, weak appearing. HEENT:Oral mucosa moist, Ear/Nose WNL grossly, dentition normal. Respiratory system: bilaterally clear,no wheezing or crackles,no use of accessory muscle Cardiovascular  system: S1 & S2 +, No JVD,. Gastrointestinal system: Abdomen soft, NT,ND, BS+ Nervous System:Alert, awake, moving extremities and grossly nonfocal Extremities: leg edema +, distal peripheral pulses palpable.  Skin: No rashes,no icterus. MSK: Normal muscle bulk,tone, power   Data Reviewed: I have personally reviewed following labs and imaging studies CBC: Recent Labs  Lab 12/21/20 0007 12/22/20 0754 12/23/20 0504 12/24/20 0338 12/25/20 0253  WBC 10.3 12.4* 9.1 10.7* 16.6*  NEUTROABS 8.3*  --   --   --   --   HGB 8.7* 9.2* 8.6* 9.0* 8.7*  HCT 25.5* 27.1* 25.0* 25.9* 25.3*  MCV 91.1 90.0 88.7 89.0 91.3  PLT 140* 141* 135* 157 161   Basic Metabolic Panel: Recent Labs  Lab 12/21/20 1921 12/22/20 0754 12/23/20 0329 12/24/20 0338 12/25/20 0253  NA 123* 126* 125* 128* 130*  K 4.3 4.1 3.7 3.7 3.6  CL 91* 94* 92* 93* 95*  CO2 23 20* 22 22 25   GLUCOSE 110* 96 98 167* 149*  BUN 77* 80* 80* 86* 94*  CREATININE 4.43* 4.43* 4.35* 4.54* 4.65*  CALCIUM 9.9 9.9 9.5 9.7 9.7  PHOS 5.9*  --   --   --   --    GFR: Estimated Creatinine Clearance: 13.9 mL/min (A) (by C-G formula based on SCr of 4.65 mg/dL (H)). Liver Function Tests: Recent Labs  Lab 12/21/20 0007 12/21/20 1921  AST 63*  --   ALT 41  --   ALKPHOS 84  --   BILITOT 1.3*  --   PROT 5.8*  --   ALBUMIN 3.2* 3.0*   Recent Labs  Lab 12/21/20 0007  LIPASE 41   No results for input(s): AMMONIA in the last 168 hours. Coagulation Profile: No results for input(s): INR, PROTIME in the last 168 hours. Cardiac Enzymes: Recent Labs  Lab 12/21/20 0848  CKTOTAL 480*   BNP (last 3 results) No results for input(s):  PROBNP in the last 8760 hours. HbA1C: No results for input(s): HGBA1C in the last 72 hours. CBG: No results for input(s): GLUCAP in the last 168 hours. Lipid Profile: No results for input(s): CHOL, HDL, LDLCALC, TRIG, CHOLHDL, LDLDIRECT in the last 72 hours. Thyroid Function Tests: No results for input(s): TSH, T4TOTAL, FREET4, T3FREE, THYROIDAB in the last 72 hours. Anemia Panel: No results for input(s): VITAMINB12, FOLATE, FERRITIN, TIBC, IRON, RETICCTPCT in the last 72 hours. Sepsis Labs: No results for input(s): PROCALCITON, LATICACIDVEN in the last 168 hours.  Recent Results (from the past 240 hour(s))  Resp Panel by RT-PCR (Flu A&B, Covid) Nasopharyngeal Swab     Status: None   Collection Time: 12/21/20  2:45 AM   Specimen: Nasopharyngeal Swab; Nasopharyngeal(NP) swabs in vial transport medium  Result Value Ref Range Status   SARS Coronavirus 2 by RT PCR NEGATIVE NEGATIVE Final    Comment: (NOTE) SARS-CoV-2 target nucleic acids are NOT DETECTED.  The SARS-CoV-2 RNA is generally detectable in upper respiratory specimens during the acute phase of infection. The lowest concentration of SARS-CoV-2 viral copies this assay can detect is 138 copies/mL. A negative result does not preclude SARS-Cov-2 infection and should not be used as the sole basis for treatment or other patient management decisions. A negative result may occur with  improper specimen collection/handling, submission of specimen other than nasopharyngeal swab, presence of viral mutation(s) within the areas targeted by this assay, and inadequate number of viral copies(<138 copies/mL). A negative result must be combined with clinical observations, patient history, and epidemiological information. The expected result  is Negative.  Fact Sheet for Patients:  EntrepreneurPulse.com.au  Fact Sheet for Healthcare Providers:  IncredibleEmployment.be  This test is no t yet approved or  cleared by the [place] FDA and  has been authorized for detection and/or diagnosis of SARS-CoV-2 by FDA under an Emergency Use Authorization (EUA). This EUA will remain  in effect (meaning this test can be used) for the duration of the COVID-19 declaration under Section 564(b)(1) of the Act, 21 U.S.C.section 360bbb-3(b)(1), unless the authorization is terminated  or revoked sooner.       Influenza A by PCR NEGATIVE NEGATIVE Final   Influenza B by PCR NEGATIVE NEGATIVE Final    Comment: (NOTE) The Xpert Xpress SARS-CoV-2/FLU/RSV plus assay is intended as an aid in the diagnosis of influenza from Nasopharyngeal swab specimens and should not be used as a sole basis for treatment. Nasal washings and aspirates are unacceptable for Xpert Xpress SARS-CoV-2/FLU/RSV testing.  Fact Sheet for Patients: EntrepreneurPulse.com.au  Fact Sheet for Healthcare Providers: IncredibleEmployment.be  This test is not yet approved or cleared by the [place] FDA and has been authorized for detection and/or diagnosis of SARS-CoV-2 by FDA under an Emergency Use Authorization (EUA). This EUA will remain in effect (meaning this test can be used) for the duration of the COVID-19 declaration under Section 564(b)(1) of the Act, 21 U.S.C. section 360bbb-3(b)(1), unless the authorization is terminated or revoked.  Performed at [facility], [place] [address] [place], [place] [address]      Radiology Studies: DG Abd 2 Views  Result Date: 12/24/2020 CLINICAL DATA:  Apparent foreign body in elementary tract EXAM: ABDOMEN - 2 VIEW COMPARISON:  December 23, 2020 and December 22, 2020 FINDINGS: Supine and upright images obtained. There is again noted a metallic foreign body, likely a hearing assist device slightly to the left of midline, unchanged in position, likely in the transverse colon. There is air throughout bowel. There is no appreciable bowel dilatation or air-fluid  level to suggest bowel obstruction. No evident free air. Seed implants are noted in the prostate region. IMPRESSION: Apparent hearing assist device in mid abdominal regions slightly to the left of midline, likely in transverse colon, unchanged in position since 2 days prior. No bowel obstruction or free air is evident. Seed implants in prostate. Comment: This radiopaque foreign body appears on current examination to be located inferior to the stomach. Electronically Signed   By: Lowella Grip III M.D.   On: 12/24/2020 12:15     LOS: 4 days   Antonieta Pert, MD [facility]  12/25/2020, 8:38 AM

## 2020-12-25 NOTE — Progress Notes (Signed)
[facility] Progress Note    Assessment/ Plan:   1.  Presumed AKI on CKD 3: Baseline Cr from 12/2019 was 3.8 from PCP records.  No values since then so possibly could have progressed since then.  CT scan shows bilateral complex renal cysts but no obstruction.  No hypotensive episodes unless falls were vagal/ syncopal events.  UA unremarkable at present.  Priority will be getting volume off and improving Na.  Would hold cozaar. No indication for dialysis at present.  Cr is stable at 4.4-4.6 ? Current baseline  2.  Hyponatremia: appears to be hypervolemic hyponatremia in the setting of too much fluid.  Improving, switching from IV to PO Lasix 80 PO BID  1200 mL fluid restriction  3.  Acute on chronic CHF exacerbation: cardiology on board- appreciate assistance.  Norvasc stopped and Coreg decreased in setting of lower BP, I decreased Coreg again this AM to 6.25 mg BID   4.  Acute encephalopathy/ falls- may be precipitated by the hyponatremia.  Mental status is better today  5. Rib fractures/ facial trauma- supportive care, please avoid morphine in CKD pt  6.  Retained hearing Aid in stomach- GI following, S/P egd 12/23/20, did not find, abd xray with hearing aid in tranverse colon  7.  Afib: Eliquis on hold for now  8.  Dispo: will sign off.  Will arrange followup with me in clinic in 2-4 weeks if he remains in [place].  He does not have a nephrologist.  He will need to establish with a nephrologist in [place].  Subjective:    Na 130, Cr 4.6 today.  Dtr at bedside.   Objective:   BP 114/72 (BP Location: Left Arm)   Pulse (!) 104   Temp 99.3 F (37.4 C) (Oral)   Resp 18   Ht 6' (1.829 m)   Wt 95.4 kg   SpO2 95%   BMI 28.52 kg/m   Intake/Output Summary (Last 24 hours) at 12/25/2020 1125 Last data filed at 12/25/2020 0900 Gross per 24 hour  Intake 840 ml  Output 3275 ml  Net -2435 ml   Weight change:   Physical Exam: GEN NAD, lying in bed HEENT EOMI PERRL,MMM NECK no  JVD, improved however PULM muffled bibasilar breath sounds CV irregular ABD soft EXT trace LE edema NEURO more awake than yesterday SKIN tanned  Imaging: DG Abd 2 Views  Result Date: 12/24/2020 CLINICAL DATA:  Apparent foreign body in elementary tract EXAM: ABDOMEN - 2 VIEW COMPARISON:  December 23, 2020 and December 22, 2020 FINDINGS: Supine and upright images obtained. There is again noted a metallic foreign body, likely a hearing assist device slightly to the left of midline, unchanged in position, likely in the transverse colon. There is air throughout bowel. There is no appreciable bowel dilatation or air-fluid level to suggest bowel obstruction. No evident free air. Seed implants are noted in the prostate region. IMPRESSION: Apparent hearing assist device in mid abdominal regions slightly to the left of midline, likely in transverse colon, unchanged in position since 2 days prior. No bowel obstruction or free air is evident. Seed implants in prostate. Comment: This radiopaque foreign body appears on current examination to be located inferior to the stomach. Electronically Signed   By: Lowella Grip III M.D.   On: 12/24/2020 12:15    Labs: BMET Recent Labs  Lab 12/21/20 0200 12/21/20 0848 12/21/20 1921 12/22/20 0754 12/23/20 0329 12/24/20 0338 12/25/20 0253  NA 122* 121* 123* 126* 125* 128*  130*  K 4.8 4.6 4.3 4.1 3.7 3.7 3.6  CL 92* 91* 91* 94* 92* 93* 95*  CO2 18* 20* 23 20* 22 22 25   GLUCOSE 107* 86 110* 96 98 167* 149*  BUN 80* 79* 77* 80* 80* 86* 94*  CREATININE 4.32* 4.36* 4.43* 4.43* 4.35* 4.54* 4.65*  CALCIUM 10.5* 10.0 9.9 9.9 9.5 9.7 9.7  PHOS  --   --  5.9*  --   --   --   --    CBC Recent Labs  Lab 12/21/20 0007 12/22/20 0754 12/23/20 0504 12/24/20 0338 12/25/20 0253  WBC 10.3 12.4* 9.1 10.7* 16.6*  NEUTROABS 8.3*  --   --   --   --   HGB 8.7* 9.2* 8.6* 9.0* 8.7*  HCT 25.5* 27.1* 25.0* 25.9* 25.3*  MCV 91.1 90.0 88.7 89.0 91.3  PLT 140* 141* 135* 157 160     Medications:    . allopurinol  100 mg Oral Daily  . aspirin EC  81 mg Oral Daily  . carvedilol  12.5 mg Oral BID WC  . furosemide  80 mg Oral BID  . latanoprost  1 drop Both Eyes QHS  . lidocaine  1 patch Transdermal Q24H  . lidocaine  1 patch Transdermal Q24H  . pantoprazole  40 mg Oral BID AC  . polyethylene glycol  17 g Oral BID  . psyllium  1 packet Oral TID  . rosuvastatin  5 mg Oral Q M,W,F  . sodium chloride flush  3 mL Intravenous Q12H      Madelon Lips, MD 12/25/2020, 11:25 AM

## 2020-12-25 NOTE — Progress Notes (Signed)
Subjective: The patient was shaving as I entered the room. His daughter was present at bedside. He has not had a bowel movement since admission. He has been eating regular food, for example he had pancakes and peaches and sausage for breakfast and cottage cheese and peaches for lunch and plans to eat chicken for dinner. He denies any abdominal pain. Denies nausea or vomiting.  Objective: Vital signs in last 24 hours: Temp:  [97.5 F (36.4 C)-99.3 F (37.4 C)] 99.3 F (37.4 C) (04/10 1000) Pulse Rate:  [61-104] 104 (04/10 0447) Resp:  [18-19] 18 (04/10 0447) BP: (114-130)/(71-87) 114/72 (04/10 1000) SpO2:  [95 %-97 %] 95 % (04/10 0447) Weight change:  Last BM Date: 12/22/20  PE: Elderly, slightly hard of hearing GENERAL: Mild pallor, not in distress ABDOMEN: Soft, nondistended, nontender, normoactive bowel sounds EXTREMITIES: No deformity  Lab Results: Results for orders placed or performed during the hospital encounter of 12/20/20 (from the past 48 hour(s))  Basic metabolic panel     Status: Abnormal   Collection Time: 12/24/20  3:38 AM  Result Value Ref Range   Sodium 128 (L) 135 - 145 mmol/L   Potassium 3.7 3.5 - 5.1 mmol/L   Chloride 93 (L) 98 - 111 mmol/L   CO2 22 22 - 32 mmol/L   Glucose, Bld 167 (H) 70 - 99 mg/dL    Comment: Glucose reference range applies only to samples taken after fasting for at least 8 hours.   BUN 86 (H) 8 - 23 mg/dL   Creatinine, Ser 4.54 (H) 0.61 - 1.24 mg/dL   Calcium 9.7 8.9 - 10.3 mg/dL   GFR, Estimated 12 (L) >60 mL/min    Comment: (NOTE) Calculated using the CKD-EPI Creatinine Equation (2021)    Anion gap 13 5 - 15    Comment: Performed at [facility] [address], [place], [place] [address]  CBC     Status: Abnormal   Collection Time: 12/24/20  3:38 AM  Result Value Ref Range   WBC 10.7 (H) 4.0 - 10.5 K/uL   RBC 2.91 (L) 4.22 - 5.81 MIL/uL   Hemoglobin 9.0 (L) 13.0 - 17.0 g/dL   HCT 25.9 (L) 39.0 - 52.0 %   MCV 89.0  80.0 - 100.0 fL   MCH 30.9 26.0 - 34.0 pg   MCHC 34.7 30.0 - 36.0 g/dL   RDW 15.1 11.5 - 15.5 %   Platelets 157 150 - 400 K/uL   nRBC 0.0 0.0 - 0.2 %    Comment: Performed at [facility], [place] [address], [place], [place] [address]  Basic metabolic panel     Status: Abnormal   Collection Time: 12/25/20  2:53 AM  Result Value Ref Range   Sodium 130 (L) 135 - 145 mmol/L   Potassium 3.6 3.5 - 5.1 mmol/L   Chloride 95 (L) 98 - 111 mmol/L   CO2 25 22 - 32 mmol/L   Glucose, Bld 149 (H) 70 - 99 mg/dL    Comment: Glucose reference range applies only to samples taken after fasting for at least 8 hours.   BUN 94 (H) 8 - 23 mg/dL   Creatinine, Ser 4.65 (H) 0.61 - 1.24 mg/dL   Calcium 9.7 8.9 - 10.3 mg/dL   GFR, Estimated 12 (L) >60 mL/min    Comment: (NOTE) Calculated using the CKD-EPI Creatinine Equation (2021)    Anion gap 10 5 - 15    Comment: Performed at [facility], [address] [place], [place] [address]  CBC     Status: Abnormal   Collection Time: 12/25/20  2:53 AM  Result Value Ref Range   WBC 16.6 (H) 4.0 - 10.5 K/uL   RBC 2.77 (L) 4.22 - 5.81 MIL/uL   Hemoglobin 8.7 (L) 13.0 - 17.0 g/dL   HCT 25.3 (L) 39.0 - 52.0 %   MCV 91.3 80.0 - 100.0 fL   MCH 31.4 26.0 - 34.0 pg   MCHC 34.4 30.0 - 36.0 g/dL   RDW 15.4 11.5 - 15.5 %   Platelets 160 150 - 400 K/uL   nRBC 0.0 0.0 - 0.2 %    Comment: Performed at [facility], [place] [address], [place], [place] [address]  Urinalysis, Routine w reflex microscopic Urine, Random     Status: Abnormal   Collection Time: 12/25/20 12:23 PM  Result Value Ref Range   Color, Urine STRAW (A) YELLOW   APPearance CLEAR CLEAR   Specific Gravity, Urine 1.009 1.005 - 1.030   pH 5.0 5.0 - 8.0   Glucose, UA NEGATIVE NEGATIVE mg/dL   Hgb urine dipstick NEGATIVE NEGATIVE   Bilirubin Urine NEGATIVE NEGATIVE   Ketones, ur NEGATIVE NEGATIVE mg/dL   Protein, ur NEGATIVE NEGATIVE mg/dL   Nitrite NEGATIVE NEGATIVE   Leukocytes,Ua  NEGATIVE NEGATIVE    Comment: Performed at [facility] [address], [place], [place] [address]    Studies/Results: DG Abd 1 View  Result Date: 12/25/2020 CLINICAL DATA:  Serial daily abdominal x-rays in this 87 year old patient who swallowed his hearing aids 5 days ago, as the hearing aids were not identified on EGD 3 days ago. EXAM: PORTABLE ABDOMEN-1 VIEW COMPARISON:  Abdominal x-rays 12/24/2020 and earlier. CT abdomen and pelvis 12/21/2020 FINDINGS: The foreign body (or foreign bodies) corresponding to the ingested hearing aid(s) projects over the central abdomen just to the LEFT of midline, likely in a proximal jejunal loop. Its position is unchanged since the abdominal x-ray 3 days ago. Bowel gas pattern unremarkable without evidence of obstruction or significant ileus. Moderate to large colonic stool burden as before. IMPRESSION: Ingested hearing aid(s) projects over the central abdomen just to the LEFT of midline, likely in a proximal jejunal loop. Its position is unchanged over the past 3 days. Electronically Signed   By: Evangeline Dakin M.D.   On: 12/25/2020 13:35   DG Abd 2 Views  Result Date: 12/24/2020 CLINICAL DATA:  Apparent foreign body in elementary tract EXAM: ABDOMEN - 2 VIEW COMPARISON:  December 23, 2020 and December 22, 2020 FINDINGS: Supine and upright images obtained. There is again noted a metallic foreign body, likely a hearing assist device slightly to the left of midline, unchanged in position, likely in the transverse colon. There is air throughout bowel. There is no appreciable bowel dilatation or air-fluid level to suggest bowel obstruction. No evident free air. Seed implants are noted in the prostate region. IMPRESSION: Apparent hearing assist device in mid abdominal regions slightly to the left of midline, likely in transverse colon, unchanged in position since 2 days prior. No bowel obstruction or free air is evident. Seed implants in prostate. Comment: This radiopaque  foreign body appears on current examination to be located inferior to the stomach. Electronically Signed   By: Lowella Grip III M.D.   On: 12/24/2020 12:15    Medications: I have reviewed the patient's current medications.  Assessment: Swallowed hearing aid with a button battery inside On serial x-ray it appears to  be in proximal jejunal loop and position is unchanged in the past 3 days Despite being on a regular diet and starting patient on MiraLAX 17 g twice a day and Metamucil 1 packet 3 times a day he has not had any bowel movement since admission.  Plan: We will give magnesium citrate x1 bottle today. Repeat abdominal x-ray in a.m.. Encourage mobilization, out of bed to chair, walk around in the hallways with assistance if feasible, limit use of narcotics or anticholinergics.  Ronnette Juniper, MD 12/25/2020, 3:35 PM

## 2020-12-26 ENCOUNTER — Encounter (HOSPITAL_COMMUNITY): Payer: Self-pay | Admitting: Gastroenterology

## 2020-12-26 ENCOUNTER — Inpatient Hospital Stay (HOSPITAL_COMMUNITY): Payer: Medicare Other

## 2020-12-26 DIAGNOSIS — S2243XA Multiple fractures of ribs, bilateral, initial encounter for closed fracture: Secondary | ICD-10-CM | POA: Diagnosis not present

## 2020-12-26 LAB — BASIC METABOLIC PANEL
Anion gap: 9 (ref 5–15)
BUN: 87 mg/dL — ABNORMAL HIGH (ref 8–23)
CO2: 29 mmol/L (ref 22–32)
Calcium: 9.7 mg/dL (ref 8.9–10.3)
Chloride: 97 mmol/L — ABNORMAL LOW (ref 98–111)
Creatinine, Ser: 4.3 mg/dL — ABNORMAL HIGH (ref 0.61–1.24)
GFR, Estimated: 13 mL/min — ABNORMAL LOW (ref >60)
Glucose, Bld: 118 mg/dL — ABNORMAL HIGH (ref 70–99)
Potassium: 3.2 mmol/L — ABNORMAL LOW (ref 3.5–5.1)
Sodium: 135 mmol/L (ref 135–145)

## 2020-12-26 LAB — CBC
HCT: 24.6 % — ABNORMAL LOW (ref 39.0–52.0)
Hemoglobin: 8.4 g/dL — ABNORMAL LOW (ref 13.0–17.0)
MCH: 31.1 pg (ref 26.0–34.0)
MCHC: 34.1 g/dL (ref 30.0–36.0)
MCV: 91.1 fL (ref 80.0–100.0)
Platelets: 153 10*3/uL (ref 150–400)
RBC: 2.7 MIL/uL — ABNORMAL LOW (ref 4.22–5.81)
RDW: 16 % — ABNORMAL HIGH (ref 11.5–15.5)
WBC: 10.3 10*3/uL (ref 4.0–10.5)
nRBC: 0 % (ref 0.0–0.2)

## 2020-12-26 LAB — GLUCOSE, CAPILLARY: Glucose-Capillary: 116 mg/dL — ABNORMAL HIGH (ref 70–99)

## 2020-12-26 IMAGING — DX DG ABDOMEN 1V
1 series · 1 of 1 positions shown · non-contrast
Comparison: [DATE]

CLINICAL DATA: Ingested foreign body

EXAM:
ABDOMEN - 1 VIEW

[abdomen]
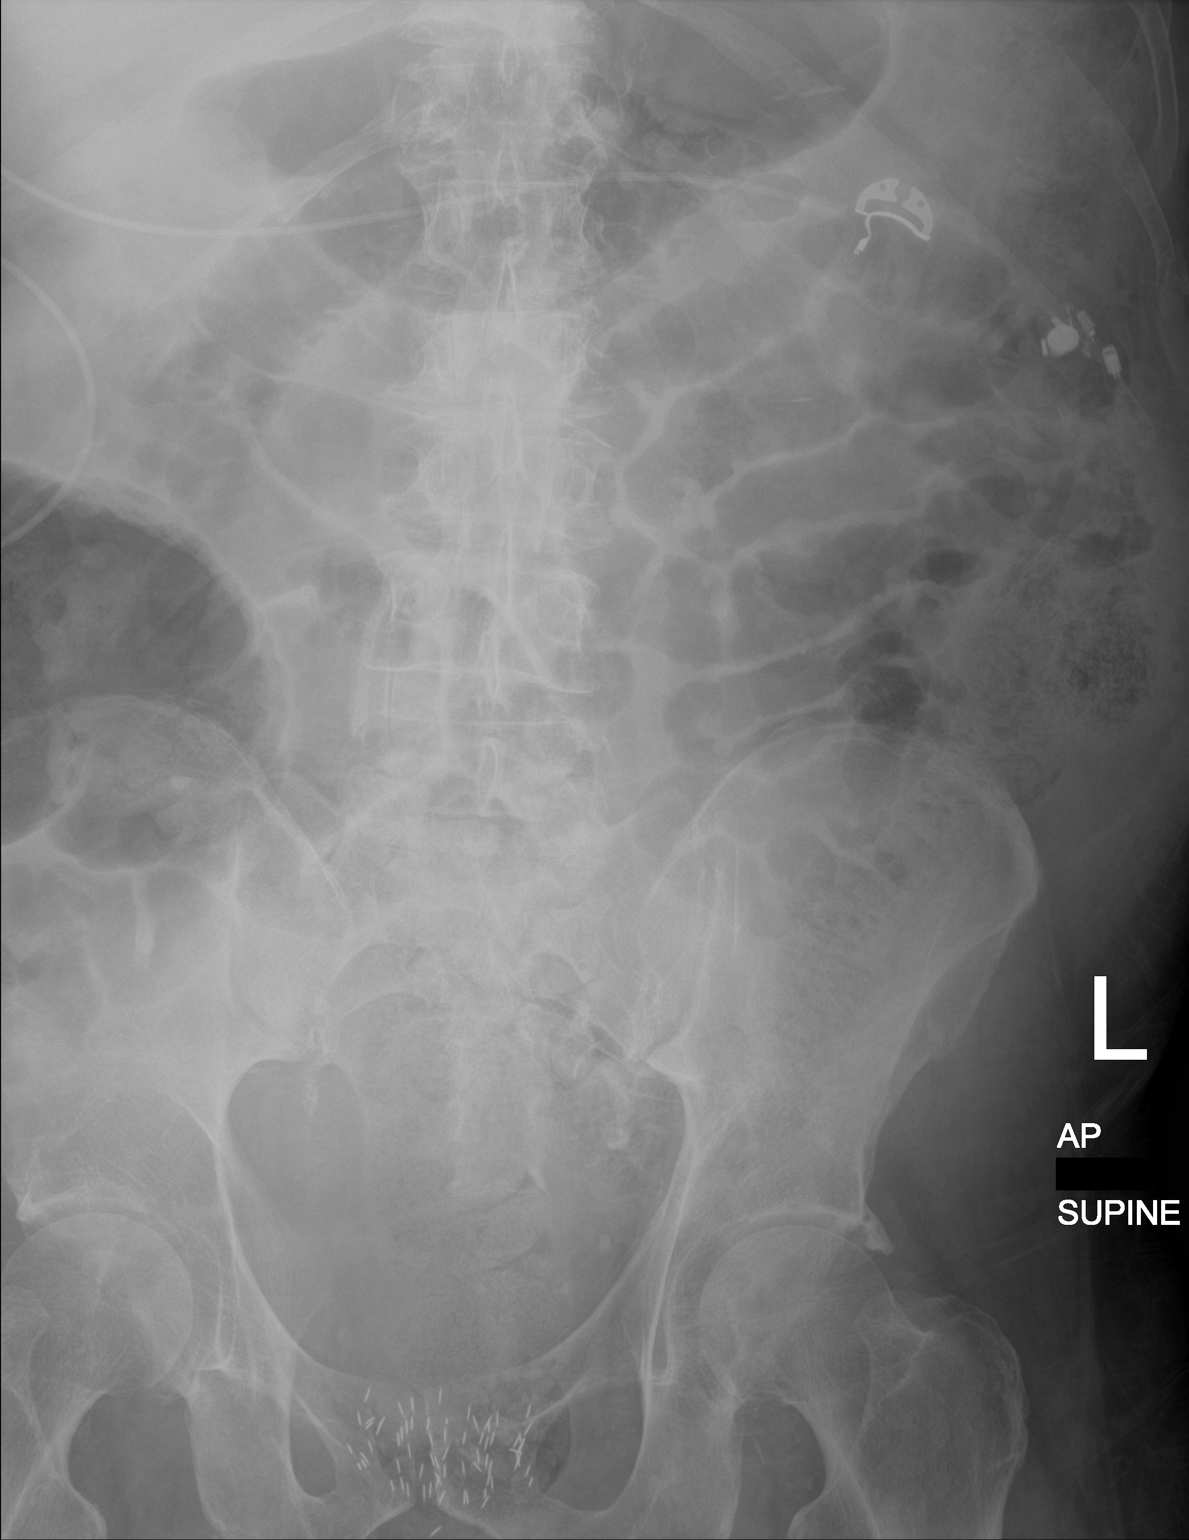

[1 of 1 positions shown; findings below may reference images not displayed]

FINDINGS: The clustered metallic densities noted on prior examination now
overlies the descending colon within the left mid abdomen. Normal
abdominal gas pattern. No gross free intraperitoneal gas.
Brachytherapy seeds overlie the prostate gland. Vascular
calcifications are seen within the pelvis. No acute bone
abnormality.
IMPRESSION: Metallic foreign bodies likely within the descending colon.

## 2020-12-26 MED ORDER — MAGNESIUM CITRATE PO SOLN
1.0000 | Freq: Once | ORAL | Status: DC
  Filled 2020-12-26: qty 296

## 2020-12-26 NOTE — Significant Event (Signed)
Rapid Response Event Note   Reason for Call :  Code blue called when patient had syncopal episode on the bedside commode.    Initial Focused Assessment:  Upon my arrival patient is alert and oriented lying in the bed. BP 104/68  HR 54  RR 20  O2 sat 100% on RA Lung sounds decreased bases Heart tones irregular  Daughter at bedside  Repeat BP 124/64  Interventions:    Plan of Care:     Event Summary:   MD Notified: Dr Antonieta Pert Call Time: [phone] Arrival Time: 6811 End Time: 5726  Raliegh Ip, RN

## 2020-12-26 NOTE — Progress Notes (Signed)
Patient had a vagal episode on the bedside commode while passing stool, nursing staff assisted patient back to bed and stabilized patient. MD and rapid response was notified, MD gave verbal order to hold AM coreg and to hold lasix until nephrology visits with the patient today. Latest set of vitals were taken at 8;26am and noted in the chart.

## 2020-12-26 NOTE — Progress Notes (Signed)
[facility] Progress Note  [name] [name] 87 y.o. May 28, 1933   Subjective: Resting in bed. Denies abdominal pain. Reports having a stool today (does not know appearance and no report in flowsheet). Syncopal episode on bedside commode this morning. Daughter in room.  Objective: Vital signs: Vitals:   12/26/20 0424 12/26/20 0826  BP: 114/73 124/64  Pulse: 73 (!) 59  Resp: 16 20  Temp: (!) 97.5 F (36.4 C) (!) 97.3 F (36.3 C)  SpO2: 95% 99%    Physical Exam: Gen: lethargic, elderly, no acute distress, pleasant, hard of hearing HEENT: anicteric sclera CV: RRR Chest: CTA B Abd: soft, nontender, nondistended, +BS Ext: no edema  Lab Results: Recent Labs    12/25/20 0253 12/26/20 0326  NA 130* 135  K 3.6 3.2*  CL 95* 97*  CO2 25 29  GLUCOSE 149* 118*  BUN 94* 87*  CREATININE 4.65* 4.30*  CALCIUM 9.7 9.7   No results for input(s): AST, ALT, ALKPHOS, BILITOT, PROT, ALBUMIN in the last 72 hours. Recent Labs    12/25/20 0253 12/26/20 0326  WBC 16.6* 10.3  HGB 8.7* 8.4*  HCT 25.3* 24.6*  MCV 91.3 91.1  PLT 160 153      Assessment/Plan: Gastric ulcer (clean-based) - Check H. Pylori serology. Continue to hold Eliquis until 12/28/20 if possible. Continue PPI PO BID.  Foreign body in gut - hearing aid has advanced to the left colon per imaging. Give another dose of MgCitrate. Hopefully it will be evacuated in the next 1-2 days. Will f/u.   Lear Ng 12/26/2020, 10:36 AM  Questions please call [phone] ID: [name] [name], male   DOB: Aug 31, 1933, 87 y.o.   MRN: [id number]

## 2020-12-26 NOTE — Progress Notes (Signed)
PROGRESS NOTE    [name] [name]  HWK:[id number] DOB: 02/20/1933 DOA: 12/20/2020 PCP: Antony Contras, MD   Chief Complaint  Patient presents with  . Fall    Taking Eliquis  Brief Narrative:  87 old male with history of hypertension, A. fib on Eliquis, CAD history of PCI in 9458, chronic systolic CHF EF [id number], GERD, chronic kidney disease stage III brought to the ED after fall at home.  He has had recent falls and while at the shower on 4/5 he fell.  Patient reported that he lost balance. Per report denies any loss of consciousness or trauma to his head. He has been ambulating with use of a walker.  Apparently patient has had severe back pain and sciatica for which he has been seen by physical therapy and has a scheduled appointment with orthopedics.  Due to the pain in his back he has not been sleeping well at night  In the ED,he was afebrile, saturating well on room air, CT scan of the head and cervical spine showed no acute abnormality, but did note a partially visualized right-sided pleural effusion.  Labs significant for for hemoglobin 8.7, platelets 140, sodium 122, chloride 92, CO2 20, BUN 80, creatinine 4.45, calcium 10.8, AST 63, serum osmolarity 287, and TSH 2.577.  Urinalysis was positive for 30 mg/dL protein, but showed no significant signs of infection.  CT scan of the chest for bilateral nondisplaced rib fractures, a metallic foreign body in the gastric body measuring 2.1 cm possibly hearing aid, possible duodenitis/peptic ulcer disease, cardiomegaly with signs of fluid overload with pulmonary edema, and a 4.2 cm ascending aortic aneurysm.  patient was given normal saline IVFs at 75 ml/hr, Patient was admitted and nephrology, gastroenterology and cardiology were consulted. Patient became lethargic after getting morphine in the ED. Patient was followed by GI nephrology cardiology. Underwent EGD that showed stomach ulcer no foreign body  xray 4/9-unchanged portion of the foreign  body appears to be in transverse colon  Subjective: Patient seen urgently after he had Vagal episode this am while having BM on bedside coomode. Patient was sleepy/not responding on the commode was brought back to the bed and was alert awake oriented.  When I came in he was sleeping woke up he does not recall the event. His daughter is at the bedside.  He is hard of hearing. Able to move all his extremities  Assessment & Plan:  Multiple falls at home Chronic low back-scheduled to see [facility] 4/18 as outpatient. He has had at least 4  Falls at home  likely multifactorial in setting of hyponatremia and fluid overload chronic low back pain and physical deconditioning.  Continue supportive measures, diuresis fluid management.  Is overall nonfocal on exam moving lower extremities well has pain issues all over but reluctant to take any narcotics, being managed with Tylenol.  I have added half dose of tramadol incase of severe pain.  Continue PT OT.  Continue lidocaine patch. Can consider MRI of the spine if able to retrieve the foreign body from the stoma w/ EGD or he will need to follow-up with scheduled outpatient follow-up.  Vasovagal episode/syncope while having BM: Nonfocal on exam, brief episode, blood pressure soft in 104 his blood pressure has been soft previously-his Coreg was cut from 25 mg to 6.25 mg previously 2/2 soft bp to allow room for lasix.Previously amlodipine has been discontinued.  Will monitor for orthostatic vitals, he has been diuresed and, has had - 10 liter balance since admission.Await nephrology  recommendation on his Lasix this morning.   HTN: Blood pressure had been soft, off amlodipine, hold Coreg this morning.  Coreg dose was decreased from 25 mg to 6.25 mg previously 2/2 soft bp to allow room for lasix.    Foreign body-hearing aid in  GI tract: Unable to be retrieved with EGD.  X-ray showed for eign body likely in transverse colon.  Appreciate GI input-started  MiraLAX 17 twice daily, Metamucil 1 packet 3 times daily-and given mag citrate x-ray this morning shows foreign body in the descending colon. Monitor w/ serial x-rays and abdominal exam.  Duodenitis/PUD/nonbleeding gastric ulcer in  EGD 4/7.GI advised to hold anticoagulation/Eliquis until 12/28/20- Continue PPI twice daily.  H. pylori serology pending  Multiple fractures of ribs,bilateral,due to fall: Continue current pain control steroids as able avoid incentive spirometry.    Acute encephalopathy following IV morphine in the ED. resolved.  Avoid narcotics.  Hyponatremia: Suspect hypervolemic hyponatremia.  It has resolved with diuresis and fluid restriction 1.2 L.  Appreciate nephrology inpu Recent Labs  Lab 12/22/20 0754 12/23/20 0329 12/24/20 0338 12/25/20 0253 12/26/20 0326  NA 126* 125* 128* 130* 135   AKI on CKD 3b-PCP records shows baseline creatinine 4/21 was 3.8. No value since then, possibly could have progressed since and holding steady in 4.3 range- CKD stage 5, likely new baseline.his CT shows bilateral complex renal cyst. Nephrology on board and appreciate input.  Menses stable slightly uptrending BUN, creatinine in a persistent evaluation 4.3- 4.6 range.  Lasix has been changed to p.o. by nephrology.  Having good urine output.  Recent Labs  Lab 12/22/20 0754 12/23/20 0329 12/24/20 0338 12/25/20 0253 12/26/20 0326  BUN 80* 80* 86* 94* 87*  CREATININE 4.43* 4.35* 4.54* 4.65* 4.30*  Net IO Since Admission: -10,325 mL [12/26/20 0822]  Intake/Output Summary (Last 24 hours) at 12/26/2020 1607 Last data filed at 12/26/2020 0600 Gross per 24 hour  Intake 680 ml  Output 2500 ml  Net -1820 ml   Iron deficiency anemia:guaiac was noted to be negative.  Hemoglobin appears stable at 8- 9 g.   Recent Labs  Lab 12/22/20 0754 12/23/20 0504 12/24/20 0338 12/25/20 0253 12/26/20 0326  HGB 9.2* 8.6* 9.0* 8.7* 8.4*  HCT 27.1* 25.0* 25.9* 25.3* 24.6*   Leukocytosis, unclear  etiology could be reactive but has resolved this morning.  Patient denied urinary symptoms UA fairly stable.  Recent Labs  Lab 12/22/20 0754 12/23/20 0504 12/24/20 0338 12/25/20 0253 12/26/20 0326  WBC 12.4* 9.1 10.7* 16.6* 10.3   CAD S/P angioplasty 2013:trop neg 14>13.continue his aspirin, beta-blocker and statin.  Acute on chronic CHF exacerbation w/ preserved EF:BNP elevated hypervolemic.echo shows normal LV function suspect CHF with preserved EF.  -10.3 L since admission.  Lasix changed to p.o 80 mg bid (4/10).  Appreciate cardiology nephrology input continue to monitor intake output. Net IO Since Admission: -10,325 mL [12/26/20 0822]  Filed Weights   12/20/20 2325 12/24/20 0357 12/25/20 2026  Weight: 115 kg 95.4 kg 97.6 kg   Permanent A. fib on Eliquis now with multiple falls rate controlled on Coreg. DOAC is held for EGD and  Now 2/2 stmomach ulcer- GI advised to hold for 5 days.Plan to resume as soon as okay with GI.Patient is here for temporarily from [place] and is followed by cardiologist in [place].  Thrombocytopenia: resolved  Recent Labs  Lab 12/22/20 0754 12/23/20 0504 12/24/20 0338 12/25/20 0253 12/26/20 0326  PLT 141* 135* 157 160 153   Hypercalcemia: Calcium level  is stable 9.9.  Hyperlipidemia continue statin.   MorBid Obesity BMI 34: Patient will benefit with weight loss PCP follow-up.  Diet Order            DIET SOFT Room service appropriate? Yes; Fluid consistency: Thin; Fluid restriction: 1200 mL Fluid  Diet effective now               Patient's Body mass index is 29.18 kg/m.  DVT prophylaxis: SCD.  Anticoagulation on hold due to gastric ulcer plan to resume on 4/13 Code Status:   Code Status: Full Code  Family Communication: plan of care discussed with patient and his son at bedside previously. Discussed with patient's daughter at the bedside 4/9  and with another daughter 4/10. Updated at bedside this am  Status is: Inpatient Remains  inpatient appropriate because:IV treatments appropriate due to intensity of illness or inability to take PO and Inpatient level of care appropriate due to severity of illness  Dispo: The patient is from: Home              Anticipated d/c is to: Skilled nursing facility.  Discussed w/ the nursing staff to have PT work with him today.              Patient currently is not medically stable to d/c.   Difficult to place patient No Unresulted Labs (From admission, onward)          Start     Ordered   12/26/20 0500  CBC  Daily,   R      12/25/20 0846        Medications reviewed:  Scheduled Meds: . allopurinol  100 mg Oral Daily  . aspirin EC  81 mg Oral Daily  . carvedilol  6.25 mg Oral BID WC  . furosemide  80 mg Oral BID  . latanoprost  1 drop Both Eyes QHS  . lidocaine  1 patch Transdermal Q24H  . lidocaine  1 patch Transdermal Q24H  . pantoprazole  40 mg Oral BID AC  . polyethylene glycol  17 g Oral BID  . psyllium  1 packet Oral TID  . rosuvastatin  5 mg Oral Q M,W,F  . sodium chloride flush  3 mL Intravenous Q12H   Continuous Infusions: . sodium chloride      Consultants: Nephrology, cardiology GI   Procedures:see note EGD Medium-sized hiatal hernia. - Non-bleeding gastric ulcer with no stigmata of bleeding. - Gastritis. - Duodenitis. - No foreign body seen to the extent of our examination (D2). Impression: None Moderate Sedation: - Return patient to hospital ward for ongoing care. - Soft diet today. - Continue present medications. - Based on finding of ulcer, would hold Apixaban for another 5 days, if at all possible. - Pantoprazole 40 mg po bid x 6 weeks, then 40 mg po qd thereafter indefinitely. - Perform an H. pylori serology tomorrow. - Miralax 17 grams po bid, to facilitate passage of foreign body. - Serial (daily) abdominal xrays. - [facility] will follow  Antimicrobials: Anti-infectives (From admission, onward)   None     Culture/Microbiology No  results found for: SDES, SPECREQUEST, CULT, REPTSTATUS  Other culture-see note  Objective: Vitals: Today's Vitals   12/26/20 0128 12/26/20 0232 12/26/20 0317 12/26/20 0424  BP:    114/73  Pulse:    73  Resp:    16  Temp:    (!) 97.5 F (36.4 C)  TempSrc:    Oral  SpO2:    95%  Weight:      Height:      PainSc: 2  10-Worst pain ever 2      Intake/Output Summary (Last 24 hours) at 12/26/2020 0822 Last data filed at 12/26/2020 0600 Gross per 24 hour  Intake 680 ml  Output 2500 ml  Net -1820 ml   Filed Weights   12/20/20 2325 12/24/20 0357 12/25/20 2026  Weight: 115 kg 95.4 kg 97.6 kg   Weight change:   Intake/Output from previous day: 04/10 0701 - 04/11 0700 In: 680 [P.O.:680] Out: 2500 [Urine:2500] Intake/Output this shift: No intake/output data recorded. Filed Weights   12/20/20 2325 12/24/20 0357 12/25/20 2026  Weight: 115 kg 95.4 kg 97.6 kg    Examination: General exam: AAOx3, heard of hearing,NAD, weak appearing. HEENT:Oral mucosa moist, Ear/Nose WNL grossly, dentition normal. Respiratory system: bilaterally clear,no wheezing or crackles,no use of accessory muscle Cardiovascular system: S1 & S2 +, No JVD,. Gastrointestinal system: Abdomen soft, NT,ND, BS+ Nervous System:Alert, awake, moving extremities and grossly nonfocal on exam no neck stiffness. Extremities: trace edema, distal peripheral pulses palpable.  Skin: No rashes,no icterus. MSK: Normal muscle bulk,tone, power   Data Reviewed: I have personally reviewed following labs and imaging studies CBC: Recent Labs  Lab 12/21/20 0007 12/22/20 0754 12/23/20 0504 12/24/20 0338 12/25/20 0253 12/26/20 0326  WBC 10.3 12.4* 9.1 10.7* 16.6* 10.3  NEUTROABS 8.3*  --   --   --   --   --   HGB 8.7* 9.2* 8.6* 9.0* 8.7* 8.4*  HCT 25.5* 27.1* 25.0* 25.9* 25.3* 24.6*  MCV 91.1 90.0 88.7 89.0 91.3 91.1  PLT 140* 141* 135* 157 160 170   Basic Metabolic Panel: Recent Labs  Lab 12/21/20 1921 12/22/20 0754  12/23/20 0329 12/24/20 0338 12/25/20 0253 12/26/20 0326  NA 123* 126* 125* 128* 130* 135  K 4.3 4.1 3.7 3.7 3.6 3.2*  CL 91* 94* 92* 93* 95* 97*  CO2 23 20* 22 22 25 29   GLUCOSE 110* 96 98 167* 149* 118*  BUN 77* 80* 80* 86* 94* 87*  CREATININE 4.43* 4.43* 4.35* 4.54* 4.65* 4.30*  CALCIUM 9.9 9.9 9.5 9.7 9.7 9.7  PHOS 5.9*  --   --   --   --   --    GFR: Estimated Creatinine Clearance: 15.2 mL/min (A) (by C-G formula based on SCr of 4.3 mg/dL (H)). Liver Function Tests: Recent Labs  Lab 12/21/20 0007 12/21/20 1921  AST 63*  --   ALT 41  --   ALKPHOS 84  --   BILITOT 1.3*  --   PROT 5.8*  --   ALBUMIN 3.2* 3.0*   Recent Labs  Lab 12/21/20 0007  LIPASE 41   No results for input(s): AMMONIA in the last 168 hours. Coagulation Profile: No results for input(s): INR, PROTIME in the last 168 hours. Cardiac Enzymes: Recent Labs  Lab 12/21/20 0848  CKTOTAL 480*   BNP (last 3 results) No results for input(s): PROBNP in the last 8760 hours. HbA1C: No results for input(s): HGBA1C in the last 72 hours. CBG: Recent Labs  Lab 12/26/20 0813  GLUCAP 116*   Lipid Profile: No results for input(s): CHOL, HDL, LDLCALC, TRIG, CHOLHDL, LDLDIRECT in the last 72 hours. Thyroid Function Tests: No results for input(s): TSH, T4TOTAL, FREET4, T3FREE, THYROIDAB in the last 72 hours. Anemia Panel: No results for input(s): VITAMINB12, FOLATE, FERRITIN, TIBC, IRON, RETICCTPCT in the last 72 hours. Sepsis Labs: No results for input(s): PROCALCITON, LATICACIDVEN in the last 168  hours.  Recent Results (from the past 240 hour(s))  Resp Panel by RT-PCR (Flu A&B, Covid) Nasopharyngeal Swab     Status: None   Collection Time: 12/21/20  2:45 AM   Specimen: Nasopharyngeal Swab; Nasopharyngeal(NP) swabs in vial transport medium  Result Value Ref Range Status   SARS Coronavirus 2 by RT PCR NEGATIVE NEGATIVE Final    Comment: (NOTE) SARS-CoV-2 target nucleic acids are NOT DETECTED.  The  SARS-CoV-2 RNA is generally detectable in upper respiratory specimens during the acute phase of infection. The lowest concentration of SARS-CoV-2 viral copies this assay can detect is 138 copies/mL. A negative result does not preclude SARS-Cov-2 infection and should not be used as the sole basis for treatment or other patient management decisions. A negative result may occur with  improper specimen collection/handling, submission of specimen other than nasopharyngeal swab, presence of viral mutation(s) within the areas targeted by this assay, and inadequate number of viral copies(<138 copies/mL). A negative result must be combined with clinical observations, patient history, and epidemiological information. The expected result is Negative.  Fact Sheet for Patients:  EntrepreneurPulse.com.au  Fact Sheet for Healthcare Providers:  IncredibleEmployment.be  This test is no t yet approved or cleared by the [place] FDA and  has been authorized for detection and/or diagnosis of SARS-CoV-2 by FDA under an Emergency Use Authorization (EUA). This EUA will remain  in effect (meaning this test can be used) for the duration of the COVID-19 declaration under Section 564(b)(1) of the Act, 21 U.S.C.section 360bbb-3(b)(1), unless the authorization is terminated  or revoked sooner.       Influenza A by PCR NEGATIVE NEGATIVE Final   Influenza B by PCR NEGATIVE NEGATIVE Final    Comment: (NOTE) The Xpert Xpress SARS-CoV-2/FLU/RSV plus assay is intended as an aid in the diagnosis of influenza from Nasopharyngeal swab specimens and should not be used as a sole basis for treatment. Nasal washings and aspirates are unacceptable for Xpert Xpress SARS-CoV-2/FLU/RSV testing.  Fact Sheet for Patients: EntrepreneurPulse.com.au  Fact Sheet for Healthcare Providers: IncredibleEmployment.be  This test is not yet approved or  cleared by the [place] FDA and has been authorized for detection and/or diagnosis of SARS-CoV-2 by FDA under an Emergency Use Authorization (EUA). This EUA will remain in effect (meaning this test can be used) for the duration of the COVID-19 declaration under Section 564(b)(1) of the Act, 21 U.S.C. section 360bbb-3(b)(1), unless the authorization is terminated or revoked.  Performed at [facility], [place] [address], [place], [place] [address]      Radiology Studies: DG Abd 1 View  Result Date: 12/26/2020 CLINICAL DATA:  Ingested foreign body EXAM: ABDOMEN - 1 VIEW COMPARISON:  12/25/2020 FINDINGS: The clustered metallic densities noted on prior examination now overlies the descending colon within the left mid abdomen. Normal abdominal gas pattern. No gross free intraperitoneal gas. Brachytherapy seeds overlie the prostate gland. Vascular calcifications are seen within the pelvis. No acute bone abnormality. IMPRESSION: Metallic foreign bodies likely within the descending colon. Electronically Signed   By: Fidela Salisbury MD   On: 12/26/2020 06:02   DG Abd 1 View  Result Date: 12/25/2020 CLINICAL DATA:  Serial daily abdominal x-rays in this 87 year old patient who swallowed his hearing aids 5 days ago, as the hearing aids were not identified on EGD 3 days ago. EXAM: PORTABLE ABDOMEN-1 VIEW COMPARISON:  Abdominal x-rays 12/24/2020 and earlier. CT abdomen and pelvis 12/21/2020 FINDINGS: The foreign body (or foreign bodies) corresponding to the ingested  hearing aid(s) projects over the central abdomen just to the LEFT of midline, likely in a proximal jejunal loop. Its position is unchanged since the abdominal x-ray 3 days ago. Bowel gas pattern unremarkable without evidence of obstruction or significant ileus. Moderate to large colonic stool burden as before. IMPRESSION: Ingested hearing aid(s) projects over the central abdomen just to the LEFT of midline, likely in a proximal jejunal loop.  Its position is unchanged over the past 3 days. Electronically Signed   By: Evangeline Dakin M.D.   On: 12/25/2020 13:35   DG Abd 2 Views  Result Date: 12/24/2020 CLINICAL DATA:  Apparent foreign body in elementary tract EXAM: ABDOMEN - 2 VIEW COMPARISON:  December 23, 2020 and December 22, 2020 FINDINGS: Supine and upright images obtained. There is again noted a metallic foreign body, likely a hearing assist device slightly to the left of midline, unchanged in position, likely in the transverse colon. There is air throughout bowel. There is no appreciable bowel dilatation or air-fluid level to suggest bowel obstruction. No evident free air. Seed implants are noted in the prostate region. IMPRESSION: Apparent hearing assist device in mid abdominal regions slightly to the left of midline, likely in transverse colon, unchanged in position since 2 days prior. No bowel obstruction or free air is evident. Seed implants in prostate. Comment: This radiopaque foreign body appears on current examination to be located inferior to the stomach. Electronically Signed   By: Lowella Grip III M.D.   On: 12/24/2020 12:15     LOS: 5 days   Antonieta Pert, MD [facility]  12/26/2020, 8:22 AM

## 2020-12-26 NOTE — Progress Notes (Signed)
Physical Therapy Treatment Patient Details Name: [name] [name] MRN: [id number] DOB: 1933/05/28 Today's Date: 12/26/2020    History of Present Illness Pt adm 4/5 with falls and hyponatremia. Pt found to have bilateral nondisplaced anterior rib fx's. Pt also inadvertantly had swallowed his hearing aide. GI to retrieve hearing aide. PMH - HTN, afib, cad, chf, ckd, prostate CA    PT Comments    Continuing work on functional mobility and activity tolerance;  Session focused on obtaining Serial BPs for a closer look at activity tolerance and upright tolerance; Notable BP drop in standing (see below); limited also by frequent soft brown stooling     12/26/20 1515  Vital Signs  Patient Position (if appropriate) Orthostatic Vitals  Orthostatic Lying   BP- Lying 119/67 (Map 81)  Pulse- Lying 55  Orthostatic Sitting  BP- Sitting 110/68 (Map 80)  Pulse- Sitting 76  Orthostatic Standing at 0 minutes  BP- Standing at 0 minutes (!) 80/70 (Map 75)  Pulse- Standing at 0 minutes 78  Orthostatic Standing at 3 minutes  BP- Standing at 3 minutes  (Unable to stand long enough to obtain)   Jettson Art, RN, aware  Follow Up Recommendations  SNF     Equipment Recommendations  Wheelchair (measurements PT);Wheelchair cushion (measurements PT)    Recommendations for Other Services       Precautions / Restrictions Precautions Precautions: Fall Precaution Comments: son reports about 5 falls in the past 3 months    Mobility  Bed Mobility Overal bed mobility: Needs Assistance Bed Mobility: Rolling;Sidelying to Sit;Sit to Sidelying Rolling: Min assist Sidelying to sit: Mod assist;HOB elevated     Sit to sidelying: Mod assist General bed mobility comments: Cues to reach for bed rails to pull into sidelyingAssist to bring legs off of bed and elevate trunk into sitting. Assist to bring legs back up into bed returning to sidelying    Transfers Overall transfer level: Needs assistance Equipment  used: Rolling walker (2 wheeled) Transfers: Sit to/from Stand Sit to Stand: Mod assist;+2 safety/equipment         General transfer comment: Cues for hand placement; Light mod assist to power up; cues to self-monitor for lightheadedness/presyncopy  Ambulation/Gait Ambulation/Gait assistance: Min assist;+2 safety/equipment Gait Distance (Feet): 2 Feet (sidesteps towards [facility]) Assistive device: Rolling walker (2 wheeled)       General Gait Details: Assist for balance and suport; cues to self-monitor for activity tolerance   Stairs             Wheelchair Mobility    Modified Rankin (Stroke Patients Only)       Balance     Sitting balance-Leahy Scale: Fair       Standing balance-Leahy Scale: Poor                              Cognition Arousal/Alertness: Awake/alert Behavior During Therapy: WFL for tasks assessed/performed Overall Cognitive Status: Impaired/Different from baseline                               Problem Solving: Requires verbal cues;Requires tactile cues;Slow processing;Difficulty sequencing        Exercises      General Comments General comments (skin integrity, edema, etc.): Session compicated by the need to move his bowels frequently, often with every motion; often stopped to clean up      Pertinent Vitals/Pain Pain Assessment: Faces Faces Pain  Scale: Hurts even more Pain Location: Shoulders with any touch Pain Descriptors / Indicators: Guarding;Grimacing Pain Intervention(s): Monitored during session    Home Living                      Prior Function            PT Goals (current goals can now be found in the care plan section) Acute Rehab PT Goals Patient Stated Goal: become mobile PT Goal Formulation: With patient/family Time For Goal Achievement: 01/05/21 Potential to Achieve Goals: Good Progress towards PT goals: Progressing toward goals (slowly; improving balance, however activity  tolerance limited by BP drop in standing)    Frequency    Min 2X/week      PT Plan Current plan remains appropriate    Co-evaluation              AM-PAC PT "6 Clicks" Mobility   Outcome Measure  Help needed turning from your back to your side while in a flat bed without using bedrails?: A Little Help needed moving from lying on your back to sitting on the side of a flat bed without using bedrails?: A Lot Help needed moving to and from a bed to a chair (including a wheelchair)?: A Lot Help needed standing up from a chair using your arms (e.g., wheelchair or bedside chair)?: A Lot Help needed to walk in hospital room?: A Lot Help needed climbing 3-5 steps with a railing? : Total 6 Click Score: 12    End of Session Equipment Utilized During Treatment: Gait belt Activity Tolerance: Patient limited by fatigue Patient left: in bed;with call bell/phone within reach;with bed alarm set;with family/visitor present Nurse Communication: Mobility status;Other (comment) (Serial BPs) PT Visit Diagnosis: Other abnormalities of gait and mobility (R26.89);Muscle weakness (generalized) (M62.81);History of falling (Z91.81)     Time: 1501-1540 PT Time Calculation (min) (ACUTE ONLY): 39 min  Charges:  $Therapeutic Activity: 38-52 mins                     Roney Marion, PT  Acute Rehabilitation Services Pager [phone] Office [place] 12/26/2020, 8:02 PM

## 2020-12-27 ENCOUNTER — Inpatient Hospital Stay (HOSPITAL_COMMUNITY): Payer: Medicare Other

## 2020-12-27 DIAGNOSIS — S2243XA Multiple fractures of ribs, bilateral, initial encounter for closed fracture: Secondary | ICD-10-CM | POA: Diagnosis not present

## 2020-12-27 LAB — CBC
HCT: 26.3 % — ABNORMAL LOW (ref 39.0–52.0)
Hemoglobin: 8.9 g/dL — ABNORMAL LOW (ref 13.0–17.0)
MCH: 31.1 pg (ref 26.0–34.0)
MCHC: 33.8 g/dL (ref 30.0–36.0)
MCV: 92 fL (ref 80.0–100.0)
Platelets: 159 10*3/uL (ref 150–400)
RBC: 2.86 MIL/uL — ABNORMAL LOW (ref 4.22–5.81)
RDW: 16 % — ABNORMAL HIGH (ref 11.5–15.5)
WBC: 11.4 10*3/uL — ABNORMAL HIGH (ref 4.0–10.5)
nRBC: 0 % (ref 0.0–0.2)

## 2020-12-27 LAB — BASIC METABOLIC PANEL
Anion gap: 9 (ref 5–15)
BUN: 82 mg/dL — ABNORMAL HIGH (ref 8–23)
CO2: 30 mmol/L (ref 22–32)
Calcium: 9.5 mg/dL (ref 8.9–10.3)
Chloride: 97 mmol/L — ABNORMAL LOW (ref 98–111)
Creatinine, Ser: 4.11 mg/dL — ABNORMAL HIGH (ref 0.61–1.24)
GFR, Estimated: 14 mL/min — ABNORMAL LOW (ref >60)
Glucose, Bld: 107 mg/dL — ABNORMAL HIGH (ref 70–99)
Potassium: 3.5 mmol/L (ref 3.5–5.1)
Sodium: 136 mmol/L (ref 135–145)

## 2020-12-27 LAB — H. PYLORI ANTIBODY, IGG: H Pylori IgG: 0.26 Index Value (ref 0.00–0.79)

## 2020-12-27 IMAGING — DX DG ABDOMEN 1V
3 series · 3 of 3 positions shown · non-contrast
Comparison: [DATE]

CLINICAL DATA: Evaluate for foreign body. Patient swallowed hearing
aid.

EXAM:
ABDOMEN - 1 VIEW

[abdomen supine (1 of 3)]
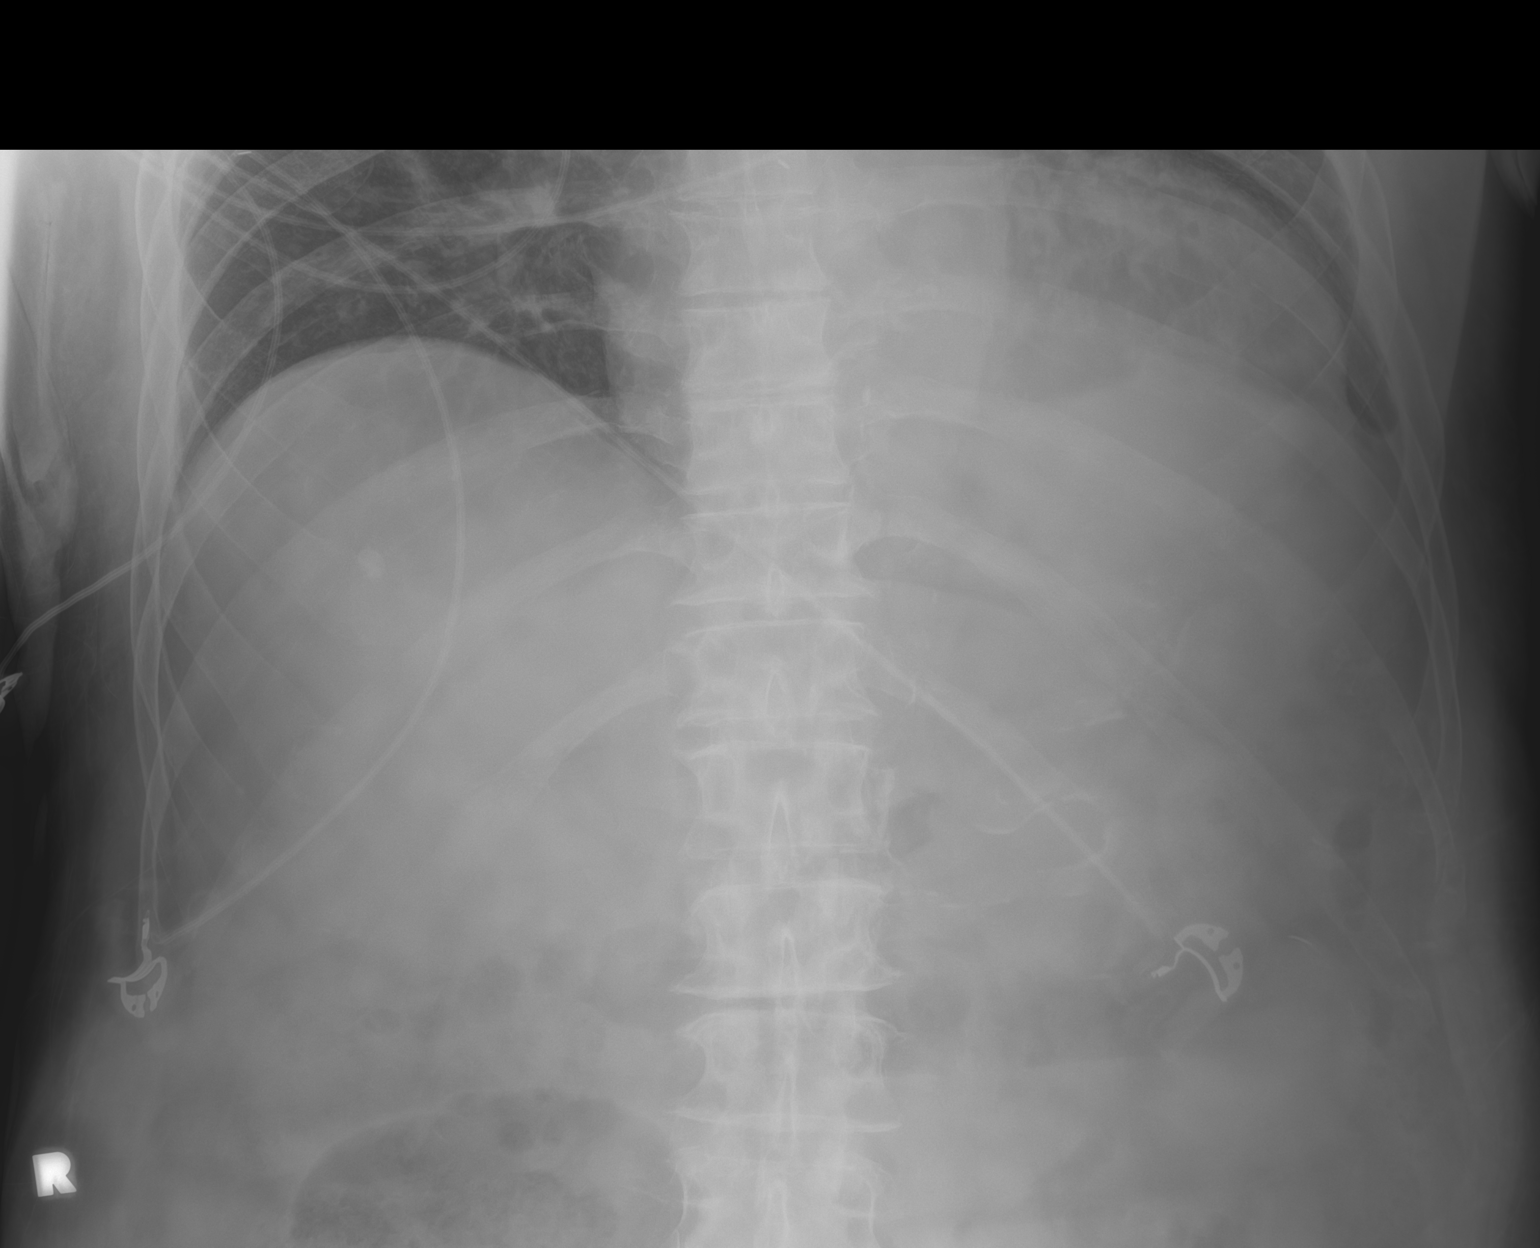

[abdomen supine (2 of 3)]
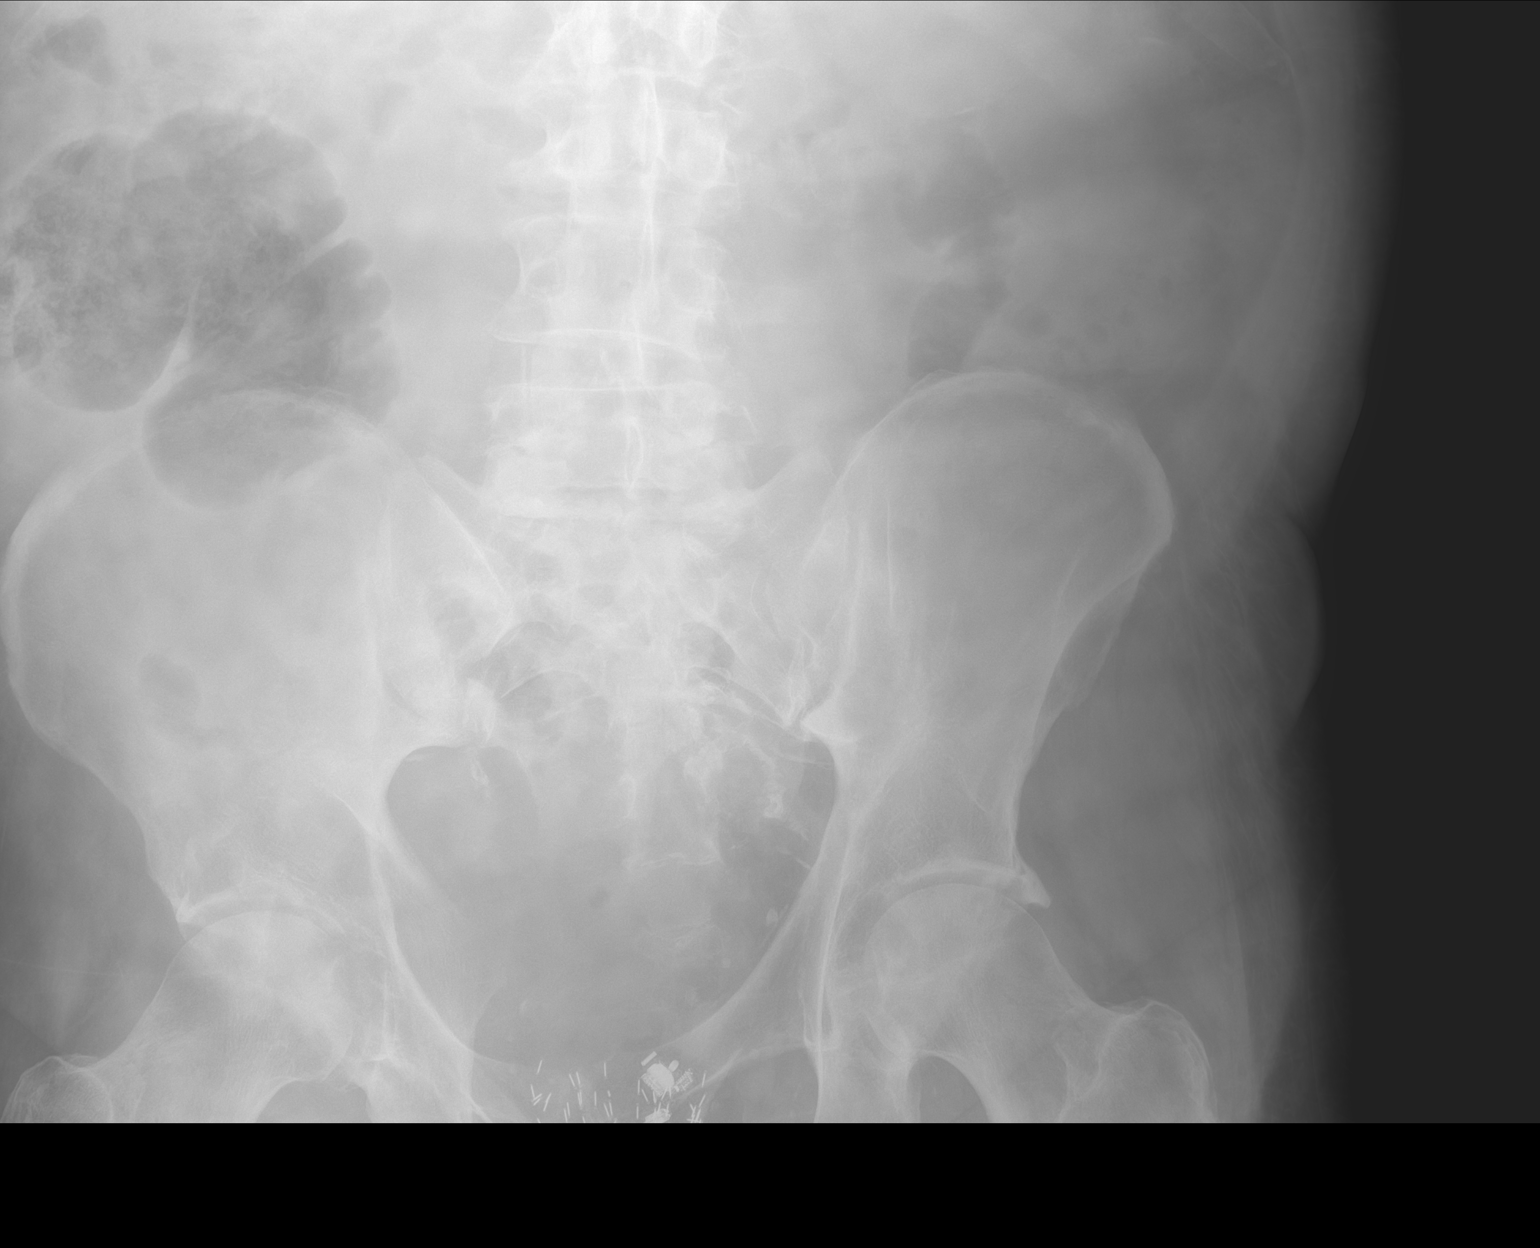

[abdomen supine (3 of 3)]
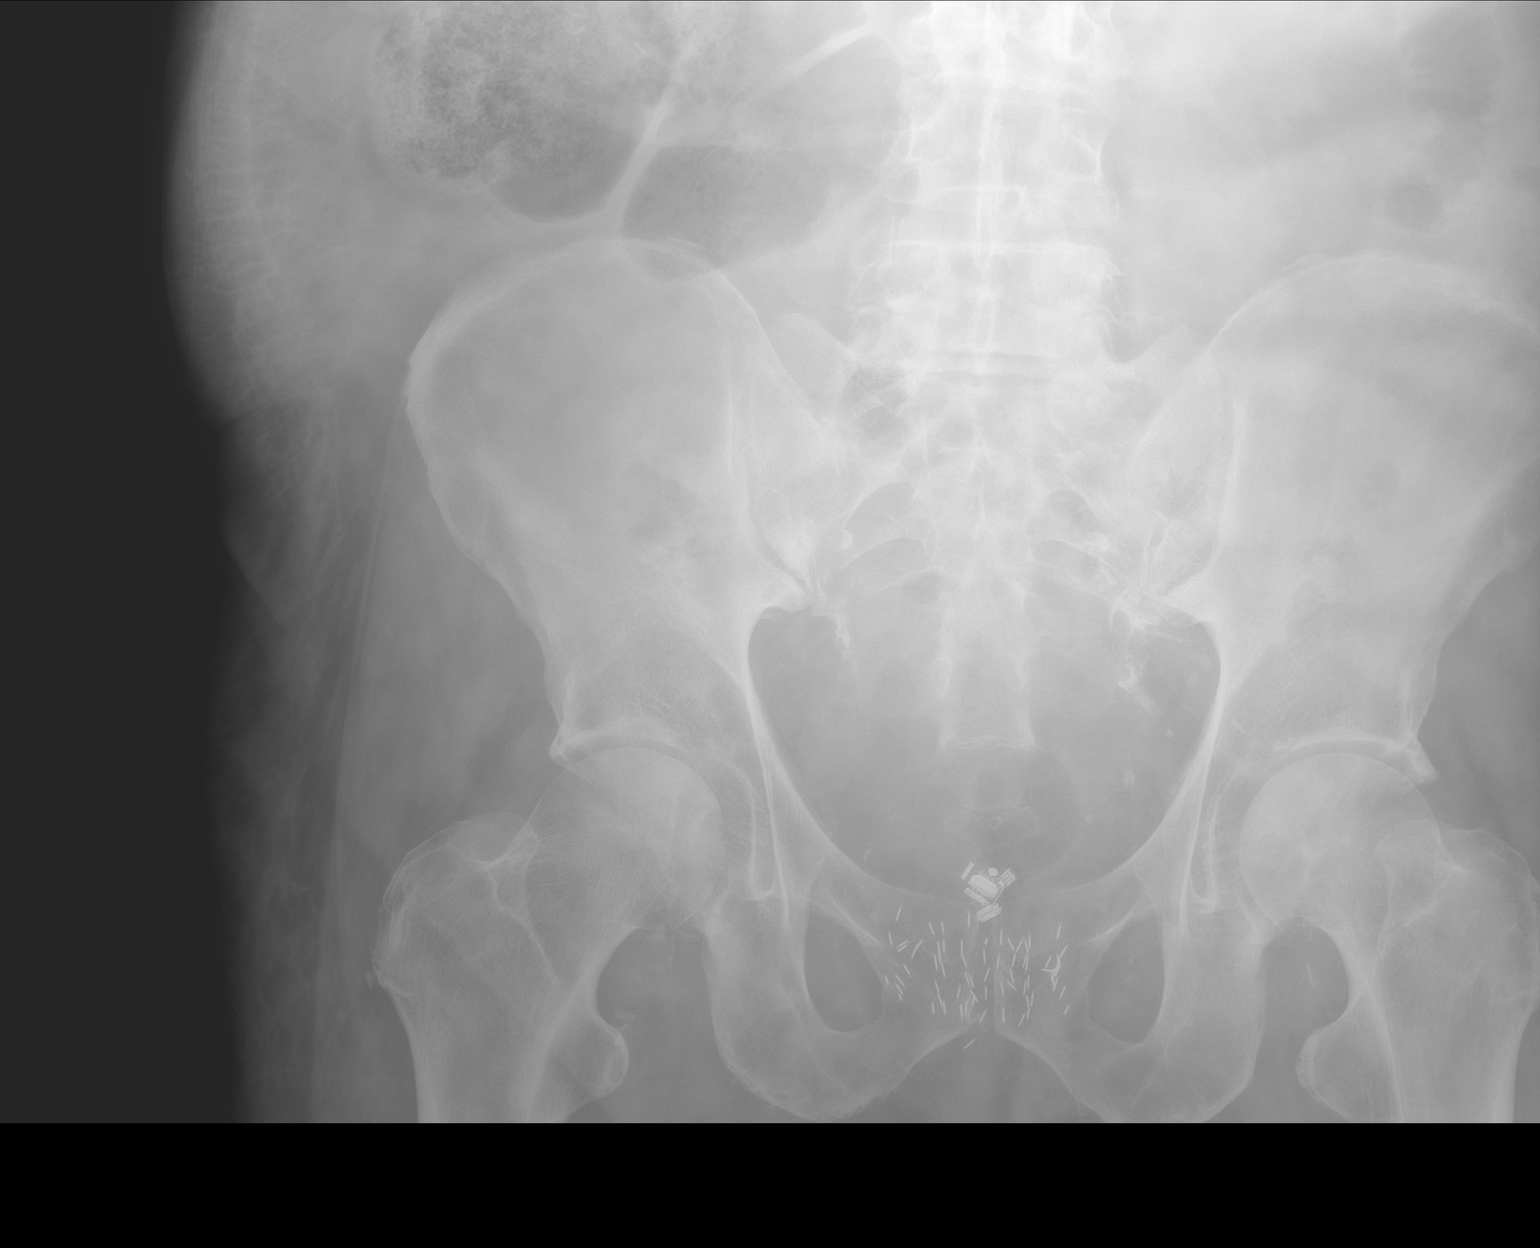

[3 of 3 positions shown; findings below may reference images not displayed]

FINDINGS: Blunting at the left costophrenic angle. Few densities at the left
lung base. The metallic foreign body has migrated into the pelvis
and located in the region of the rectum. Nonobstructive bowel gas
pattern. Calcification overlying the right upper quadrant of the
abdomen.
IMPRESSION: Metallic foreign body has migrated into the pelvis and likely within
the rectum.

Nonobstructive bowel gas pattern.

Mild blunting at the left costophrenic angle. Findings could be
related to a small effusion and atelectasis.

## 2020-12-27 MED ORDER — TRAZODONE HCL 50 MG PO TABS
25.0000 mg | ORAL_TABLET | Freq: Every evening | ORAL | Status: DC | PRN
  Administered 2020-12-27 – 2020-12-28 (×3): 25 mg via ORAL
  Filled 2020-12-27 (×4): qty 1

## 2020-12-27 MED ORDER — FUROSEMIDE 40 MG PO TABS
40.0000 mg | ORAL_TABLET | Freq: Two times a day (BID) | ORAL | Status: DC
  Administered 2020-12-28: 40 mg via ORAL
  Filled 2020-12-27: qty 1

## 2020-12-27 NOTE — Progress Notes (Signed)
PROGRESS NOTE    [name] [name]  AOZ:[id number] DOB: 30-Apr-1933 DOA: 12/20/2020 PCP: Antony Contras, MD   Chief Complaint  Patient presents with  . Fall    Taking Eliquis  Brief Narrative: 87 old male with history of hypertension, A. fib on Eliquis, CAD history of PCI in 9629, chronic systolic CHF EF [id number], GERD, chronic kidney disease stage III brought to the ED after fall at home.  He has had recent falls and while at the shower on 4/5 he fell.  Patient reported that he lost balance. Per report denies any loss of consciousness or trauma to his head. He has been ambulating with use of a walker.  Apparently patient has had severe back pain and sciatica for which he has been seen by physical therapy and has a scheduled appointment with orthopedics.  Due to the pain in his back he has not been sleeping well at night  In the ED,he was afebrile,saturating well on room air,CT scan of the head and cervical spine showed no acute abnormality, but did note a partially visualized right-sided pleural effusion.  Labs significant for for hemoglobin 8.7,platelets 140, sodium 122,chloride 92,CO2 20,BUN 80, creatinine 4.45, calcium 10.8, AST 63, serum osmolarity 287, and TSH 2.577.  Urinalysis was positive for 30 mg/dL protein, but showed no significant signs of infection.  CT scan of the chest for bilateral nondisplaced rib fractures, a metallic foreign body in the gastric body measuring 2.1 cm possibly hearing aid, possible duodenitis/peptic ulcer disease, cardiomegaly with signs of fluid overload with pulmonary edema, and a 4.2 cm ascending aortic aneurysm.Patient was given normal saline IVFs at 75 ml/hr, Patient was admitted and nephrology, gastroenterology and cardiology were consulted. Patient became lethargic after getting morphine in the ED. Patient was followed by GI nephrology cardiology. Underwent EGD that showed stomach ulcer no foreign body-GI advised to hold off anticoagulation until  4/13. xray showed foreign body that has moved below his stoma placed on laxatives and foreign body is further moving currentlly  in rectum 4/12   Subjective: Alert awake oriented.  Resting comfortably.  Has been voiding well despite not taking Lasix yesterday. Overnight no fever blood pressure stable doing well on room air.  CBC stable leukocytosis much better, BUN/Creat 8/4.1 Daughter is at the bedside.  Assessment & Plan:  Multiple falls at home Chronic low back-scheduled to see [facility] 4/18 as outpatient. He has had at least 4  Falls at home  likely multifactorial in setting of hyponatremia and fluid overload chronic low back pain and physical deconditioning.  Patient still remains deconditioned despite his fluid status being optimized.  Continue on PT OT supportive measures.  Continue Tylenol, lidocaine patch and as well as over-the-counter gels/lotion  for pain control, pt/family is reluctant to try any kind of narcotics/tramadol. Can consider MRI of the lumbar spine if foreign body comes out in the stool.  Family requesting to have MRI done while he is here. He does have outpatient follow-up on 4/18.  Vasovagal episode/syncope while having BM 4/11 am: Nonfocal on exam, brief episode.  No recurrence.  Likely from soft blood pressure and his extensive diuresis and negative net balance. continue current Coreg dose, diuretics held yesterday and will hold 1 more day today and will resume on 40 twice daily tomorrow. Off amlodipine.    HTN: BP controlled.Continue Coreg 6.25 mg twice daily and Lasix. Coreg dose was decreased from 25 mg to 6.25 mg previously 2/2 soft bp to allow room for lasix.  Foreign body-hearing aid in  GI tract: Unable to be retrieved with EGD.  X-ray showed foreign body likely in transverse colon> descending colon-on psylium and MiraLAX Bid as per G- repeat x-ray ordered this am.  Monitor with serial abdominal and radiological exam.  Duodenitis/PUD/nonbleeding  gastric ulcer in  EGD 4/7.GI advised to hold anticoagulation/Eliquis until 12/28/20- Continue PPI twice daily.  H. pylori serology pending  Multiple fractures of ribs,bilateral,due to fall: Continue lidocaine, Tylenol.  Patient and family reluctant with any kind of opiates as he is even sensitive to Tylenol.   Acute encephalopathy following IV morphine in the ED. resolved.  Avoid narcotics.  Hyponatremia: Suspect hypervolemic hyponatremia.  Resolved with diuretics and fluid restriction 1.2 L.  Appreciate nephrology input. Recent Labs  Lab 12/23/20 0329 12/24/20 0338 12/25/20 0253 12/26/20 0326 12/27/20 0611  NA 125* 128* 130* 135 136   AKI on CKD 3b-PCP records shows baseline creatinine 4/21 was 3.8. No value since then, possibly could have progressed since and holding steady in 4.3 range- CKD stage 5, likely new baseline.his CT shows bilateral complex renal cyst. Nephrology on board and appreciate input-discussed with Dr. Augustin Coupe this morning continue to hold Lasix 1 more day and resume 40 twice daily from tomorrow since patient has had good urine output. BUN/cr stable in 80s/4.2s-slightly better today.Has had good urine output and -11 L balance. Recent Labs  Lab 12/23/20 0329 12/24/20 0338 12/25/20 0253 12/26/20 0326 12/27/20 0611  BUN 80* 86* 94* 87* 82*  CREATININE 4.35* 4.54* 4.65* 4.30* 4.11*  Net IO Since Admission: -11,402 mL [12/27/20 0815]  Intake/Output Summary (Last 24 hours) at 12/27/2020 0815 Last data filed at 12/27/2020 0438 Gross per 24 hour  Intake 423 ml  Output 1500 ml  Net -1077 ml   Iron deficiency anemia:guaiac was noted to be negative.  Hemoglobin appears stable at 8- 9 g.   Recent Labs  Lab 12/23/20 0504 12/24/20 0338 12/25/20 0253 12/26/20 0326 12/27/20 0610  HGB 8.6* 9.0* 8.7* 8.4* 8.9*  HCT 25.0* 25.9* 25.3* 24.6* 26.3*   Leukocytosis, unclear etiology could be reactive.  It is nicely resolved.Patient denied urinary symptoms UA fairly stable.   Recent Labs  Lab 12/23/20 0504 12/24/20 0338 12/25/20 0253 12/26/20 0326 12/27/20 0610  WBC 9.1 10.7* 16.6* 10.3 11.4*   CAD S/P angioplasty 2013:trop neg 14>13.continue his aspirin, beta-blocker and statin.  Acute on chronic CHF exacerbation w/ preserved EF:BNP elevated and was hypervolemic on exam. Echo shows normal LV function suspect CHF with preserved EF. Net neg 11 liters,  Cont LASIX at 40 mg twice daily 4/13 .Appreciate cardiology nephrology input Net IO Since Admission: -11,402 mL [12/27/20 0815]  Filed Weights   12/20/20 2325 12/24/20 0357 12/25/20 2026  Weight: 115 kg 95.4 kg 97.6 kg   Permanent A. fib on Eliquis now with multiple falls rate controlled on Coreg. DOAC is held for EGD and  Now 2/2 stmomach ulcer- GI advised to hold for 5 days.Plan to resume as soon as okay with GI.Patient is here for temporarily from [place] and is followed by cardiologist in [place].  Thrombocytopenia: resolved  Recent Labs  Lab 12/23/20 0504 12/24/20 0338 12/25/20 0253 12/26/20 0326 12/27/20 0610  PLT 135* 157 160 153 159   Hypercalcemia: Calcium level is stable 9.9.  Hyperlipidemia continue statin.   MorBid Obesity BMI 34: Patient will benefit with weight loss PCP follow-up.  Diet Order            DIET SOFT Room service appropriate? Yes; Fluid  consistency: Thin; Fluid restriction: 1200 mL Fluid  Diet effective now               Patient's Body mass index is 29.18 kg/m.  DVT prophylaxis: SCD.  Anticoagulation on hold due to gastric ulcer plan to resume on 4/13 Code Status:   Code Status: Full Code  Family Communication: plan of care discussed with patient and his son at bedside previously. Discussed with patient's daughter at the bedside 4/9  and with another daughter 4/10. Updated daughter at the bedside this morning.  Status is: Inpatient Remains inpatient appropriate because:IV treatments appropriate due to intensity of illness or inability to take PO and Inpatient  level of care appropriate due to severity of illness  Dispo: The patient is from: Home              Anticipated d/c is to: SNF versus home health.  Family undecided, social worker already searching for bed.  Will be high risk for readmission if goes home.              Patient currently is not medically stable to d/c.   Difficult to place patient No Unresulted Labs (From admission, onward)          Start     Ordered   12/26/20 1005  H. pylori antibody, IgG  Once,   R        12/26/20 1004   12/26/20 0500  CBC  Daily,   R      12/25/20 0846        Medications reviewed:  Scheduled Meds: . allopurinol  100 mg Oral Daily  . aspirin EC  81 mg Oral Daily  . carvedilol  6.25 mg Oral BID WC  . furosemide  80 mg Oral BID  . latanoprost  1 drop Both Eyes QHS  . lidocaine  1 patch Transdermal Q24H  . lidocaine  1 patch Transdermal Q24H  . magnesium citrate  1 Bottle Oral Once  . pantoprazole  40 mg Oral BID AC  . polyethylene glycol  17 g Oral BID  . psyllium  1 packet Oral TID  . rosuvastatin  5 mg Oral Q M,W,F  . sodium chloride flush  3 mL Intravenous Q12H   Continuous Infusions: . sodium chloride      Consultants: Nephrology, cardiology GI   Procedures:see note EGD Medium-sized hiatal hernia. - Non-bleeding gastric ulcer with no stigmata of bleeding. - Gastritis. - Duodenitis. - No foreign body seen to the extent of our examination (D2). Impression: None Moderate Sedation: - Return patient to hospital ward for ongoing care. - Soft diet today. - Continue present medications. - Based on finding of ulcer, would hold Apixaban for another 5 days, if at all possible. - Pantoprazole 40 mg po bid x 6 weeks, then 40 mg po qd thereafter indefinitely. - Perform an H. pylori serology tomorrow. - Miralax 17 grams po bid, to facilitate passage of foreign body. - Serial (daily) abdominal xrays. - [facility] will follow  Antimicrobials: Anti-infectives (From admission, onward)    None     Culture/Microbiology No results found for: SDES, SPECREQUEST, CULT, REPTSTATUS  Other culture-see note  Objective: Vitals: Today's Vitals   12/26/20 1712 12/26/20 2108 12/26/20 2251 12/27/20 0609  BP: 122/67 121/71  (!) 142/84  Pulse: (!) 53 82  67  Resp: 18 17  18   Temp: 97.7 F (36.5 C) 98 F (36.7 C)  98.8 F (37.1 C)  TempSrc: Oral Oral  Oral  SpO2: 99% 98%  99%  Weight:      Height:      PainSc:   3      Intake/Output Summary (Last 24 hours) at 12/27/2020 0815 Last data filed at 12/27/2020 0438 Gross per 24 hour  Intake 423 ml  Output 1500 ml  Net -1077 ml   Filed Weights   12/20/20 2325 12/24/20 0357 12/25/20 2026  Weight: 115 kg 95.4 kg 97.6 kg   Weight change:   Intake/Output from previous day: 04/11 0701 - 04/12 0700 In: 423 [P.O.:420; I.V.:3] Out: 1500 [Urine:1500] Intake/Output this shift: No intake/output data recorded. Filed Weights   12/20/20 2325 12/24/20 0357 12/25/20 2026  Weight: 115 kg 95.4 kg 97.6 kg    Examination: General exam:AAOx3,hard of hearing, NAD, weak appearing. HEENT:Oral mucosa moist, Ear/Nose WNL grossly, dentition normal. Respiratory system:Bilaterally clear,no wheezing or crackles,no use of accessory muscle Cardiovascular system:S1 & S2 +,No JVD. Gastrointestinal system: Abdomen soft, NT,ND, BS+. Nervous System:Alert, awake, moving extremities and grossly nonfocal. Extremities: No edema, distal peripheral pulses palpable. Tender shoulder.arms just w/ slight touch  Skin: No rashes,no icterus. MSK: Normal muscle bulk,tone, power.   Data Reviewed: I have personally reviewed following labs and imaging studies CBC: Recent Labs  Lab 12/21/20 0007 12/22/20 0754 12/23/20 0504 12/24/20 0338 12/25/20 0253 12/26/20 0326 12/27/20 0610  WBC 10.3   < > 9.1 10.7* 16.6* 10.3 11.4*  NEUTROABS 8.3*  --   --   --   --   --   --   HGB 8.7*   < > 8.6* 9.0* 8.7* 8.4* 8.9*  HCT 25.5*   < > 25.0* 25.9* 25.3* 24.6* 26.3*  MCV  91.1   < > 88.7 89.0 91.3 91.1 92.0  PLT 140*   < > 135* 157 160 153 159   < > = values in this interval not displayed.   Basic Metabolic Panel: Recent Labs  Lab 12/21/20 1921 12/22/20 0754 12/23/20 0329 12/24/20 0338 12/25/20 0253 12/26/20 0326 12/27/20 0611  NA 123*   < > 125* 128* 130* 135 136  K 4.3   < > 3.7 3.7 3.6 3.2* 3.5  CL 91*   < > 92* 93* 95* 97* 97*  CO2 23   < > 22 22 25 29 30   GLUCOSE 110*   < > 98 167* 149* 118* 107*  BUN 77*   < > 80* 86* 94* 87* 82*  CREATININE 4.43*   < > 4.35* 4.54* 4.65* 4.30* 4.11*  CALCIUM 9.9   < > 9.5 9.7 9.7 9.7 9.5  PHOS 5.9*  --   --   --   --   --   --    < > = values in this interval not displayed.   GFR: Estimated Creatinine Clearance: 15.9 mL/min (A) (by C-G formula based on SCr of 4.11 mg/dL (H)). Liver Function Tests: Recent Labs  Lab 12/21/20 0007 12/21/20 1921  AST 63*  --   ALT 41  --   ALKPHOS 84  --   BILITOT 1.3*  --   PROT 5.8*  --   ALBUMIN 3.2* 3.0*   Recent Labs  Lab 12/21/20 0007  LIPASE 41   No results for input(s): AMMONIA in the last 168 hours. Coagulation Profile: No results for input(s): INR, PROTIME in the last 168 hours. Cardiac Enzymes: Recent Labs  Lab 12/21/20 0848  CKTOTAL 480*   BNP (last 3 results) No results for input(s): PROBNP in the last 8760 hours. HbA1C:  No results for input(s): HGBA1C in the last 72 hours. CBG: Recent Labs  Lab 12/26/20 0813  GLUCAP 116*   Lipid Profile: No results for input(s): CHOL, HDL, LDLCALC, TRIG, CHOLHDL, LDLDIRECT in the last 72 hours. Thyroid Function Tests: No results for input(s): TSH, T4TOTAL, FREET4, T3FREE, THYROIDAB in the last 72 hours. Anemia Panel: No results for input(s): VITAMINB12, FOLATE, FERRITIN, TIBC, IRON, RETICCTPCT in the last 72 hours. Sepsis Labs: No results for input(s): PROCALCITON, LATICACIDVEN in the last 168 hours.  Recent Results (from the past 240 hour(s))  Resp Panel by RT-PCR (Flu A&B, Covid) Nasopharyngeal  Swab     Status: None   Collection Time: 12/21/20  2:45 AM   Specimen: Nasopharyngeal Swab; Nasopharyngeal(NP) swabs in vial transport medium  Result Value Ref Range Status   SARS Coronavirus 2 by RT PCR NEGATIVE NEGATIVE Final    Comment: (NOTE) SARS-CoV-2 target nucleic acids are NOT DETECTED.  The SARS-CoV-2 RNA is generally detectable in upper respiratory specimens during the acute phase of infection. The lowest concentration of SARS-CoV-2 viral copies this assay can detect is 138 copies/mL. A negative result does not preclude SARS-Cov-2 infection and should not be used as the sole basis for treatment or other patient management decisions. A negative result may occur with  improper specimen collection/handling, submission of specimen other than nasopharyngeal swab, presence of viral mutation(s) within the areas targeted by this assay, and inadequate number of viral copies(<138 copies/mL). A negative result must be combined with clinical observations, patient history, and epidemiological information. The expected result is Negative.  Fact Sheet for Patients:  EntrepreneurPulse.com.au  Fact Sheet for Healthcare Providers:  IncredibleEmployment.be  This test is no t yet approved or cleared by the [place] FDA and  has been authorized for detection and/or diagnosis of SARS-CoV-2 by FDA under an Emergency Use Authorization (EUA). This EUA will remain  in effect (meaning this test can be used) for the duration of the COVID-19 declaration under Section 564(b)(1) of the Act, 21 U.S.C.section 360bbb-3(b)(1), unless the authorization is terminated  or revoked sooner.       Influenza A by PCR NEGATIVE NEGATIVE Final   Influenza B by PCR NEGATIVE NEGATIVE Final    Comment: (NOTE) The Xpert Xpress SARS-CoV-2/FLU/RSV plus assay is intended as an aid in the diagnosis of influenza from Nasopharyngeal swab specimens and should not be used as a sole  basis for treatment. Nasal washings and aspirates are unacceptable for Xpert Xpress SARS-CoV-2/FLU/RSV testing.  Fact Sheet for Patients: EntrepreneurPulse.com.au  Fact Sheet for Healthcare Providers: IncredibleEmployment.be  This test is not yet approved or cleared by the [place] FDA and has been authorized for detection and/or diagnosis of SARS-CoV-2 by FDA under an Emergency Use Authorization (EUA). This EUA will remain in effect (meaning this test can be used) for the duration of the COVID-19 declaration under Section 564(b)(1) of the Act, 21 U.S.C. section 360bbb-3(b)(1), unless the authorization is terminated or revoked.  Performed at [facility], [place] [address], [place], [place] [address]      Radiology Studies: DG Abd 1 View  Result Date: 12/26/2020 CLINICAL DATA:  Ingested foreign body EXAM: ABDOMEN - 1 VIEW COMPARISON:  12/25/2020 FINDINGS: The clustered metallic densities noted on prior examination now overlies the descending colon within the left mid abdomen. Normal abdominal gas pattern. No gross free intraperitoneal gas. Brachytherapy seeds overlie the prostate gland. Vascular calcifications are seen within the pelvis. No acute bone abnormality. IMPRESSION: Metallic foreign bodies likely within  the descending colon. Electronically Signed   By: Fidela Salisbury MD   On: 12/26/2020 06:02   DG Abd 1 View  Result Date: 12/25/2020 CLINICAL DATA:  Serial daily abdominal x-rays in this 87 year old patient who swallowed his hearing aids 5 days ago, as the hearing aids were not identified on EGD 3 days ago. EXAM: PORTABLE ABDOMEN-1 VIEW COMPARISON:  Abdominal x-rays 12/24/2020 and earlier. CT abdomen and pelvis 12/21/2020 FINDINGS: The foreign body (or foreign bodies) corresponding to the ingested hearing aid(s) projects over the central abdomen just to the LEFT of midline, likely in a proximal jejunal loop. Its position is unchanged  since the abdominal x-ray 3 days ago. Bowel gas pattern unremarkable without evidence of obstruction or significant ileus. Moderate to large colonic stool burden as before. IMPRESSION: Ingested hearing aid(s) projects over the central abdomen just to the LEFT of midline, likely in a proximal jejunal loop. Its position is unchanged over the past 3 days. Electronically Signed   By: Evangeline Dakin M.D.   On: 12/25/2020 13:35     LOS: 6 days   Antonieta Pert, MD [facility]  12/27/2020, 8:15 AM

## 2020-12-27 NOTE — Progress Notes (Signed)
Per abdominal x-ray today, metallic foreign body has migrated into the pelvis and likely within the rectum.  Suspect patient will pass hearing aid today or tomorrow.  If H. Pylori serologies are positive, recommend treatment.  [facility] will sign off. Please contact us if we can be of any further assistance during this hospital stay.

## 2020-12-28 ENCOUNTER — Inpatient Hospital Stay (HOSPITAL_COMMUNITY): Payer: Medicare Other

## 2020-12-28 DIAGNOSIS — S2243XA Multiple fractures of ribs, bilateral, initial encounter for closed fracture: Secondary | ICD-10-CM | POA: Diagnosis not present

## 2020-12-28 LAB — BASIC METABOLIC PANEL WITH GFR
Anion gap: 8 (ref 5–15)
BUN: 74 mg/dL — ABNORMAL HIGH (ref 8–23)
CO2: 31 mmol/L (ref 22–32)
Calcium: 9.7 mg/dL (ref 8.9–10.3)
Chloride: 98 mmol/L (ref 98–111)
Creatinine, Ser: 4 mg/dL — ABNORMAL HIGH (ref 0.61–1.24)
GFR, Estimated: 14 mL/min — ABNORMAL LOW
Glucose, Bld: 104 mg/dL — ABNORMAL HIGH (ref 70–99)
Potassium: 3.8 mmol/L (ref 3.5–5.1)
Sodium: 137 mmol/L (ref 135–145)

## 2020-12-28 LAB — CBC
HCT: 27.9 % — ABNORMAL LOW (ref 39.0–52.0)
Hemoglobin: 9.2 g/dL — ABNORMAL LOW (ref 13.0–17.0)
MCH: 31.1 pg (ref 26.0–34.0)
MCHC: 33 g/dL (ref 30.0–36.0)
MCV: 94.3 fL (ref 80.0–100.0)
Platelets: 157 K/uL (ref 150–400)
RBC: 2.96 MIL/uL — ABNORMAL LOW (ref 4.22–5.81)
RDW: 16.3 % — ABNORMAL HIGH (ref 11.5–15.5)
WBC: 11.8 K/uL — ABNORMAL HIGH (ref 4.0–10.5)
nRBC: 0 % (ref 0.0–0.2)

## 2020-12-28 IMAGING — DX DG ABDOMEN 1V
3 series · 3 of 3 positions shown · non-contrast
Comparison: Multiple films dating back to [DATE]

CLINICAL DATA: Follow-up metallic foreign body

EXAM:
ABDOMEN - 1 VIEW

[abdomen kub (1 of 3)]
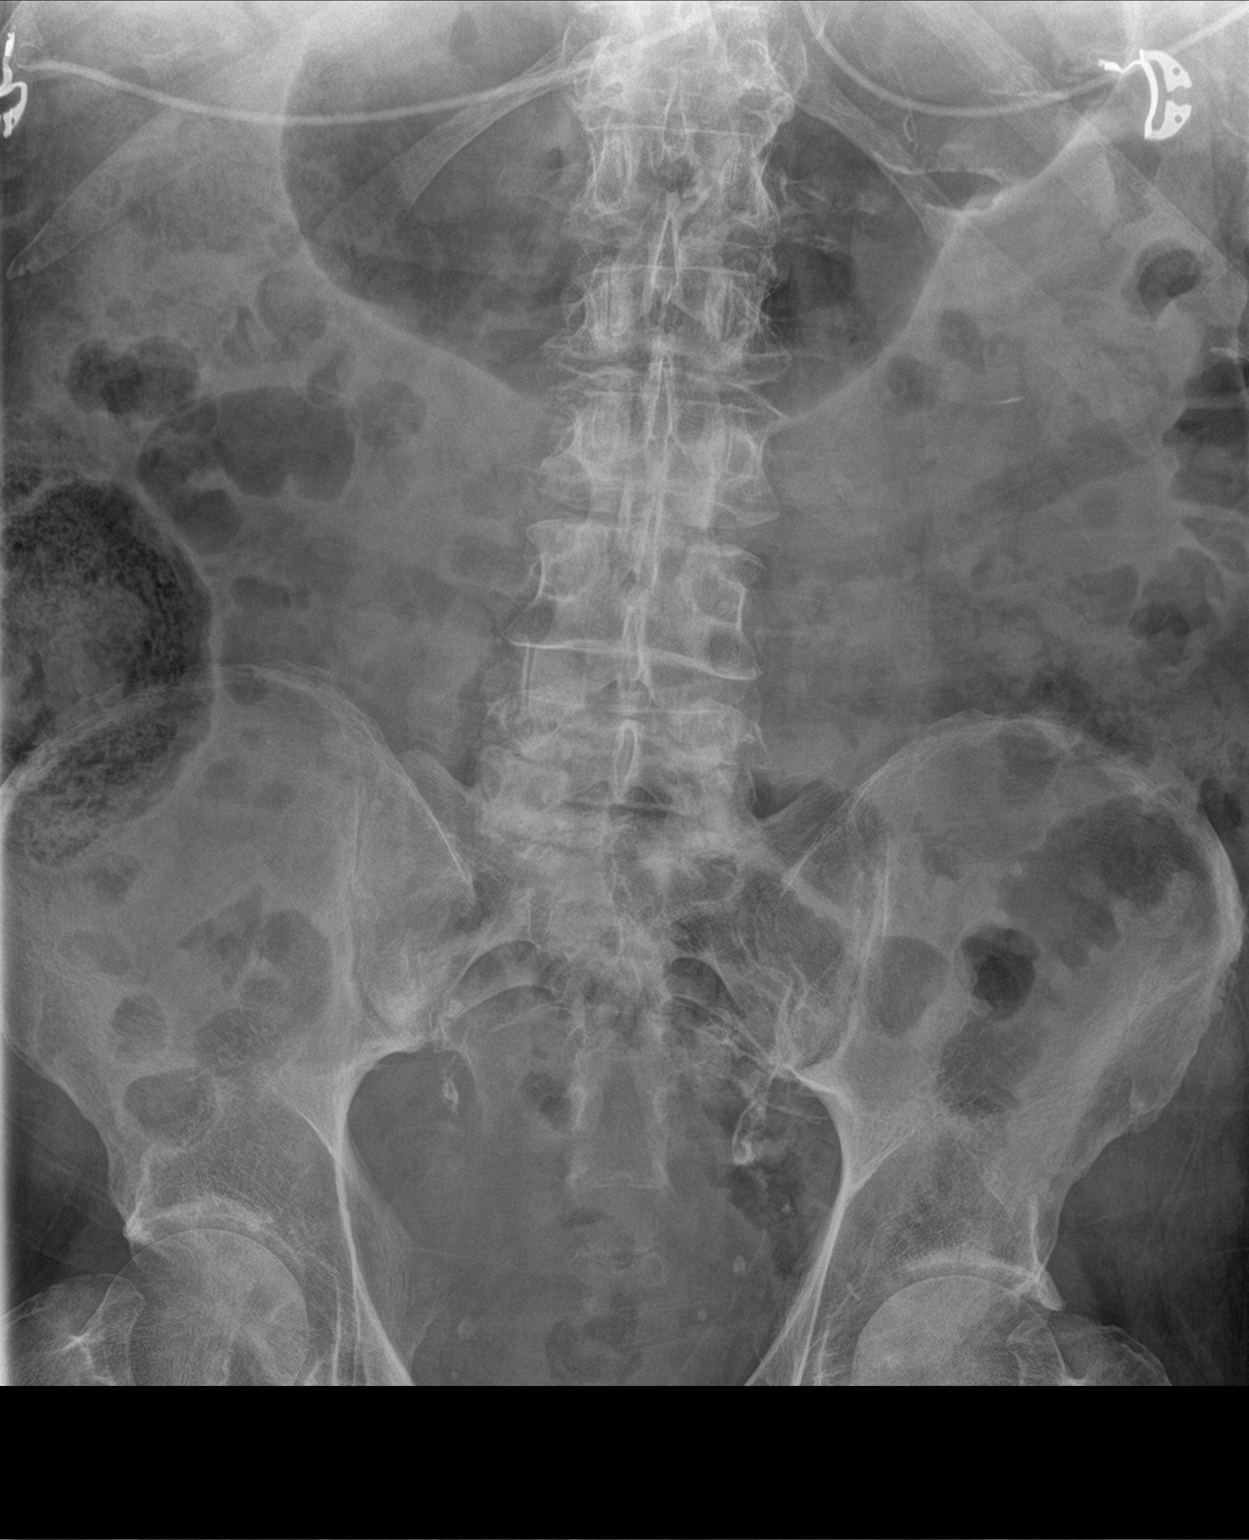

[abdomen kub (2 of 3)]
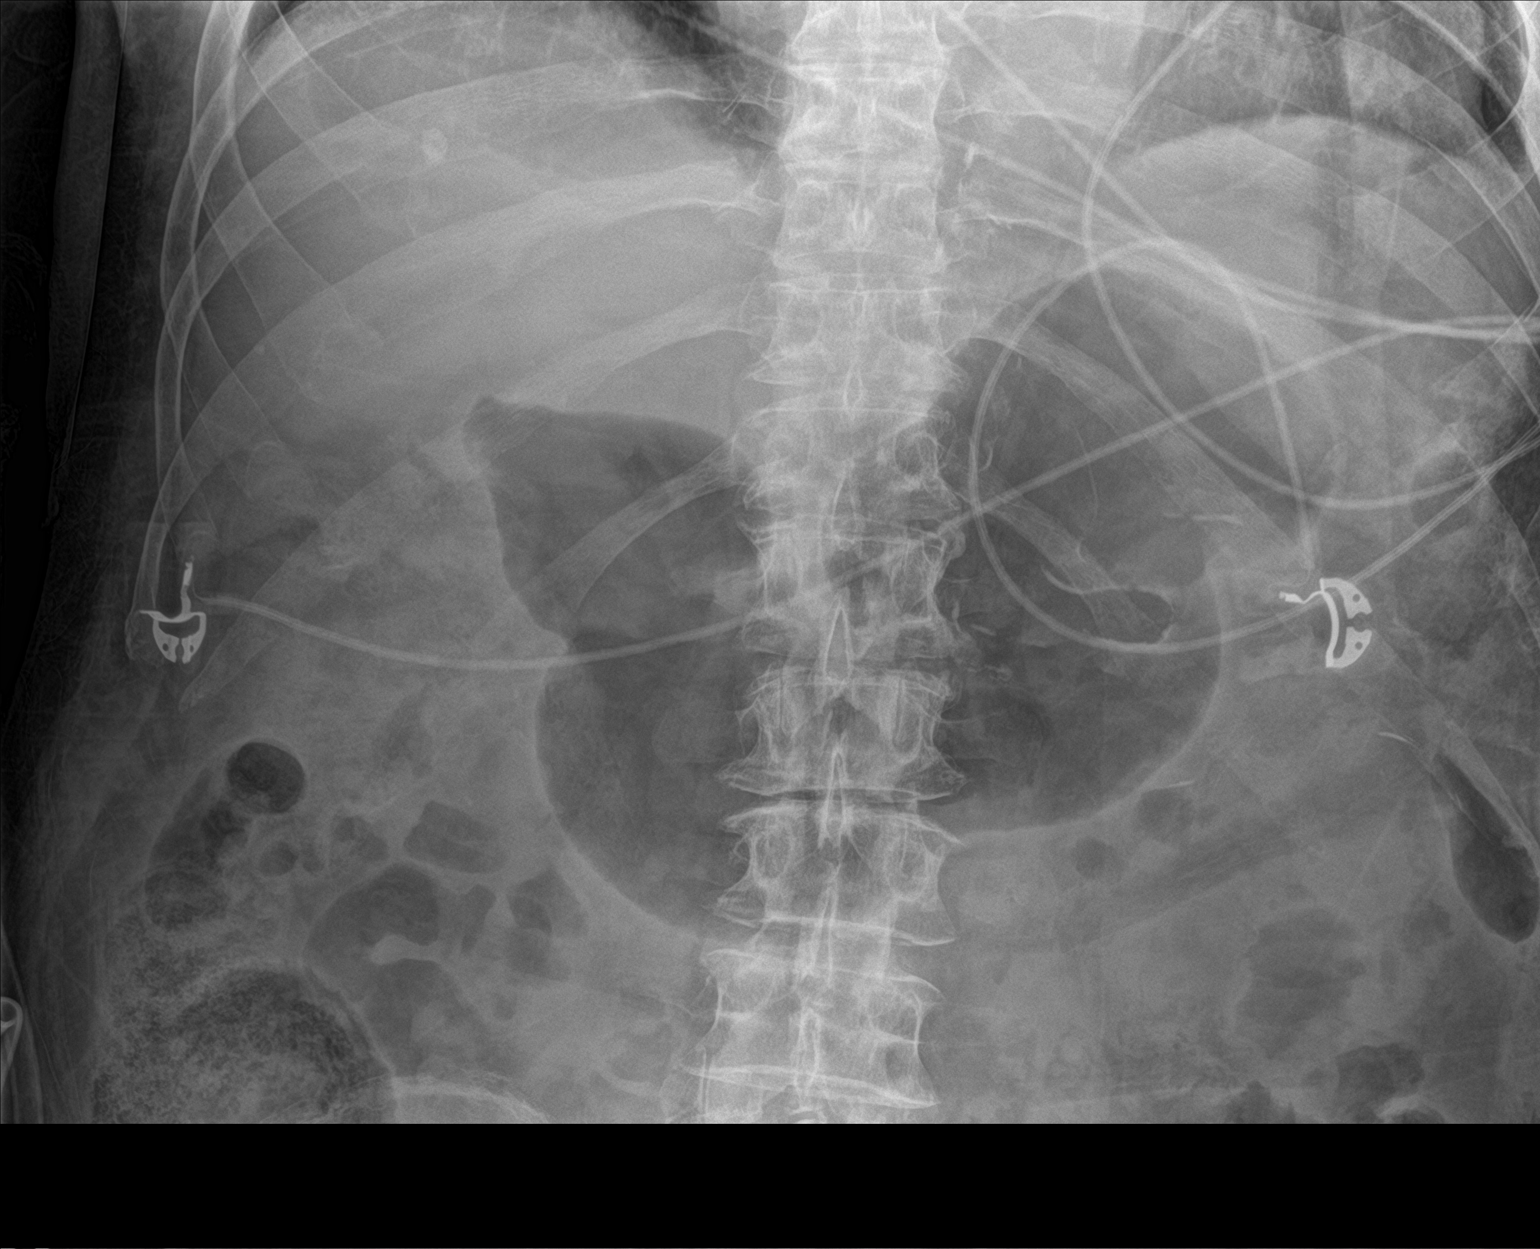

[abdomen kub (3 of 3)]
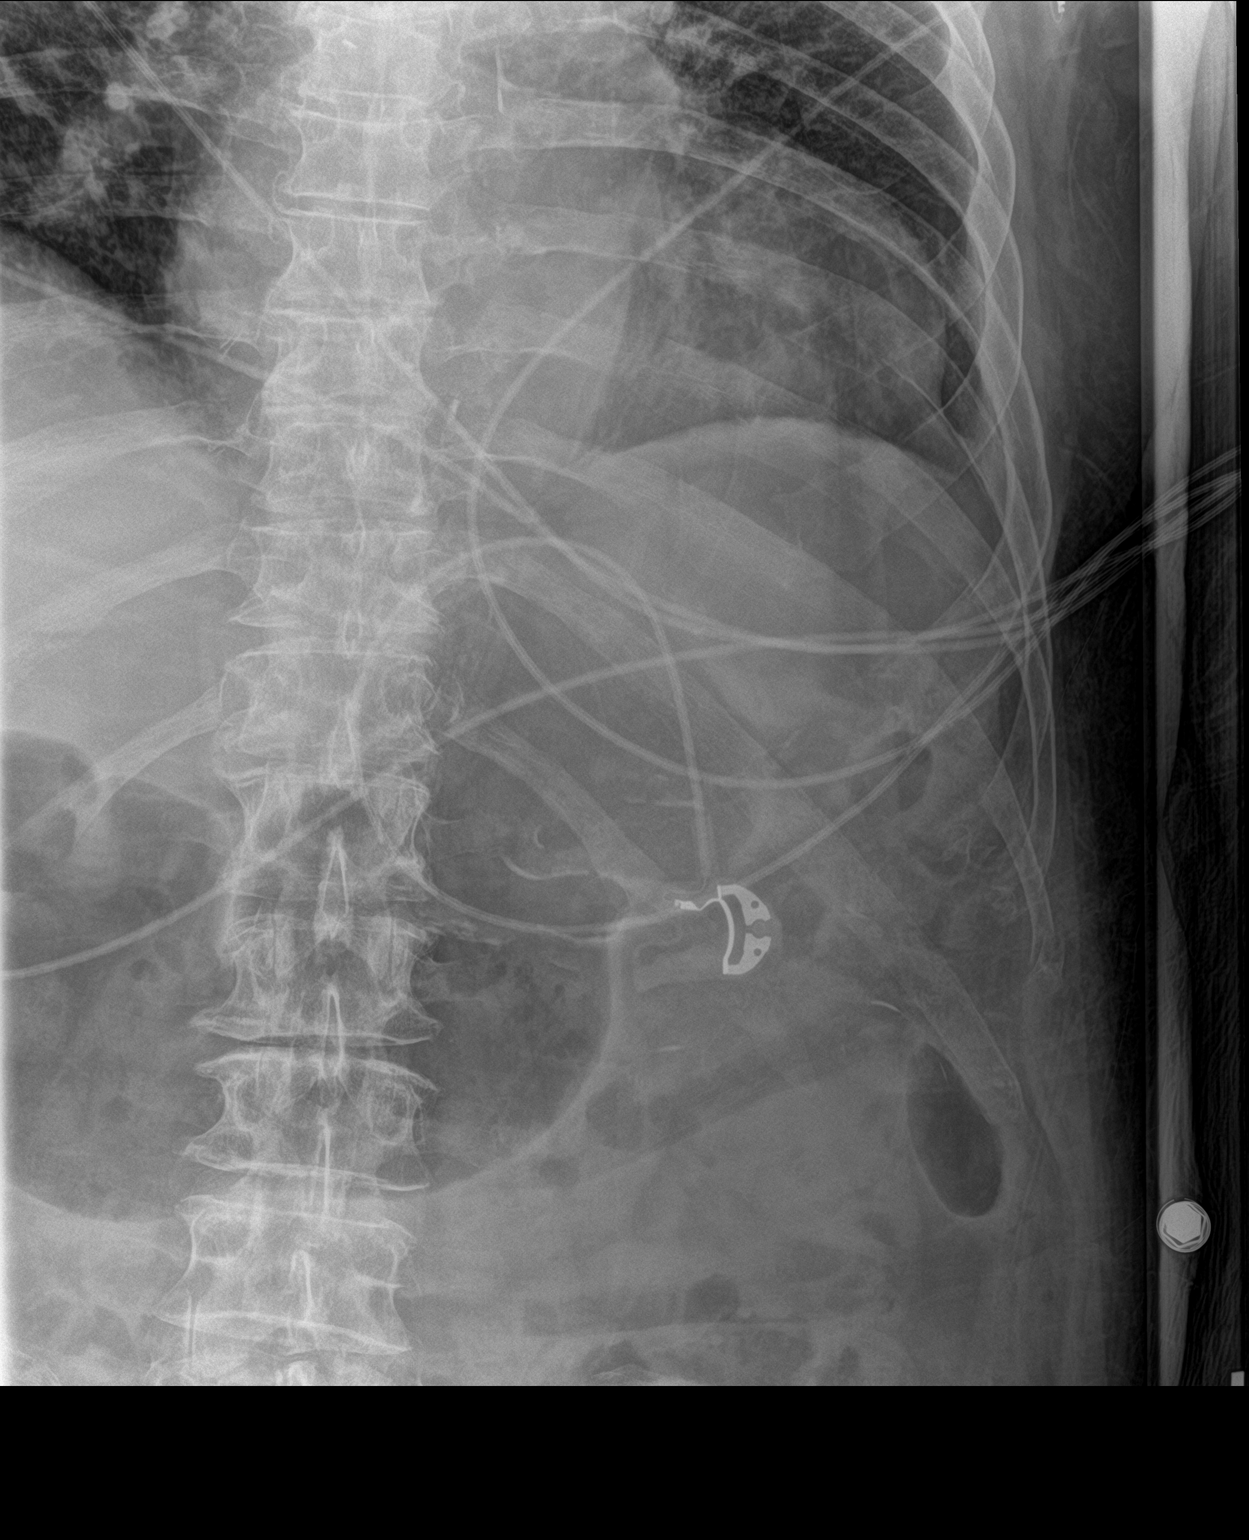

[3 of 3 positions shown; findings below may reference images not displayed]

FINDINGS: Scattered large and small bowel gas is noted. Radiopaque foreign
body is noted within the rectum similar to that seen on the prior
exam. Multiple prostate brachytherapy seeds are noted. Degenerative
changes of lumbar spine are seen.
IMPRESSION: Foreign body within the rectum stable in appearance from the
previous day.

## 2020-12-28 MED ORDER — SODIUM CHLORIDE 0.9 % IV BOLUS: 500.0000 mL | Freq: Once | INTRAVENOUS | Status: DC

## 2020-12-28 MED ORDER — FUROSEMIDE 40 MG PO TABS
40.0000 mg | ORAL_TABLET | Freq: Every day | ORAL | Status: DC
  Administered 2020-12-29 – 2020-12-30 (×2): 40 mg via ORAL
  Filled 2020-12-28 (×2): qty 1

## 2020-12-28 MED ORDER — SODIUM CHLORIDE 0.9 % IV BOLUS
500.0000 mL | Freq: Once | INTRAVENOUS | Status: AC
  Administered 2020-12-28: 500 mL via INTRAVENOUS

## 2020-12-28 MED ORDER — MELATONIN 3 MG PO TABS
3.0000 mg | ORAL_TABLET | Freq: Every day | ORAL | Status: DC
  Administered 2020-12-28 – 2021-01-04 (×8): 3 mg via ORAL
  Filled 2020-12-28 (×8): qty 1

## 2020-12-28 MED ORDER — CARVEDILOL 3.125 MG PO TABS
3.1250 mg | ORAL_TABLET | Freq: Two times a day (BID) | ORAL | Status: DC
  Administered 2020-12-28 – 2021-01-02 (×10): 3.125 mg via ORAL
  Filled 2020-12-28 (×10): qty 1

## 2020-12-28 MED ORDER — APIXABAN 2.5 MG PO TABS
2.5000 mg | ORAL_TABLET | Freq: Two times a day (BID) | ORAL | Status: DC
  Administered 2020-12-28 – 2021-01-04 (×15): 2.5 mg via ORAL
  Filled 2020-12-28 (×15): qty 1

## 2020-12-28 NOTE — Progress Notes (Signed)
OT Cancellation Note  Patient Details Name: Mannix Kroeker MRN: [id number] DOB: 12-28-1932   Cancelled Treatment:    Reason Eval/Treat Not Completed: Medical issues which prohibited therapy (Pt with orthostatic hypotension during PT.) Will continue to follow.  Malka So 12/28/2020, 1:23 PM  Nestor Lewandowsky, OTR/L Acute Rehabilitation Services Pager: [phone] Office: [phone]

## 2020-12-28 NOTE — Progress Notes (Signed)
PT Cancellation Note  Patient Details Name: [name] [name] MRN: [id number] DOB: April 13, 1933   Cancelled Treatment:    Reason Eval/Treat Not Completed: Patient at procedure or test/unavailable   Will follow up later today as time allows;  Otherwise, will follow up for PT tomorrow;   Thank you,  Roney Marion, PT  Acute Rehabilitation Services Pager [phone] Office [phone]     [name] 12/28/2020, 11:36 AM

## 2020-12-28 NOTE — Progress Notes (Signed)
Hearing aid company sending a representative to do mold of ear Thursday or Friday.  This would not count as one of the visitors.

## 2020-12-28 NOTE — Progress Notes (Signed)
PROGRESS NOTE    [name] [name]  ERD:[id number] DOB: June 06, 1933 DOA: 12/20/2020 PCP: Antony Contras, MD   Chief Complaint  Patient presents with  . Fall    Taking Eliquis  Brief Narrative: 55 old male with history of hypertension, A. fib on Eliquis, CAD history of PCI in 5631, chronic systolic CHF EF [id number]-[id number], GERD, chronic kidney disease stage III brought to the ED after fall at home.  He has had recent falls and while at the shower on 4/5 he fell.  Patient reported that he lost balance. Per report denies any loss of consciousness or trauma to his head. He has been ambulating with use of a walker.  Apparently patient has had severe back pain and sciatica for which he has been seen by physical therapy and has a scheduled appointment with orthopedics.  Due to the pain in his back he has not been sleeping well at night In the ED,he was afebrile,saturating well on room air,CT scan of the head and cervical spine showed no acute abnormality, but did note a partially visualized right-sided pleural effusion.  Labs significant for for hemoglobin 8.7,platelets 140, sodium 122,chloride 92,CO2 20,BUN 80, creatinine 4.45, calcium 10.8, AST 63, serum osmolarity 287, and TSH 2.577.  Urinalysis was positive for 30 mg/dL protein, but showed no significant signs of infection.  CT scan of the chest for bilateral nondisplaced rib fractures, a metallic foreign body in the gastric body measuring 2.1 cm possibly hearing aid, possible duodenitis/peptic ulcer disease, cardiomegaly with signs of fluid overload with pulmonary edema, and a 4.2 cm ascending aortic aneurysm.Patient was given normal saline IVFs at 75 ml/hr, Patient was admitted and nephrology, gastroenterology and cardiology were consulted. Patient became lethargic after getting morphine in the ED. Patient was followed by GI nephrology cardiology. Underwent EGD that showed stomach ulcer no foreign body-GI advised to hold off anticoagulation until 4/13. xray  showed foreign body that has moved below his stoma placed on laxatives and foreign body is further moving currentlly  in rectum 4/12 Had fainting/vasovagal episode while in BM on 4/11 meds were adjusted. Again was orthostatic blood pressure in 70s on standing 4/13  Subjective: Seen this morning was resting comfortably.  PT did work with him later today and was orthostatic blood pressure understanding in 70s. Discussed with family this morning while patient was resting comfortably  Assessment & Plan:  Multiple falls at home Chronic low back-scheduled to see [facility] 4/18 as outpatient. He has had at least 4  Falls at home  likely multifactorial in setting of hyponatremia and fluid overload chronic low back pain and physical deconditioning.  Remains deconditioned weak frail.  Continue PT OT.  Continue lidocaine patch Tylenol, sensitive to narcotics patient and family refusing even the tramadol.  He has a foreign body in his intestine once it is out can consider MRI lumbar spine, has follow-up with orthopedics 4/18.    Vasovagal episode/syncope while having BM 4/11 am again orthostatic hypotension 4/13.  Giving 5 mL bolus normal saline.  Will cut down Lasix to 40 mg daily from tomorrow was started 40 twice daily today after holding for couple of days.  He is off amlodipine, will decrease the Coreg further.  I suspect this is in the setting of his diuresis total abt 11 liter negative balance  HTN: Orthostatic hypotension as above.  Decrease Coreg to 3/125 mg ( was on 25 mg before), wil decrease Lasix give bolus 500 ml NS x1.  He has been taken off of his  amlodipine already.   Foreign body-hearing aid in  GI tract: Unable to be retrieved with EGD.  X-ray showed foreign body likely in transverse colon> descending colon-on psylium and MiraLAX Bid as per Gi-x-ray result pending no bowel movement yet.  Monitor.  GI has signed off.  Duodenitis/PUD/nonbleeding gastric ulcer in  EGD 4/7.GI advised to  hold anticoagulation/Eliquis for 5 days will resume Eliquis today.Continue PPI twice daily.  H. pylori serology is negative.  Multiple fractures of ribs,bilateral,due to fall: Continue lidocaine, Tylenol.  Patient and family reluctant with any kind of opiates as he is even sensitive to Tylenol.   Acute encephalopathy following IV morphine in the ED. resolved.  Avoid narcotics.  Hyponatremia: Suspect hypervolemic hyponatremia.  Resolved with diuretics and fluid restriction 1.2 L.  Appreciate nephrology input. Recent Labs  Lab 12/24/20 0338 12/25/20 0253 12/26/20 0326 12/27/20 0611 12/28/20 0639  NA 128* 130* 135 136 137   AKI on CKD 3b-PCP records shows baseline creatinine 4/21 was 3.8. No value since then, possibly could have progressed since and holding steady in 4.3 range- CKD stage 5, likely new baseline.his CT shows bilateral complex renal cyst.  Seen by nephrology and signed off.  Lasix was resumed today 40 mg twice daily given his orthostatic we will switch to 40 mg daily from tomorrow.  Good negative net balance so far.  BUN/creatinine appears to be improving.  Patient will need to follow-up with nephrology outpatient and they are planning to see Dr. Hollie Salk upon discharge- info provided. Recent Labs  Lab 12/24/20 0338 12/25/20 0253 12/26/20 0326 12/27/20 0611 12/28/20 0639  BUN 86* 94* 87* 82* 74*  CREATININE 4.54* 4.65* 4.30* 4.11* 4.00*  Net IO Since Admission: -10,904 mL [12/28/20 1332]  Intake/Output Summary (Last 24 hours) at 12/28/2020 1332 Last data filed at 12/28/2020 0800 Gross per 24 hour  Intake 1020 ml  Output 825 ml  Net 195 ml   Iron deficiency anemia:guaiac was noted to be negative.  Hb stable 8- 9 g.   Recent Labs  Lab 12/24/20 0338 12/25/20 0253 12/26/20 0326 12/27/20 0610 12/28/20 0639  HGB 9.0* 8.7* 8.4* 8.9* 9.2*  HCT 25.9* 25.3* 24.6* 26.3* 27.9*   Leukocytosis, unclear etiology could be reactive.  Overall much better currently in 10 to 11 k.   Recent Labs  Lab 12/24/20 0338 12/25/20 0253 12/26/20 0326 12/27/20 0610 12/28/20 0639  WBC 10.7* 16.6* 10.3 11.4* 11.8*   CAD S/P angioplasty 2013:trop neg 14>13.continue his aspirin, beta-blocker and statin.  Acute on chronic CHF exacerbation w/ preserved EF:BNP elevated and was hypervolemic on exam. Echo shows normal LV function suspect CHF with preserved EF.  Diuresis with IV Lasix.  Now continue oral Lasix as blood pressure allows, continue Coreg.  Cardiology has signed off. Appreciate cardiology nephrology input Net IO Since Admission: -10,904 mL [12/28/20 1332]  Filed Weights   12/20/20 2325 12/24/20 0357 12/25/20 2026  Weight: 115 kg 95.4 kg 97.6 kg   Permanent A. fib on Eliquis now with multiple falls rate controlled on Coreg. DOAC is held for EGD and  Now 2/2 stmomach ulcer- GI advised to hold for 5 days.we will resume Eliquis today.paient is here for temporarily from [place] and is followed by cardiologist in [place].  Thrombocytopenia: resolved  Recent Labs  Lab 12/24/20 0338 12/25/20 0253 12/26/20 0326 12/27/20 0610 12/28/20 0639  PLT 157 160 153 159 157   Hypercalcemia: Calcium level is stable 9.9.  Hyperlipidemia continue statin.   MorBid Obesity BMI 34: Patient will benefit  with weight loss PCP follow-up.  Diet Order            DIET SOFT Room service appropriate? Yes; Fluid consistency: Thin; Fluid restriction: 1200 mL Fluid  Diet effective now               Patient's Body mass index is 29.18 kg/m.  DVT prophylaxis: SCD.  Anticoagulation on hold due to gastric ulcer plan to resume on 4/13 Code Status:   Code Status: Full Code  Family Communication: plan of care discussed with patient and his daughter and son at the bedside extensively today.   Status is: Inpatient Remains inpatient appropriate because:IV treatments appropriate due to intensity of illness or inability to take PO and Inpatient level of care appropriate due to severity of  illness  Dispo: The patient is from: Home              Anticipated d/c is to: SNF versus home health.  Family undecided, social worker already searching for bed.  Will be high risk for readmission if goes home.              Patient currently is not medically stable to d/c.   Difficult to place patient No Unresulted Labs (From admission, onward)          Start     Ordered   12/28/20 1025  Basic metabolic panel  Daily,   R     Question:  Specimen collection method  Answer:  Lab=Lab collect   12/27/20 1101        Medications reviewed:  Scheduled Meds: . allopurinol  100 mg Oral Daily  . aspirin EC  81 mg Oral Daily  . carvedilol  6.25 mg Oral BID WC  . furosemide  40 mg Oral BID  . latanoprost  1 drop Both Eyes QHS  . lidocaine  1 patch Transdermal Q24H  . lidocaine  1 patch Transdermal Q24H  . magnesium citrate  1 Bottle Oral Once  . melatonin  3 mg Oral QHS  . pantoprazole  40 mg Oral BID AC  . polyethylene glycol  17 g Oral BID  . psyllium  1 packet Oral TID  . rosuvastatin  5 mg Oral Q M,W,F  . sodium chloride flush  3 mL Intravenous Q12H   Continuous Infusions: . sodium chloride      Consultants: Nephrology, cardiology GI   Procedures:see note EGD Medium-sized hiatal hernia. - Non-bleeding gastric ulcer with no stigmata of bleeding. - Gastritis. - Duodenitis. - No foreign body seen to the extent of our examination (D2). Impression: None Moderate Sedation: - Return patient to hospital ward for ongoing care. - Soft diet today. - Continue present medications. - Based on finding of ulcer, would hold Apixaban for another 5 days, if at all possible. - Pantoprazole 40 mg po bid x 6 weeks, then 40 mg po qd thereafter indefinitely. - Perform an H. pylori serology tomorrow. - Miralax 17 grams po bid, to facilitate passage of foreign body. - Serial (daily) abdominal xrays. - [facility] will follow  Antimicrobials: Anti-infectives (From admission, onward)   None      Culture/Microbiology No results found for: SDES, SPECREQUEST, CULT, REPTSTATUS  Other culture-see note  Objective: Vitals: Today's Vitals   12/27/20 2250 12/28/20 0517 12/28/20 0931 12/28/20 1300  BP:  (!) 146/77 135/74 109/63  Pulse:  74 62   Resp:  18 17   Temp:  (!) 97.5 F (36.4 C) (!) 97.5 F (36.4 C)  TempSrc:  Oral Oral   SpO2:  98% 98%   Weight:      Height:      PainSc: Asleep       Intake/Output Summary (Last 24 hours) at 12/28/2020 1332 Last data filed at 12/28/2020 0800 Gross per 24 hour  Intake 1020 ml  Output 825 ml  Net 195 ml   Filed Weights   12/20/20 2325 12/24/20 0357 12/25/20 2026  Weight: 115 kg 95.4 kg 97.6 kg   Weight change:   Intake/Output from previous day: 04/12 0701 - 04/13 0700 In: 0923 [P.O.:1320; I.V.:3] Out: 1125 [Urine:1125] Intake/Output this shift: Total I/O In: 300 [P.O.:300] Out: -  Filed Weights   12/20/20 2325 12/24/20 0357 12/25/20 2026  Weight: 115 kg 95.4 kg 97.6 kg    Examination: General exam: Was resting comfortably, RA, NAD, weak appearing. HEENT:Oral mucosa moist, Ear/Nose WNL grossly, dentition normal. Respiratory system: bilaterally CLEAR,no wheezing or crackles,no use of accessory muscle Cardiovascular system: S1 & S2 +, No JVD,. Gastrointestinal system: Abdomen soft, obese NT,ND, BS+ Nervous System:Alert, awake, moving extremities and grossly nonfocal Extremities: No edema, distal peripheral pulses palpable.  Skin: No rashes,no icterus. MSK: Normal muscle bulk,tone, power Tender to palpation shoulders.  Data Reviewed: I have personally reviewed following labs and imaging studies CBC: Recent Labs  Lab 12/24/20 0338 12/25/20 0253 12/26/20 0326 12/27/20 0610 12/28/20 0639  WBC 10.7* 16.6* 10.3 11.4* 11.8*  HGB 9.0* 8.7* 8.4* 8.9* 9.2*  HCT 25.9* 25.3* 24.6* 26.3* 27.9*  MCV 89.0 91.3 91.1 92.0 94.3  PLT 157 160 153 159 300   Basic Metabolic Panel: Recent Labs  Lab 12/21/20 1921  12/22/20 0754 12/24/20 0338 12/25/20 0253 12/26/20 0326 12/27/20 0611 12/28/20 0639  NA 123*   < > 128* 130* 135 136 137  K 4.3   < > 3.7 3.6 3.2* 3.5 3.8  CL 91*   < > 93* 95* 97* 97* 98  CO2 23   < > 22 25 29 30 31   GLUCOSE 110*   < > 167* 149* 118* 107* 104*  BUN 77*   < > 86* 94* 87* 82* 74*  CREATININE 4.43*   < > 4.54* 4.65* 4.30* 4.11* 4.00*  CALCIUM 9.9   < > 9.7 9.7 9.7 9.5 9.7  PHOS 5.9*  --   --   --   --   --   --    < > = values in this interval not displayed.   GFR: Estimated Creatinine Clearance: 16.3 mL/min (A) (by C-G formula based on SCr of 4 mg/dL (H)). Liver Function Tests: Recent Labs  Lab 12/21/20 1921  ALBUMIN 3.0*   No results for input(s): LIPASE, AMYLASE in the last 168 hours. No results for input(s): AMMONIA in the last 168 hours. Coagulation Profile: No results for input(s): INR, PROTIME in the last 168 hours. Cardiac Enzymes: No results for input(s): CKTOTAL, CKMB, CKMBINDEX, TROPONINI in the last 168 hours. BNP (last 3 results) No results for input(s): PROBNP in the last 8760 hours. HbA1C: No results for input(s): HGBA1C in the last 72 hours. CBG: Recent Labs  Lab 12/26/20 0813  GLUCAP 116*   Lipid Profile: No results for input(s): CHOL, HDL, LDLCALC, TRIG, CHOLHDL, LDLDIRECT in the last 72 hours. Thyroid Function Tests: No results for input(s): TSH, T4TOTAL, FREET4, T3FREE, THYROIDAB in the last 72 hours. Anemia Panel: No results for input(s): VITAMINB12, FOLATE, FERRITIN, TIBC, IRON, RETICCTPCT in the last 72 hours. Sepsis Labs: No results for input(s): PROCALCITON,  LATICACIDVEN in the last 168 hours.  Recent Results (from the past 240 hour(s))  Resp Panel by RT-PCR (Flu A&B, Covid) Nasopharyngeal Swab     Status: None   Collection Time: 12/21/20  2:45 AM   Specimen: Nasopharyngeal Swab; Nasopharyngeal(NP) swabs in vial transport medium  Result Value Ref Range Status   SARS Coronavirus 2 by RT PCR NEGATIVE NEGATIVE Final     Comment: (NOTE) SARS-CoV-2 target nucleic acids are NOT DETECTED.  The SARS-CoV-2 RNA is generally detectable in upper respiratory specimens during the acute phase of infection. The lowest concentration of SARS-CoV-2 viral copies this assay can detect is 138 copies/mL. A negative result does not preclude SARS-Cov-2 infection and should not be used as the sole basis for treatment or other patient management decisions. A negative result may occur with  improper specimen collection/handling, submission of specimen other than nasopharyngeal swab, presence of viral mutation(s) within the areas targeted by this assay, and inadequate number of viral copies(<138 copies/mL). A negative result must be combined with clinical observations, patient history, and epidemiological information. The expected result is Negative.  Fact Sheet for Patients:  EntrepreneurPulse.com.au  Fact Sheet for Healthcare Providers:  IncredibleEmployment.be  This test is no t yet approved or cleared by the [place] FDA and  has been authorized for detection and/or diagnosis of SARS-CoV-2 by FDA under an Emergency Use Authorization (EUA). This EUA will remain  in effect (meaning this test can be used) for the duration of the COVID-19 declaration under Section 564(b)(1) of the Act, 21 U.S.C.section 360bbb-3(b)(1), unless the authorization is terminated  or revoked sooner.       Influenza A by PCR NEGATIVE NEGATIVE Final   Influenza B by PCR NEGATIVE NEGATIVE Final    Comment: (NOTE) The Xpert Xpress SARS-CoV-2/FLU/RSV plus assay is intended as an aid in the diagnosis of influenza from Nasopharyngeal swab specimens and should not be used as a sole basis for treatment. Nasal washings and aspirates are unacceptable for Xpert Xpress SARS-CoV-2/FLU/RSV testing.  Fact Sheet for Patients: EntrepreneurPulse.com.au  Fact Sheet for Healthcare  Providers: IncredibleEmployment.be  This test is not yet approved or cleared by the [place] FDA and has been authorized for detection and/or diagnosis of SARS-CoV-2 by FDA under an Emergency Use Authorization (EUA). This EUA will remain in effect (meaning this test can be used) for the duration of the COVID-19 declaration under Section 564(b)(1) of the Act, 21 U.S.C. section 360bbb-3(b)(1), unless the authorization is terminated or revoked.  Performed at [facility], [place] [address], [place], [place] [address]      Radiology Studies: DG Abd 1 View  Result Date: 12/27/2020 CLINICAL DATA:  Evaluate for foreign body. Patient swallowed hearing aid. EXAM: ABDOMEN - 1 VIEW COMPARISON:  12/26/2020 FINDINGS: Blunting at the left costophrenic angle. Few densities at the left lung base. The metallic foreign body has migrated into the pelvis and located in the region of the rectum. Nonobstructive bowel gas pattern. Calcification overlying the right upper quadrant of the abdomen. IMPRESSION: Metallic foreign body has migrated into the pelvis and likely within the rectum. Nonobstructive bowel gas pattern. Mild blunting at the left costophrenic angle. Findings could be related to a small effusion and atelectasis. Electronically Signed   By: Markus Daft M.D.   On: 12/27/2020 08:58     LOS: 7 days   Antonieta Pert, MD [facility]  12/28/2020, 1:32 PM

## 2020-12-28 NOTE — Progress Notes (Signed)
Physical Therapy Treatment Patient Details Name: [name] [name] MRN: [id number] DOB: [birth year] Today's Date: 12/28/2020    History of Present Illness Pt adm 4/5 with falls and hyponatremia. Pt found to have bilateral nondisplaced anterior rib fx's. Pt also inadvertantly had swallowed his hearing aide. Hospital course complicated by postural hypotension; GI to retrieve hearing aide. PMH - HTN, afib, cad, chf, ckd, prostate CA    PT Comments    Continuing work on functional mobility and activity tolerance;  Session focused on mobilizing OOB to recliner, and original goal was to work on walking; [name] [name] made an excellent effort at getting up and transferring, however fatigue set in as early as getting up to the EOB; sat with his trunk flexed throughout the session, and he could correct with cues, but sank back into trunk flexion within 10 seconds; able to stand with 2 person assist, incr time, and RW, but as fatigue set in he required the stedy for transferring to the recliner; Once there, noted pt was particualrly tired, took BP, and saw SBP was in the 70s; [name] Heir, [name] [name], and [employer] NT came quickly -- [name] [name] contacted Dr. Lupita Leash; We employed the [facility] to assist pt back to bed, and Allyson, [name] [name] started IV fluids; Observed BPs as follows:     12/28/20 1245 12/28/20 1250  Orthostatic Lying   BP- Lying 132/89  --   Orthostatic Sitting  BP- Sitting 155/77 (!) 71/38 (reclined, feet up)  Pulse- Sitting 78 70 ([name] [name] and NT in the room to assist with mechanical lift back to bed)  Orthostatic Standing at 0 minutes  BP- Standing at 0 minutes  (Unable to obtain standing pules)  --     Follow Up Recommendations  SNF     Equipment Recommendations  Wheelchair (measurements PT);Wheelchair cushion (measurements PT)    Recommendations for Other Services       Precautions / Restrictions Precautions Precautions: Fall Precaution Comments: Watch for postural hypotension; son reports about 5 falls in the  past 3 months Restrictions Weight Bearing Restrictions: No    Mobility  Bed Mobility Overal bed mobility: Needs Assistance Bed Mobility: Rolling;Sidelying to Sit;Sit to Sidelying Rolling: Min assist Sidelying to sit: Mod assist;HOB elevated       General bed mobility comments: Cues to reach for bed rails to pull into sidelyingAssist to bring legs off of bed and elevate trunk into sitting. Assist to bring legs back up into bed returning to sidelying    Transfers Overall transfer level: Needs assistance Equipment used: Rolling walker (2 wheeled) Transfers: Sit to/from Stand Sit to Stand: Mod assist;+2 safety/equipment         General transfer comment: Cues for hand placement; Light mod assist to power up; cues to self-monitor for lightheadedness/presyncopy; needing heaiver mod assist to stnad as he fatigued throughout session; stood from bedx3, then to stedy; once in recliner, noted hypotension, and used Maximove for return to bed  Ambulation/Gait             General Gait Details: lots of difficulty attempting steps, liekly due to hypotension   Stairs             Wheelchair Mobility    Modified Rankin (Stroke Patients Only)       Balance     Sitting balance-Leahy Scale: Poor Sitting balance - Comments: needing more hands-on assist to day for sitting balance; notably tending to lean forward and lose balance Postural control: Other (comment) (anterior lean) Standing balance support: Bilateral upper extremity supported  Standing balance-Leahy Scale: Poor                              Cognition Arousal/Alertness: Awake/alert Behavior During Therapy: WFL for tasks assessed/performed Overall Cognitive Status: Impaired/Different from baseline Area of Impairment: Problem solving                             Problem Solving: Requires verbal cues;Requires tactile cues;Slow processing;Difficulty sequencing General Comments: Able to  identify that he is leaning (forward, backward, or laterally), but difficulty correcting      Exercises      General Comments General comments (skin integrity, edema, etc.): Reporting fatigue throughout session -- but still giving good effort and participation; given VERY LOW BPs in recliner (even near supine in recliner), I wonder if the fatigue he reported was actually BP dropping      Pertinent Vitals/Pain Pain Assessment: Faces Faces Pain Scale: Hurts even more Pain Location: Shoulders with any touch Pain Descriptors / Indicators: Guarding;Grimacing Pain Intervention(s): Monitored during session    Home Living                      Prior Function            PT Goals (current goals can now be found in the care plan section) Acute Rehab PT Goals Patient Stated Goal: Very motivated to get up and move and walk PT Goal Formulation: With patient/family Time For Goal Achievement: 01/05/21 Potential to Achieve Goals: Good Progress towards PT goals: Progressing toward goals (though limited by decr BP with upright activity)    Frequency    Min 2X/week      PT Plan Current plan remains appropriate    Co-evaluation              AM-PAC PT "6 Clicks" Mobility   Outcome Measure  Help needed turning from your back to your side while in a flat bed without using bedrails?: A Little Help needed moving from lying on your back to sitting on the side of a flat bed without using bedrails?: A Lot Help needed moving to and from a bed to a chair (including a wheelchair)?: A Lot Help needed standing up from a chair using your arms (e.g., wheelchair or bedside chair)?: A Lot Help needed to walk in hospital room?: A Lot Help needed climbing 3-5 steps with a railing? : Total 6 Click Score: 12    End of Session Equipment Utilized During Treatment: Gait belt Activity Tolerance: Patient limited by fatigue;Other (comment) (Orthostatic again today) Patient left: in bed;with  call bell/phone within reach;with bed alarm set;with family/visitor present;with nursing/sitter in room Nurse Communication: Mobility status (low BPs) PT Visit Diagnosis: Other abnormalities of gait and mobility (R26.89);Muscle weakness (generalized) (M62.81);History of falling (Z91.81)     Time: 1205-1300 PT Time Calculation (min) (ACUTE ONLY): 55 min  Charges:  $Therapeutic Activity: 38-52 mins                     Roney Marion, [place]  Acute Rehabilitation Services Pager [phone] Office [place] 12/28/2020, 4:45 PM

## 2020-12-29 DIAGNOSIS — S2243XA Multiple fractures of ribs, bilateral, initial encounter for closed fracture: Secondary | ICD-10-CM | POA: Diagnosis not present

## 2020-12-29 LAB — CBC
HCT: 28 % — ABNORMAL LOW (ref 39.0–52.0)
Hemoglobin: 9.1 g/dL — ABNORMAL LOW (ref 13.0–17.0)
MCH: 31.1 pg (ref 26.0–34.0)
MCHC: 32.5 g/dL (ref 30.0–36.0)
MCV: 95.6 fL (ref 80.0–100.0)
Platelets: 146 10*3/uL — ABNORMAL LOW (ref 150–400)
RBC: 2.93 MIL/uL — ABNORMAL LOW (ref 4.22–5.81)
RDW: 16.3 % — ABNORMAL HIGH (ref 11.5–15.5)
WBC: 11.3 10*3/uL — ABNORMAL HIGH (ref 4.0–10.5)
nRBC: 0 % (ref 0.0–0.2)

## 2020-12-29 LAB — BASIC METABOLIC PANEL
Anion gap: 8 (ref 5–15)
BUN: 70 mg/dL — ABNORMAL HIGH (ref 8–23)
CO2: 29 mmol/L (ref 22–32)
Calcium: 9.8 mg/dL (ref 8.9–10.3)
Chloride: 98 mmol/L (ref 98–111)
Creatinine, Ser: 4.13 mg/dL — ABNORMAL HIGH (ref 0.61–1.24)
GFR, Estimated: 13 mL/min — ABNORMAL LOW (ref >60)
Glucose, Bld: 109 mg/dL — ABNORMAL HIGH (ref 70–99)
Potassium: 4 mmol/L (ref 3.5–5.1)
Sodium: 135 mmol/L (ref 135–145)

## 2020-12-29 MED ORDER — BISACODYL 10 MG RE SUPP
10.0000 mg | Freq: Once | RECTAL | Status: AC
  Administered 2020-12-29: 10 mg via RECTAL
  Filled 2020-12-29: qty 1

## 2020-12-29 NOTE — Progress Notes (Signed)
Occupational Therapy Treatment Patient Details Name: [name] [name] MRN: [id number] DOB: 1933/05/18 Today's Date: 12/29/2020    History of present illness Pt adm 4/5 with falls and hyponatremia. Pt found to have bilateral nondisplaced anterior rib fx's. Pt also inadvertantly had swallowed his hearing aide. Hospital course complicated by postural hypotension; GI to retrieve hearing aide. PMH - HTN, afib, cad, chf, ckd, prostate CA   OT comments  Patient continues to try very hard, and is motivated to become more mobile.  Patient was able to progress to edge of bed seated grooming with setup and min guard for sitting balance.  Patient continues to be very painful with "hypersensativity" to touch with bilateral upper extremities.  OT to continue to follow in the acute setting, but SNF continues to be recommended.  His spouse is available to assist, but unfortunately he is requiring +2 for basic transfers.    Follow Up Recommendations  SNF    Equipment Recommendations  None recommended by OT    Recommendations for Other Services      Precautions / Restrictions Precautions Precautions: Fall Precaution Comments: Watch for postural hypotension; son reports about 5 falls in the past 3 months Restrictions Weight Bearing Restrictions: No Other Position/Activity Restrictions: rib fractures       Mobility Bed Mobility Overal bed mobility: Needs Assistance Bed Mobility: Supine to Sit     Supine to sit: Mod assist   Sit to sidelying: HOB elevated      Transfers Overall transfer level: Needs assistance Equipment used: Rolling walker (2 wheeled) Transfers: Sit to/from [facility] Sit to Stand: Mod assist;+2 safety/equipment Stand pivot transfers: Mod assist;+2 safety/equipment            Balance   Sitting-balance support: Feet supported;Single extremity supported Sitting balance-[name] [name] Scale: Poor     Standing balance support: Bilateral upper extremity  supported Standing balance-[name] [name] Scale: Poor                             ADL either performed or assessed with clinical judgement   ADL       Grooming: Sitting;Min guard               Lower Body Dressing: Sitting/lateral leans;Maximal assistance                                         Cognition  WFL's,  He is HOH                                                                Pertinent Vitals/ Pain       Faces Pain Scale: Hurts whole lot Pain Location: Shoulders with any touch Pain Descriptors / Indicators: Guarding;Grimacing Pain Intervention(s): Monitored during session                                                          Frequency  Min 2X/week  Progress Toward Goals  OT Goals(current goals can now be found in the care plan section)  Progress towards OT goals: Progressing toward goals  Acute Rehab OT Goals Patient Stated Goal: Very motivated to get up and move and walk OT Goal Formulation: With family Time For Goal Achievement: 01/05/21 Potential to Achieve Goals: Good  Plan      Co-evaluation                 AM-PAC OT "6 Clicks" Daily Activity     Outcome Measure   Help from another person eating meals?: None Help from another person taking care of personal grooming?: A Little Help from another person toileting, which includes using toliet, bedpan, or urinal?: A Lot Help from another person bathing (including washing, rinsing, drying)?: A Lot Help from another person to put on and taking off regular upper body clothing?: A Lot Help from another person to put on and taking off regular lower body clothing?: A Lot 6 Click Score: 15    End of Session    OT Visit Diagnosis: Other abnormalities of gait and mobility (R26.89);Repeated falls (R29.6);Muscle weakness (generalized) (M62.81);History of falling (Z91.81);Pain;Other symptoms and signs involving  cognitive function Pain - part of body: Shoulder;Arm   Activity Tolerance Patient limited by lethargy   Patient Left with call bell/phone within reach;with family/visitor present;in chair   Nurse Communication Mobility status        Time: 7711-6579 OT Time Calculation (min): 17 min  Charges: OT General Charges $OT Visit: 1 Visit OT Treatments $Self Care/Home Management : 8-22 mins  12/29/2020  [name] [name], OTR/L  Acute Rehabilitation Services  Office:  [phone]    [name] [name] 12/29/2020, 2:17 PM

## 2020-12-29 NOTE — Progress Notes (Signed)
Physical Therapy Treatment Patient Details Name: [name] [name] MRN: [id number] DOB: [birth year] Today's Date: 12/29/2020    History of Present Illness Pt adm 4/5 with falls and hyponatremia. Pt found to have bilateral nondisplaced anterior rib fx's. Pt also inadvertantly had swallowed his hearing aide. Hospital course complicated by postural hypotension; GI to retrieve hearing aide. PMH - HTN, afib, cad, chf, ckd, prostate CA    PT Comments    Pt sitting EOB with OT upon PT arrival to room, with plan of transfer OOB to recliner. Pt transferred to recliner via stand pivot with mod +2 assist, pt demonstrating significant LE weakness and states "my legs are getting weaker" when close to the chair requiring assist for slow eccentric lower and placement in recliner. Pt tolerated LE exercises for strengthening well, within limits of pt fatigue, back pain, and weakness. Pt comfortable in chair upon PT exit, will continue to follow acutely.     Follow Up Recommendations  SNF     Equipment Recommendations  Wheelchair (measurements PT);Wheelchair cushion (measurements PT)    Recommendations for Other Services       Precautions / Restrictions Precautions Precautions: Fall Precaution Comments: Watch for postural hypotension; son reports about 5 falls in the past 3 months Restrictions Weight Bearing Restrictions: No Other Position/Activity Restrictions: rib fractures    Mobility  Bed Mobility Overal bed mobility: Needs Assistance Bed Mobility: Supine to Sit     Supine to sit: Mod assist   Sit to sidelying: HOB elevated General bed mobility comments: sitting EOB with OT upon PT arrival to room, preparing for stand pivot to recliner.    Transfers Overall transfer level: Needs assistance Equipment used: Rolling walker (2 wheeled) Transfers: Sit to/from [facility] Sit to Stand: Mod assist;+2 safety/equipment Stand pivot transfers: Mod assist;+2 safety/equipment        General transfer comment: mod +2 for power up via posterior facilitation, rise, and steadying; pt requiring mod +2 for pivot to recliner for maintaining standing as pt with increased knee flexion and truncal flexion with fatigue. Pt with unsafe descent into recliner requiring PT and OT to guide pt hips into correct spot  Ambulation/Gait             General Gait Details: Pivotal steps only to reach recliner   Stairs             Wheelchair Mobility    Modified Rankin (Stroke Patients Only)       Balance Overall balance assessment: Needs assistance;History of Falls Sitting-balance support: Feet supported;Single extremity supported Sitting balance-Leahy Scale: Fair     Standing balance support: Bilateral upper extremity supported Standing balance-Leahy Scale: Poor Standing balance comment: reliant on external support                            Cognition Arousal/Alertness: Awake/alert Behavior During Therapy: WFL for tasks assessed/performed Overall Cognitive Status: Impaired/Different from baseline Area of Impairment: Problem solving                             Problem Solving: Requires verbal cues;Slow processing;Difficulty sequencing;Requires tactile cues General Comments: pt requiring repeated verbal cues and occasional tactile cuing for mobility, suspect at least partially due to [place] Exercises - Lower Extremity Long Arc Quad: AROM;Both;10 reps;Seated Hip Flexion/Marching: AAROM;Both;10 reps;Seated    General Comments  Pertinent Vitals/Pain Pain Assessment: Faces Faces Pain Scale: Hurts whole lot Pain Location: upper back, LEs during exercise L>R Pain Descriptors / Indicators: Guarding;Grimacing Pain Intervention(s): Limited activity within patient's tolerance;Monitored during session;Repositioned    Home Living                      Prior Function            PT Goals (current goals  can now be found in the care plan section) Acute Rehab PT Goals Patient Stated Goal: Very motivated to get up and move and walk PT Goal Formulation: With patient/family Time For Goal Achievement: 01/05/21 Potential to Achieve Goals: Good Progress towards PT goals: Progressing toward goals    Frequency    Min 2X/week      PT Plan Current plan remains appropriate    Co-evaluation              AM-PAC PT "6 Clicks" Mobility   Outcome Measure  Help needed turning from your back to your side while in a flat bed without using bedrails?: A Little Help needed moving from lying on your back to sitting on the side of a flat bed without using bedrails?: A Lot Help needed moving to and from a bed to a chair (including a wheelchair)?: A Lot Help needed standing up from a chair using your arms (e.g., wheelchair or bedside chair)?: A Lot Help needed to walk in hospital room?: A Lot Help needed climbing 3-5 steps with a railing? : Total 6 Click Score: 12    End of Session   Activity Tolerance: Patient limited by fatigue Patient left: with call bell/phone within reach;with family/visitor present;in chair;with chair alarm set Nurse Communication: Mobility status;Need for lift equipment (NT notified to lift pt back to chair given significant fatigue) PT Visit Diagnosis: Other abnormalities of gait and mobility (R26.89);Muscle weakness (generalized) (M62.81);History of falling (Z91.81)     Time: 1207-1217 PT Time Calculation (min) (ACUTE ONLY): 10 min  Charges:  $Therapeutic Activity: 8-22 mins                     Stacie Glaze, PT DPT Acute Rehabilitation Services Pager [phone]  Office [phone]    Louis Matte 12/29/2020, 3:49 PM

## 2020-12-29 NOTE — Progress Notes (Signed)
Pt had BM and passed hearing aid. Notified MD [place] via secure chat.    Paulla Fore, RN, BSN

## 2020-12-29 NOTE — Progress Notes (Signed)
PROGRESS NOTE    [name] [name]  ZCH:[id number] DOB: [birth year] DOA: 12/20/2020 PCP: Antony Contras, MD   Chief Complaint  Patient presents with  . Fall    Taking Eliquis  Brief Narrative: 33 old male with history of hypertension, A. fib on Eliquis, CAD history of PCI in 2878, chronic systolic CHF EF [id number]-[id number], GERD, chronic kidney disease stage III brought to the ED after fall at home.  He has had recent falls and while at the shower on 4/5 he fell.  Patient reported that he lost balance. Per report denies any loss of consciousness or trauma to his head. He has been ambulating with use of a walker.  Apparently patient has had severe back pain and sciatica for which he has been seen by physical therapy and has a scheduled appointment with orthopedics.  Due to the pain in his back he has not been sleeping well at night In the ED,he was afebrile,saturating well on room air,CT scan of the head and cervical spine showed no acute abnormality, but did note a partially visualized right-sided pleural effusion.  Labs significant for for hemoglobin 8.7,platelets 140, sodium 122,chloride 92,CO2 20,BUN 80, creatinine 4.45, calcium 10.8, AST 63, serum osmolarity 287, and TSH 2.577.  Urinalysis was positive for 30 mg/dL protein, but showed no significant signs of infection.  CT scan of the chest for bilateral nondisplaced rib fractures, a metallic foreign body in the gastric body measuring 2.1 cm possibly hearing aid, possible duodenitis/peptic ulcer disease, cardiomegaly with signs of fluid overload with pulmonary edema, and a 4.2 cm ascending aortic aneurysm.Patient was given normal saline IVFs at 75 ml/hr, Patient was admitted and nephrology, gastroenterology and cardiology were consulted. Patient became lethargic after getting morphine in the ED. Patient was followed by GI nephrology cardiology. Underwent EGD that showed stomach ulcer no foreign body-GI advised to hold off anticoagulation until 4/13. xray  showed foreign body that has moved below his stoma placed on laxatives and foreign body is further moving currentlly  in rectum 4/12 Had fainting/vasovagal episode while in BM on 4/11 meds were adjusted. Again was orthostatic blood pressure in 70s on standing 4/13- givne iv boluses and meds adjusted  Subjective: Seen and examined this morning. Daughter at the bedside. Overnight blood pressure remained stable 140s to 150s. Has not had a bowel movement.  Has bilateral shoulder pain,overall is stable.  Assessment & Plan:  Multiple falls at home Chronic low back: He was scheduled to see [facility] 4/18 as outpatient, has had at least 4  Falls at home  likely multifactorial in setting of hyponatremia and fluid overload chronic low back pain and physical deconditioning.  Remains very deconditioned and weak frail.  PT OT working with him and advised skilled nursing facility.continue lidocaine patch Tylenol for pain control, he is sensitive to narcotics patient and family refusing even the tramadol.  He has a foreign body in his intestine once it is out, can consider MRI lumbar spine, has follow-up with orthopedics 4/18.    Vasovagal episode/syncope while having BM 4/11 am again orthostatic hypotension 4/13-s/p 500 mL bolus normal saline.  Lasix Cortiment 40 mg daily.  Urine output adequate 1600 mL net  abt 11 liters.  He is off amlodipine, Coreg decrease further to 3.25 mg.  Will order TED hose.  HTN: Orthostatic hypotension in the setting of his diuresis.Decreased Coreg to 3/125 mg (was on 25 mg before), decreased Lasix and given bolus 500 ml NS x1 4/13. He has been taken off of his  amlodipine few days ago.   Foreign body-hearing aid in  GI tract: Continue Cyclomen MiraLAX as per GI.  X-ray 4/13 7 foreign body still in rectum.  Dulcolax PR x1 awaiting for BM .  Duodenitis/PUD/nonbleeding gastric ulcer in  EGD 4/7.GI advised to hold anticoagulation/Eliquis for 5 days  And resumed Eliquis 4/13  pm.Continue PPI twice daily.  H. pylori serology is negative.  Multiple fractures of ribs,bilateral,due to fall: Continue lidocaine, Tylenol.  Patient and family reluctant with any kind of opiates as he is even sensitive to Tylenol.   Acute encephalopathy following IV morphine in the ED. resolved.  Avoid narcotics.  Hypervolemic hyponatremia:Resolved with diuretics and fluid restriction 1.2 L.  Appreciate nephrology input. Recent Labs  Lab 12/25/20 0253 12/26/20 0326 12/27/20 0611 12/28/20 0639 12/29/20 0307  NA 130* 135 136 137 135   AKI on CKD 3b-PCP records shows baseline creatinine 4/21 was 3.8. No value since then, possibly could have progressed since and holding steady in 4.3 range- CKD stage 5, likely new baseline.his CT shows bilateral complex renal cyst.  Seen by nephrology and signed off.  Lasix was resumed today 40 mg twice daily given his orthostatic we will switch to 40 mg daily from tomorrow.  Good negative net balance so far.  BUN/creatinine appears to be stable ~ 4.2,, he new will need to follow-up with nephrology outpatient and they are planning to see Dr. Hollie Salk upon discharge- info provided. Recent Labs  Lab 12/25/20 0253 12/26/20 0326 12/27/20 0611 12/28/20 0639 12/29/20 0307  BUN 94* 87* 82* 74* 70*  CREATININE 4.65* 4.30* 4.11* 4.00* 4.13*  Net IO Since Admission: -10,761 mL [12/29/20 1137]  Intake/Output Summary (Last 24 hours) at 12/29/2020 1137 Last data filed at 12/29/2020 1123 Gross per 24 hour  Intake 1740 ml  Output 1600 ml  Net 140 ml   Iron deficiency anemia:guaiac was noted to be negative.  Hb stable 8- 9 g.   Recent Labs  Lab 12/25/20 0253 12/26/20 0326 12/27/20 0610 12/28/20 0639 12/29/20 0307  HGB 8.7* 8.4* 8.9* 9.2* 9.1*  HCT 25.3* 24.6* 26.3* 27.9* 28.0*   Leukocytosis, unclear etiology could be reactive.  Overall much better currently in 10 to 11 k.  He is afebrile.  Monitor Recent Labs  Lab 12/25/20 0253 12/26/20 0326  12/27/20 0610 12/28/20 0639 12/29/20 0307  WBC 16.6* 10.3 11.4* 11.8* 11.3*   CAD S/P angioplasty 2013:trop neg 14>13.continue his aspirin, beta-blocker and statin.  Acute on chronic CHF exacerbation w/ preserved EF:BNP elevated and was hypervolemic on exam. Echo shows normal LV function suspect CHF with preserved EF.  Diuresed with IV Lasix.  Now continue oral Lasix as blood pressure allows, continue Coreg low dsoe.Cardiology has signed off. Appreciate cardiology nephrology input Net IO Since Admission: -10,761 mL [12/29/20 1137]  Filed Weights   12/20/20 2325 12/24/20 0357 12/25/20 2026  Weight: 115 kg 95.4 kg 97.6 kg   Permanent A. fib on Eliquis now with multiple falls rate controlled on Coreg. DOAC is held for EGD and  Now 2/2 stmomach ulcer- GI advised to hold for 5 days and resumed Eliquis appropriate renal dose 2.5 mg .paient is here for temporarily from [place] and is followed by cardiologist in [place].  Thrombocytopenia: resolved  Recent Labs  Lab 12/25/20 0253 12/26/20 0326 12/27/20 0610 12/28/20 0639 12/29/20 0307  PLT 160 153 159 157 146*   Hyperlipidemia continue statin.   Morbid Obesity BMI 34: Patient will benefit with weight loss and PCP follow-up.  Diet Order  DIET SOFT Room service appropriate? Yes; Fluid consistency: Thin; Fluid restriction: 1200 mL Fluid  Diet effective now               Patient's Body mass index is 29.18 kg/m.  DVT prophylaxis: SCD.  Anticoagulation on hold due to gastric ulcer plan to resume on 4/13 Code Status:   Code Status: Full Code  Family Communication: plan of care discussed with patient and his daughter at the bedside.    Status is: Inpatient Remains inpatient appropriate because:IV treatments appropriate due to intensity of illness or inability to take PO and Inpatient level of care appropriate due to severity of illness  Dispo: The patient is from: Home              Anticipated d/c is to: SNF versus home  health.  Family undecided, social worker already searching for bed.  Will be high risk for readmission if goes home.              Patient currently is not medically stable to d/c.   Difficult to place patient No Unresulted Labs (From admission, onward)          Start     Ordered   12/28/20 4174  Basic metabolic panel  Daily,   R     Question:  Specimen collection method  Answer:  Lab=Lab collect   12/27/20 1101        Medications reviewed:  Scheduled Meds: . allopurinol  100 mg Oral Daily  . apixaban  2.5 mg Oral BID  . aspirin EC  81 mg Oral Daily  . carvedilol  3.125 mg Oral BID WC  . furosemide  40 mg Oral Daily  . latanoprost  1 drop Both Eyes QHS  . lidocaine  1 patch Transdermal Q24H  . lidocaine  1 patch Transdermal Q24H  . magnesium citrate  1 Bottle Oral Once  . melatonin  3 mg Oral QHS  . pantoprazole  40 mg Oral BID AC  . polyethylene glycol  17 g Oral BID  . psyllium  1 packet Oral TID  . rosuvastatin  5 mg Oral Q M,W,F  . sodium chloride flush  3 mL Intravenous Q12H   Continuous Infusions: . sodium chloride      Consultants: Nephrology, cardiology GI   Procedures:see note EGD Medium-sized hiatal hernia. - Non-bleeding gastric ulcer with no stigmata of bleeding. - Gastritis. - Duodenitis. - No foreign body seen to the extent of our examination (D2). Impression: None Moderate Sedation: - Return patient to hospital ward for ongoing care. - Soft diet today. - Continue present medications. - Based on finding of ulcer, would hold Apixaban for another 5 days, if at all possible. - Pantoprazole 40 mg po bid x 6 weeks, then 40 mg po qd thereafter indefinitely. - Perform an H. pylori serology tomorrow. - Miralax 17 grams po bid, to facilitate passage of foreign body. - Serial (daily) abdominal xrays. - [facility] will follow  Antimicrobials: Anti-infectives (From admission, onward)   None     Culture/Microbiology No results found for: SDES,  SPECREQUEST, CULT, REPTSTATUS  Other culture-see note  Objective: Vitals: Today's Vitals   12/28/20 2302 12/28/20 2342 12/29/20 0510 12/29/20 0822  BP:   (!) 155/95 (!) 146/77  Pulse:   78 87  Resp:   16 18  Temp:   98.4 F (36.9 C) 98.2 F (36.8 C)  TempSrc:    Oral  SpO2:   97% 98%  Weight:  Height:      PainSc: 6  Asleep      Intake/Output Summary (Last 24 hours) at 12/29/2020 1137 Last data filed at 12/29/2020 1123 Gross per 24 hour  Intake 1740 ml  Output 1600 ml  Net 140 ml   Filed Weights   12/20/20 2325 12/24/20 0357 12/25/20 2026  Weight: 115 kg 95.4 kg 97.6 kg   Weight change:   Intake/Output from previous day: 04/13 0701 - 04/14 0700 In: 0277 [P.O.:1640; I.V.:3] Out: 1600 [Urine:1600] Intake/Output this shift: Total I/O In: 400 [P.O.:400] Out: 0  Filed Weights   12/20/20 2325 12/24/20 0357 12/25/20 2026  Weight: 115 kg 95.4 kg 97.6 kg    Examination: General exam: AAOx3, hard of hearing,NAD, weak appearing. HEENT:Oral mucosa moist, Ear/Nose WNL grossly, dentition normal. Respiratory system: bilaterally clear,no wheezing or crackles,no use of accessory muscle Cardiovascular system: S1 & S2 +, No JVD,. Gastrointestinal system: Abdomen soft, NT,ND, BS+ Nervous System:Alert, awake, moving extremities and grossly nonfocal Extremities: no edema, distal peripheral pulses palpable.  Skin: No rashes,no icterus. MSK: Normal muscle bulk,tone, power, tender shoulder area b/l even with slight touch-able to move arms  Data Reviewed: I have personally reviewed following labs and imaging studies CBC: Recent Labs  Lab 12/25/20 0253 12/26/20 0326 12/27/20 0610 12/28/20 0639 12/29/20 0307  WBC 16.6* 10.3 11.4* 11.8* 11.3*  HGB 8.7* 8.4* 8.9* 9.2* 9.1*  HCT 25.3* 24.6* 26.3* 27.9* 28.0*  MCV 91.3 91.1 92.0 94.3 95.6  PLT 160 153 159 157 412*   Basic Metabolic Panel: Recent Labs  Lab 12/25/20 0253 12/26/20 0326 12/27/20 0611 12/28/20 0639  12/29/20 0307  NA 130* 135 136 137 135  K 3.6 3.2* 3.5 3.8 4.0  CL 95* 97* 97* 98 98  CO2 25 29 30 31 29   GLUCOSE 149* 118* 107* 104* 109*  BUN 94* 87* 82* 74* 70*  CREATININE 4.65* 4.30* 4.11* 4.00* 4.13*  CALCIUM 9.7 9.7 9.5 9.7 9.8   GFR: Estimated Creatinine Clearance: 15.8 mL/min (A) (by C-G formula based on SCr of 4.13 mg/dL (H)). Liver Function Tests: No results for input(s): AST, ALT, ALKPHOS, BILITOT, PROT, ALBUMIN in the last 168 hours. No results for input(s): LIPASE, AMYLASE in the last 168 hours. No results for input(s): AMMONIA in the last 168 hours. Coagulation Profile: No results for input(s): INR, PROTIME in the last 168 hours. Cardiac Enzymes: No results for input(s): CKTOTAL, CKMB, CKMBINDEX, TROPONINI in the last 168 hours. BNP (last 3 results) No results for input(s): PROBNP in the last 8760 hours. HbA1C: No results for input(s): HGBA1C in the last 72 hours. CBG: Recent Labs  Lab 12/26/20 0813  GLUCAP 116*   Lipid Profile: No results for input(s): CHOL, HDL, LDLCALC, TRIG, CHOLHDL, LDLDIRECT in the last 72 hours. Thyroid Function Tests: No results for input(s): TSH, T4TOTAL, FREET4, T3FREE, THYROIDAB in the last 72 hours. Anemia Panel: No results for input(s): VITAMINB12, FOLATE, FERRITIN, TIBC, IRON, RETICCTPCT in the last 72 hours. Sepsis Labs: No results for input(s): PROCALCITON, LATICACIDVEN in the last 168 hours.  Recent Results (from the past 240 hour(s))  Resp Panel by RT-PCR (Flu A&B, Covid) Nasopharyngeal Swab     Status: None   Collection Time: 12/21/20  2:45 AM   Specimen: Nasopharyngeal Swab; Nasopharyngeal(NP) swabs in vial transport medium  Result Value Ref Range Status   SARS Coronavirus 2 by RT PCR NEGATIVE NEGATIVE Final    Comment: (NOTE) SARS-CoV-2 target nucleic acids are NOT DETECTED.  The SARS-CoV-2 RNA is generally  detectable in upper respiratory specimens during the acute phase of infection. The lowest concentration of  SARS-CoV-2 viral copies this assay can detect is 138 copies/mL. A negative result does not preclude SARS-Cov-2 infection and should not be used as the sole basis for treatment or other patient management decisions. A negative result may occur with  improper specimen collection/handling, submission of specimen other than nasopharyngeal swab, presence of viral mutation(s) within the areas targeted by this assay, and inadequate number of viral copies(<138 copies/mL). A negative result must be combined with clinical observations, patient history, and epidemiological information. The expected result is Negative.  Fact Sheet for Patients:  EntrepreneurPulse.com.au  Fact Sheet for Healthcare Providers:  IncredibleEmployment.be  This test is no t yet approved or cleared by the [place] FDA and  has been authorized for detection and/or diagnosis of SARS-CoV-2 by FDA under an Emergency Use Authorization (EUA). This EUA will remain  in effect (meaning this test can be used) for the duration of the COVID-19 declaration under Section 564(b)(1) of the Act, 21 U.S.C.section 360bbb-3(b)(1), unless the authorization is terminated  or revoked sooner.       Influenza A by PCR NEGATIVE NEGATIVE Final   Influenza B by PCR NEGATIVE NEGATIVE Final    Comment: (NOTE) The Xpert Xpress SARS-CoV-2/FLU/RSV plus assay is intended as an aid in the diagnosis of influenza from Nasopharyngeal swab specimens and should not be used as a sole basis for treatment. Nasal washings and aspirates are unacceptable for Xpert Xpress SARS-CoV-2/FLU/RSV testing.  Fact Sheet for Patients: EntrepreneurPulse.com.au  Fact Sheet for Healthcare Providers: IncredibleEmployment.be  This test is not yet approved or cleared by the [place] FDA and has been authorized for detection and/or diagnosis of SARS-CoV-2 by FDA under an Emergency Use  Authorization (EUA). This EUA will remain in effect (meaning this test can be used) for the duration of the COVID-19 declaration under Section 564(b)(1) of the Act, 21 U.S.C. section 360bbb-3(b)(1), unless the authorization is terminated or revoked.  Performed at [facility], [place] [address] [place], [place] [address]      Radiology Studies: DG Abd 1 View  Result Date: 12/28/2020 CLINICAL DATA:  Follow-up metallic foreign body EXAM: ABDOMEN - 1 VIEW COMPARISON:  Multiple films dating back to 12/22/2020 FINDINGS: Scattered large and small bowel gas is noted. Radiopaque foreign body is noted within the rectum similar to that seen on the prior exam. Multiple prostate brachytherapy seeds are noted. Degenerative changes of lumbar spine are seen. IMPRESSION: Foreign body within the rectum stable in appearance from the previous day. Electronically Signed   By: Inez Catalina M.D.   On: 12/28/2020 13:35     LOS: 8 days   Antonieta Pert, MD [facility]  12/29/2020, 11:37 AM

## 2020-12-30 ENCOUNTER — Inpatient Hospital Stay (HOSPITAL_COMMUNITY): Payer: Medicare Other

## 2020-12-30 DIAGNOSIS — S2243XA Multiple fractures of ribs, bilateral, initial encounter for closed fracture: Secondary | ICD-10-CM | POA: Diagnosis not present

## 2020-12-30 LAB — CBC
HCT: 29.1 % — ABNORMAL LOW (ref 39.0–52.0)
Hemoglobin: 9.5 g/dL — ABNORMAL LOW (ref 13.0–17.0)
MCH: 30.7 pg (ref 26.0–34.0)
MCHC: 32.6 g/dL (ref 30.0–36.0)
MCV: 94.2 fL (ref 80.0–100.0)
Platelets: 138 10*3/uL — ABNORMAL LOW (ref 150–400)
RBC: 3.09 MIL/uL — ABNORMAL LOW (ref 4.22–5.81)
RDW: 16.1 % — ABNORMAL HIGH (ref 11.5–15.5)
WBC: 8.5 10*3/uL (ref 4.0–10.5)
nRBC: 0 % (ref 0.0–0.2)

## 2020-12-30 LAB — BASIC METABOLIC PANEL
Anion gap: 11 (ref 5–15)
BUN: 66 mg/dL — ABNORMAL HIGH (ref 8–23)
CO2: 25 mmol/L (ref 22–32)
Calcium: 9.9 mg/dL (ref 8.9–10.3)
Chloride: 100 mmol/L (ref 98–111)
Creatinine, Ser: 3.85 mg/dL — ABNORMAL HIGH (ref 0.61–1.24)
GFR, Estimated: 15 mL/min — ABNORMAL LOW (ref >60)
Glucose, Bld: 106 mg/dL — ABNORMAL HIGH (ref 70–99)
Potassium: 4.2 mmol/L (ref 3.5–5.1)
Sodium: 136 mmol/L (ref 135–145)

## 2020-12-30 IMAGING — DX DG SHOULDER 2+V*R*
4 series · 4 of 4 positions shown · non-contrast
Comparison: None.

CLINICAL DATA: 87-year-old male with fall

EXAM:
RIGHT SHOULDER - 2+ VIEW

[shoulder axial]
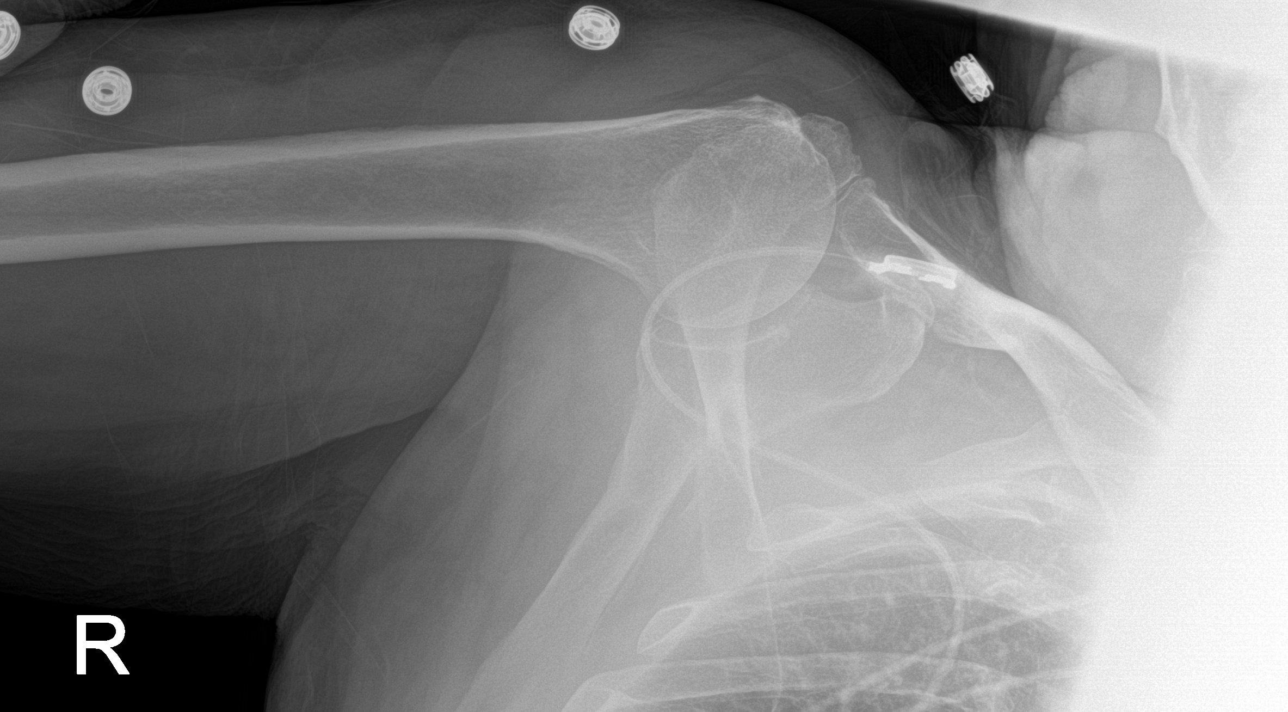

[shoulder ap]
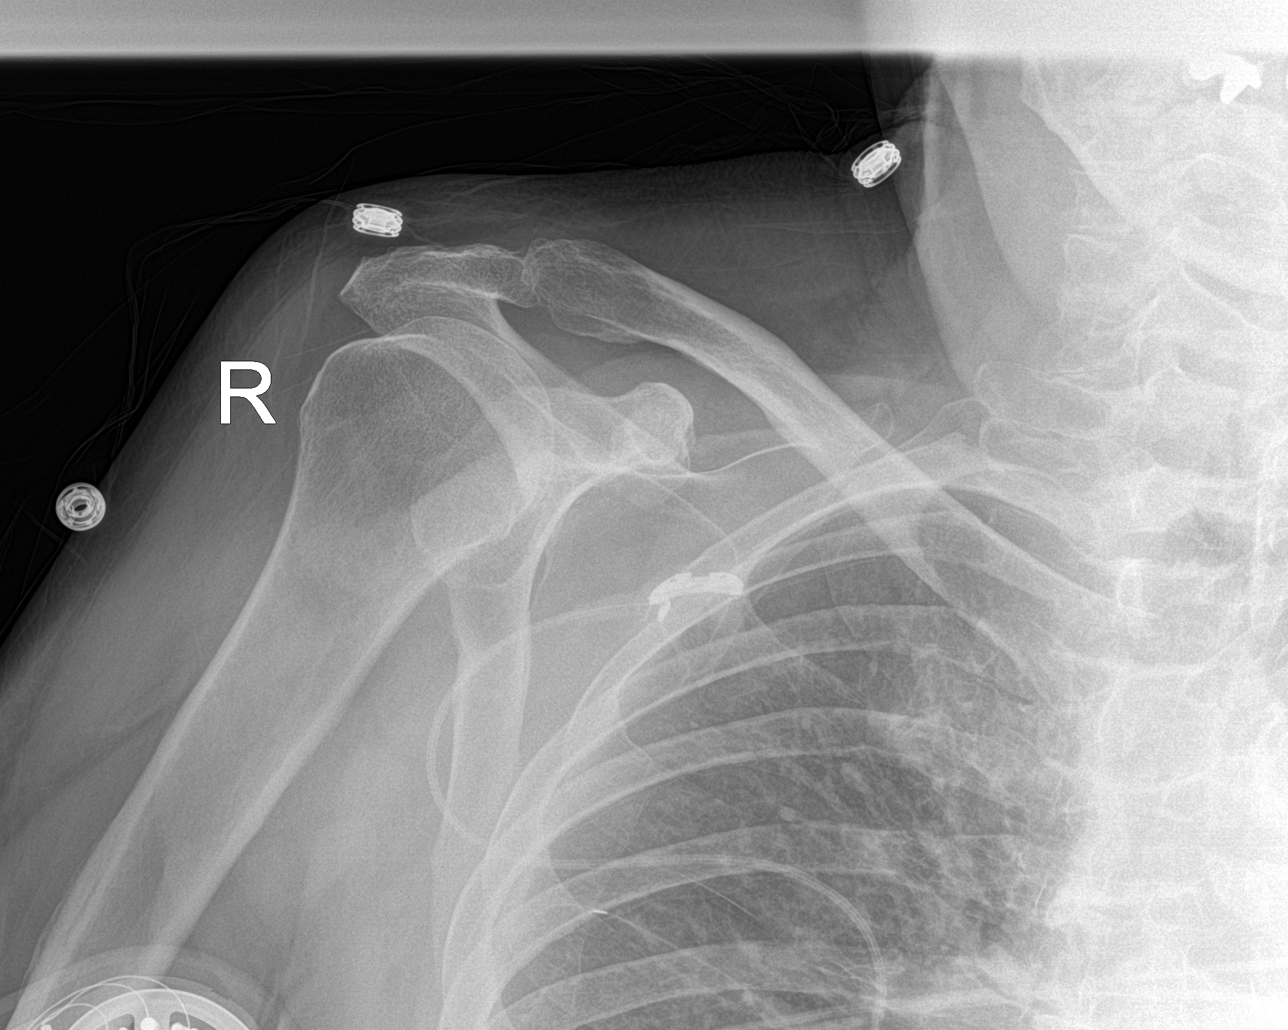

[shoulder obl (1 of 2)]
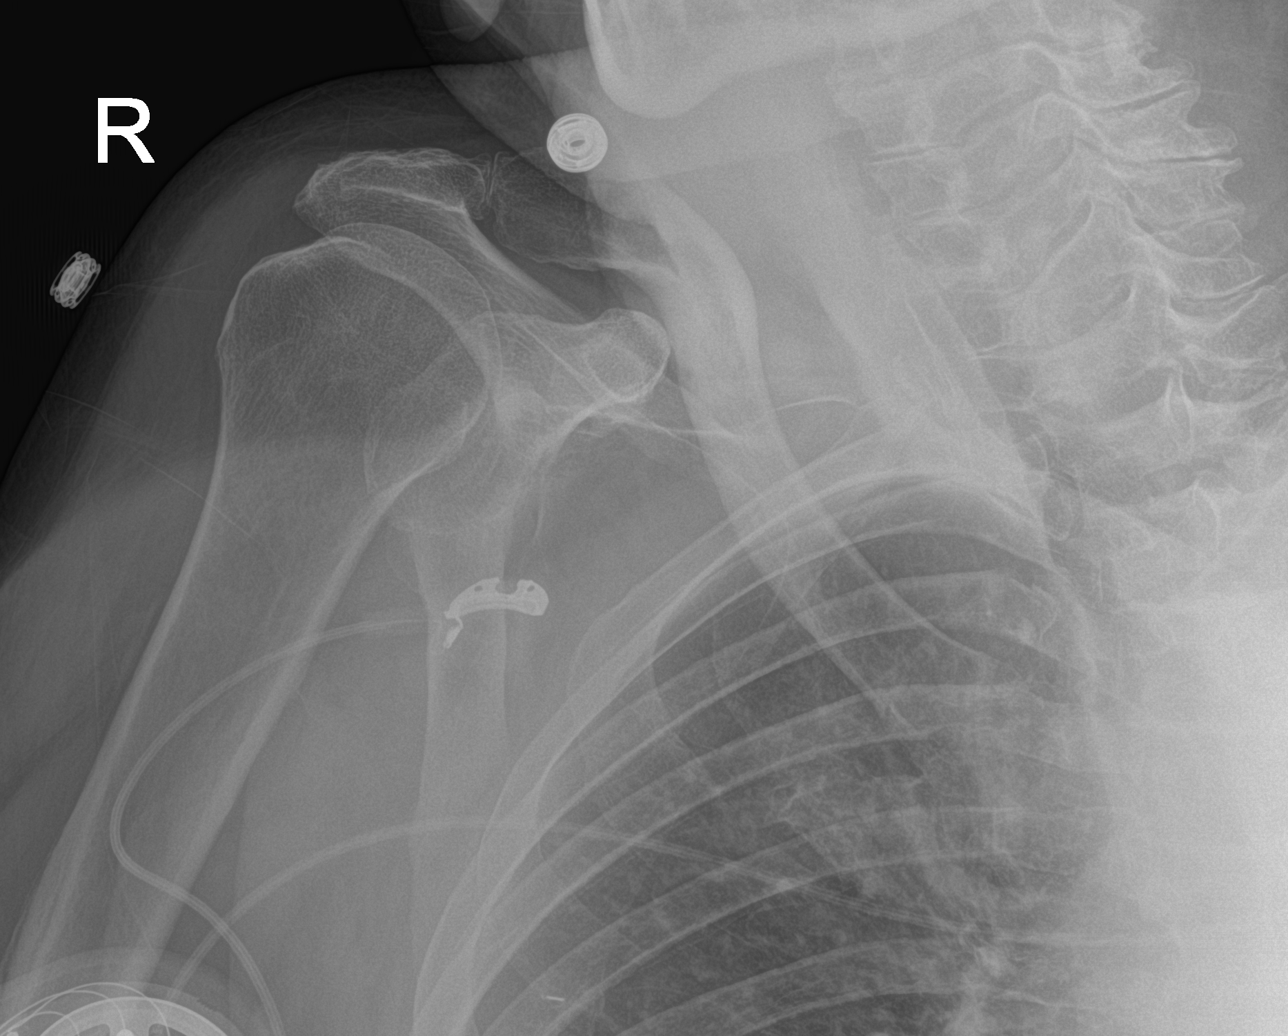

[shoulder obl (2 of 2)]
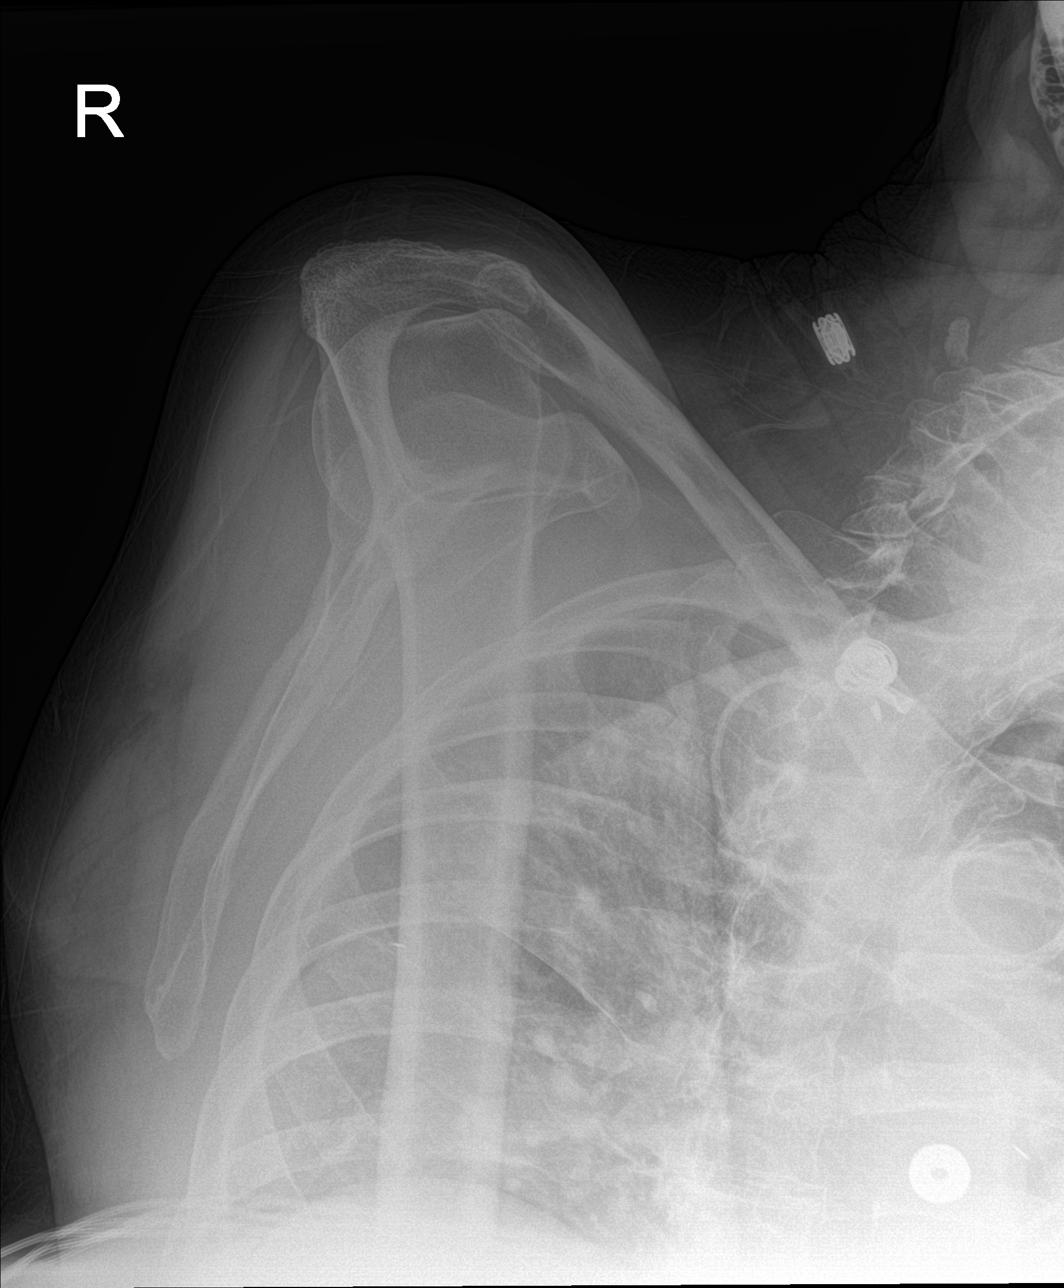

[4 of 4 positions shown; findings below may reference images not displayed]

FINDINGS: Glenohumeral joint is congruent. Degenerative changes at the
acromioclavicular joint. No acute displaced fracture. Unremarkable
appearance of the visualized thorax. No radiopaque foreign body. No
focal soft tissue swelling
IMPRESSION: Negative for acute bony abnormality.

Degenerative changes at the AC joint

## 2020-12-30 IMAGING — CT CT L SPINE W/O CM
3 of 4 series · 12 of 33 positions shown, 14 images · non-contrast
Comparison: Lumbar spine CT scan [DATE]

CLINICAL DATA: History of multiple falls.  Low back pain.

EXAM:
CT LUMBAR SPINE WITHOUT CONTRAST
TECHNIQUE: Multidetector CT imaging of the lumbar spine was performed without
intravenous contrast administration. Multiplanar CT image
reconstructions were also generated.

[Series 5: l spine 2.0 st · axial · 0.36mm/px · z∈[-758,-538]mm · 4 of 156 slices shown, 5 images]
[im 23/156  soft-tissue]
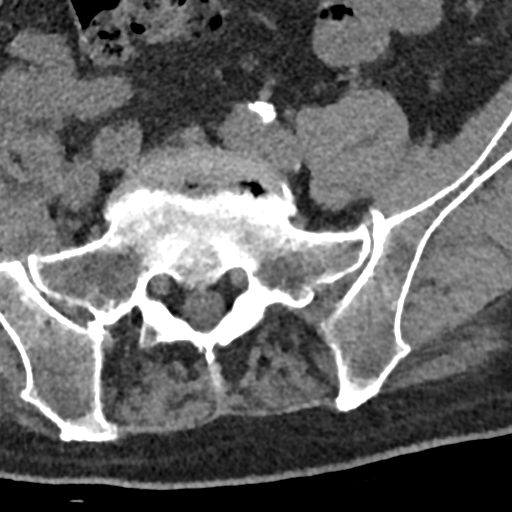
[im 23/156  bone]
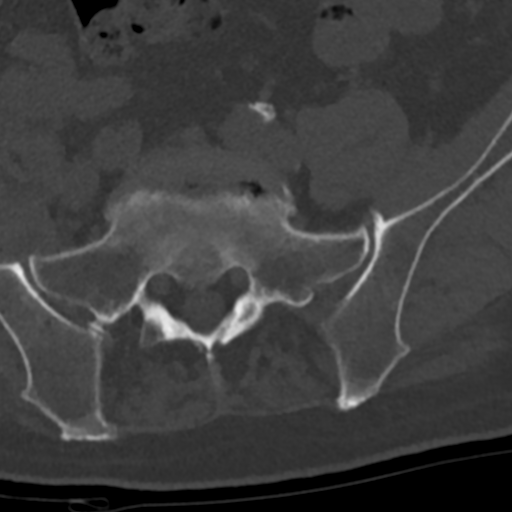
[im 67/156  bone]
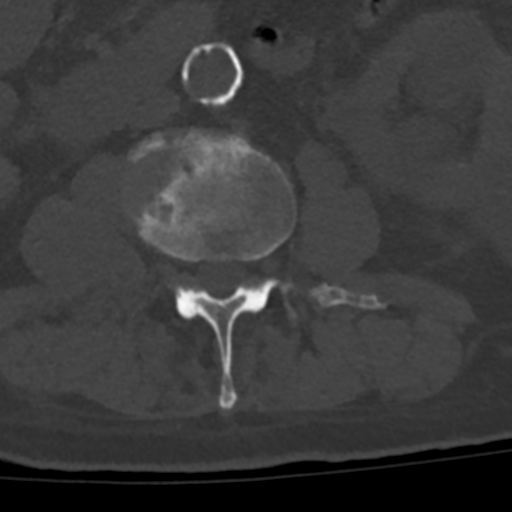
[im 89/156  bone]
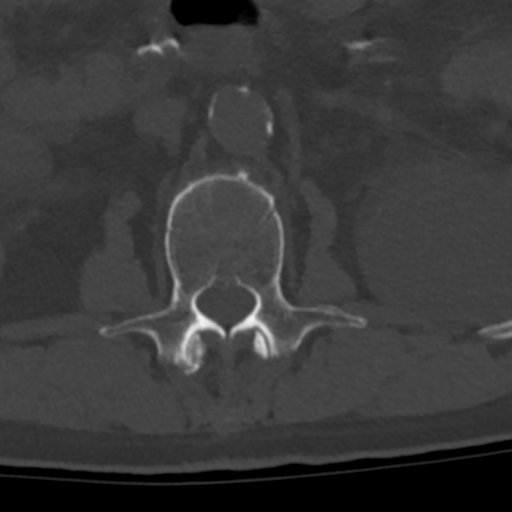
[im 133/156  bone]
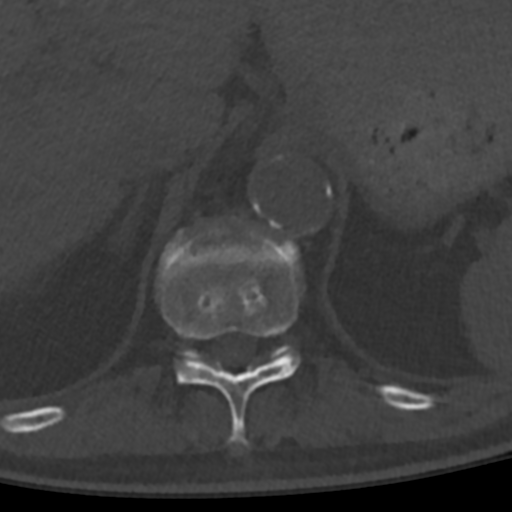

[Series 8: coronal st · coronal · 0.46mm/px · 3 of 83 slices shown]
[im 17/83  bone]
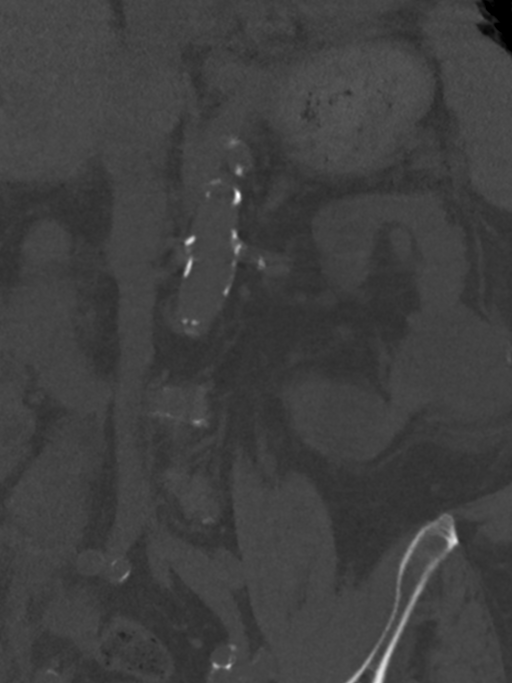
[im 33/83  bone]
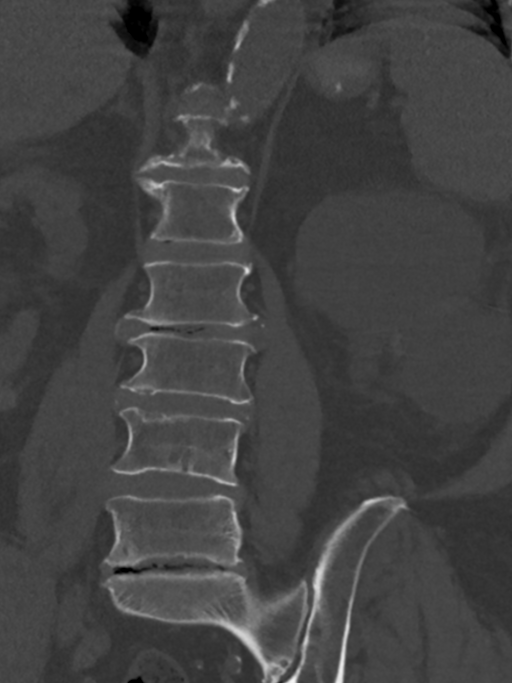
[im 50/83  bone]
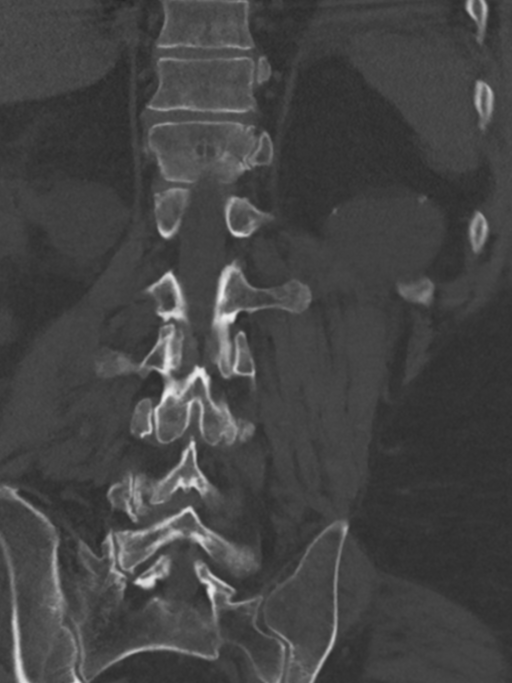

[Series 9: sagittal st · sagittal · 0.39mm/px · 5 of 70 slices shown, 6 images]
[im 24/70  bone]
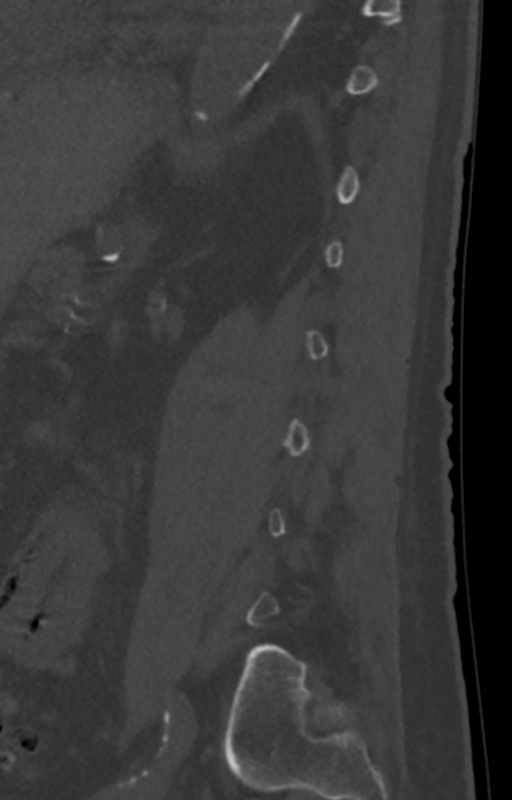
[im 29/70  bone]
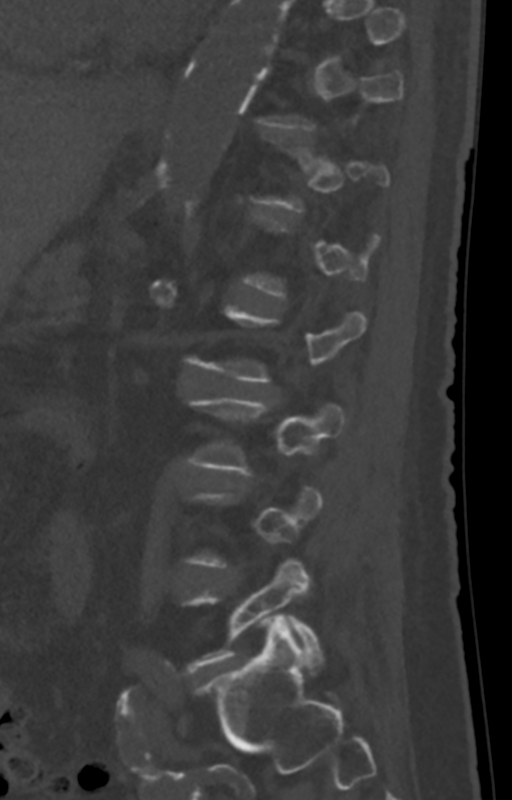
[im 35/70  soft-tissue]
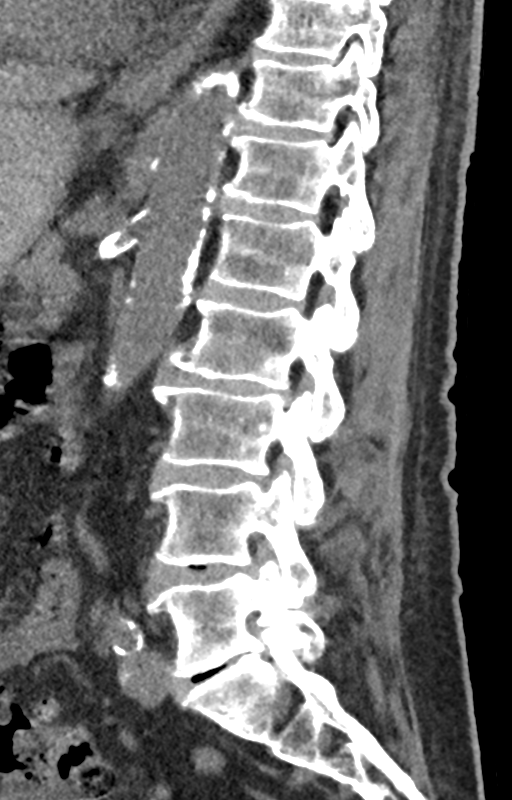
[im 35/70  bone]
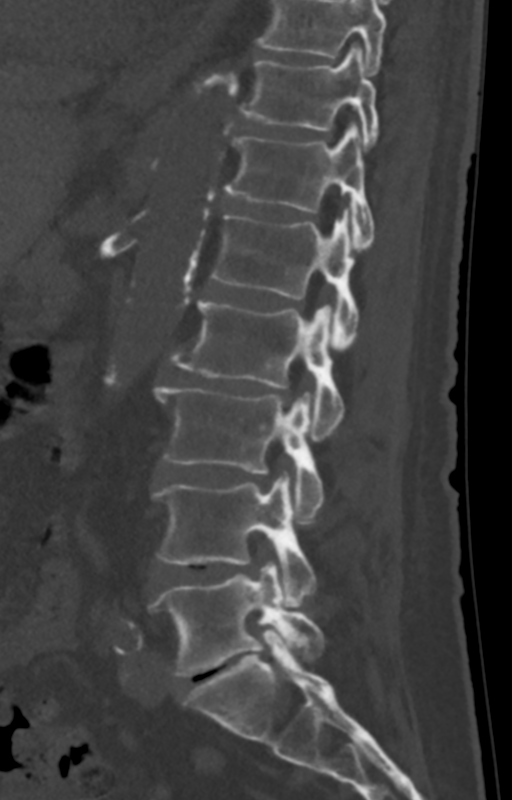
[im 41/70  bone]
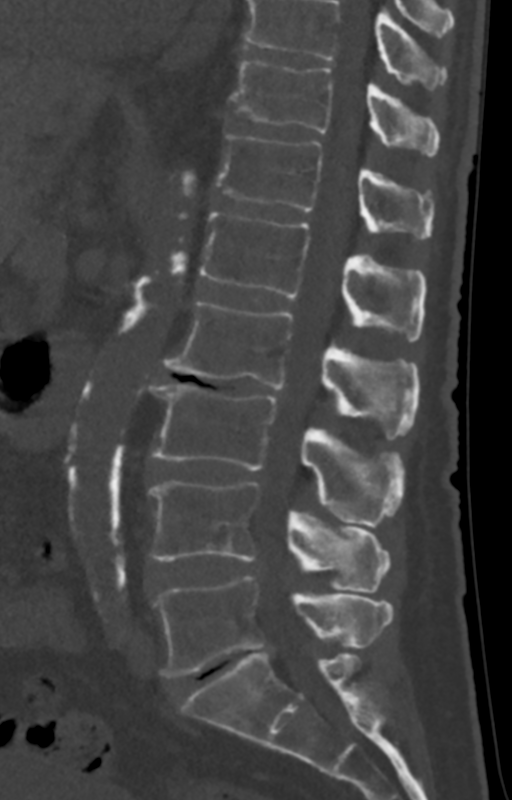
[im 47/70  bone]
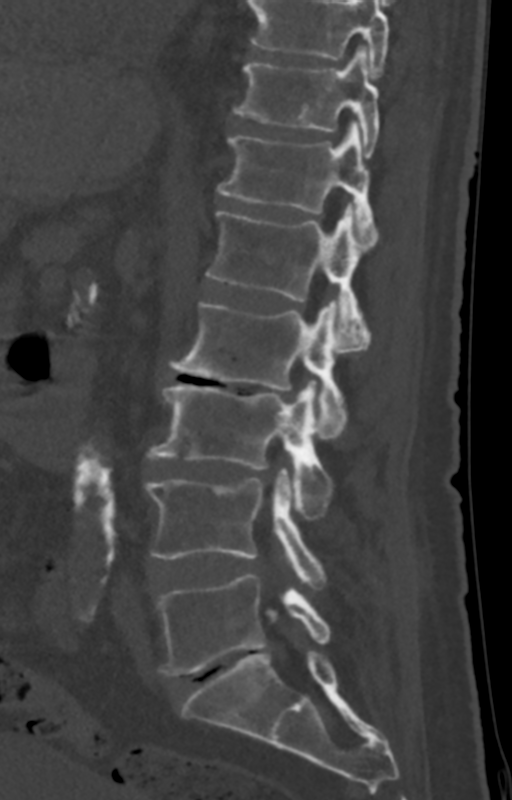

[12 of 33 positions shown; findings below may reference images not displayed]

FINDINGS: Segmentation: There are five lumbar type vertebral bodies. The last
full intervertebral disc space is labeled L5-S1.

Alignment: Normal

Vertebrae: Age related osteoporosis. No acute lumbar fracture. No
worrisome bone lesions. The pedicles and facets are intact. No pars
defects. Remote healed left L2 and L3 transverse process fractures.

Paraspinal and other soft tissues: No significant paraspinal or
retroperitoneal findings. Advanced atherosclerotic calcifications
involving the aorta and branch vessels. There are numerous bilateral
renal cysts. No retroperitoneal mass.

Disc levels: Multilevel disc disease and facet disease with bulging
discs but no large disc protrusions, significant spinal or foraminal
stenosis. Diffuse bulging annulus and mild ligamentum flavum
thickening at L4-5 with mild spinal and bilateral lateral recess
stenosis.
IMPRESSION: 1. Normal alignment of the lumbar vertebral bodies and no acute
fracture.
2. Remote healed left L2 and L3 transverse process fractures.
3. Multilevel disc disease and facet disease but no large disc
protrusions, significant spinal or foraminal stenosis.
4. Mild spinal and bilateral lateral recess stenosis at L4-5.
5. Advanced atherosclerotic calcifications involving the aorta and
branch vessels.
6. Aortic atherosclerosis.

Aortic Atherosclerosis ([PI]-[PI]).

## 2020-12-30 IMAGING — DX DG SHOULDER 2+V*L*
4 series · 4 of 4 positions shown · non-contrast
Comparison: None.

CLINICAL DATA: 87-year-old male with fall and pain

EXAM:
LEFT SHOULDER - 2+ VIEW

[shoulder axial]
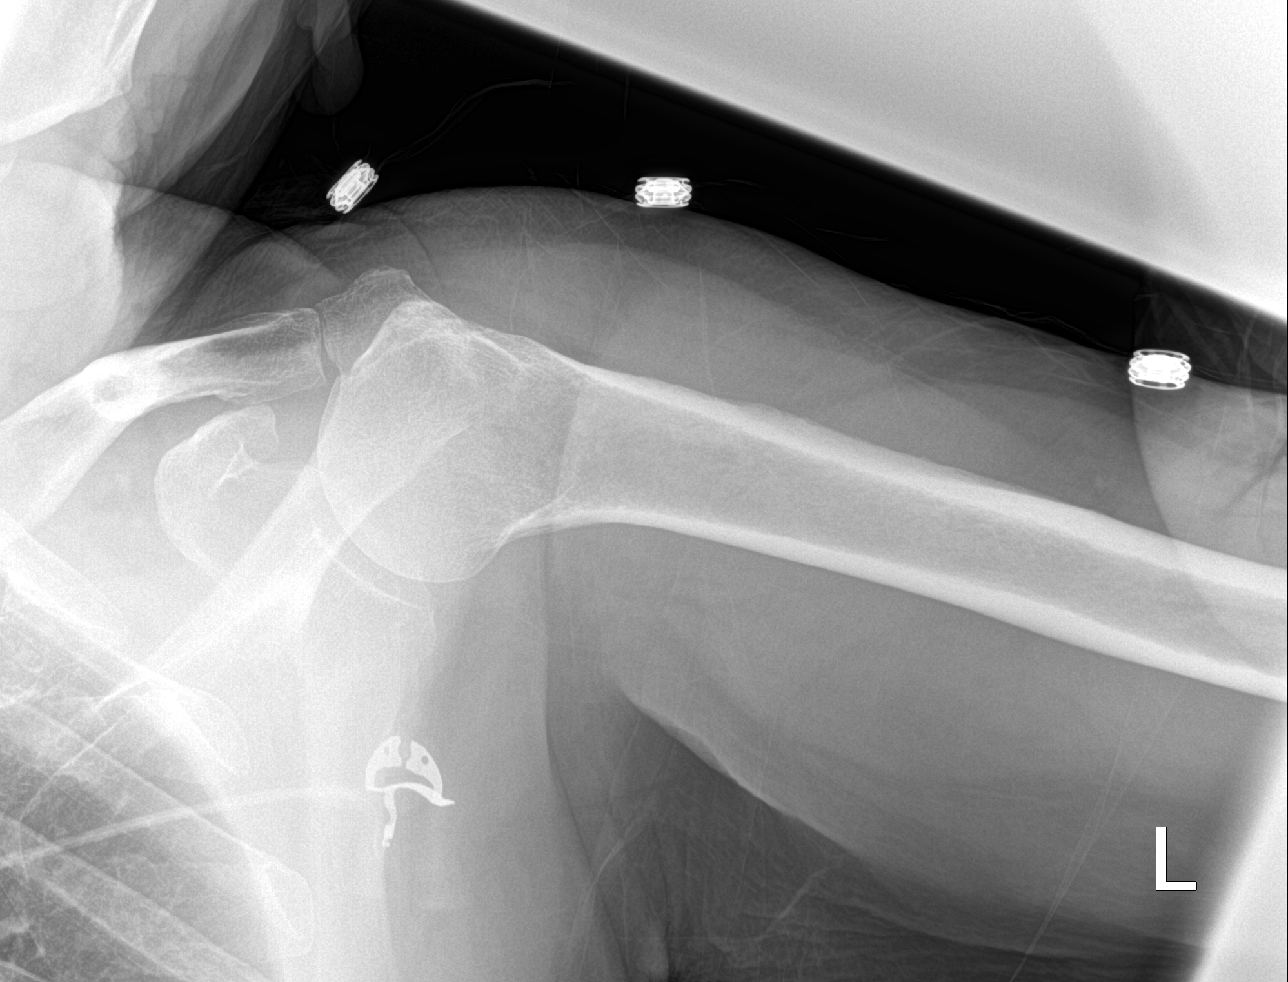

[shoulder ap]
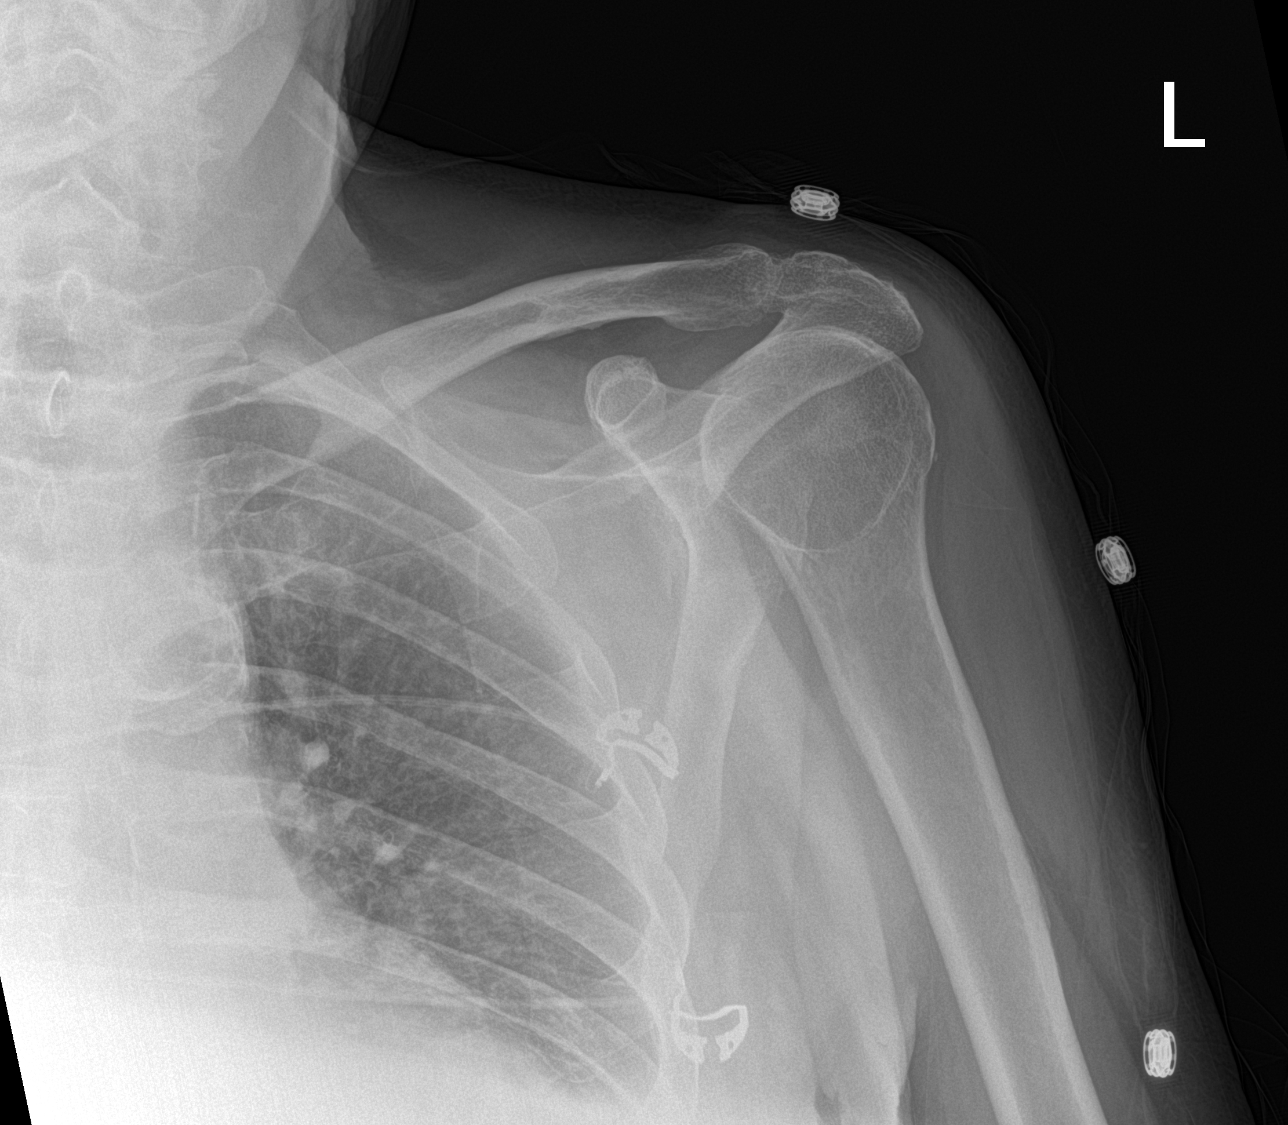

[shoulder obl (1 of 2)]
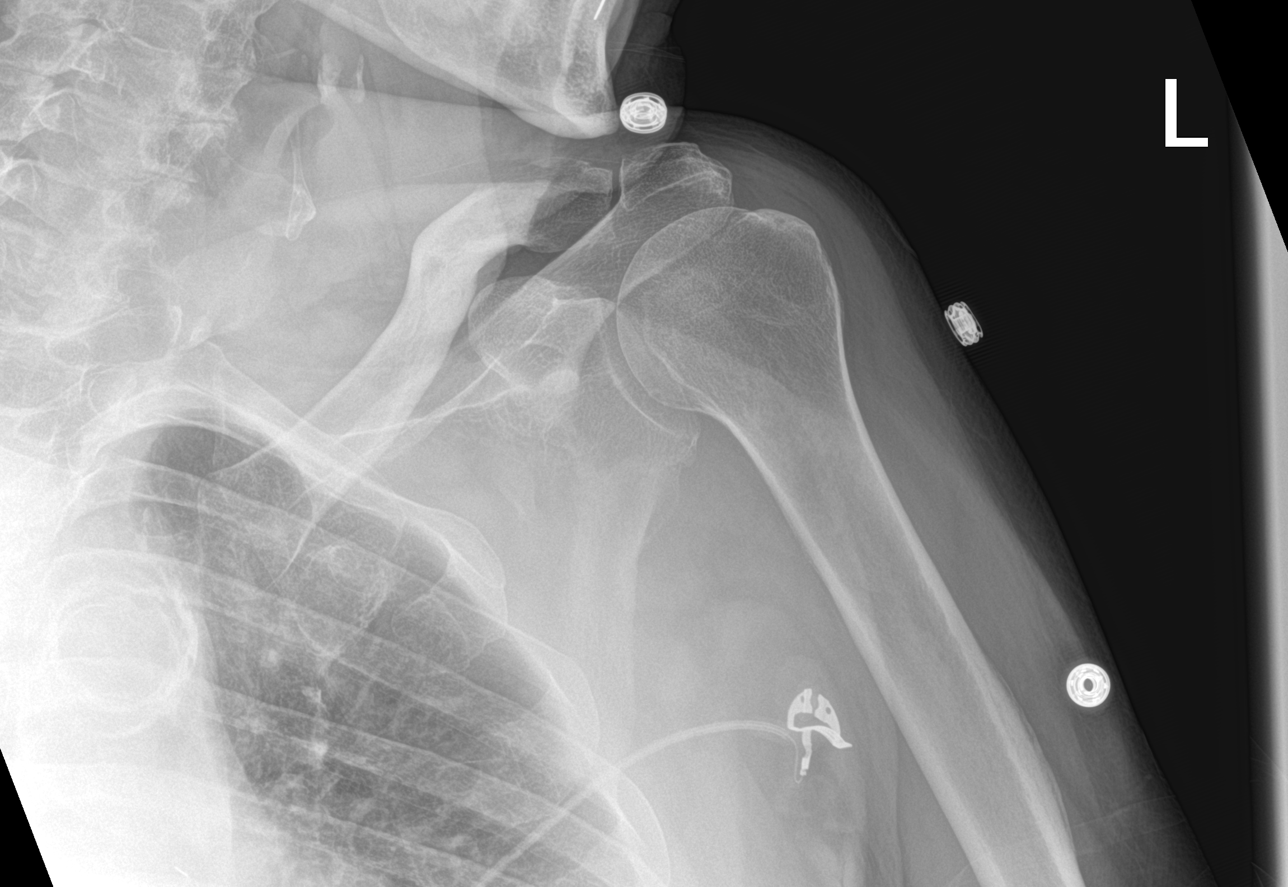

[shoulder obl (2 of 2)]
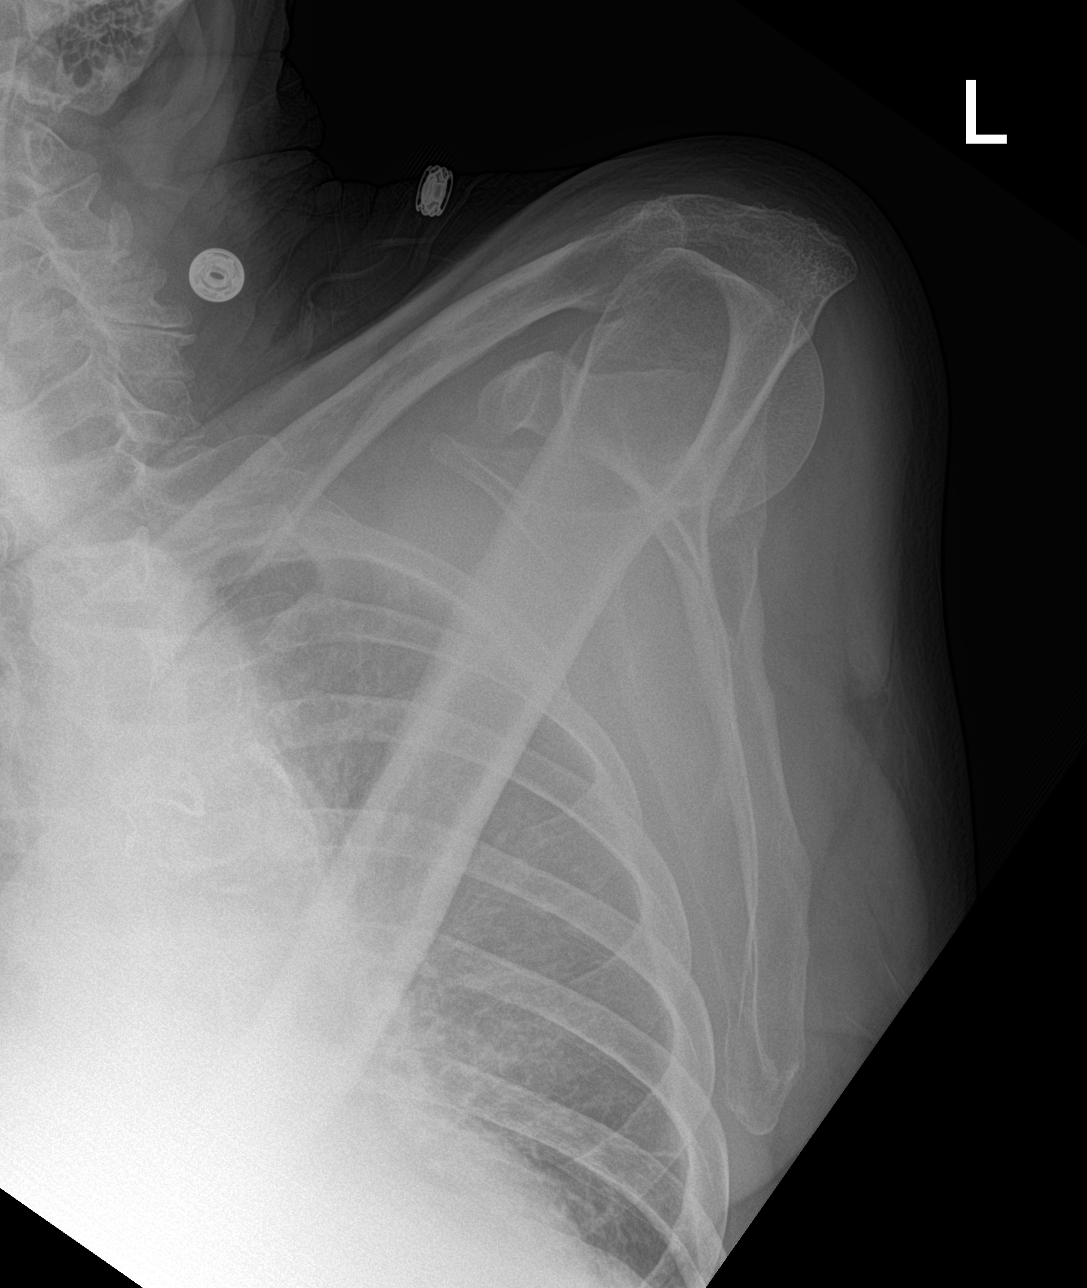

[4 of 4 positions shown; findings below may reference images not displayed]

FINDINGS: Glenohumeral joint is congruent. Degenerative changes of the
acromioclavicular joint. No acute displaced fracture. No radiopaque
foreign body. No focal soft tissue swelling. Visualized thorax
unremarkable.
IMPRESSION: Negative for acute bony abnormality. Degenerative changes of the AC
joint.

## 2020-12-30 IMAGING — CT CT CERVICAL SPINE W/O CM
4 of 6 series · 12 of 35 positions shown, 14 images · non-contrast
Comparison: Cervical spine CT [DATE].

CLINICAL DATA: Provided history: Neck pain, chronic, no prior
imaging. Multiple falls, posterior neck and low back pain.

EXAM:
CT CERVICAL SPINE WITHOUT CONTRAST
TECHNIQUE: Multidetector CT imaging of the cervical spine was performed without
intravenous contrast. Multiplanar CT image reconstructions were also
generated.

[Series 7: c_spine 2.0 st · axial · 0.33mm/px · z∈[-265,-199]mm · 2 of 99 slices shown, 3 images]
[im 33/99  soft-tissue]
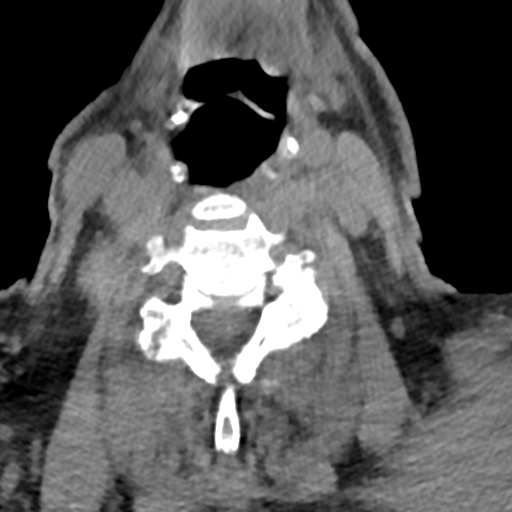
[im 33/99  bone]
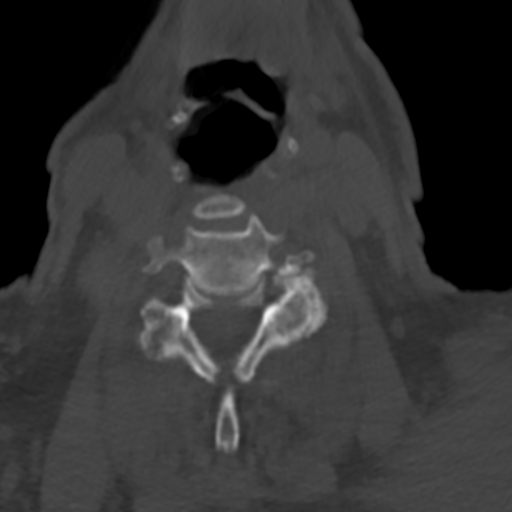
[im 66/99  bone]
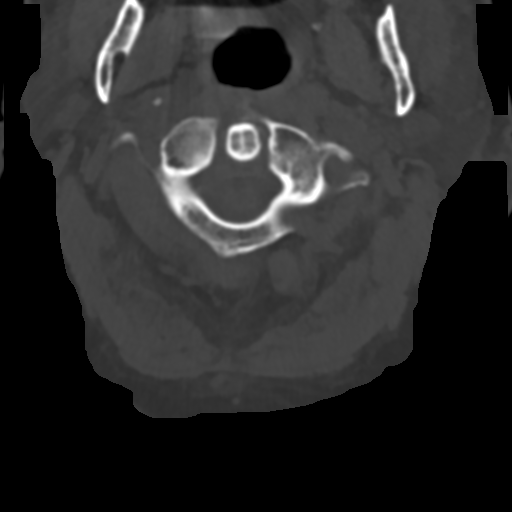

[Series 8: coronal bone · coronal · 0.34mm/px · 3 of 61 slices shown]
[im 17/61  bone]
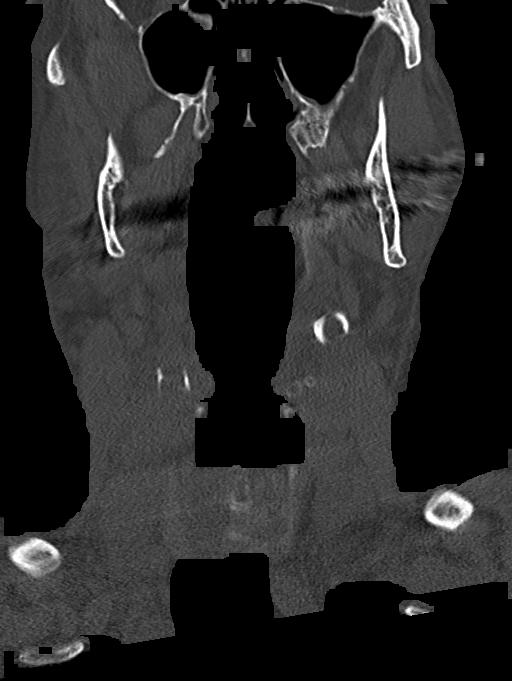
[im 26/61  bone]
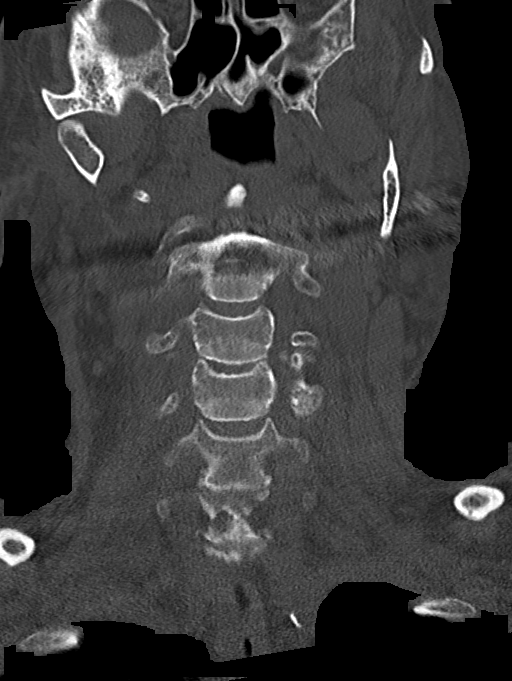
[im 35/61  bone]
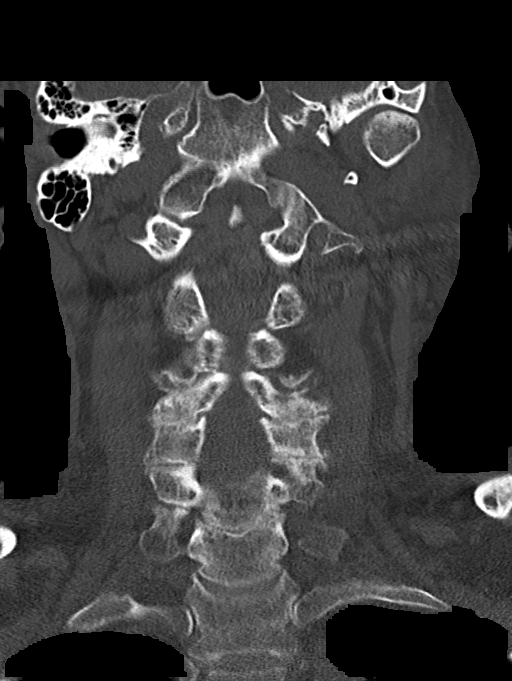

[Series 9: sagittal bone · sagittal · 0.28mm/px · 5 of 61 slices shown, 6 images]
[im 21/61  bone]
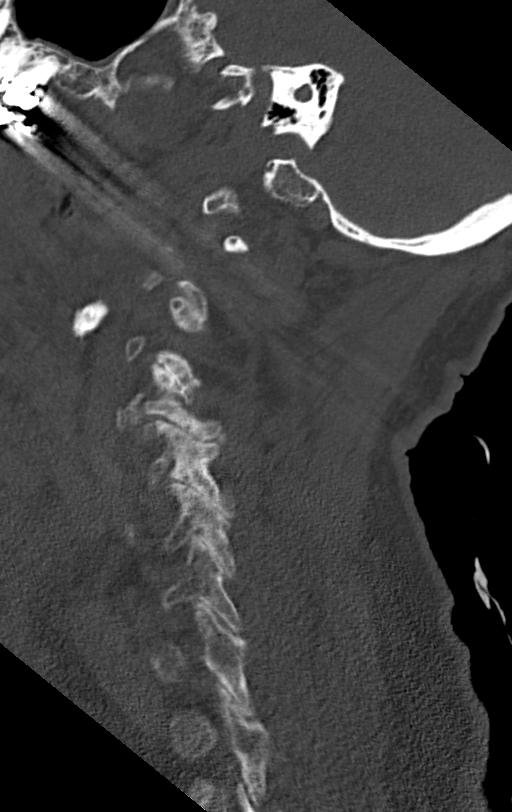
[im 26/61  bone]
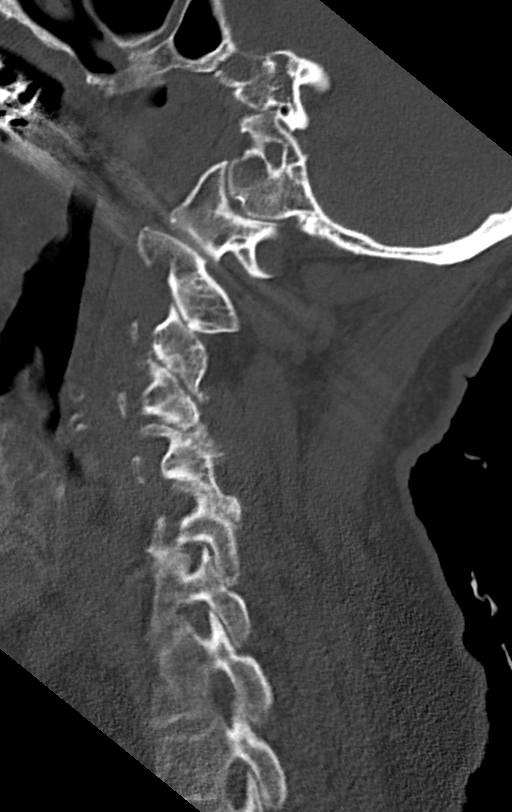
[im 31/61  soft-tissue]
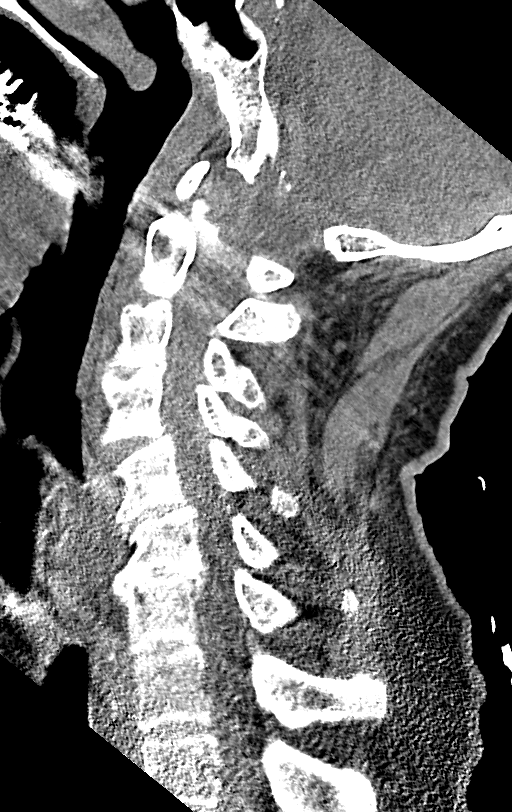
[im 31/61  bone]
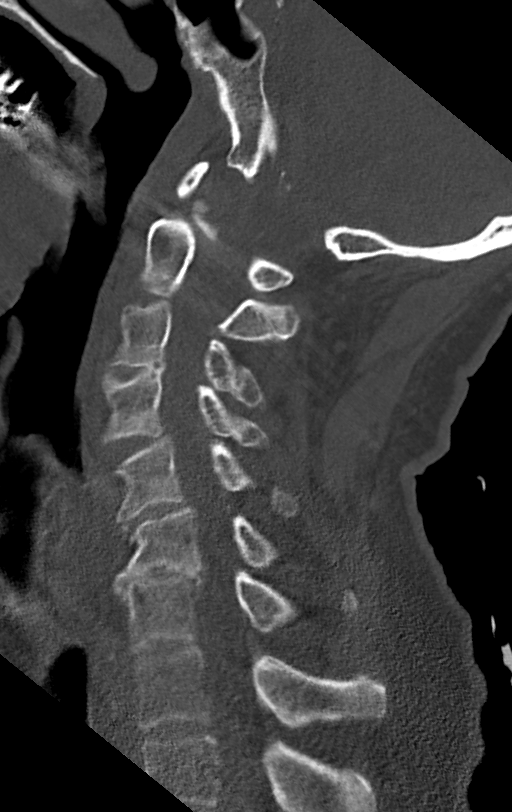
[im 36/61  bone]
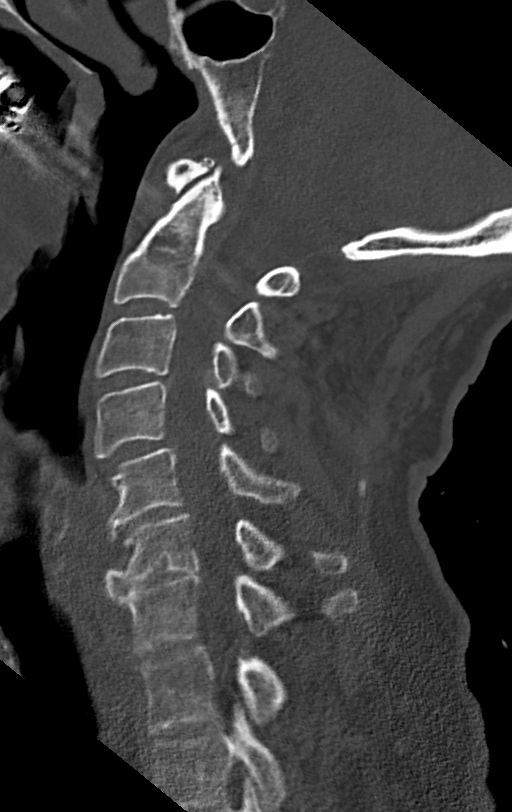
[im 41/61  bone]
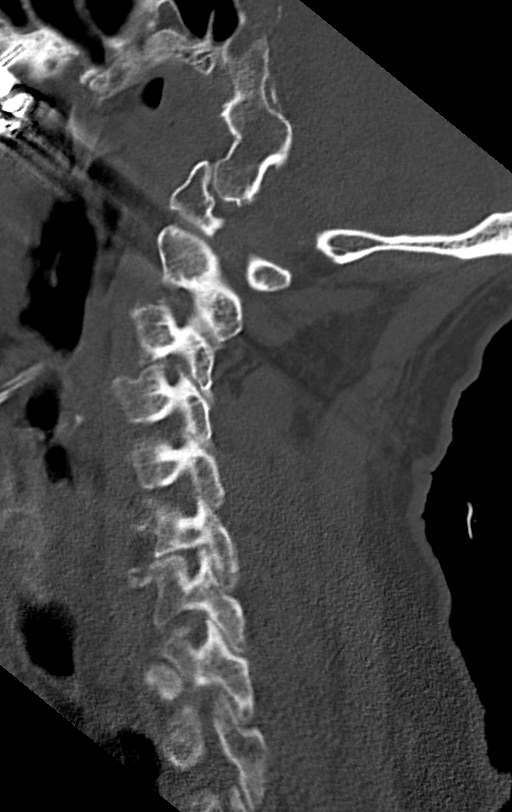

[Series 10: orthogonal bone · oblique · 0.21mm/px · 2 of 99 slices shown]
[im 33/99  bone]
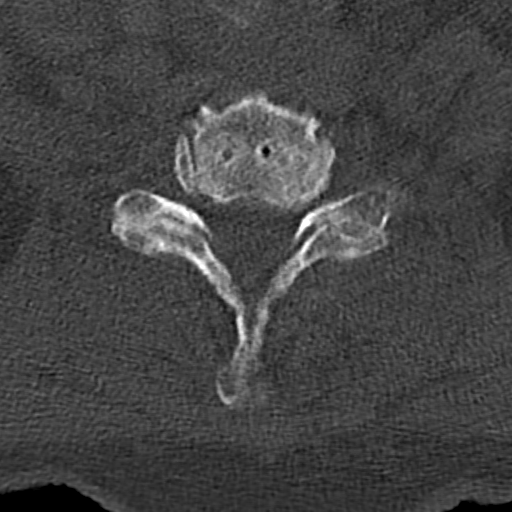
[im 66/99  bone]
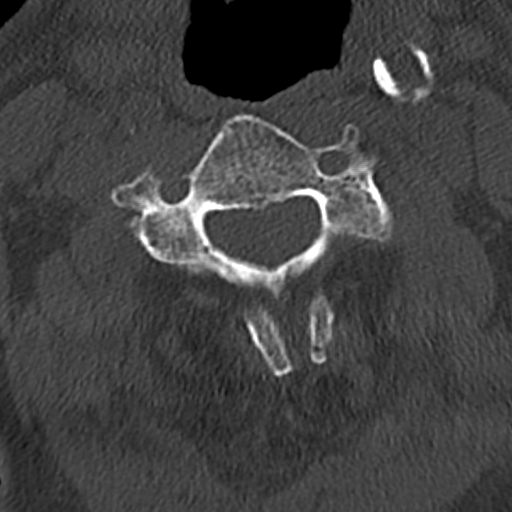

[12 of 35 positions shown; findings below may reference images not displayed]

FINDINGS: Alignment: 3 mm C4-C5 grade 1 anterolisthesis, unchanged.

Skull base and vertebrae: The basion-dental and atlanto-dental
intervals are maintained.No evidence of acute fracture to the
cervical spine.

Soft tissues and spinal canal: No prevertebral fluid or swelling. No
visible canal hematoma.

Disc levels:

Multilevel disc space narrowing. Most notably, there is moderate to
moderately advanced disc space narrowing at C5-C6 and C6-C7.

C2-C3: Small central disc protrusion. Facet arthrosis. Mild relative
spinal canal narrowing. No significant bony neural foraminal
narrowing.

C3-C4: Broad-based central disc protrusion uncovertebral
hypertrophy. Facet arthrosis. Ligamentum flavum hypertrophy.
Suspected moderate spinal canal stenosis. Bilateral bony neural
foraminal narrowing (severe right, moderate left).

C4-C5: Grade 1 anterolisthesis. Disc uncovering with slight disc
bulge. Facet arthrosis. No appreciable significant spinal canal
stenosis. Bilateral bony neural foraminal narrowing (moderate left).

C5-C6: Disc bulge with mild endplate spurring. Mild spinal canal
stenosis. Mild left bony neural foraminal narrowing.

C6-C7: Disc bulge with endplate spurring and bilateral disc
osteophyte ridge/uncinate hypertrophy. Suspected mild/moderate
spinal canal stenosis. Bilateral bony neural foraminal narrowing
(moderate right, moderate/severe left).

C7-T1: No significant disc herniation or spinal canal stenosis.
Endplate spurring on the right contributes to moderate right neural
foraminal narrowing.

Upper chest: No consolidation within the imaged lung apices. No
visible pneumothorax.
IMPRESSION: No evidence of acute fracture to the cervical spine.

3 mm C4-C5 grade 1 anterolisthesis, unchanged.

Cervical spondylosis with multilevel spinal canal and neural
foraminal narrowing. Most notably, there is suspected moderate
spinal canal stenosis at C3-C4 and suspected mild/moderate spinal
canal stenosis at C6-C7. Neural foraminal narrowing is greatest
bilaterally at C3-C4 (severe right, moderate left), bilaterally at
C4-C5 (moderate) and bilaterally at C6-C7 (moderate right,
moderate/severe left).

## 2020-12-30 MED ORDER — SALINE SPRAY 0.65 % NA SOLN
1.0000 | NASAL | Status: DC | PRN
  Filled 2020-12-30: qty 44

## 2020-12-30 MED ORDER — FUROSEMIDE 20 MG PO TABS
20.0000 mg | ORAL_TABLET | Freq: Every day | ORAL | Status: DC
  Administered 2020-12-31 – 2021-01-01 (×2): 20 mg via ORAL
  Filled 2020-12-30 (×2): qty 1

## 2020-12-30 NOTE — Care Management Important Message (Signed)
Important Message  Patient Details  Name: [name] [name] MRN: [id number] Date of Birth: [birth year]   Medicare Important Message Given:  Yes - Important Message mailed due to current National Emergency  Verbal consent obtained due to current National Emergency  Relationship to patient: Self Contact Name: Raedyn Call Date: 12/30/20  Time: 1243 Phone: [phone] Outcome: No Answer/Busy Important Message mailed to: Patient address on file    Delorse Lek 12/30/2020, 12:43 PM

## 2020-12-30 NOTE — Progress Notes (Signed)
PROGRESS NOTE    [name] [name]  HAL:[id number] DOB: [birth year] DOA: 12/20/2020 PCP: Antony Contras, MD   Chief Complaint  Patient presents with  . Fall    Taking Eliquis  Brief Narrative: 35 old male with history of hypertension, A. fib on Eliquis, CAD history of PCI in 7353, chronic systolic CHF EF 29-9-[id number], GERD, chronic kidney disease stage III brought to the ED after fall at home.  He has had recent falls and while at the shower on 4/5 he fell.  Patient reported that he lost balance. Per report denies any loss of consciousness or trauma to his head. He has been ambulating with use of a walker.  Apparently patient has had severe back pain and sciatica for which he has been seen by physical therapy and has a scheduled appointment with orthopedics.  Due to the pain in his back he has not been sleeping well at night In the ED,he was afebrile,saturating well on room air,CT scan of the head and cervical spine showed no acute abnormality, but did note a partially visualized right-sided pleural effusion.  Labs significant for for hemoglobin 8.7,platelets 140, sodium 122,chloride 92,CO2 20,BUN 80, creatinine 4.45, calcium 10.8, AST 63, serum osmolarity 287, and TSH 2.577.  Urinalysis was positive for 30 mg/dL protein, but showed no significant signs of infection.  CT scan of the chest for bilateral nondisplaced rib fractures, a metallic foreign body in the gastric body measuring 2.1 cm possibly hearing aid, possible duodenitis/peptic ulcer disease, cardiomegaly with signs of fluid overload with pulmonary edema, and a 4.2 cm ascending aortic aneurysm.Patient was given normal saline IVFs at 75 ml/hr, Patient was admitted and nephrology, gastroenterology and cardiology were consulted. Patient became lethargic after getting morphine in the ED. Patient was followed by GI nephrology cardiology. Underwent EGD that showed stomach ulcer no foreign body-GI advised to hold off anticoagulation until 4/13. xray  showed foreign body that has moved below his stoma placed on laxatives and foreign body is further moving currentlly  in rectum 4/12 Had fainting/vasovagal episode while in BM on 4/11 meds were adjusted. Again was orthostatic blood pressure in 70s on standing 4/13- givne iv boluses and meds adjusted Patient did very well with physical therapy his bedside chair not orthostatic on 4/14, also had a bowel movement and passed the foreign body  Subjective:  Resting comfortably.  Again has chronic back pain and has shoulder pain Daughter at the bedside.  Patient is hard of hearing.  He is on room air.  Not dizzy with physical therapy yesterday   Assessment & Plan:  Multiple falls at home Chronic low back pain: B/l shoulder pain Chest wall pain from rib fracture He was scheduled to see [facility] 4/18 as outpatient, has had at least 4  Falls at home  likely multifactorial in setting of hyponatremia and fluid overload chronic low back pain and physical deconditioning.  Remains very deconditioned and weak frail.  PT OT working with him, recommended skilled nursing facility.  Continue pain control with lidocaine patch Tylenol- he is sensitive to narcotics patient and family refusing even the tramadol.  He passed foreign body/hearing aid, will go ahead and order MRI of lumbar spine for now.  Ordered x-ray of the bilateral shoulders may need imaging c spine.  Hypertension Vasovagal episode/syncope while having BM 4/11 am again orthostatic hypotension 4/13-s/p 500 mL bolus normal saline.   Cont lasix po, coreg also cut down, and discontinued amlodipine, continue TED hose.  It seems to have resolved.  Blood pressure appears stable. He was on coreg 25 mg before.  Foreign body-hearing aid in  GI tract: [employer] 20 stool 4/14.   Cont prn miraalx.  Duodenitis/PUD/nonbleeding gastric ulcer in  EGD 4/7.GI advised to hold anticoagulation/Eliquis for 5 days and resumed Eliquis 4/13 pm.Continue PPI twice daily.   H. pylori serology is negative.  Multiple fractures of ribs,bilateral,due to fall: Continue lidocaine, Tylenol.  Patient and family reluctant with any kind of opiates as he is even sensitive to Tylenol.   Acute encephalopathy following IV morphine in the ED.Resolved.Avoid narcotics.  Hypervolemic hyponatremia:Resolved with diuretics and fluid restriction 1.2 L.  Sodium stable at 136.  Nephrology signed off.   AKI on CKD 3b-PCP records shows baseline creatinine 4/21 was 3.8. No value since then, possibly could have progressed since and holding steady in 4.3 range-and has been diuresed, now on p.o. Lasix 40 mg daily- had good uop 2900 ml- we will cot down to 20 mg daily ( if UOP slows down can increase to  40 mg) as he did need fluid boluses for orthostasis.  Creatinine has now improved 3.8 which is baseline at ckd stage IIIb. His CT shows bilateral complex renal cyst.  Nephrology has signed off. He will  need to follow-up with nephrology outpatient and they are planning to see Dr. Hollie Salk upon discharge- info provided.  Total net -12.9 L as below. Recent Labs  Lab 12/26/20 0326 12/27/20 0611 12/28/20 0639 12/29/20 0307 12/30/20 0258  BUN 87* 82* 74* 70* 66*  CREATININE 4.30* 4.11* 4.00* 4.13* 3.85*  Net IO Since Admission: -12,921 mL [12/30/20 0948]  Intake/Output Summary (Last 24 hours) at 12/30/2020 0948 Last data filed at 12/30/2020 0923 Gross per 24 hour  Intake 940 ml  Output 2900 ml  Net -1960 ml   Iron deficiency anemia:guaiac was noted to be negative.  Hb stable 8- 9 g.   Recent Labs  Lab 12/26/20 0326 12/27/20 0610 12/28/20 0639 12/29/20 0307 12/30/20 0258  HGB 8.4* 8.9* 9.2* 9.1* 9.5*  HCT 24.6* 26.3* 27.9* 28.0* 29.1*   Leukocytosis, unclear etiology could be reactive. Improved to 8.5k  CAD S/P angioplasty 2013:trop neg 14>13.continue his aspirin, beta-blocker and statin.  Acute on chronic CHF exacerbation w/ preserved EF:BNP elevated and was hypervolemic on exam. Echo  shows normal LV function suspect CHF with preserved EF.  Diuresed with IV Lasix.  Significantly negative balance 12.9 L.  Lasix dose adjusted to 20 mg today.  Continue Coreg low dsoe.Cardiology has signed off. Appreciate cardiology nephrology input Net IO Since Admission: -12,921 mL [12/30/20 0948]  Filed Weights   12/20/20 2325 12/24/20 0357 12/25/20 2026  Weight: 115 kg 95.4 kg 97.6 kg   Permanent A. fib on Eliquis now with multiple falls rate controlled on Coreg. DOAC is held for EGD and  Now 2/2 stmomach ulcer- GI advised to hold for 5 days and resumed Eliquis appropriate renal dose 2.5 mg .paient is here for temporarily from [place] and is followed by cardiologist in [place].  Thrombocytopenia: rat 138k, monitor. Recent Labs  Lab 12/26/20 0326 12/27/20 0610 12/28/20 0639 12/29/20 0307 12/30/20 0258  PLT 153 159 157 146* 138*   Hyperlipidemia Cont statin.   Morbid Obesity BMI 34: Patient will benefit with weight loss and PCP follow-up.  Diet Order            DIET SOFT Room service appropriate? Yes; Fluid consistency: Thin; Fluid restriction: 1200 mL Fluid  Diet effective now  Patient's Body mass index is 29.18 kg/m.  DVT prophylaxis: SCD. eliquis Code Status:   Code Status: Full Code  Family Communication: plan of care discussed with patient and his daughter at the bedside.    Status is: Inpatient Remains inpatient appropriate because:IV treatments appropriate due to intensity of illness or inability to take PO and Inpatient level of care appropriate due to severity of illness  Dispo: The patient is from: Home              Anticipated d/c is to: SNF versus  HH-family wanted to talk with social worker about bed offers options               Patient currently is not medically stable to d/c.   Difficult to place patient No Unresulted Labs (From admission, onward)          Start     Ordered   12/30/20 0500  CBC  Daily,   R     Question:  Specimen  collection method  Answer:  Lab=Lab collect   12/29/20 1138   12/30/20 0981  Basic metabolic panel  Daily,   R     Question:  Specimen collection method  Answer:  Lab=Lab collect   12/29/20 1138        Medications reviewed:  Scheduled Meds: . allopurinol  100 mg Oral Daily  . apixaban  2.5 mg Oral BID  . aspirin EC  81 mg Oral Daily  . carvedilol  3.125 mg Oral BID WC  . furosemide  40 mg Oral Daily  . latanoprost  1 drop Both Eyes QHS  . lidocaine  1 patch Transdermal Q24H  . lidocaine  1 patch Transdermal Q24H  . magnesium citrate  1 Bottle Oral Once  . melatonin  3 mg Oral QHS  . pantoprazole  40 mg Oral BID AC  . polyethylene glycol  17 g Oral BID  . psyllium  1 packet Oral TID  . rosuvastatin  5 mg Oral Q M,W,F  . sodium chloride flush  3 mL Intravenous Q12H   Continuous Infusions: . sodium chloride      Consultants: Nephrology, cardiology GI   Procedures:see note EGD Medium-sized hiatal hernia. - Non-bleeding gastric ulcer with no stigmata of bleeding. - Gastritis. - Duodenitis. - No foreign body seen to the extent of our examination (D2). Impression: None Moderate Sedation: - Return patient to hospital ward for ongoing care. - Soft diet today. - Continue present medications. - Based on finding of ulcer, would hold Apixaban for another 5 days, if at all possible. - Pantoprazole 40 mg po bid x 6 weeks, then 40 mg po qd thereafter indefinitely. - Perform an H. pylori serology tomorrow. - Miralax 17 grams po bid, to facilitate passage of foreign body. - Serial (daily) abdominal xrays. - [facility] will follow  Antimicrobials: Anti-infectives (From admission, onward)   None     Culture/Microbiology No results found for: SDES, SPECREQUEST, CULT, REPTSTATUS  Other culture-see note  Objective: Vitals: Today's Vitals   12/29/20 2000 12/29/20 2013 12/30/20 0800 12/30/20 0937  BP:  (!) 153/70  129/79  Pulse:  81  78  Resp:  16  18  Temp:  98.4 F (36.9  C)  98.2 F (36.8 C)  TempSrc:  Oral    SpO2:  99%  97%  Weight:      Height:      PainSc: 0-No pain  2      Intake/Output Summary (Last 24  hours) at 12/30/2020 0948 Last data filed at 12/30/2020 0923 Gross per 24 hour  Intake 940 ml  Output 2900 ml  Net -1960 ml   Filed Weights   12/20/20 2325 12/24/20 0357 12/25/20 2026  Weight: 115 kg 95.4 kg 97.6 kg   Weight change:   Intake/Output from previous day: 04/14 0701 - 04/15 0700 In: 840 [P.O.:840] Out: 2100 [Urine:2100] Intake/Output this shift: Total I/O In: 300 [P.O.:300] Out: 800 [Urine:800] Filed Weights   12/20/20 2325 12/24/20 0357 12/25/20 2026  Weight: 115 kg 95.4 kg 97.6 kg    Examination: General exam: AAOx3, hard of hearing, NAD, weak appearing. HEENT:Oral mucosa moist, Ear/Nose WNL grossly, dentition normal. Respiratory system: bilaterally clear,no wheezing or crackles,no use of accessory muscle Cardiovascular system: S1 & S2 +, No JVD,. Gastrointestinal system: Abdomen soft, NT,ND, BS+ Nervous System:Alert, awake, moving extremities and grossly nonfocal Extremities: No edema, distal peripheral pulses palpable.  Skin: No rashes,no icterus. MSK: Normal muscle bulk,tone, power.  Data Reviewed: I have personally reviewed following labs and imaging studies CBC: Recent Labs  Lab 12/26/20 0326 12/27/20 0610 12/28/20 0639 12/29/20 0307 12/30/20 0258  WBC 10.3 11.4* 11.8* 11.3* 8.5  HGB 8.4* 8.9* 9.2* 9.1* 9.5*  HCT 24.6* 26.3* 27.9* 28.0* 29.1*  MCV 91.1 92.0 94.3 95.6 94.2  PLT 153 159 157 146* 295*   Basic Metabolic Panel: Recent Labs  Lab 12/26/20 0326 12/27/20 0611 12/28/20 0639 12/29/20 0307 12/30/20 0258  NA 135 136 137 135 136  K 3.2* 3.5 3.8 4.0 4.2  CL 97* 97* 98 98 100  CO2 29 30 31 29 25   GLUCOSE 118* 107* 104* 109* 106*  BUN 87* 82* 74* 70* 66*  CREATININE 4.30* 4.11* 4.00* 4.13* 3.85*  CALCIUM 9.7 9.5 9.7 9.8 9.9   GFR: Estimated Creatinine Clearance: 17 mL/min (A) (by C-G  formula based on SCr of 3.85 mg/dL (H)). Liver Function Tests: No results for input(s): AST, ALT, ALKPHOS, BILITOT, PROT, ALBUMIN in the last 168 hours. No results for input(s): LIPASE, AMYLASE in the last 168 hours. No results for input(s): AMMONIA in the last 168 hours. Coagulation Profile: No results for input(s): INR, PROTIME in the last 168 hours. Cardiac Enzymes: No results for input(s): CKTOTAL, CKMB, CKMBINDEX, TROPONINI in the last 168 hours. BNP (last 3 results) No results for input(s): PROBNP in the last 8760 hours. HbA1C: No results for input(s): HGBA1C in the last 72 hours. CBG: Recent Labs  Lab 12/26/20 0813  GLUCAP 116*   Lipid Profile: No results for input(s): CHOL, HDL, LDLCALC, TRIG, CHOLHDL, LDLDIRECT in the last 72 hours. Thyroid Function Tests: No results for input(s): TSH, T4TOTAL, FREET4, T3FREE, THYROIDAB in the last 72 hours. Anemia Panel: No results for input(s): VITAMINB12, FOLATE, FERRITIN, TIBC, IRON, RETICCTPCT in the last 72 hours. Sepsis Labs: No results for input(s): PROCALCITON, LATICACIDVEN in the last 168 hours.  Recent Results (from the past 240 hour(s))  Resp Panel by RT-PCR (Flu A&B, Covid) Nasopharyngeal Swab     Status: None   Collection Time: 12/21/20  2:45 AM   Specimen: Nasopharyngeal Swab; Nasopharyngeal(NP) swabs in vial transport medium  Result Value Ref Range Status   SARS Coronavirus 2 by RT PCR NEGATIVE NEGATIVE Final    Comment: (NOTE) SARS-CoV-2 target nucleic acids are NOT DETECTED.  The SARS-CoV-2 RNA is generally detectable in upper respiratory specimens during the acute phase of infection. The lowest concentration of SARS-CoV-2 viral copies this assay can detect is 138 copies/mL. A negative result does not  preclude SARS-Cov-2 infection and should not be used as the sole basis for treatment or other patient management decisions. A negative result may occur with  improper specimen collection/handling, submission of  specimen other than nasopharyngeal swab, presence of viral mutation(s) within the areas targeted by this assay, and inadequate number of viral copies(<138 copies/mL). A negative result must be combined with clinical observations, patient history, and epidemiological information. The expected result is Negative.  Fact Sheet for Patients:  EntrepreneurPulse.com.au  Fact Sheet for Healthcare Providers:  IncredibleEmployment.be  This test is no t yet approved or cleared by the [place] FDA and  has been authorized for detection and/or diagnosis of SARS-CoV-2 by FDA under an Emergency Use Authorization (EUA). This EUA will remain  in effect (meaning this test can be used) for the duration of the COVID-19 declaration under Section 564(b)(1) of the Act, 21 U.S.C.section 360bbb-3(b)(1), unless the authorization is terminated  or revoked sooner.       Influenza A by PCR NEGATIVE NEGATIVE Final   Influenza B by PCR NEGATIVE NEGATIVE Final    Comment: (NOTE) The Xpert Xpress SARS-CoV-2/FLU/RSV plus assay is intended as an aid in the diagnosis of influenza from Nasopharyngeal swab specimens and should not be used as a sole basis for treatment. Nasal washings and aspirates are unacceptable for Xpert Xpress SARS-CoV-2/FLU/RSV testing.  Fact Sheet for Patients: EntrepreneurPulse.com.au  Fact Sheet for Healthcare Providers: IncredibleEmployment.be  This test is not yet approved or cleared by the [place] FDA and has been authorized for detection and/or diagnosis of SARS-CoV-2 by FDA under an Emergency Use Authorization (EUA). This EUA will remain in effect (meaning this test can be used) for the duration of the COVID-19 declaration under Section 564(b)(1) of the Act, 21 U.S.C. section 360bbb-3(b)(1), unless the authorization is terminated or revoked.  Performed at [facility], [place] [address],  [place], [place] [address]      Radiology Studies: DG Abd 1 View  Result Date: 12/28/2020 CLINICAL DATA:  Follow-up metallic foreign body EXAM: ABDOMEN - 1 VIEW COMPARISON:  Multiple films dating back to 12/22/2020 FINDINGS: Scattered large and small bowel gas is noted. Radiopaque foreign body is noted within the rectum similar to that seen on the prior exam. Multiple prostate brachytherapy seeds are noted. Degenerative changes of lumbar spine are seen. IMPRESSION: Foreign body within the rectum stable in appearance from the previous day. Electronically Signed   By: Inez Catalina M.D.   On: 12/28/2020 13:35     LOS: 9 days   Antonieta Pert, MD [facility]  12/30/2020, 9:48 AM

## 2020-12-30 NOTE — Progress Notes (Signed)
Patients new hearing aids are in the room.  Charger is bedside.  Please remove at night or before procedures and put in charger per family.

## 2020-12-31 DIAGNOSIS — S2243XA Multiple fractures of ribs, bilateral, initial encounter for closed fracture: Secondary | ICD-10-CM | POA: Diagnosis not present

## 2020-12-31 LAB — CBC
HCT: 28.7 % — ABNORMAL LOW (ref 39.0–52.0)
Hemoglobin: 9.5 g/dL — ABNORMAL LOW (ref 13.0–17.0)
MCH: 31 pg (ref 26.0–34.0)
MCHC: 33.1 g/dL (ref 30.0–36.0)
MCV: 93.8 fL (ref 80.0–100.0)
Platelets: 120 10*3/uL — ABNORMAL LOW (ref 150–400)
RBC: 3.06 MIL/uL — ABNORMAL LOW (ref 4.22–5.81)
RDW: 16.1 % — ABNORMAL HIGH (ref 11.5–15.5)
WBC: 10.3 10*3/uL (ref 4.0–10.5)
nRBC: 0 % (ref 0.0–0.2)

## 2020-12-31 LAB — BASIC METABOLIC PANEL
Anion gap: 9 (ref 5–15)
BUN: 67 mg/dL — ABNORMAL HIGH (ref 8–23)
CO2: 26 mmol/L (ref 22–32)
Calcium: 9.7 mg/dL (ref 8.9–10.3)
Chloride: 101 mmol/L (ref 98–111)
Creatinine, Ser: 3.93 mg/dL — ABNORMAL HIGH (ref 0.61–1.24)
GFR, Estimated: 14 mL/min — ABNORMAL LOW (ref >60)
Glucose, Bld: 106 mg/dL — ABNORMAL HIGH (ref 70–99)
Potassium: 4.4 mmol/L (ref 3.5–5.1)
Sodium: 136 mmol/L (ref 135–145)

## 2020-12-31 MED ORDER — GABAPENTIN 100 MG PO CAPS
100.0000 mg | ORAL_CAPSULE | Freq: Every day | ORAL | Status: DC
  Administered 2020-12-31: 100 mg via ORAL
  Filled 2020-12-31 (×2): qty 1

## 2020-12-31 NOTE — Plan of Care (Signed)
  Problem: Health Behavior/Discharge Planning: Goal: Ability to manage health-related needs will improve Outcome: Progressing   Problem: Activity: Goal: Risk for activity intolerance will decrease Outcome: Progressing   

## 2020-12-31 NOTE — Progress Notes (Signed)
Telemetry staff reported that patient had a 5 beat run of V-Tach. Patient is resting in bed. Patient is asymptomatic. No signs of distress noted.

## 2020-12-31 NOTE — Plan of Care (Signed)
  Problem: Health Behavior/Discharge Planning: Goal: Ability to manage health-related needs will improve 12/31/2020 2109 by Rico Junker, RN Outcome: Progressing 12/31/2020 2106 by Rico Junker, RN Outcome: Progressing   Problem: Clinical Measurements: Goal: Will remain free from infection 12/31/2020 2109 by Rico Junker, RN Outcome: Progressing 12/31/2020 2106 by Rico Junker, RN Outcome: Progressing   Problem: Activity: Goal: Risk for activity intolerance will decrease 12/31/2020 2109 by Rico Junker, RN Outcome: Progressing 12/31/2020 2106 by Rico Junker, RN Outcome: Progressing   Problem: Nutrition: Goal: Adequate nutrition will be maintained 12/31/2020 2109 by Rico Junker, RN Outcome: Progressing 12/31/2020 2106 by Rico Junker, RN Outcome: Progressing   Problem: Coping: Goal: Level of anxiety will decrease 12/31/2020 2109 by Rico Junker, RN Outcome: Progressing 12/31/2020 2106 by Rico Junker, RN Outcome: Progressing   Problem: Safety: Goal: Ability to remain free from injury will improve 12/31/2020 2109 by Rico Junker, RN Outcome: Progressing 12/31/2020 2106 by Rico Junker, RN Outcome: Progressing   Problem: Education: Goal: Knowledge of General Education information will improve Description: Including pain rating scale, medication(s)/side effects and non-pharmacologic comfort measures 12/31/2020 2109 by Rico Junker, RN Outcome: Progressing 12/31/2020 2106 by Rico Junker, RN Outcome: Progressing   Problem: Clinical Measurements: Goal: Will remain free from infection 12/31/2020 2109 by Rico Junker, RN Outcome: Progressing 12/31/2020 2106 by Rico Junker, RN Outcome: Progressing   Problem: Activity: Goal: Risk for activity intolerance will decrease 12/31/2020 2109 by Rico Junker, RN Outcome: Progressing 12/31/2020 2106 by Rico Junker, RN Outcome: Progressing   Problem: Nutrition: Goal: Adequate nutrition will be maintained 12/31/2020  2109 by Rico Junker, RN Outcome: Progressing 12/31/2020 2106 by Rico Junker, RN Outcome: Progressing   Problem: Coping: Goal: Level of anxiety will decrease 12/31/2020 2109 by Rico Junker, RN Outcome: Progressing 12/31/2020 2106 by Rico Junker, RN Outcome: Progressing   Problem: Elimination: Goal: Will not experience complications related to bowel motility 12/31/2020 2109 by Rico Junker, RN Outcome: Progressing 12/31/2020 2106 by Rico Junker, RN Outcome: Progressing Goal: Will not experience complications related to urinary retention 12/31/2020 2109 by Rico Junker, RN Outcome: Progressing 12/31/2020 2106 by Rico Junker, RN Outcome: Progressing   Problem: Pain Managment: Goal: General experience of comfort will improve 12/31/2020 2109 by Rico Junker, RN Outcome: Progressing 12/31/2020 2106 by Rico Junker, RN Outcome: Progressing   Problem: Safety: Goal: Ability to remain free from injury will improve 12/31/2020 2109 by Rico Junker, RN Outcome: Progressing 12/31/2020 2106 by Rico Junker, RN Outcome: Progressing   Problem: Skin Integrity: Goal: Risk for impaired skin integrity will decrease 12/31/2020 2109 by Rico Junker, RN Outcome: Progressing 12/31/2020 2106 by Rico Junker, RN Outcome: Progressing

## 2020-12-31 NOTE — Progress Notes (Addendum)
PROGRESS NOTE    [name] [name]  YJE:[id number] DOB: 10-06-1932 DOA: 12/20/2020 PCP: Antony Contras, MD   Chief Complaint  Patient presents with  . Fall    Taking Eliquis  Brief Narrative: 87 old male with history of hypertension, A. fib on Eliquis, CAD history of PCI in 6378, chronic systolic CHF EF [id number], GERD, chronic kidney disease stage III brought to the ED after fall at home.  He has had recent falls and while at the shower on 4/5 he fell.  Patient reported that he lost balance. Per report denies any loss of consciousness or trauma to his head. He has been ambulating with use of a walker.  Apparently patient has had severe back pain and sciatica for which he has been seen by physical therapy and has a scheduled appointment with orthopedics.  Due to the pain in his back he has not been sleeping well at night In the ED,he was afebrile,saturating well on room air,CT scan of the head and cervical spine showed no acute abnormality, but did note a partially visualized right-sided pleural effusion.  Labs significant for for hemoglobin 8.7,platelets 140, sodium 122,chloride 92,CO2 20,BUN 80, creatinine 4.45, calcium 10.8, AST 63, serum osmolarity 287, and TSH 2.577.  Urinalysis was positive for 30 mg/dL protein, but showed no significant signs of infection.  CT scan of the chest for bilateral nondisplaced rib fractures, a metallic foreign body in the gastric body measuring 2.1 cm possibly hearing aid, possible duodenitis/peptic ulcer disease, cardiomegaly with signs of fluid overload with pulmonary edema, and a 4.2 cm ascending aortic aneurysm.Patient was given normal saline IVFs at 75 ml/hr, Patient was admitted and nephrology, gastroenterology and cardiology were consulted. Patient became lethargic after getting morphine in the ED. Patient was followed by GI nephrology cardiology. Underwent EGD that showed stomach ulcer no foreign body-GI advised to hold off anticoagulation until 4/13. xray  showed foreign body that has moved below his stoma placed on laxatives and foreign body is further moving currentlly  in rectum 4/12 Had fainting/vasovagal episode while in BM on 4/11 meds were adjusted. Again was orthostatic blood pressure in 70s on standing 4/13- givne iv boluses and meds adjusted Patient did very well with physical therapy his bedside chair not orthostatic on 4/14, also had a bowel movement and passed the foreign body  Subjective:  Resting comfortably.  Again has chronic back pain and has shoulder pain Daughter at the bedside.  Patient is hard of hearing.  He is on room air.  Not dizzy with physical therapy yesterday   Assessment & Plan:  Multiple falls at home Chronic low back pain: B/l shoulder pain Chest wall pain from rib fracture He was scheduled to see [facility] 4/18 as outpatient, has had at least 4  Falls at home  likely multifactorial in setting of hyponatremia and fluid overload chronic low back pain and physical deconditioning.  Remains very deconditioned and weak frail.  PT OT working with him, recommended skilled nursing facility.  Continue pain control with lidocaine patch Tylenol- he is sensitive to narcotics patient and family refusing even the tramadol.  He passed foreign body/hearing aid. - 4/15 CT of cervical spine shows no acute fracture.  There is moderate-severe cervical spondylosis worse C3-C5. - 4/15 CT of lumbar spine shows no acute fracture.  There is multilevel DDD and mild stenosis at L4-L5. - PT recommends SNF placement.  Will discuss with Case Management.  Hypertension Vasovagal episode/syncope while having BM 4/11 am again orthostatic hypotension 4/13-s/p 500  mL bolus normal saline.   - Continue low dose Coreg 3.125 mg BID, Lasix 20 mg QD, and TED hose. - Monitor BP and symptoms.  Foreign body-hearing aid in  GI tract: [employer] 20 stool 4/14.    - Continue Senna and Miralax.  Monitor bowel movement.  Duodenitis/PUD/nonbleeding  gastric ulcer in  EGD 4/7: Eliquis was held from 4/7-4/13.  H Pylori was negative. - Eliquis has been resumed and patient has been stable. - Continue Protonix 40 mg PO BID.  Multiple fractures of ribs,bilateral,due to fall:  - Continue Lidocaine patch and Tylenol PRN. - Add on heating pads and Gabapentin 100 mg daily.  Acute encephalopathy - Avoid narcotics.  Hypervolemic hyponatremia, resolved with diuresis.  AKI on CKD 3b-PCP records shows baseline creatinine 4/21 was 3.8. No value since then, possibly could have progressed since and holding steady in 4.3 range-and has been diuresed, now on p.o. Lasix 40 mg daily- had good uop 2900 ml- we will cot down to 20 mg daily ( if UOP slows down can increase to  40 mg) as he did need fluid boluses for orthostasis.  Creatinine has now improved 3.8 which is baseline at ckd stage IIIb. His CT shows bilateral complex renal cyst.  Nephrology has signed off. He will  need to follow-up with nephrology outpatient and they are planning to see Dr. Hollie Salk upon discharge- info provided.  Total net -12.9 L as below. - Creatinine improved from 4 to 3.8 mg/dL consistent with baseline.  Total net (-) 13 L.  UO x 24 hours is (-) 2L.   Leukocytosis, resolved  CAD S/P angioplasty 2013: - Continue Aspirin, Statin, and Beta Blocker.  Peripheral Vascular Disease: - Continue Aspirin and Statin.  Acute on chronic CHF exacerbation w/ preserved EF:BNP elevated and was hypervolemic on exam. Echo shows normal LV function suspect CHF with preserved EF.  Diuresed with IV Lasix.  Significantly negative balance 12.9 L.  Lasix dose adjusted to 20 mg today.  Continue Coreg low dsoe.Cardiology has signed off. Appreciate cardiology nephrology input   Permanent A. fib on Eliquis now with multiple falls rate controlled on Coreg. DOAC is held for EGD and  Now 2/2 stmomach ulcer- GI advised to hold for 5 days and resumed Eliquis appropriate renal dose 85.5 mg .paient is here for  temporarily from [place] and is followed by cardiologist in [place].  Thrombocytopenia:  - Platelets decreased from 159K to 120K over last 5 days.  Monitor while on Eliquis.  Hyperlipidemia  - Statin.   Morbid Obesity BMI 34: Patient will benefit with weight loss and PCP follow-up.  Diet Order            DIET SOFT Room service appropriate? Yes; Fluid consistency: Thin; Fluid restriction: 1200 mL Fluid  Diet effective now               Patient's Body mass index is 29.87 kg/m.  DVT prophylaxis: SCD. eliquis Code Status:   Code Status: Full Code  Family Communication: plan of care discussed with patient at the bedside.    Status is: Inpatient Remains inpatient appropriate because:IV treatments appropriate due to intensity of illness or inability to take PO and Inpatient level of care appropriate due to severity of illness  Dispo: The patient is from: Home              Anticipated d/c is to: SNF versus  HH-family wanted to talk with social worker about bed offers options  Patient currently is not medically stable to d/c.   Difficult to place patient No Unresulted Labs (From admission, onward)          Start     Ordered   01/01/21 6578  Basic metabolic panel  Daily,   R     Question:  Specimen collection method  Answer:  Lab=Lab collect   12/31/20 0841   01/01/21 0500  CBC  Daily,   R     Question:  Specimen collection method  Answer:  Lab=Lab collect   12/31/20 0841        Medications reviewed:  Scheduled Meds: . allopurinol  100 mg Oral Daily  . apixaban  2.5 mg Oral BID  . aspirin EC  81 mg Oral Daily  . carvedilol  3.125 mg Oral BID WC  . furosemide  20 mg Oral Daily  . gabapentin  100 mg Oral Q1200  . latanoprost  1 drop Both Eyes QHS  . lidocaine  1 patch Transdermal Q24H  . lidocaine  1 patch Transdermal Q24H  . magnesium citrate  1 Bottle Oral Once  . melatonin  3 mg Oral QHS  . pantoprazole  40 mg Oral BID AC  . polyethylene glycol  17 g  Oral BID  . psyllium  1 packet Oral TID  . rosuvastatin  5 mg Oral Q M,W,F  . sodium chloride flush  3 mL Intravenous Q12H   Continuous Infusions: . sodium chloride      Consultants: Nephrology, cardiology GI   Procedures:see note EGD Medium-sized hiatal hernia. - Non-bleeding gastric ulcer with no stigmata of bleeding. - Gastritis. - Duodenitis. - No foreign body seen to the extent of our examination (D2). Impression: None Moderate Sedation: - Return patient to hospital ward for ongoing care. - Soft diet today. - Continue present medications. - Based on finding of ulcer, would hold Apixaban for another 5 days, if at all possible. - Pantoprazole 40 mg po bid x 6 weeks, then 40 mg po qd thereafter indefinitely. - Perform an H. pylori serology tomorrow. - Miralax 17 grams po bid, to facilitate passage of foreign body. - Serial (daily) abdominal xrays. - [facility] will follow  Antimicrobials: Anti-infectives (From admission, onward)   None     Culture/Microbiology No results found for: SDES, SPECREQUEST, CULT, REPTSTATUS  Other culture-see note  Objective: Vitals: Today's Vitals   12/31/20 0537 12/31/20 0800 12/31/20 0930 12/31/20 1500  BP:   (!) 143/95   Pulse:   79   Resp:   20   Temp:   98.4 F (36.9 C)   TempSrc:   Oral   SpO2:   97%   Weight: 99.9 kg     Height:      PainSc:  0-No pain  Asleep    Intake/Output Summary (Last 24 hours) at 12/31/2020 1759 Last data filed at 12/31/2020 1300 Gross per 24 hour  Intake 480 ml  Output 2150 ml  Net -1670 ml   Filed Weights   12/24/20 0357 12/25/20 2026 12/31/20 0537  Weight: 95.4 kg 97.6 kg 99.9 kg   Weight change:   Intake/Output from previous day: 04/15 0701 - 04/16 0700 In: 903 [P.O.:900; I.V.:3] Out: 2950 [Urine:2950] Intake/Output this shift: Total I/O In: 480 [P.O.:480] Out: -  Filed Weights   12/24/20 0357 12/25/20 2026 12/31/20 0537  Weight: 95.4 kg 97.6 kg 99.9 kg     Examination: General exam: AAOx3, hard of hearing, NAD, weak appearing. HEENT:Oral mucosa moist, Ear/Nose WNL  grossly, dentition normal. Respiratory system: bilaterally clear,no wheezing or crackles,no use of accessory muscle Cardiovascular system: S1 & S2 +, No JVD,. Gastrointestinal system: Abdomen soft, NT,ND, BS+ Nervous System:Alert, awake, moving extremities and grossly nonfocal Extremities: No edema, distal peripheral pulses palpable.  Skin: No rashes,no icterus. MSK: Normal muscle bulk,tone, power.  Data Reviewed: I have personally reviewed following labs and imaging studies CBC: Recent Labs  Lab 12/27/20 0610 12/28/20 0639 12/29/20 0307 12/30/20 0258 12/31/20 0237  WBC 11.4* 11.8* 11.3* 8.5 10.3  HGB 8.9* 9.2* 9.1* 9.5* 9.5*  HCT 26.3* 27.9* 28.0* 29.1* 28.7*  MCV 92.0 94.3 95.6 94.2 93.8  PLT 159 157 146* 138* 233*   Basic Metabolic Panel: Recent Labs  Lab 12/27/20 0611 12/28/20 0639 12/29/20 0307 12/30/20 0258 12/31/20 0237  NA 136 137 135 136 136  K 3.5 3.8 4.0 4.2 4.4  CL 97* 98 98 100 101  CO2 30 31 29 25 26   GLUCOSE 107* 104* 109* 106* 106*  BUN 82* 74* 70* 66* 67*  CREATININE 4.11* 4.00* 4.13* 3.85* 3.93*  CALCIUM 9.5 9.7 9.8 9.9 9.7   GFR: Estimated Creatinine Clearance: 16.8 mL/min (A) (by C-G formula based on SCr of 3.93 mg/dL (H)). Liver Function Tests: No results for input(s): AST, ALT, ALKPHOS, BILITOT, PROT, ALBUMIN in the last 168 hours. No results for input(s): LIPASE, AMYLASE in the last 168 hours. No results for input(s): AMMONIA in the last 168 hours. Coagulation Profile: No results for input(s): INR, PROTIME in the last 168 hours. Cardiac Enzymes: No results for input(s): CKTOTAL, CKMB, CKMBINDEX, TROPONINI in the last 168 hours. BNP (last 3 results) No results for input(s): PROBNP in the last 8760 hours. HbA1C: No results for input(s): HGBA1C in the last 72 hours. CBG: Recent Labs  Lab 12/26/20 0813  GLUCAP 116*    Lipid Profile: No results for input(s): CHOL, HDL, LDLCALC, TRIG, CHOLHDL, LDLDIRECT in the last 72 hours. Thyroid Function Tests: No results for input(s): TSH, T4TOTAL, FREET4, T3FREE, THYROIDAB in the last 72 hours. Anemia Panel: No results for input(s): VITAMINB12, FOLATE, FERRITIN, TIBC, IRON, RETICCTPCT in the last 72 hours. Sepsis Labs: No results for input(s): PROCALCITON, LATICACIDVEN in the last 168 hours.  No results found for this or any previous visit (from the past 240 hour(s)).   Radiology Studies: DG Shoulder Right  Result Date: 12/30/2020 CLINICAL DATA:  87 year old male with fall EXAM: RIGHT SHOULDER - 2+ VIEW COMPARISON:  None. FINDINGS: Glenohumeral joint is congruent. Degenerative changes at the acromioclavicular joint. No acute displaced fracture. Unremarkable appearance of the visualized thorax. No radiopaque foreign body. No focal soft tissue swelling IMPRESSION: Negative for acute bony abnormality. Degenerative changes at the [facility] joint Electronically Signed   By: Corrie Mckusick D.O.   On: 12/30/2020 10:43   CT CERVICAL SPINE WO CONTRAST  Result Date: 12/30/2020 CLINICAL DATA:  Provided history: Neck pain, chronic, no prior imaging. Multiple falls, posterior neck and low back pain. EXAM: CT CERVICAL SPINE WITHOUT CONTRAST TECHNIQUE: Multidetector CT imaging of the cervical spine was performed without intravenous contrast. Multiplanar CT image reconstructions were also generated. COMPARISON:  Cervical spine CT 12/21/2020. FINDINGS: Alignment: 3 mm C4-C5 grade 1 anterolisthesis, unchanged. Skull base and vertebrae: The basion-dental and atlanto-dental intervals are maintained.No evidence of acute fracture to the cervical spine. Soft tissues and spinal canal: No prevertebral fluid or swelling. No visible canal hematoma. Disc levels: Multilevel disc space narrowing. Most notably, there is moderate to moderately advanced disc space narrowing at C5-C6 and  C6-C7. C2-C3: Small  central disc protrusion. Facet arthrosis. Mild relative spinal canal narrowing. No significant bony neural foraminal narrowing. C3-C4: Broad-based central disc protrusion uncovertebral hypertrophy. Facet arthrosis. Ligamentum flavum hypertrophy. Suspected moderate spinal canal stenosis. Bilateral bony neural foraminal narrowing (severe right, moderate left). C4-C5: Grade 1 anterolisthesis. Disc uncovering with slight disc bulge. Facet arthrosis. No appreciable significant spinal canal stenosis. Bilateral bony neural foraminal narrowing (moderate left). C5-C6: Disc bulge with mild endplate spurring. Mild spinal canal stenosis. Mild left bony neural foraminal narrowing. C6-C7: Disc bulge with endplate spurring and bilateral disc osteophyte ridge/uncinate hypertrophy. Suspected mild/moderate spinal canal stenosis. Bilateral bony neural foraminal narrowing (moderate right, moderate/severe left). C7-T1: No significant disc herniation or spinal canal stenosis. Endplate spurring on the right contributes to moderate right neural foraminal narrowing. Upper chest: No consolidation within the imaged lung apices. No visible pneumothorax. IMPRESSION: No evidence of acute fracture to the cervical spine. 3 mm C4-C5 grade 1 anterolisthesis, unchanged. Cervical spondylosis with multilevel spinal canal and neural foraminal narrowing. Most notably, there is suspected moderate spinal canal stenosis at C3-C4 and suspected mild/moderate spinal canal stenosis at C6-C7. Neural foraminal narrowing is greatest bilaterally at C3-C4 (severe right, moderate left), bilaterally at C4-C5 (moderate) and bilaterally at C6-C7 (moderate right, moderate/severe left). Electronically Signed   By: Kellie Simmering DO   On: 12/30/2020 13:41   CT LUMBAR SPINE WO CONTRAST  Result Date: 12/30/2020 CLINICAL DATA:  History of multiple falls.  Low back pain. EXAM: CT LUMBAR SPINE WITHOUT CONTRAST TECHNIQUE: Multidetector CT imaging of the lumbar spine was  performed without intravenous contrast administration. Multiplanar CT image reconstructions were also generated. COMPARISON:  Lumbar spine CT scan 12/21/2020 FINDINGS: Segmentation: There are five lumbar type vertebral bodies. The last full intervertebral disc space is labeled L5-S1. Alignment: Normal Vertebrae: Age related osteoporosis. No acute lumbar fracture. No worrisome bone lesions. The pedicles and facets are intact. No pars defects. Remote healed left L2 and L3 transverse process fractures. Paraspinal and other soft tissues: No significant paraspinal or retroperitoneal findings. Advanced atherosclerotic calcifications involving the aorta and branch vessels. There are numerous bilateral renal cysts. No retroperitoneal mass. Disc levels: Multilevel disc disease and facet disease with bulging discs but no large disc protrusions, significant spinal or foraminal stenosis. Diffuse bulging annulus and mild ligamentum flavum thickening at L4-5 with mild spinal and bilateral lateral recess stenosis. IMPRESSION: 1. Normal alignment of the lumbar vertebral bodies and no acute fracture. 2. Remote healed left L2 and L3 transverse process fractures. 3. Multilevel disc disease and facet disease but no large disc protrusions, significant spinal or foraminal stenosis. 4. Mild spinal and bilateral lateral recess stenosis at L4-5. 5. Advanced atherosclerotic calcifications involving the aorta and branch vessels. 6. Aortic atherosclerosis. Aortic Atherosclerosis (ICD10-I70.0). Electronically Signed   By: Marijo Sanes M.D.   On: 12/30/2020 14:28   DG Shoulder Left  Result Date: 12/30/2020 CLINICAL DATA:  87 year old male with fall and pain EXAM: LEFT SHOULDER - 2+ VIEW COMPARISON:  None. FINDINGS: Glenohumeral joint is congruent. Degenerative changes of the acromioclavicular joint. No acute displaced fracture. No radiopaque foreign body. No focal soft tissue swelling. Visualized thorax unremarkable. IMPRESSION: Negative  for acute bony abnormality. Degenerative changes of the [facility] joint. Electronically Signed   By: Corrie Mckusick D.O.   On: 12/30/2020 10:44     LOS: 10 days   George Hugh, MD [facility]  12/31/2020, 5:59 PM

## 2021-01-01 DIAGNOSIS — S2243XA Multiple fractures of ribs, bilateral, initial encounter for closed fracture: Secondary | ICD-10-CM | POA: Diagnosis not present

## 2021-01-01 LAB — HEPATIC FUNCTION PANEL
ALT: 23 U/L (ref 0–44)
AST: 18 U/L (ref 15–41)
Albumin: 2.7 g/dL — ABNORMAL LOW (ref 3.5–5.0)
Alkaline Phosphatase: 81 U/L (ref 38–126)
Bilirubin, Direct: 0.2 mg/dL (ref 0.0–0.2)
Indirect Bilirubin: 0.5 mg/dL (ref 0.3–0.9)
Total Bilirubin: 0.7 mg/dL (ref 0.3–1.2)
Total Protein: 5.4 g/dL — ABNORMAL LOW (ref 6.5–8.1)

## 2021-01-01 LAB — CBC
HCT: 28.1 % — ABNORMAL LOW (ref 39.0–52.0)
Hemoglobin: 9.2 g/dL — ABNORMAL LOW (ref 13.0–17.0)
MCH: 30.8 pg (ref 26.0–34.0)
MCHC: 32.7 g/dL (ref 30.0–36.0)
MCV: 94 fL (ref 80.0–100.0)
Platelets: 127 10*3/uL — ABNORMAL LOW (ref 150–400)
RBC: 2.99 MIL/uL — ABNORMAL LOW (ref 4.22–5.81)
RDW: 15.9 % — ABNORMAL HIGH (ref 11.5–15.5)
WBC: 9.5 10*3/uL (ref 4.0–10.5)
nRBC: 0 % (ref 0.0–0.2)

## 2021-01-01 LAB — BASIC METABOLIC PANEL
Anion gap: 9 (ref 5–15)
BUN: 63 mg/dL — ABNORMAL HIGH (ref 8–23)
CO2: 26 mmol/L (ref 22–32)
Calcium: 9.5 mg/dL (ref 8.9–10.3)
Chloride: 103 mmol/L (ref 98–111)
Creatinine, Ser: 4.08 mg/dL — ABNORMAL HIGH (ref 0.61–1.24)
GFR, Estimated: 14 mL/min — ABNORMAL LOW (ref >60)
Glucose, Bld: 102 mg/dL — ABNORMAL HIGH (ref 70–99)
Potassium: 4.1 mmol/L (ref 3.5–5.1)
Sodium: 138 mmol/L (ref 135–145)

## 2021-01-01 MED ORDER — GABAPENTIN 100 MG PO CAPS
100.0000 mg | ORAL_CAPSULE | Freq: Every day | ORAL | Status: DC
  Administered 2021-01-01 – 2021-01-02 (×2): 100 mg via ORAL
  Filled 2021-01-01 (×2): qty 1

## 2021-01-01 MED ORDER — GABAPENTIN 100 MG PO CAPS
100.0000 mg | ORAL_CAPSULE | Freq: Every day | ORAL | Status: DC
  Administered 2021-01-01 – 2021-01-03 (×3): 100 mg via ORAL
  Filled 2021-01-01 (×3): qty 1

## 2021-01-01 NOTE — Plan of Care (Signed)
  Problem: Health Behavior/Discharge Planning: Goal: Ability to manage health-related needs will improve Outcome: Progressing   Problem: Activity: Goal: Risk for activity intolerance will decrease Outcome: Progressing   Problem: Activity: Goal: Risk for activity intolerance will decrease Outcome: Progressing

## 2021-01-01 NOTE — Progress Notes (Addendum)
PROGRESS NOTE    [name] [name]  WOE:[id number] DOB: Feb 01, 1933 DOA: 12/20/2020 PCP: Antony Contras, MD   Chief Complaint  Patient presents with  . Fall    Taking Eliquis  Brief Narrative: 87 year old male from [place] with [place] of Hypertension, CAD s/p 2013 PCI, Chronic Systolic Heart Failure with EF 45-50%, Persistent Atrial Fibrillation on Eliquis, and CKD3 who presents to the ED on 4/6 s/p fall with trauma to the face and chest.  Imaging revealed bilateral nondisplaced rib fractures, and a foreign body (presumably his hearing aid) in the stomach.  He was also found to have mild dizziness with positive orthostatics, hyponatremia with Na 122 mmol/L, and Creatinine 4.45 mg/dL with baseline ~ 4 mg/dL.  Patient was managed with fluids and supportive care, and his symptoms resolved and electrolyte levels normalized.  On 4/7, patient had an EGD which showed a non-bleeding 8 mm gastric ulcer and mild gastritis and duodenitis.  There was no foreign body seen.  On 4/14, patient allegedly passed foreign body with bowel movement.  Subjective: Patient is a little sleepy this morning. His son states that the Gabapentin significantly helped his back pain and helped him sleep overnight. Patient is doing well with physical therapy. We discussed plan to discharge to facility for short term Rehab.  Patient agrees.  Assessment & Plan:  Frequent Falls / Physical Deconditioning  - The frequent falls were due to dehydration and physical deconditioning. - Patient has clinically improved and is doing well with physical therapy. - We are awaiting Rehab placement.  Chronic Lower Back Pain / Bilateral Rib Fractures: - 4/15 CT of cervical spine shows no acute fracture.  There is moderate-severe cervical spondylosis worse C3-C5. - 4/15 CT of lumbar spine shows no acute fracture.  There is multilevel DDD and mild stenosis at L4-L5. - Avoid narcotics as patient is sensitive and develops drowsiness and  hallucinations. - Continue Lidocaine patch, Heating Pads qshift, Gabapentin 100 mg at 2pm and 100 mg at 9pm (low dose given renal function), and Tylenol PRN.  Chronic Hypertension: - Continue low dose Coreg 3.125 mg BID, Lasix 20 mg QD, and TED hose. - Monitor BP and symptoms.  Constipation, resolved:   - Continue Senna. - Discontinue Miralax.  Mild Gastritis and Mild PUD: 4/7 EGD showed a non-bleeding 8 mm gastric ulcer and mild gastritis and duodenitis.  H. Pylori was negative. - OK to continue Eliquis. - Continue Protonix 40 mg PO BID.  Chronic Kidney Disease Stage 3B: - Creatinine is 4 mg/dL close to baseline ~ 3.8. - Hold Lasix and encourage oral hydration.  If does not improve by tomorrow, will start gentle fluids. - I/O (-) 15L.  UO 1L x 24 hours.  CAD S/P 2013 PCI:  - Continue Aspirin, Statin, and Beta Blocker.  Peripheral Vascular Disease: - Continue Aspirin and Statin.  Chronic Diastolic Heart Failure with EF 55-60%: - Patient is euvolemic.  Hold Lasix. - Continue Coreg.  Hold ACE/ARB due to renal function.  Chronic Atrial Fibrillation: - Continue Coreg for rate control. - Continue Eliquis for anticoagulation.  Mild Thrombocytopenia:  - Platelets decreased from 159K to 127K over the last 5 days.  Monitor while on Eliquis.  Hyperlipidemia  - Statin.   Morbid Obesity BMI 34:  - Patient was counseled on weight loss through and diet and lifestyle modifications.  Diet Order            DIET SOFT Room service appropriate? Yes; Fluid consistency: Thin; Fluid restriction: 1200 mL  Fluid  Diet effective now               Patient's Body mass index is 29.84 kg/m.  DVT prophylaxis: SCD. eliquis Code Status:   Code Status: Full Code  Family Communication: plan of care discussed with patient at the bedside.    Status is: Inpatient Remains inpatient appropriate because:IV treatments appropriate due to intensity of illness or inability to take PO and Inpatient level  of care appropriate due to severity of illness  Dispo: The patient is from: Home              Anticipated d/c is to: Family and patient agrees to SNF placement.  Patient is medically stable for discharge.  We are awaiting placement. Appreciate Case Management.              Patient currently is not medically stable to d/c.   Difficult to place patient No Unresulted Labs (From admission, onward)          Start     Ordered   01/01/21 5956  Basic metabolic panel  Daily,   R     Question:  Specimen collection method  Answer:  Lab=Lab collect   12/31/20 0841   01/01/21 0500  CBC  Daily,   R     Question:  Specimen collection method  Answer:  Lab=Lab collect   12/31/20 0841        Medications reviewed:  Scheduled Meds: . allopurinol  100 mg Oral Daily  . apixaban  2.5 mg Oral BID  . aspirin EC  81 mg Oral Daily  . carvedilol  3.125 mg Oral BID WC  . furosemide  20 mg Oral Daily  . gabapentin  100 mg Oral Q1200  . gabapentin  100 mg Oral QHS  . latanoprost  1 drop Both Eyes QHS  . lidocaine  1 patch Transdermal Q24H  . lidocaine  1 patch Transdermal Q24H  . magnesium citrate  1 Bottle Oral Once  . melatonin  3 mg Oral QHS  . pantoprazole  40 mg Oral BID AC  . rosuvastatin  5 mg Oral Q M,W,F  . sodium chloride flush  3 mL Intravenous Q12H   Continuous Infusions: . sodium chloride      Consultants: Nephrology, cardiology GI   Procedures:see note EGD Medium-sized hiatal hernia. - Non-bleeding gastric ulcer with no stigmata of bleeding. - Gastritis. - Duodenitis. - No foreign body seen to the extent of our examination (D2). Impression: None Moderate Sedation: - Return patient to hospital ward for ongoing care. - Soft diet today. - Continue present medications. - Based on finding of ulcer, would hold Apixaban for another 5 days, if at all possible. - Pantoprazole 40 mg po bid x 6 weeks, then 40 mg po qd thereafter indefinitely. - Perform an H. pylori serology  tomorrow. - Miralax 17 grams po bid, to facilitate passage of foreign body. - Serial (daily) abdominal xrays. - [facility] will follow  Antimicrobials: Anti-infectives (From admission, onward)   None     Culture/Microbiology No results found for: SDES, SPECREQUEST, CULT, REPTSTATUS  Other culture-see note  Objective: Vitals: Today's Vitals   01/01/21 0649 01/01/21 0743 01/01/21 1007 01/01/21 1200  BP:   (!) 140/92   Pulse:   71   Resp:   18   Temp:   98.1 F (36.7 C)   TempSrc:   Oral   SpO2:   97%   Weight: 99.8 kg  Height:      PainSc:  0-No pain  3     Intake/Output Summary (Last 24 hours) at 01/01/2021 1539 Last data filed at 01/01/2021 0900 Gross per 24 hour  Intake 580 ml  Output 1100 ml  Net -520 ml   Filed Weights   12/25/20 2026 12/31/20 0537 01/01/21 0649  Weight: 97.6 kg 99.9 kg 99.8 kg   Weight change: -0.1 kg  Intake/Output from previous day: 04/16 0701 - 04/17 0700 In: 840 [P.O.:840] Out: 1900 [Urine:1900] Intake/Output this shift: Total I/O In: 220 [P.O.:220] Out: -  Filed Weights   12/25/20 2026 12/31/20 0537 01/01/21 0649  Weight: 97.6 kg 99.9 kg 99.8 kg    Examination: General exam: AAOx3, hard of hearing, NAD, weak appearing. HEENT:Oral mucosa moist, Ear/Nose WNL grossly, dentition normal. Respiratory system: bilaterally clear,no wheezing or crackles,no use of accessory muscle Cardiovascular system: S1 & S2 +, No JVD,. Gastrointestinal system: Abdomen soft, NT,ND, BS+ Nervous System:Alert, awake, moving extremities and grossly nonfocal Extremities: No edema, distal peripheral pulses palpable.  Skin: No rashes,no icterus. MSK: Normal muscle bulk,tone, power.  Data Reviewed: I have personally reviewed following labs and imaging studies CBC: Recent Labs  Lab 12/28/20 0639 12/29/20 0307 12/30/20 0258 12/31/20 0237 01/01/21 0552  WBC 11.8* 11.3* 8.5 10.3 9.5  HGB 9.2* 9.1* 9.5* 9.5* 9.2*  HCT 27.9* 28.0* 29.1* 28.7* 28.1*  MCV  94.3 95.6 94.2 93.8 94.0  PLT 157 146* 138* 120* 749*   Basic Metabolic Panel: Recent Labs  Lab 12/28/20 0639 12/29/20 0307 12/30/20 0258 12/31/20 0237 01/01/21 0552  NA 137 135 136 136 138  K 3.8 4.0 4.2 4.4 4.1  CL 98 98 100 101 103  CO2 31 29 25 26 26   GLUCOSE 104* 109* 106* 106* 102*  BUN 74* 70* 66* 67* 63*  CREATININE 4.00* 4.13* 3.85* 3.93* 4.08*  CALCIUM 9.7 9.8 9.9 9.7 9.5   GFR: Estimated Creatinine Clearance: 16.2 mL/min (A) (by C-G formula based on SCr of 4.08 mg/dL (H)). Liver Function Tests: Recent Labs  Lab 01/01/21 0552  AST 18  ALT 23  ALKPHOS 81  BILITOT 0.7  PROT 5.4*  ALBUMIN 2.7*   No results for input(s): LIPASE, AMYLASE in the last 168 hours. No results for input(s): AMMONIA in the last 168 hours. Coagulation Profile: No results for input(s): INR, PROTIME in the last 168 hours. Cardiac Enzymes: No results for input(s): CKTOTAL, CKMB, CKMBINDEX, TROPONINI in the last 168 hours. BNP (last 3 results) No results for input(s): PROBNP in the last 8760 hours. HbA1C: No results for input(s): HGBA1C in the last 72 hours. CBG: Recent Labs  Lab 12/26/20 0813  GLUCAP 116*   Lipid Profile: No results for input(s): CHOL, HDL, LDLCALC, TRIG, CHOLHDL, LDLDIRECT in the last 72 hours. Thyroid Function Tests: No results for input(s): TSH, T4TOTAL, FREET4, T3FREE, THYROIDAB in the last 72 hours. Anemia Panel: No results for input(s): VITAMINB12, FOLATE, FERRITIN, TIBC, IRON, RETICCTPCT in the last 72 hours. Sepsis Labs: No results for input(s): PROCALCITON, LATICACIDVEN in the last 168 hours.  No results found for this or any previous visit (from the past 240 hour(s)).   Radiology Studies: No results found.   LOS: 11 days   George Hugh, MD [facility]  01/01/2021, 3:39 PM

## 2021-01-02 DIAGNOSIS — S2243XA Multiple fractures of ribs, bilateral, initial encounter for closed fracture: Secondary | ICD-10-CM | POA: Diagnosis not present

## 2021-01-02 LAB — CBC
HCT: 29.5 % — ABNORMAL LOW (ref 39.0–52.0)
Hemoglobin: 9.7 g/dL — ABNORMAL LOW (ref 13.0–17.0)
MCH: 30.9 pg (ref 26.0–34.0)
MCHC: 32.9 g/dL (ref 30.0–36.0)
MCV: 93.9 fL (ref 80.0–100.0)
Platelets: 137 10*3/uL — ABNORMAL LOW (ref 150–400)
RBC: 3.14 MIL/uL — ABNORMAL LOW (ref 4.22–5.81)
RDW: 15.7 % — ABNORMAL HIGH (ref 11.5–15.5)
WBC: 10.1 10*3/uL (ref 4.0–10.5)
nRBC: 0 % (ref 0.0–0.2)

## 2021-01-02 LAB — BASIC METABOLIC PANEL
Anion gap: 7 (ref 5–15)
BUN: 61 mg/dL — ABNORMAL HIGH (ref 8–23)
CO2: 26 mmol/L (ref 22–32)
Calcium: 9.7 mg/dL (ref 8.9–10.3)
Chloride: 103 mmol/L (ref 98–111)
Creatinine, Ser: 4.07 mg/dL — ABNORMAL HIGH (ref 0.61–1.24)
GFR, Estimated: 14 mL/min — ABNORMAL LOW (ref >60)
Glucose, Bld: 90 mg/dL (ref 70–99)
Potassium: 4 mmol/L (ref 3.5–5.1)
Sodium: 136 mmol/L (ref 135–145)

## 2021-01-02 MED ORDER — CARVEDILOL 6.25 MG PO TABS
6.2500 mg | ORAL_TABLET | Freq: Two times a day (BID) | ORAL | Status: DC
  Administered 2021-01-02 – 2021-01-04 (×5): 6.25 mg via ORAL
  Filled 2021-01-02 (×5): qty 1

## 2021-01-02 MED ORDER — LACTATED RINGERS IV SOLN: INTRAVENOUS | Status: AC

## 2021-01-02 NOTE — Progress Notes (Signed)
Physical Therapy Treatment Patient Details Name: [name] [name] MRN: [id number] DOB: 04-14-1933 Today's Date: 01/02/2021    History of Present Illness Pt adm 4/5 with falls and hyponatremia. Pt found to have bilateral nondisplaced anterior rib fx's. Pt also inadvertantly had swallowed his hearing aide. Hospital course complicated by postural hypotension; GI to retrieve hearing aide. PMH - HTN, afib, cad, chf, ckd, prostate CA    PT Comments    Continuing work on functional mobility and activity tolerance;  Able to walk today with mod assist and very close chair follow as his knee buckle with fatigue and he sits quickly (and sometimes unexpectedly); Seemed quite pleased to be able to walk   Follow Up Recommendations  SNF     Equipment Recommendations  Wheelchair (measurements PT);Wheelchair cushion (measurements PT)    Recommendations for Other Services       Precautions / Restrictions Precautions Precautions: Fall Precaution Comments: Watch for postural hypotension; for amb, keep the chair follow VERY close; son reports about 5 falls in the past 3 months Restrictions Other Position/Activity Restrictions: rib fractures    Mobility  Bed Mobility Overal bed mobility: Needs Assistance Bed Mobility: Supine to Sit     Supine to sit: Mod assist     General bed mobility comments: Cues for technique and used rails; slow moving, and takes extra tiem to find stability in sitting on his own (with close guard)    Transfers Overall transfer level: Needs assistance Equipment used: Rolling walker (2 wheeled) Transfers: Sit to/from Stand Sit to Stand: Mod assist;+2 safety/equipment         General transfer comment: Heavy mod assist to push up to stand; cues for hand placement and safety; Needs a very close chair follow, as he has little to no control of his descent to sit  Ambulation/Gait Ambulation/Gait assistance: Mod assist;+2 safety/equipment Gait Distance (Feet): 15 Feet  (5+10) Assistive device: Rolling walker (2 wheeled) Gait Pattern/deviations: Decreased step length - right;Decreased step length - left;Decreased stride length;Trunk flexed Gait velocity: decr   General Gait Details: Walked approx 5 ft, then noted L knee buckle and needed Max assist to prevent fall, brought cahir quickly to pt; Next bout was able to walk further, with cues to activate quads with each step for better knee control; L knee buckle again necessitated sitting back down to recliner (which was kept very close to pt)   Chief Strategy Officer    Modified Rankin (Stroke Patients Only)       Balance     Sitting balance-Leahy Scale: Fair       Standing balance-Leahy Scale: Poor                              Cognition Arousal/Alertness: Awake/alert Behavior During Therapy: WFL for tasks assessed/performed Overall Cognitive Status: Impaired/Different from baseline Area of Impairment: Problem solving                     Memory: Decreased short-term memory Following Commands: Follows one step commands with increased time Safety/Judgement: Decreased awareness of safety            Exercises Total Joint Exercises Gluteal Sets: AROM;Both;10 reps Towel Squeeze: AROM;Both;10 reps Short Arc Quad: AROM;Both;15 reps (alternating)    General Comments        Pertinent Vitals/Pain Pain Assessment: Faces Faces Pain Scale: Hurts little more Pain  Location: upper back, LEs during exercise L>R Pain Descriptors / Indicators: Guarding;Grimacing Pain Intervention(s): Monitored during session    Home Living                      Prior Function            PT Goals (current goals can now be found in the care plan section) Acute Rehab PT Goals Patient Stated Goal: Very motivated to get up and move and walk PT Goal Formulation: With patient/family Time For Goal Achievement: 01/05/21 Potential to Achieve Goals:  Good Progress towards PT goals: Progressing toward goals    Frequency    Min 2X/week      PT Plan Current plan remains appropriate    Co-evaluation              AM-PAC PT "6 Clicks" Mobility   Outcome Measure  Help needed turning from your back to your side while in a flat bed without using bedrails?: A Little Help needed moving from lying on your back to sitting on the side of a flat bed without using bedrails?: A Lot Help needed moving to and from a bed to a chair (including a wheelchair)?: A Lot Help needed standing up from a chair using your arms (e.g., wheelchair or bedside chair)?: A Lot Help needed to walk in hospital room?: A Lot Help needed climbing 3-5 steps with a railing? : Total 6 Click Score: 12    End of Session Equipment Utilized During Treatment: Gait belt Activity Tolerance: Patient tolerated treatment well Patient left: in chair;with call bell/phone within reach;with chair alarm set Nurse Communication: Mobility status;Other (comment) (consider stedy) PT Visit Diagnosis: Other abnormalities of gait and mobility (R26.89);Muscle weakness (generalized) (M62.81);History of falling (Z91.81)     Time: 1610-9604 PT Time Calculation (min) (ACUTE ONLY): 35 min  Charges:  $Gait Training: 23-37 mins                     Roney [place], [place]  Acute Rehabilitation Services Pager [phone] Office [place] 01/02/2021, 5:14 PM

## 2021-01-02 NOTE — TOC Progression Note (Signed)
Transition of Care [facility]) - Progression Note    Patient Details  Name: Wiatt Mahabir MRN: [id number] Date of Birth: 04/23/1933  [facility]) CM/SW Contact  Sharlet Salina Mila Homer, LCSW Phone Number: 01/02/2021, 4:09 PM  Clinical Narrative:  Met with daughter Olin Hauser and patient at bedside and facility responses (bed offers) provided via Medicare.gov SNF list. Daughter informed of process once she provides facility choice, which includes submitting for insurance authorization. Daughter explained that she has to communicate with her sibling and they will check out facilities online. CSW explained to daughter that her dad is medically stable for discharge per MD.  CSW will continue to follow and provided SW intervention services as needed through discharge.   Expected Discharge Plan: [place] Barriers to Discharge: Continued Medical Work up  Expected Discharge Plan and Services Expected Discharge Plan: [place] arrangements for the past 2 months: Single Family Home                                     Social Determinants of Health (SDOH) Interventions  None requested or needed at this time.  Readmission Risk Interventions No flowsheet data found.

## 2021-01-02 NOTE — Plan of Care (Signed)
  Problem: Health Behavior/Discharge Planning: Goal: Ability to manage health-related needs will improve Outcome: Progressing   Problem: Clinical Measurements: Goal: Will remain free from infection Outcome: Progressing   Problem: Activity: Goal: Risk for activity intolerance will decrease Outcome: Progressing   Problem: Nutrition: Goal: Adequate nutrition will be maintained Outcome: Progressing   Problem: Coping: Goal: Level of anxiety will decrease Outcome: Progressing   Problem: Safety: Goal: Ability to remain free from injury will improve Outcome: Progressing   Problem: Education: Goal: Knowledge of General Education information will improve Description: Including pain rating scale, medication(s)/side effects and non-pharmacologic comfort measures Outcome: Progressing   Problem: Clinical Measurements: Goal: Will remain free from infection Outcome: Progressing   Problem: Activity: Goal: Risk for activity intolerance will decrease Outcome: Progressing   Problem: Nutrition: Goal: Adequate nutrition will be maintained Outcome: Progressing   Problem: Coping: Goal: Level of anxiety will decrease Outcome: Progressing   Problem: Elimination: Goal: Will not experience complications related to bowel motility Outcome: Progressing Goal: Will not experience complications related to urinary retention Outcome: Progressing   Problem: Pain Managment: Goal: General experience of comfort will improve Outcome: Progressing   Problem: Safety: Goal: Ability to remain free from injury will improve Outcome: Progressing   Problem: Skin Integrity: Goal: Risk for impaired skin integrity will decrease Outcome: Progressing

## 2021-01-02 NOTE — Progress Notes (Addendum)
PROGRESS NOTE    [name] [name]  TML:[id number] DOB: 19-Mar-1933 DOA: 12/20/2020 PCP: Antony Contras, MD   Chief Complaint  Patient presents with  . Fall    Taking Eliquis  Brief Narrative: 87 year old male from [place] with [place] of Hypertension, CAD s/p 2013 PCI, Chronic Systolic Heart Failure with EF 45-50%, Persistent Atrial Fibrillation on Eliquis, and CKD3 who presents to the ED on 4/6 s/p fall with trauma to the face and chest.  Imaging revealed bilateral nondisplaced rib fractures, and a foreign body (presumably his hearing aid) in the stomach.  He was also found to have mild dizziness with positive orthostatics, hyponatremia with Na 122 mmol/L, and Creatinine 4.45 mg/dL with baseline ~ 4 mg/dL.  Patient was managed with fluids and supportive care, and his symptoms resolved and electrolyte levels normalized.  On 4/7, patient had an EGD which showed a non-bleeding 8 mm gastric ulcer and mild gastritis and duodenitis.  There was no foreign body seen.  On 4/14, patient allegedly passed foreign body with bowel movement.  Subjective: Patient looks well today. His sleep cycle is a little off - he slept during the day and was awake all night. Family will help [place] get him on a better schedule. I will consult PT so he can have some exercise while inpatient and to help normalize sleep-wake cycle. I discussed plan to discharge to facility for short term Rehab.  Patient and family agree.  Assessment & Plan:  Frequent Falls / Physical Deconditioning  - The frequent falls were due to dehydration and physical deconditioning. - Patient is working with physical therapy. - We are awaiting Rehab placement.  Chronic Lower Back Pain / Bilateral Rib Fractures: - 4/15 CT of cervical spine shows no acute fracture.  There is moderate-severe cervical spondylosis worse C3-C5. - 4/15 CT of lumbar spine shows no acute fracture.  There is multilevel DDD and mild stenosis at L4-L5. - Avoid narcotics as patient is  sensitive and develops drowsiness and hallucinations. - Continue Lidocaine patch, Heating Pads qshift, Gabapentin 100 mg at 2pm and 100 mg at 9pm (low dose given renal function), and Tylenol PRN.  Chronic Hypertension: - BP is a little high.  - Increase Coreg from 3.125 to 6.25 mg BID. - Hold Lasix 20 mg daily.  Constipation, resolved:   - Continue Senna as needed.  Mild Gastritis and Mild PUD: 4/7 EGD showed a non-bleeding 8 mm gastric ulcer and mild gastritis and duodenitis.  H. Pylori was negative. - OK to continue Eliquis and monitor. - Continue Protonix 40 mg PO BID.  Chronic Kidney Disease Stage 3B: - Creatinine is 4 mg/dL close to baseline ~ 3.8. - Start LR 100 CC/hr x 10 hours. - Hold Lasix. - Recheck BMP in am. - I/O (-) 16L.  UO 1.8L x 24 hours.  CAD S/P 2013 PCI:  - Continue Aspirin, Statin, and Beta Blocker.  Peripheral Vascular Disease: - Continue Aspirin and Statin.  Chronic Diastolic Heart Failure with EF 55-60%: - Patient is euvolemic. - Change Lasix 20 mg to PRN. - Continue Coreg.  Hold ACE/ARB due to renal function.  Chronic Atrial Fibrillation: - Continue Coreg for rate control. - Continue Eliquis for anticoagulation.  Mild Thrombocytopenia:  - Platelets decreased from 159K to 127K over the last 5 days.  Monitor while on Eliquis.  Hyperlipidemia  - Statin.   Morbid Obesity BMI 34:  - Patient was counseled on weight loss through and diet and lifestyle modifications.  Diet Order  DIET SOFT Room service appropriate? Yes; Fluid consistency: Thin; Fluid restriction: 1200 mL Fluid  Diet effective now               Patient's Body mass index is 29.42 kg/m.  DVT prophylaxis: SCD. eliquis Code Status:   Code Status: Full Code  Family Communication: plan of care discussed with patient at the bedside.    Status is: Inpatient Remains inpatient appropriate because:IV treatments appropriate due to intensity of illness or inability to take PO  and Inpatient level of care appropriate due to severity of illness  Dispo: The patient is from: Home              Anticipated d/c is to: Family and patient agrees to SNF placement.  Patient is medically stable for discharge.  Case Management discussed bed offers with patient and family - they are deciding.  Appreciate Case Management.              Patient currently is not medically stable to d/c.   Difficult to place patient No Unresulted Labs (From admission, onward)          Start     Ordered   01/01/21 0086  Basic metabolic panel  Daily,   R     Question:  Specimen collection method  Answer:  Lab=Lab collect   12/31/20 0841   01/01/21 0500  CBC  Daily,   R     Question:  Specimen collection method  Answer:  Lab=Lab collect   12/31/20 0841        Medications reviewed:  Scheduled Meds: . allopurinol  100 mg Oral Daily  . apixaban  2.5 mg Oral BID  . aspirin EC  81 mg Oral Daily  . carvedilol  3.125 mg Oral BID WC  . gabapentin  100 mg Oral Q1200  . gabapentin  100 mg Oral QHS  . latanoprost  1 drop Both Eyes QHS  . lidocaine  1 patch Transdermal Q24H  . lidocaine  1 patch Transdermal Q24H  . magnesium citrate  1 Bottle Oral Once  . melatonin  3 mg Oral QHS  . pantoprazole  40 mg Oral BID AC  . rosuvastatin  5 mg Oral Q M,W,F  . sodium chloride flush  3 mL Intravenous Q12H   Continuous Infusions: . sodium chloride    . lactated ringers 100 mL/hr at 01/02/21 0845    Consultants: Nephrology, cardiology GI   Procedures:see note EGD Medium-sized hiatal hernia. - Non-bleeding gastric ulcer with no stigmata of bleeding. - Gastritis. - Duodenitis. - No foreign body seen to the extent of our examination (D2). Impression: None Moderate Sedation: - Return patient to hospital ward for ongoing care. - Soft diet today. - Continue present medications. - Based on finding of ulcer, would hold Apixaban for another 5 days, if at all possible. - Pantoprazole 40 mg po bid x 6  weeks, then 40 mg po qd thereafter indefinitely. - Perform an H. pylori serology tomorrow. - Miralax 17 grams po bid, to facilitate passage of foreign body. - Serial (daily) abdominal xrays. - [facility] will follow  Antimicrobials: Anti-infectives (From admission, onward)   None     Culture/Microbiology No results found for: SDES, SPECREQUEST, CULT, REPTSTATUS  Other culture-see note  Objective: Vitals: Today's Vitals   01/02/21 0500 01/02/21 0502 01/02/21 1000 01/02/21 1414  BP:  (!) 180/87  (!) 154/83  Pulse:  87  75  Resp:  17  17  Temp:  97.8  F (36.6 C)  98.3 F (36.8 C)  TempSrc:  Oral  Oral  SpO2:  100%  95%  Weight: 98.4 kg     Height:      PainSc:   0-No pain     Intake/Output Summary (Last 24 hours) at 01/02/2021 1457 Last data filed at 01/02/2021 0700 Gross per 24 hour  Intake 297 ml  Output 1850 ml  Net -1553 ml   Filed Weights   12/31/20 0537 01/01/21 0649 01/02/21 0500  Weight: 99.9 kg 99.8 kg 98.4 kg   Weight change: -1.4 kg  Intake/Output from previous day: 04/17 0701 - 04/18 0700 In: 754 [P.O.:754] Out: 1850 [Urine:1850] Intake/Output this shift: No intake/output data recorded. Filed Weights   12/31/20 0537 01/01/21 0649 01/02/21 0500  Weight: 99.9 kg 99.8 kg 98.4 kg    Examination: General exam: AAOx3, hard of hearing, NAD, weak appearing. HEENT:Oral mucosa moist, Ear/Nose WNL grossly, dentition normal. Respiratory system: bilaterally clear,no wheezing or crackles,no use of accessory muscle Cardiovascular system: S1 & S2 +, No JVD,. Gastrointestinal system: Abdomen soft, NT,ND, BS+ Nervous System:Alert, awake, moving extremities and grossly nonfocal Extremities: No edema, distal peripheral pulses palpable.  Skin: No rashes,no icterus. MSK: Normal muscle bulk,tone, power.  Data Reviewed: I have personally reviewed following labs and imaging studies CBC: Recent Labs  Lab 12/29/20 0307 12/30/20 0258 12/31/20 0237 01/01/21 0552  01/02/21 0734  WBC 11.3* 8.5 10.3 9.5 10.1  HGB 9.1* 9.5* 9.5* 9.2* 9.7*  HCT 28.0* 29.1* 28.7* 28.1* 29.5*  MCV 95.6 94.2 93.8 94.0 93.9  PLT 146* 138* 120* 127* 831*   Basic Metabolic Panel: Recent Labs  Lab 12/29/20 0307 12/30/20 0258 12/31/20 0237 01/01/21 0552 01/02/21 0734  NA 135 136 136 138 136  K 4.0 4.2 4.4 4.1 4.0  CL 98 100 101 103 103  CO2 29 25 26 26 26   GLUCOSE 109* 106* 106* 102* 90  BUN 70* 66* 67* 63* 61*  CREATININE 4.13* 3.85* 3.93* 4.08* 4.07*  CALCIUM 9.8 9.9 9.7 9.5 9.7   GFR: Estimated Creatinine Clearance: 16.1 mL/min (A) (by C-G formula based on SCr of 4.07 mg/dL (H)). Liver Function Tests: Recent Labs  Lab 01/01/21 0552  AST 18  ALT 23  ALKPHOS 81  BILITOT 0.7  PROT 5.4*  ALBUMIN 2.7*   No results for input(s): LIPASE, AMYLASE in the last 168 hours. No results for input(s): AMMONIA in the last 168 hours. Coagulation Profile: No results for input(s): INR, PROTIME in the last 168 hours. Cardiac Enzymes: No results for input(s): CKTOTAL, CKMB, CKMBINDEX, TROPONINI in the last 168 hours. BNP (last 3 results) No results for input(s): PROBNP in the last 8760 hours. HbA1C: No results for input(s): HGBA1C in the last 72 hours. CBG: No results for input(s): GLUCAP in the last 168 hours. Lipid Profile: No results for input(s): CHOL, HDL, LDLCALC, TRIG, CHOLHDL, LDLDIRECT in the last 72 hours. Thyroid Function Tests: No results for input(s): TSH, T4TOTAL, FREET4, T3FREE, THYROIDAB in the last 72 hours. Anemia Panel: No results for input(s): VITAMINB12, FOLATE, FERRITIN, TIBC, IRON, RETICCTPCT in the last 72 hours. Sepsis Labs: No results for input(s): PROCALCITON, LATICACIDVEN in the last 168 hours.  No results found for this or any previous visit (from the past 240 hour(s)).   Radiology Studies: No results found.   LOS: 12 days   George Hugh, MD [facility]  01/02/2021, 2:57 PM

## 2021-01-03 LAB — CBC
HCT: 27.8 % — ABNORMAL LOW (ref 39.0–52.0)
Hemoglobin: 9.2 g/dL — ABNORMAL LOW (ref 13.0–17.0)
MCH: 30.7 pg (ref 26.0–34.0)
MCHC: 33.1 g/dL (ref 30.0–36.0)
MCV: 92.7 fL (ref 80.0–100.0)
Platelets: 134 10*3/uL — ABNORMAL LOW (ref 150–400)
RBC: 3 MIL/uL — ABNORMAL LOW (ref 4.22–5.81)
RDW: 15.5 % (ref 11.5–15.5)
WBC: 8.8 10*3/uL (ref 4.0–10.5)
nRBC: 0 % (ref 0.0–0.2)

## 2021-01-03 LAB — BASIC METABOLIC PANEL
Anion gap: 7 (ref 5–15)
BUN: 62 mg/dL — ABNORMAL HIGH (ref 8–23)
CO2: 24 mmol/L (ref 22–32)
Calcium: 9.7 mg/dL (ref 8.9–10.3)
Chloride: 105 mmol/L (ref 98–111)
Creatinine, Ser: 3.96 mg/dL — ABNORMAL HIGH (ref 0.61–1.24)
GFR, Estimated: 14 mL/min — ABNORMAL LOW (ref >60)
Glucose, Bld: 92 mg/dL (ref 70–99)
Potassium: 4.1 mmol/L (ref 3.5–5.1)
Sodium: 136 mmol/L (ref 135–145)

## 2021-01-03 NOTE — Progress Notes (Signed)
PROGRESS NOTE    [name] [name]  QZR:[id number] DOB: 1933-04-02 DOA: 12/20/2020 PCP: Antony Contras, MD   Chief Complaint  Patient presents with  . Fall    Taking Eliquis  Brief Narrative: 87 year old male from [place] with [place] of Hypertension, CAD s/p 2013 PCI, Chronic Systolic Heart Failure with EF 45-50%, Persistent Atrial Fibrillation on Eliquis, and CKD3 who presents to the ED on 4/6 s/p fall with trauma to the face and chest.  Imaging revealed bilateral nondisplaced rib fractures, and a foreign body (presumably his hearing aid) in the stomach.  He was also found to have mild dizziness with positive orthostatics, hyponatremia with Na 122 mmol/L, and Creatinine 4.45 mg/dL with baseline ~ 4 mg/dL.  Patient was managed with fluids and supportive care, and his symptoms resolved and electrolyte levels normalized.  On 4/7, patient had an EGD which showed a non-bleeding 8 mm gastric ulcer and mild gastritis and duodenitis.  There was no foreign body seen.  On 4/14, patient allegedly passed foreign body with bowel movement.  Subjective: Patient's sleep cycle is still off. I will discontinue his Gabapentin. PT will work with him during the day. Daughter is in the room who states they have decided on a few options for Rehab - [facility]. Case Management is working on it, appreciate.  Assessment & Plan:  Frequent Falls / Physical Deconditioning  - The frequent falls were due to dehydration and physical deconditioning. - Patient is working with physical therapy. - We are awaiting Rehab placement.  Chronic Lower Back Pain / Bilateral Rib Fractures: - 4/15 CT of cervical spine shows no acute fracture.  There is moderate-severe cervical spondylosis worse C3-C5. - 4/15 CT of lumbar spine shows no acute fracture.  There is multilevel DDD and mild stenosis at L4-L5. - Avoid narcotics as patient is sensitive and develops drowsiness and hallucinations. - Continue Lidocaine patch, Heating Pads  qshift and Tylenol PRN. - Discontinue Gabapentin 100 mg 2pm and 9pm for now.  Can be restarted when patient is on a better sleep-wake cycle.  Chronic Hypertension:  - Continue Coreg at higher dose 6.25 mg BID. - Hold Lasix 20 mg daily.  Constipation, resolved:   - Continue Senna as needed.  Mild Gastritis and Mild PUD: 4/7 EGD showed a non-bleeding 8 mm gastric ulcer and mild gastritis and duodenitis.  H. Pylori was negative. - OK to continue Eliquis and monitor. - Continue Protonix 40 mg PO BID.  Chronic Kidney Disease Stage 3B: - Creatinine is 3.9 mg/dL which is his baseline. - Hold off on fluids.  - Hold off on Lasix. - I/O (-) 16L.  UO 700CCs x 24 hours. - I discussed with patient and family to follow up with Nephrology outpatient.  CAD S/P 2013 PCI:  - Continue Aspirin, Statin, and Beta Blocker.  Peripheral Vascular Disease: - Continue Aspirin and Statin.  Chronic Diastolic Heart Failure with EF 55-60%: - Patient is euvolemic. - Continue Lasix 20 mg PRN. - Continue Coreg 6.25 mg BID.   - Hold ACE/ARB due to renal function.  Chronic Atrial Fibrillation: - Continue Coreg for rate control. - Continue Eliquis 2.5 mg BID (renally dosed) for anticoagulation.  Mild Thrombocytopenia:  - Platelets are around 130K.  Monitor while on Eliquis.  Hyperlipidemia  - Statin.   Morbid Obesity BMI 34:  - Patient was counseled on weight loss through and diet and lifestyle modifications.  Diet Order            DIET  SOFT Room service appropriate? Yes; Fluid consistency: Thin; Fluid restriction: 1200 mL Fluid  Diet effective now               Patient's Body mass index is 29.42 kg/m.  DVT prophylaxis: SCD. On Eliquis Code Status:   Code Status: Full Code  Family Communication: plan of care discussed with patient at the bedside.    Status is: Inpatient Remains inpatient appropriate because:IV treatments appropriate due to intensity of illness or inability to take PO and  Inpatient level of care appropriate due to severity of illness  Dispo: The patient is from: Home              Anticipated d/c is to: Family and patient agrees to SNF placement.  Patient is medically stable for discharge.  Case Management discussed bed offers with patient and family - they are deciding.  Appreciate Case Management.              Patient currently is not medically stable to d/c.   Difficult to place patient No Unresulted Labs (From admission, onward)          Start     Ordered   01/01/21 1937  Basic metabolic panel  Daily,   R     Question:  Specimen collection method  Answer:  Lab=Lab collect   12/31/20 0841   01/01/21 0500  CBC  Daily,   R     Question:  Specimen collection method  Answer:  Lab=Lab collect   12/31/20 0841        Medications reviewed:  Scheduled Meds: . allopurinol  100 mg Oral Daily  . apixaban  2.5 mg Oral BID  . aspirin EC  81 mg Oral Daily  . carvedilol  6.25 mg Oral BID WC  . gabapentin  100 mg Oral Q1200  . gabapentin  100 mg Oral QHS  . latanoprost  1 drop Both Eyes QHS  . lidocaine  1 patch Transdermal Q24H  . lidocaine  1 patch Transdermal Q24H  . magnesium citrate  1 Bottle Oral Once  . melatonin  3 mg Oral QHS  . pantoprazole  40 mg Oral BID AC  . rosuvastatin  5 mg Oral Q M,W,F  . sodium chloride flush  3 mL Intravenous Q12H   Continuous Infusions: . sodium chloride      Consultants: Nephrology, cardiology GI   Procedures:see note EGD Medium-sized hiatal hernia. - Non-bleeding gastric ulcer with no stigmata of bleeding. - Gastritis. - Duodenitis. - No foreign body seen to the extent of our examination (D2). Impression: None Moderate Sedation: - Return patient to hospital ward for ongoing care. - Soft diet today. - Continue present medications. - Based on finding of ulcer, would hold Apixaban for another 5 days, if at all possible. - Pantoprazole 40 mg po bid x 6 weeks, then 40 mg po qd thereafter indefinitely. -  Perform an H. pylori serology tomorrow. - Miralax 17 grams po bid, to facilitate passage of foreign body. - Serial (daily) abdominal xrays. - [facility] will follow  Antimicrobials: Anti-infectives (From admission, onward)   None     Culture/Microbiology No results found for: SDES, SPECREQUEST, CULT, REPTSTATUS  Other culture-see note  Objective: Vitals: Today's Vitals   01/02/21 2145 01/03/21 0546 01/03/21 0845 01/03/21 0915  BP:  (!) 160/99  139/75  Pulse:  67  (!) 52  Resp:  18  17  Temp:  98.6 F (37 C)  98.2 F (36.8 C)  TempSrc:  Oral    SpO2:  97%  97%  Weight:      Height:      PainSc: 0-No pain  0-No pain     Intake/Output Summary (Last 24 hours) at 01/03/2021 1658 Last data filed at 01/03/2021 1300 Gross per 24 hour  Intake 1080 ml  Output 1300 ml  Net -220 ml   Filed Weights   12/31/20 0537 01/01/21 0649 01/02/21 0500  Weight: 99.9 kg 99.8 kg 98.4 kg   Weight change:   Intake/Output from previous day: 04/18 0701 - 04/19 0700 In: 840 [P.O.:840] Out: 700 [Urine:700] Intake/Output this shift: Total I/O In: 600 [P.O.:600] Out: 600 [Urine:600] Filed Weights   12/31/20 0537 01/01/21 0649 01/02/21 0500  Weight: 99.9 kg 99.8 kg 98.4 kg    Examination: General exam: AAOx3, hard of hearing, NAD, weak appearing. HEENT:Oral mucosa moist, Ear/Nose WNL grossly, dentition normal. Respiratory system: bilaterally clear,no wheezing or crackles,no use of accessory muscle Cardiovascular system: S1 & S2 +, No JVD,. Gastrointestinal system: Abdomen soft, NT,ND, BS+ Nervous System:Alert, awake, moving extremities and grossly nonfocal Extremities: No edema, distal peripheral pulses palpable.  Skin: No rashes,no icterus. MSK: Normal muscle bulk,tone, power.  Data Reviewed: I have personally reviewed following labs and imaging studies CBC: Recent Labs  Lab 12/30/20 0258 12/31/20 0237 01/01/21 0552 01/02/21 0734 01/03/21 0707  WBC 8.5 10.3 9.5 10.1 8.8  HGB  9.5* 9.5* 9.2* 9.7* 9.2*  HCT 29.1* 28.7* 28.1* 29.5* 27.8*  MCV 94.2 93.8 94.0 93.9 92.7  PLT 138* 120* 127* 137* 893*   Basic Metabolic Panel: Recent Labs  Lab 12/30/20 0258 12/31/20 0237 01/01/21 0552 01/02/21 0734 01/03/21 0707  NA 136 136 138 136 136  K 4.2 4.4 4.1 4.0 4.1  CL 100 101 103 103 105  CO2 25 26 26 26 24   GLUCOSE 106* 106* 102* 90 92  BUN 66* 67* 63* 61* 62*  CREATININE 3.85* 3.93* 4.08* 4.07* 3.96*  CALCIUM 9.9 9.7 9.5 9.7 9.7   GFR: Estimated Creatinine Clearance: 16.6 mL/min (A) (by C-G formula based on SCr of 3.96 mg/dL (H)). Liver Function Tests: Recent Labs  Lab 01/01/21 0552  AST 18  ALT 23  ALKPHOS 81  BILITOT 0.7  PROT 5.4*  ALBUMIN 2.7*   No results for input(s): LIPASE, AMYLASE in the last 168 hours. No results for input(s): AMMONIA in the last 168 hours. Coagulation Profile: No results for input(s): INR, PROTIME in the last 168 hours. Cardiac Enzymes: No results for input(s): CKTOTAL, CKMB, CKMBINDEX, TROPONINI in the last 168 hours. BNP (last 3 results) No results for input(s): PROBNP in the last 8760 hours. HbA1C: No results for input(s): HGBA1C in the last 72 hours. CBG: No results for input(s): GLUCAP in the last 168 hours. Lipid Profile: No results for input(s): CHOL, HDL, LDLCALC, TRIG, CHOLHDL, LDLDIRECT in the last 72 hours. Thyroid Function Tests: No results for input(s): TSH, T4TOTAL, FREET4, T3FREE, THYROIDAB in the last 72 hours. Anemia Panel: No results for input(s): VITAMINB12, FOLATE, FERRITIN, TIBC, IRON, RETICCTPCT in the last 72 hours. Sepsis Labs: No results for input(s): PROCALCITON, LATICACIDVEN in the last 168 hours.  No results found for this or any previous visit (from the past 240 hour(s)).   Radiology Studies: No results found.   LOS: 13 days   George Hugh, MD [facility]  01/03/2021, 4:58 PM

## 2021-01-03 NOTE — TOC Progression Note (Signed)
Transition of Care [facility]) - Progression Note    Patient Details  Name: Aking Klabunde MRN: [id number] Date of Birth: August 08, 1933  [facility]) CM/SW Contact  Sharlet Salina Mila Homer, LCSW Phone Number: 01/03/2021, 3:35 PM  Clinical Narrative:  Talked with daughter Wilford Sports [phone]) by phone and facility selection provided - [facility] Contacted [facility], admissions director regarding patient and after confirming insurance, CSW was contacted and informed that they can accept patient for [place] rehab.  [employer] contacted and insurance authorization initiated with representative Adirana. Clinicals faxed to [employer]. Reference number [id number]. CSW will continue to follow and provide SW intervention services as needed through discharge.     Expected Discharge Plan: [place] Barriers to Discharge: Continued Medical Work up  Expected Discharge Plan and Services Expected Discharge Plan: [place] arrangements for the past 2 months: Single Family Home                                     Social Determinants of Health (SDOH) Interventions  No SDOH interventions requested or needed at this time.  Readmission Risk Interventions No flowsheet data found.

## 2021-01-03 NOTE — Plan of Care (Signed)
  Problem: Health Behavior/Discharge Planning: Goal: Ability to manage health-related needs will improve Outcome: Progressing   Problem: Clinical Measurements: Goal: Will remain free from infection Outcome: Progressing   Problem: Activity: Goal: Risk for activity intolerance will decrease Outcome: Progressing   Problem: Coping: Goal: Level of anxiety will decrease Outcome: Progressing   Problem: Education: Goal: Knowledge of General Education information will improve Description: Including pain rating scale, medication(s)/side effects and non-pharmacologic comfort measures Outcome: Progressing   Problem: Clinical Measurements: Goal: Will remain free from infection Outcome: Progressing   Problem: Activity: Goal: Risk for activity intolerance will decrease Outcome: Progressing   Problem: Nutrition: Goal: Adequate nutrition will be maintained Outcome: Progressing   Problem: Elimination: Goal: Will not experience complications related to bowel motility Outcome: Progressing Goal: Will not experience complications related to urinary retention Outcome: Progressing   Problem: Pain Managment: Goal: General experience of comfort will improve Outcome: Progressing   Problem: Safety: Goal: Ability to remain free from injury will improve Outcome: Progressing   Problem: Skin Integrity: Goal: Risk for impaired skin integrity will decrease Outcome: Progressing

## 2021-01-03 NOTE — Progress Notes (Signed)
Occupational Therapy Treatment Patient Details Name: [name] [name] MRN: [id number] DOB: September 08, 1933 Today's Date: 01/03/2021    History of present illness Pt adm 4/5 with falls and hyponatremia. Pt found to have bilateral nondisplaced anterior rib fx's. Pt also inadvertantly had swallowed his hearing aide. Hospital course complicated by postural hypotension; GI to retrieve hearing aide. PMH - HTN, afib, cad, chf, ckd, prostate CA   OT comments  Pt. Worked on ADLs in sitting. Pt. Was able to stand for 2-3 minutes for toilet hygine. Pt. Was able to stand for 3 times for 2-3 min. Pt. Worked on  B UE hep. Acute OT to follow.  Follow Up Recommendations  SNF    Equipment Recommendations  None recommended by OT    Recommendations for Other Services      Precautions / Restrictions Precautions Precautions: Fall Precaution Comments: Watch for postural hypotension; for amb, keep the chair follow VERY close; son reports about 5 falls in the past 3 months Restrictions Weight Bearing Restrictions: No       Mobility Bed Mobility         Supine to sit: Min assist          Transfers Overall transfer level: Needs assistance Equipment used: Rolling walker (2 wheeled)   Sit to Stand: Mod assist;+2 safety/equipment Stand pivot transfers: Mod assist;+2 safety/equipment            Balance     Sitting balance-Leahy Scale: Fair       Standing balance-Leahy Scale: Poor                             ADL either performed or assessed with clinical judgement   ADL Overall ADL's : Needs assistance/impaired     Grooming: Supervision/safety;Sitting;Wash/dry hands;Wash/dry Sports administrator: Moderate assistance;+2 for physical assistance;Stand-pivot;BSC;RW   Toileting- Clothing Manipulation and Hygiene: Total assistance;Sit to/from stand       Functional mobility during ADLs: Moderate assistance;+2 for physical assistance;Rolling  walker General ADL Comments: Pt. sat for grooming tasks maintaining good balance.     Vision       Perception     Praxis      Cognition Arousal/Alertness: Awake/alert Behavior During Therapy: WFL for tasks assessed/performed Overall Cognitive Status: Impaired/Different from baseline Area of Impairment: Problem solving                 Orientation Level: Place;Situation   Memory: Decreased short-term memory Following Commands: Follows one step commands with increased time Safety/Judgement: Decreased awareness of safety   Problem Solving: Requires verbal cues;Slow processing;Difficulty sequencing;Requires tactile cues          Exercises Exercises: General Upper Extremity General Exercises - Upper Extremity Shoulder Flexion: AROM;Both;20 reps Shoulder ABduction: Both;20 reps;AROM Elbow Flexion: AROM;Both;20 reps   Shoulder Instructions       General Comments      Pertinent Vitals/ Pain       Pain Assessment: 0-10 Pain Score: 3  Pain Location: b shld Pain Descriptors / Indicators: Aching Pain Intervention(s): Monitored during session  Home Living                                          Prior Functioning/Environment  Frequency  Min 2X/week        Progress Toward Goals  OT Goals(current goals can now be found in the care plan section)  Progress towards OT goals: Progressing toward goals  Acute Rehab OT Goals Patient Stated Goal: get stronger OT Goal Formulation: With family Time For Goal Achievement: 01/05/21 Potential to Achieve Goals: Good ADL Goals Pt Will Perform Grooming: with supervision;sitting;standing Pt Will Perform Upper Body Dressing: with set-up;with supervision;sitting Pt Will Perform Lower Body Dressing: with min guard assist;sit to/from stand;sitting/lateral leans Pt Will Transfer to Toilet: with min guard assist;ambulating;bedside commode Pt Will Perform Toileting - Clothing Manipulation and  hygiene: with min guard assist;sit to/from stand;sitting/lateral leans  Plan Discharge plan remains appropriate    Co-evaluation                 AM-PAC OT "6 Clicks" Daily Activity     Outcome Measure   Help from another person eating meals?: None Help from another person taking care of personal grooming?: A Little Help from another person toileting, which includes using toliet, bedpan, or urinal?: A Lot Help from another person bathing (including washing, rinsing, drying)?: A Lot Help from another person to put on and taking off regular upper body clothing?: A Lot Help from another person to put on and taking off regular lower body clothing?: A Lot 6 Click Score: 15    End of Session Equipment Utilized During Treatment: Gait belt  OT Visit Diagnosis: Other abnormalities of gait and mobility (R26.89);Repeated falls (R29.6);Muscle weakness (generalized) (M62.81);History of falling (Z91.81);Pain;Other symptoms and signs involving cognitive function   Activity Tolerance Patient tolerated treatment well   Patient Left in chair;with call bell/phone within reach;with chair alarm set;with family/visitor present   Nurse Communication  (ok therapy)        Time: 1523-1610 OT Time Calculation (min): 47 min  Charges: OT General Charges $OT Visit: 1 Visit OT Treatments $Self Care/Home Management : 8-22 mins $Therapeutic Exercise: 23-37 mins  [name] [name]'   [name] [name] 01/03/2021, 5:23 PM

## 2021-01-04 LAB — SARS CORONAVIRUS 2 (TAT 6-24 HRS): SARS Coronavirus 2: NEGATIVE

## 2021-01-04 LAB — CBC
HCT: 27.2 % — ABNORMAL LOW (ref 39.0–52.0)
Hemoglobin: 9 g/dL — ABNORMAL LOW (ref 13.0–17.0)
MCH: 31 pg (ref 26.0–34.0)
MCHC: 33.1 g/dL (ref 30.0–36.0)
MCV: 93.8 fL (ref 80.0–100.0)
Platelets: 131 10*3/uL — ABNORMAL LOW (ref 150–400)
RBC: 2.9 MIL/uL — ABNORMAL LOW (ref 4.22–5.81)
RDW: 15.7 % — ABNORMAL HIGH (ref 11.5–15.5)
WBC: 10.2 10*3/uL (ref 4.0–10.5)
nRBC: 0 % (ref 0.0–0.2)

## 2021-01-04 LAB — BASIC METABOLIC PANEL
Anion gap: 10 (ref 5–15)
BUN: 63 mg/dL — ABNORMAL HIGH (ref 8–23)
CO2: 24 mmol/L (ref 22–32)
Calcium: 10 mg/dL (ref 8.9–10.3)
Chloride: 104 mmol/L (ref 98–111)
Creatinine, Ser: 3.98 mg/dL — ABNORMAL HIGH (ref 0.61–1.24)
GFR, Estimated: 14 mL/min — ABNORMAL LOW (ref >60)
Glucose, Bld: 97 mg/dL (ref 70–99)
Potassium: 4.2 mmol/L (ref 3.5–5.1)
Sodium: 138 mmol/L (ref 135–145)

## 2021-01-04 MED ORDER — GABAPENTIN 100 MG PO CAPS: 100.0000 mg | ORAL_CAPSULE | Freq: Once | ORAL | 0 refills | Status: DC | PRN

## 2021-01-04 MED ORDER — LIDOCAINE 5 % EX PTCH: 1.0000 | MEDICATED_PATCH | CUTANEOUS | 0 refills | Status: DC

## 2021-01-04 MED ORDER — ALLOPURINOL 100 MG PO TABS: 100.0000 mg | ORAL_TABLET | Freq: Every day | ORAL | 0 refills | Status: DC

## 2021-01-04 MED ORDER — APIXABAN 2.5 MG PO TABS: 2.5000 mg | ORAL_TABLET | Freq: Two times a day (BID) | ORAL | 0 refills | Status: DC

## 2021-01-04 MED ORDER — PANTOPRAZOLE SODIUM 40 MG PO TBEC: 40.0000 mg | DELAYED_RELEASE_TABLET | Freq: Two times a day (BID) | ORAL | 0 refills | Status: DC

## 2021-01-04 NOTE — Plan of Care (Signed)
  Problem: Health Behavior/Discharge Planning: Goal: Ability to manage health-related needs will improve Outcome: Adequate for Discharge   Problem: Clinical Measurements: Goal: Will remain free from infection Outcome: Adequate for Discharge   Problem: Activity: Goal: Risk for activity intolerance will decrease 01/04/2021 1513 by Dolores Hoose, RN Outcome: Adequate for Discharge 01/04/2021 0757 by Dolores Hoose, RN Outcome: Progressing   Problem: Coping: Goal: Level of anxiety will decrease Outcome: Adequate for Discharge   Problem: Education: Goal: Knowledge of General Education information will improve Description: Including pain rating scale, medication(s)/side effects and non-pharmacologic comfort measures Outcome: Adequate for Discharge   Problem: Clinical Measurements: Goal: Will remain free from infection Outcome: Adequate for Discharge   Problem: Activity: Goal: Risk for activity intolerance will decrease Outcome: Adequate for Discharge   Problem: Nutrition: Goal: Adequate nutrition will be maintained Outcome: Adequate for Discharge   Problem: Coping: Goal: Level of anxiety will decrease Outcome: Adequate for Discharge   Problem: Elimination: Goal: Will not experience complications related to bowel motility Outcome: Adequate for Discharge Goal: Will not experience complications related to urinary retention Outcome: Adequate for Discharge   Problem: Pain Managment: Goal: General experience of comfort will improve 01/04/2021 1513 by Dolores Hoose, RN Outcome: Adequate for Discharge 01/04/2021 0757 by Dolores Hoose, RN Outcome: Progressing   Problem: Safety: Goal: Ability to remain free from injury will improve Outcome: Adequate for Discharge   Problem: Skin Integrity: Goal: Risk for impaired skin integrity will decrease Outcome: Adequate for Discharge

## 2021-01-04 NOTE — TOC Transition Note (Signed)
Transition of Care (TOC) - CM/SW Discharge Note *Discharged to [facility] *Room 101 *Number for Report - [phone]   Patient Details  Name: [name] [name] MRN: [id number] Date of Birth: [birth year]  Transition of Care [facility]) CM/SW Contact:  [name] Feil, LCSW Phone Number: 01/04/2021, 2:27 PM   Clinical Narrative: Patient medically stable for discharge and going to [facility] for [place] rehab. Insurance authorization received this morning (8:13 am) from [employer]: Josem Kaufmann ID - [id number] *Start date - 10/05/20 *Next review - 4/21 *Care Coordinator to be assigned *fax number for continued stay clinicals - [phone] Star, admissions director at [facility] contacted and provided with auth information.    Visited with patient and daughter at the bedside (2:08 pm) and talked with them about today's discharge and waiting for COVID test to result.     Final next level of care: [facility]  - Room 101) Barriers to Discharge: Barriers Resolved   Patient Goals and CMS Choice Patient states their goals for this hospitalization and ongoing recovery are:: Patient and daughter want him to be able to walk on his own again CMS Medicare.gov Compare Post Acute Care list provided to:: Patient Represenative (must comment) (Daughter informed about [email or web address]) Choice offered to / list presented to : Adult Children  Discharge Placement PASRR number recieved: 12/24/20            Patient chooses bed at: [facility] Patient to be transferred to facility by: Non-emergency ambulance transport Name of family member notified: Daughter Wilford Sports at the bedside Patient and family notified of of transfer: 01/04/21  Discharge Plan and Services                                   Social Determinants of Health (SDOH) Interventions  No SDOH interventions requested or needed at discharge   Readmission Risk Interventions No flowsheet data found.

## 2021-01-04 NOTE — Plan of Care (Signed)
  Problem: Activity: Goal: Risk for activity intolerance will decrease Outcome: Progressing   Problem: Pain Managment: Goal: General experience of comfort will improve Outcome: Progressing   

## 2021-01-04 NOTE — Discharge Summary (Addendum)
Physician Discharge Summary  [name] [name] TKW:[id number] DOB: 1933-05-02 DOA: 12/20/2020  PCP: Antony Contras, MD  Admit date: 12/20/2020 Discharge date: 01/04/2021  Admitted From: Home in [place] Disposition:  [place] for short term physical therapy  Recommendations for Outpatient Follow-up:  1. Follow up with PCP in 1 week. 2. Follow up with Nephrology in 1 week. 3. Take Eliquis 2.5 mg BID and Aspirin 81 mg QD.  Monitor for any bleeding. 4. Take Lasix 20 mg PRN for shortness of breath. 5. Monitor Renal Cysts per Nephrology recommendations.  Repeat ultrasound x 1 year. 6. Monitor 5 mm Lung Nodule.  Repeat Chest CT x 1 year. 7. Monitor Ascending Aortic Aneurysm 4.2 cm.  Repeat CTA x 1 year.  Home Health: None Equipment/Devices: Requested wheelchair, walker, cane, shower stool, and bedside commode.  Discharge Condition: Stable Code Status:   Code Status: Full Code Diet recommendation:  Diet Order            Diet - low sodium heart healthy           DIET SOFT Room service appropriate? Yes; Fluid consistency: Thin; Fluid restriction: 1200 mL Fluid  Diet effective now                  Brief/Interim Summary:  87 year old male from [place] with [place] of Hypertension, CAD s/p 2013 PCI, Chronic Systolic Heart Failure with EF 45-50%, Persistent Atrial Fibrillation on Eliquis, and CKD3 who presents to the ED on 4/6 s/p mechanical fall with trauma to the face and chest.  Imaging revealed bilateral nondisplaced rib fractures, and a foreign body (presumably his hearing aid) in the stomach.  His fractures were managed with pain medications and supportive care.  On 4/7, patient had an EGD which showed a non-bleeding 8 mm gastric ulcer and mild gastritis and duodenitis.  There was no foreign body seen.  On 4/14, patient allegedly passed foreign body with bowel movement.  On admission, patient was also found to have mild dizziness with positive orthostatics, hyponatremia with Na 122  mmol/L, and Creatinine 4.45 mg/dL with baseline ~ 4 mg/dL.  Nephrology was consulted and patient was managed with fluids and supportive care, and his symptoms resolved and electrolyte levels normalized.    Frequent Falls / Physical Deconditioning: - Given patient's recent acute fall with rib fractures, chronic kidney disease, and multiple medical problems with two week hospitalization - patient has developed acute physical deconditioning and decline in function.  He has limited functional mobility and activity tolerance.  He will be discharged to [facility] in [place] for short term physical therapy for reconditioning of strength and muscle as well as improving physical psycho-social functions.  He will then return to his home in [place].  Chronic Lower Back Pain: - 4/15 CT of cervical spine showed no acute fracture.  There was moderate-severe cervical spondylosis worse C3-C5. - 4/15 CT of lumbar spine showed no acute fracture.  There was multilevel DDD and mild stenosis at L4-L5. - Avoid narcotics as patient is sensitive given his renal function.  He develops drowsiness and hallucinations on most sedating medications. - Continue Lidocaine patch, Heating Pads, and Tylenol PRN. - Patient can take Gabapentin 100 mg once daily PRN for neuropathic pain.  Chronic Hypertension:  - Continue Amlodipine 5 mg daily and Coreg at home dose 25 mg BID. - Discontinue Losartan 100 mg daily given renal function. - Follow up with PCP for further BP management.  Mild Gastritis and Mild  PUD: 4/7 EGD showed a non-bleeding 8 mm gastric ulcer and mild gastritis and duodenitis.  H. Pylori was negative. - It is ok to continue Aspirin and Eliquis and monitor for any bleeding. - Continue Protonix 40 mg PO BID x 30 days.  Then take Protonix 40 mg PO QD.  Chronic Kidney Disease Stage 3B: - Creatinine is~ 4 mg/dL, which is his baseline. - Allopurinol was reduced to 100 mg daily and Eliquis was  reduced to 2.5 mg BID. - Patient will follow up with Nephrologist Dr. Hollie Salk in one week for further management.  He will then establish care with a Nephrologist in [place].  Coronary Artery Disease S/P 2013 PCI:  - Continue Aspirin, Statin, and Beta Blocker.  Peripheral Vascular Disease: - Continue Aspirin and Statin.  Chronic Diastolic Heart Failure with EF 55-60%: - Continue Coreg.   - Hold ACE/ARB due to renal function. - Patient can take Lasix 20 mg PRN for shortness of breath.  Moderate Right Sided Pleural Effusion: - Patient can take Lasix 20 mg PRN for shortness of breath.  Chronic Atrial Fibrillation: - Continue Coreg for rate control. - Continue Eliquis 2.5 mg BID (renally dosed) for anticoagulation.  Mild Thrombocytopenia:  - Platelets are ~ 130K.  Monitor while on Aspirin and Eliquis.  Routine Monitoring: 5 mm Lung Nodule: Follow up with PCP for routine monitoring.  Repeat Chest CT x 1 year. Bilateral Renal Cystic Lesions: Follow up with Nephrology for routine monitoring. 4.2 cm Ascending Aortic Aneurysm: Follow up with PCP for routine monitoring.  Repeat CTA x 1 year.   Discharge Diagnoses:  Principal Problem:   Multiple fractures of ribs, bilateral, initial encounter for closed fracture Active Problems:   HTN (hypertension)   S/P angioplasty with stent, 11/26/11 distal AVGroove LCX into OM3 with BMS ([facility]) and rescue PTCA on OM2 jailed by stent.   Hyponatremia   Falls   Acute kidney injury superimposed on chronic kidney disease ([place])   Foreign body in stomach   Iron deficiency anemia   Obesity (BMI 30-39.9)   Hypercalcemia  Consults:  Nephrology  Subjective: Patient is stable on day of discharge. Discharge Exam: Vitals:   01/04/21 0547 01/04/21 0930  BP: (!) 155/92 (!) 161/98  Pulse: 81 84  Resp: 19 18  Temp: 98.5 F (36.9 C) 98 F (36.7 C)  SpO2: 98% 98%   General: Pt is alert, awake, not in acute distress Cardiovascular: RRR, S1/S2  +, no rubs, no gallops Respiratory: CTA bilaterally, no wheezing, no rhonchi Abdominal: Soft, NT, ND, bowel sounds + Extremities: no edema, no cyanosis  Discharge Instructions  Discharge Instructions    Ambulatory referral to Nephrology   Complete by: As directed    DME Bedside commode   Complete by: As directed    Patient needs a bedside commode to treat with the following condition: Physical deconditioning   Diet - low sodium heart healthy   Complete by: As directed    Increase activity slowly   Complete by: As directed    Increase activity slowly   Complete by: As directed      Allergies as of 01/04/2021      Reactions   Hydrochlorothiazide Other (See Comments)   Exacerbates gout   Morphine And Related Other (See Comments)   Hallucinations      Medication List    STOP taking these medications   BETA CAROTENE PO   Co Q-10 100 MG Caps   losartan 100 MG tablet Commonly known as: COZAAR  omeprazole 20 MG capsule Commonly known as: PRILOSEC Replaced by: pantoprazole 40 MG tablet   VITAMIN D (CHOLECALCIFEROL) PO   vitamin E 180 MG (400 UNITS) capsule     TAKE these medications   acetaminophen 650 MG CR tablet Commonly known as: TYLENOL Take 650 mg by mouth every 8 (eight) hours as needed for pain.   allopurinol 100 MG tablet Commonly known as: ZYLOPRIM Take 1 tablet (100 mg total) by mouth daily. What changed:   medication strength  how much to take   amLODipine 5 MG tablet Commonly known as: NORVASC Take 5 mg by mouth daily.   apixaban 2.5 MG Tabs tablet Commonly known as: ELIQUIS Take 1 tablet (2.5 mg total) by mouth 2 (two) times daily. What changed:   medication strength  how much to take   aspirin EC 81 MG tablet Take 81 mg by mouth daily.   bimatoprost 0.03 % ophthalmic solution Commonly known as: LUMIGAN Place 1 drop into both eyes every evening.   carvedilol 25 MG tablet Commonly known as: COREG Take 25 mg by mouth 2 (two) times  daily with a meal.   Fish Oil 1000 MG Caps Take 1,000 mg by mouth daily.   gabapentin 100 MG capsule Commonly known as: Neurontin Take 1 capsule (100 mg total) by mouth once as needed for up to 1 dose.   lidocaine 5 % Commonly known as: LIDODERM Place 1 patch onto the skin daily. Remove & Discard patch within 12 hours or as directed by MD   MELATONIN PO Take 1 tablet by mouth at bedtime as needed.   pantoprazole 40 MG tablet Commonly known as: PROTONIX Take 1 tablet (40 mg total) by mouth 2 (two) times daily before a meal. Replaces: omeprazole 20 MG capsule   rosuvastatin 5 MG tablet Commonly known as: CRESTOR Take 5 mg by mouth See admin instructions. Take 5mg  daily on Mondays, wednesdays, and fridays   vitamin B-12 500 MCG tablet Commonly known as: CYANOCOBALAMIN Take 500 mcg by mouth daily.            Durable Medical Equipment  (From admission, onward)         Start     Ordered   01/04/21 1131  DME Cane  Once        01/04/21 1131   01/04/21 1130  DME Walker  Once       Question Answer Comment  Walker: With 5 Inch Wheels   Patient needs a walker to treat with the following condition Physical deconditioning      01/04/21 1131   01/04/21 1130  DME 3-in-1  Once        01/04/21 1131   01/04/21 1130  DME Shower stool  Once        01/04/21 1131   01/04/21 1129  DME standard manual wheelchair with seat cushion  Once       Comments: Patient suffers from physical deconditioning which impairs their ability to perform daily activities like bathing and dressing in the home.  A walker will not resolve issue with performing activities of daily living. A wheelchair will allow patient to safely perform daily activities. Patient can safely propel the wheelchair in the home or has a caregiver who can provide assistance. Length of need 6 months . Accessories: elevating leg rests (ELRs), wheel locks, extensions and anti-tippers.   01/04/21 1131   01/04/21 0000  DME Bedside  commode       Question:  Patient needs  a bedside commode to treat with the following condition  Answer:  Physical deconditioning   01/04/21 1131          Allergies  Allergen Reactions  . Hydrochlorothiazide Other (See Comments)    Exacerbates gout  . Morphine And Related Other (See Comments)    Hallucinations    The results of significant diagnostics from this hospitalization (including imaging, microbiology, ancillary and laboratory) are listed below for reference.    Microbiology: No results found for this or any previous visit (from the past 240 hour(s)).  Procedures/Studies: DG Shoulder Right  Result Date: 12/30/2020 CLINICAL DATA:  87 year old male with fall EXAM: RIGHT SHOULDER - 2+ VIEW COMPARISON:  None. FINDINGS: Glenohumeral joint is congruent. Degenerative changes at the acromioclavicular joint. No acute displaced fracture. Unremarkable appearance of the visualized thorax. No radiopaque foreign body. No focal soft tissue swelling IMPRESSION: Negative for acute bony abnormality. Degenerative changes at the [facility] joint Electronically Signed   By: Corrie Mckusick D.O.   On: 12/30/2020 10:43   DG Abd 1 View  Result Date: 12/28/2020 CLINICAL DATA:  Follow-up metallic foreign body EXAM: ABDOMEN - 1 VIEW COMPARISON:  Multiple films dating back to 12/22/2020 FINDINGS: Scattered large and small bowel gas is noted. Radiopaque foreign body is noted within the rectum similar to that seen on the prior exam. Multiple prostate brachytherapy seeds are noted. Degenerative changes of lumbar spine are seen. IMPRESSION: Foreign body within the rectum stable in appearance from the previous day. Electronically Signed   By: Inez Catalina M.D.   On: 12/28/2020 13:35   DG Abd 1 View  Result Date: 12/27/2020 CLINICAL DATA:  Evaluate for foreign body. Patient swallowed hearing aid. EXAM: ABDOMEN - 1 VIEW COMPARISON:  12/26/2020 FINDINGS: Blunting at the left costophrenic angle. Few densities at the left  lung base. The metallic foreign body has migrated into the pelvis and located in the region of the rectum. Nonobstructive bowel gas pattern. Calcification overlying the right upper quadrant of the abdomen. IMPRESSION: Metallic foreign body has migrated into the pelvis and likely within the rectum. Nonobstructive bowel gas pattern. Mild blunting at the left costophrenic angle. Findings could be related to a small effusion and atelectasis. Electronically Signed   By: Markus Daft M.D.   On: 12/27/2020 08:58   DG Abd 1 View  Result Date: 12/26/2020 CLINICAL DATA:  Ingested foreign body EXAM: ABDOMEN - 1 VIEW COMPARISON:  12/25/2020 FINDINGS: The clustered metallic densities noted on prior examination now overlies the descending colon within the left mid abdomen. Normal abdominal gas pattern. No gross free intraperitoneal gas. Brachytherapy seeds overlie the prostate gland. Vascular calcifications are seen within the pelvis. No acute bone abnormality. IMPRESSION: Metallic foreign bodies likely within the descending colon. Electronically Signed   By: Fidela Salisbury MD   On: 12/26/2020 06:02   DG Abd 1 View  Result Date: 12/25/2020 CLINICAL DATA:  Serial daily abdominal x-rays in this 87 year old patient who swallowed his hearing aids 5 days ago, as the hearing aids were not identified on EGD 3 days ago. EXAM: PORTABLE ABDOMEN-1 VIEW COMPARISON:  Abdominal x-rays 12/24/2020 and earlier. CT abdomen and pelvis 12/21/2020 FINDINGS: The foreign body (or foreign bodies) corresponding to the ingested hearing aid(s) projects over the central abdomen just to the LEFT of midline, likely in a proximal jejunal loop. Its position is unchanged since the abdominal x-ray 3 days ago. Bowel gas pattern unremarkable without evidence of obstruction or significant ileus. Moderate to large colonic stool burden  as before. IMPRESSION: Ingested hearing aid(s) projects over the central abdomen just to the LEFT of midline, likely in a  proximal jejunal loop. Its position is unchanged over the past 3 days. Electronically Signed   By: Evangeline Dakin M.D.   On: 12/25/2020 13:35   DG Abd 1 View  Result Date: 12/22/2020 CLINICAL DATA:  Rule out foreign body. May have swallowed hearing aid EXAM: ABDOMEN - 1 VIEW COMPARISON:  None. FINDINGS: Electronic device to the left of L2. This appears to be within bowel and could be in the transverse colon, less likely the stomach. Nonobstructive bowel gas pattern. Mild degenerative change lumbar spine. IMPRESSION: Electronic device to the left of L2 compatible with hearing aid, likely in the transverse colon. Electronically Signed   By: Franchot Gallo M.D.   On: 12/22/2020 11:34   CT Head Wo Contrast  Result Date: 12/21/2020 CLINICAL DATA:  Fall EXAM: CT HEAD WITHOUT CONTRAST TECHNIQUE: Contiguous axial images were obtained from the base of the skull through the vertex without intravenous contrast. COMPARISON:  None. FINDINGS: Brain: There is atrophy and chronic small vessel disease changes. No acute intracranial abnormality. Specifically, no hemorrhage, hydrocephalus, mass lesion, acute infarction, or significant intracranial injury. Vascular: No hyperdense vessel or unexpected calcification. Skull: No acute calvarial abnormality. Sinuses/Orbits: No acute findings Other: None IMPRESSION: Atrophy, chronic microvascular disease. No acute intracranial abnormality. Electronically Signed   By: Rolm Baptise M.D.   On: 12/21/2020 01:47   CT CERVICAL SPINE WO CONTRAST  Result Date: 12/30/2020 CLINICAL DATA:  Provided history: Neck pain, chronic, no prior imaging. Multiple falls, posterior neck and low back pain. EXAM: CT CERVICAL SPINE WITHOUT CONTRAST TECHNIQUE: Multidetector CT imaging of the cervical spine was performed without intravenous contrast. Multiplanar CT image reconstructions were also generated. COMPARISON:  Cervical spine CT 12/21/2020. FINDINGS: Alignment: 3 mm C4-C5 grade 1 anterolisthesis,  unchanged. Skull base and vertebrae: The basion-dental and atlanto-dental intervals are maintained.No evidence of acute fracture to the cervical spine. Soft tissues and spinal canal: No prevertebral fluid or swelling. No visible canal hematoma. Disc levels: Multilevel disc space narrowing. Most notably, there is moderate to moderately advanced disc space narrowing at C5-C6 and C6-C7. C2-C3: Small central disc protrusion. Facet arthrosis. Mild relative spinal canal narrowing. No significant bony neural foraminal narrowing. C3-C4: Broad-based central disc protrusion uncovertebral hypertrophy. Facet arthrosis. Ligamentum flavum hypertrophy. Suspected moderate spinal canal stenosis. Bilateral bony neural foraminal narrowing (severe right, moderate left). C4-C5: Grade 1 anterolisthesis. Disc uncovering with slight disc bulge. Facet arthrosis. No appreciable significant spinal canal stenosis. Bilateral bony neural foraminal narrowing (moderate left). C5-C6: Disc bulge with mild endplate spurring. Mild spinal canal stenosis. Mild left bony neural foraminal narrowing. C6-C7: Disc bulge with endplate spurring and bilateral disc osteophyte ridge/uncinate hypertrophy. Suspected mild/moderate spinal canal stenosis. Bilateral bony neural foraminal narrowing (moderate right, moderate/severe left). C7-T1: No significant disc herniation or spinal canal stenosis. Endplate spurring on the right contributes to moderate right neural foraminal narrowing. Upper chest: No consolidation within the imaged lung apices. No visible pneumothorax. IMPRESSION: No evidence of acute fracture to the cervical spine. 3 mm C4-C5 grade 1 anterolisthesis, unchanged. Cervical spondylosis with multilevel spinal canal and neural foraminal narrowing. Most notably, there is suspected moderate spinal canal stenosis at C3-C4 and suspected mild/moderate spinal canal stenosis at C6-C7. Neural foraminal narrowing is greatest bilaterally at C3-C4 (severe right,  moderate left), bilaterally at C4-C5 (moderate) and bilaterally at C6-C7 (moderate right, moderate/severe left). Electronically Signed   By: Kellie Simmering  DO   On: 12/30/2020 13:41   CT Cervical Spine Wo Contrast  Result Date: 12/21/2020 CLINICAL DATA:  Fall EXAM: CT CERVICAL SPINE WITHOUT CONTRAST TECHNIQUE: Multidetector CT imaging of the cervical spine was performed without intravenous contrast. Multiplanar CT image reconstructions were also generated. COMPARISON:  None. FINDINGS: Alignment: Slight anterolisthesis of C4 on C5 related to facet disease. Skull base and vertebrae: No acute fracture. No primary bone lesion or focal pathologic process. Soft tissues and spinal canal: No prevertebral fluid or swelling. No visible canal hematoma. Disc levels: Diffuse degenerative disc disease, most pronounced in the lower lumbar spine. Diffuse degenerative facet disease bilaterally. Upper chest: Right pleural effusion partially imaged. Other: None IMPRESSION: Degenerative changes.  No acute bony abnormality. Right pleural effusion. Electronically Signed   By: Rolm Baptise M.D.   On: 12/21/2020 01:49   CT LUMBAR SPINE WO CONTRAST  Result Date: 12/30/2020 CLINICAL DATA:  History of multiple falls.  Low back pain. EXAM: CT LUMBAR SPINE WITHOUT CONTRAST TECHNIQUE: Multidetector CT imaging of the lumbar spine was performed without intravenous contrast administration. Multiplanar CT image reconstructions were also generated. COMPARISON:  Lumbar spine CT scan 12/21/2020 FINDINGS: Segmentation: There are five lumbar type vertebral bodies. The last full intervertebral disc space is labeled L5-S1. Alignment: Normal Vertebrae: Age related osteoporosis. No acute lumbar fracture. No worrisome bone lesions. The pedicles and facets are intact. No pars defects. Remote healed left L2 and L3 transverse process fractures. Paraspinal and other soft tissues: No significant paraspinal or retroperitoneal findings. Advanced atherosclerotic  calcifications involving the aorta and branch vessels. There are numerous bilateral renal cysts. No retroperitoneal mass. Disc levels: Multilevel disc disease and facet disease with bulging discs but no large disc protrusions, significant spinal or foraminal stenosis. Diffuse bulging annulus and mild ligamentum flavum thickening at L4-5 with mild spinal and bilateral lateral recess stenosis. IMPRESSION: 1. Normal alignment of the lumbar vertebral bodies and no acute fracture. 2. Remote healed left L2 and L3 transverse process fractures. 3. Multilevel disc disease and facet disease but no large disc protrusions, significant spinal or foraminal stenosis. 4. Mild spinal and bilateral lateral recess stenosis at L4-5. 5. Advanced atherosclerotic calcifications involving the aorta and branch vessels. 6. Aortic atherosclerosis. Aortic Atherosclerosis (ICD10-I70.0). Electronically Signed   By: Marijo Sanes M.D.   On: 12/30/2020 14:28   CT T-SPINE NO CHARGE  Result Date: 12/21/2020 CLINICAL DATA:  Acute pain due to trauma. Bilateral shoulder pain. Possible swallowed foreign body. EXAM: CT CHEST, ABDOMEN AND PELVIS WITHOUT CONTRAST CT THORACIC AND LUMBAR SPINE WITHOUT CONTRAST TECHNIQUE: Multidetector CT imaging of the chest, abdomen and pelvis was performed following the standard protocol without IV contrast. Multiplanar CT images of the thoracic and lumbar spine were reconstructed from contemporary CT of the Chest, Abdomen, and Pelvis COMPARISON:  None. FINDINGS: CT CHEST FINDINGS Cardiovascular: Atherosclerotic changes are noted of the thoracic aorta. There is an ascending thoracic aortic aneurysm measuring approximately 4.2 cm. The heart size is enlarged. Coronary artery calcifications are noted. There is a trace pericardial effusion. The intracardiac blood pool is hypodense relative to the adjacent myocardium consistent with anemia. Mediastinum/Nodes: -- No mediastinal lymphadenopathy. -- No hilar lymphadenopathy. --  No axillary lymphadenopathy. -- No supraclavicular lymphadenopathy. -- Normal thyroid gland where visualized. -  Unremarkable esophagus. Lungs/Pleura: There is a moderate-sized right-sided pleural effusion. There is a small left-sided pleural effusion. There are prominent interstitial lung markings bilaterally. No pneumothorax. There are few scattered ground-glass airspace opacities bilaterally. There is a 5 mm pulmonary  nodule in the left upper lobe (axial series 6, image 58). Musculoskeletal: There are acute to subacute appearing anterior rib fractures on the right involving the third and fourth ribs. There appears to be a nondisplaced acute fracture involving the anterior second rib on the left. There are old healed anterior left-sided rib fractures. There are healing fractures involving the left seventh eighth ribs anteriorly. CT ABDOMEN PELVIS FINDINGS Hepatobiliary: Hepatic cysts are noted. Normal gallbladder.There is no biliary ductal dilation. Pancreas: Normal contours without ductal dilatation. No peripancreatic fluid collection. Spleen: Unremarkable. Adrenals/Urinary Tract: --Adrenal glands: Unremarkable. --Right kidney/ureter: Multiple complex and simple appearing cysts are noted involving the right kidney. --Left kidney/ureter: Multiple complex and simple appearing cysts are noted on the patient's left. There is an indeterminate exophytic hyperdense nodule rising from the interpolar region of the left kidney measuring approximately 2.5 cm. This nodule contains multiple calcifications (axial series 3, image 72). --Urinary bladder: Unremarkable. Stomach/Bowel: --Stomach/Duodenum: There is a metallic foreign body in the gastric body measuring approximately 2.1 cm (axial series 3, image 68). --Small bowel: There appears to be mild wall thickening and possible adjacent fat stranding involving the proximal duodenum. --Colon: Rectosigmoid diverticulosis without acute inflammation. --Appendix: Normal.  Vascular/Lymphatic: Atherosclerotic calcification is present within the non-aneurysmal abdominal aorta, without hemodynamically significant stenosis. --No retroperitoneal lymphadenopathy. --No mesenteric lymphadenopathy. --No pelvic or inguinal lymphadenopathy. Reproductive: Brachytherapy beads are noted Other: There is nonspecific retroperitoneal fluid along the right psoas muscle. There is a fat containing umbilical hernia. Musculoskeletal. No acute displaced fractures. IMPRESSION: 1. There are bilateral nondisplaced anterior rib fractures without evidence for pneumothorax. 2. There is a metallic foreign body in the gastric body measuring approximately 2.1 cm. This may represent the reported swallowed hearing aid. 3. There appears to be mild wall thickening and possible adjacent fat stranding involving the proximal duodenum. This may represent duodenitis/peptic ulcer disease versus pancreatitis. There is a small volume of retroperitoneal free fluid is felt to be related. 4. There is a moderate-sized right-sided pleural effusion and a small left-sided pleural effusion. 5. There is a 5 mm pulmonary nodule in the left upper lobe. No follow-up needed if patient is low-risk. Non-contrast chest CT can be considered in 12 months if patient is high-risk. This recommendation follows the consensus statement: Guidelines for Management of Incidental Pulmonary Nodules Detected on CT Images: From the Fleischner Society 2017; Radiology 2017; 284:228-243. 6. There are indeterminate bilateral renal nodules favored to represent hemorrhagic or proteinaceous cysts. Follow-up with a nonemergent outpatient renal ultrasound is recommended. 7. Cardiomegaly and coronary artery calcifications. There are findings suggestive of volume overload and developing pulmonary edema. 8. Anemia. 9. Ascending thoracic aortic aneurysm measuring 4.2 cm. Recommend annual imaging followup by CTA or MRA. This recommendation follows 2010  ACCF/AHA/AATS/ACR/ASA/SCA/SCAI/SIR/STS/SVM Guidelines for the Diagnosis and Management of Patients with Thoracic Aortic Disease. Circulation. 2010; 121: M353-I144. Aortic aneurysm NOS (ICD10-I71.9) 10. No acute fracture involving the thoracic or lumbar spine. Aortic Atherosclerosis (ICD10-I70.0). Electronically Signed   By: Constance Holster M.D.   On: 12/21/2020 02:31   CT L-SPINE NO CHARGE  Result Date: 12/21/2020 CLINICAL DATA:  Acute pain due to trauma. Bilateral shoulder pain. Possible swallowed foreign body. EXAM: CT CHEST, ABDOMEN AND PELVIS WITHOUT CONTRAST CT THORACIC AND LUMBAR SPINE WITHOUT CONTRAST TECHNIQUE: Multidetector CT imaging of the chest, abdomen and pelvis was performed following the standard protocol without IV contrast. Multiplanar CT images of the thoracic and lumbar spine were reconstructed from contemporary CT of the Chest, Abdomen, and Pelvis COMPARISON:  None.  FINDINGS: CT CHEST FINDINGS Cardiovascular: Atherosclerotic changes are noted of the thoracic aorta. There is an ascending thoracic aortic aneurysm measuring approximately 4.2 cm. The heart size is enlarged. Coronary artery calcifications are noted. There is a trace pericardial effusion. The intracardiac blood pool is hypodense relative to the adjacent myocardium consistent with anemia. Mediastinum/Nodes: -- No mediastinal lymphadenopathy. -- No hilar lymphadenopathy. -- No axillary lymphadenopathy. -- No supraclavicular lymphadenopathy. -- Normal thyroid gland where visualized. -  Unremarkable esophagus. Lungs/Pleura: There is a moderate-sized right-sided pleural effusion. There is a small left-sided pleural effusion. There are prominent interstitial lung markings bilaterally. No pneumothorax. There are few scattered ground-glass airspace opacities bilaterally. There is a 5 mm pulmonary nodule in the left upper lobe (axial series 6, image 58). Musculoskeletal: There are acute to subacute appearing anterior rib fractures on the  right involving the third and fourth ribs. There appears to be a nondisplaced acute fracture involving the anterior second rib on the left. There are old healed anterior left-sided rib fractures. There are healing fractures involving the left seventh eighth ribs anteriorly. CT ABDOMEN PELVIS FINDINGS Hepatobiliary: Hepatic cysts are noted. Normal gallbladder.There is no biliary ductal dilation. Pancreas: Normal contours without ductal dilatation. No peripancreatic fluid collection. Spleen: Unremarkable. Adrenals/Urinary Tract: --Adrenal glands: Unremarkable. --Right kidney/ureter: Multiple complex and simple appearing cysts are noted involving the right kidney. --Left kidney/ureter: Multiple complex and simple appearing cysts are noted on the patient's left. There is an indeterminate exophytic hyperdense nodule rising from the interpolar region of the left kidney measuring approximately 2.5 cm. This nodule contains multiple calcifications (axial series 3, image 72). --Urinary bladder: Unremarkable. Stomach/Bowel: --Stomach/Duodenum: There is a metallic foreign body in the gastric body measuring approximately 2.1 cm (axial series 3, image 68). --Small bowel: There appears to be mild wall thickening and possible adjacent fat stranding involving the proximal duodenum. --Colon: Rectosigmoid diverticulosis without acute inflammation. --Appendix: Normal. Vascular/Lymphatic: Atherosclerotic calcification is present within the non-aneurysmal abdominal aorta, without hemodynamically significant stenosis. --No retroperitoneal lymphadenopathy. --No mesenteric lymphadenopathy. --No pelvic or inguinal lymphadenopathy. Reproductive: Brachytherapy beads are noted Other: There is nonspecific retroperitoneal fluid along the right psoas muscle. There is a fat containing umbilical hernia. Musculoskeletal. No acute displaced fractures. IMPRESSION: 1. There are bilateral nondisplaced anterior rib fractures without evidence for  pneumothorax. 2. There is a metallic foreign body in the gastric body measuring approximately 2.1 cm. This may represent the reported swallowed hearing aid. 3. There appears to be mild wall thickening and possible adjacent fat stranding involving the proximal duodenum. This may represent duodenitis/peptic ulcer disease versus pancreatitis. There is a small volume of retroperitoneal free fluid is felt to be related. 4. There is a moderate-sized right-sided pleural effusion and a small left-sided pleural effusion. 5. There is a 5 mm pulmonary nodule in the left upper lobe. No follow-up needed if patient is low-risk. Non-contrast chest CT can be considered in 12 months if patient is high-risk. This recommendation follows the consensus statement: Guidelines for Management of Incidental Pulmonary Nodules Detected on CT Images: From the Fleischner Society 2017; Radiology 2017; 284:228-243. 6. There are indeterminate bilateral renal nodules favored to represent hemorrhagic or proteinaceous cysts. Follow-up with a nonemergent outpatient renal ultrasound is recommended. 7. Cardiomegaly and coronary artery calcifications. There are findings suggestive of volume overload and developing pulmonary edema. 8. Anemia. 9. Ascending thoracic aortic aneurysm measuring 4.2 cm. Recommend annual imaging followup by CTA or MRA. This recommendation follows 2010 ACCF/AHA/AATS/ACR/ASA/SCA/SCAI/SIR/STS/SVM Guidelines for the Diagnosis and Management of Patients with Thoracic  Aortic Disease. Circulation. 2010; 121: B147-W295. Aortic aneurysm NOS (ICD10-I71.9) 10. No acute fracture involving the thoracic or lumbar spine. Aortic Atherosclerosis (ICD10-I70.0). Electronically Signed   By: Constance Holster M.D.   On: 12/21/2020 02:31   DG Shoulder Left  Result Date: 12/30/2020 CLINICAL DATA:  87 year old male with fall and pain EXAM: LEFT SHOULDER - 2+ VIEW COMPARISON:  None. FINDINGS: Glenohumeral joint is congruent. Degenerative changes of  the acromioclavicular joint. No acute displaced fracture. No radiopaque foreign body. No focal soft tissue swelling. Visualized thorax unremarkable. IMPRESSION: Negative for acute bony abnormality. Degenerative changes of the [facility] joint. Electronically Signed   By: Corrie Mckusick D.O.   On: 12/30/2020 10:44   DG Abd 2 Views  Result Date: 12/24/2020 CLINICAL DATA:  Apparent foreign body in elementary tract EXAM: ABDOMEN - 2 VIEW COMPARISON:  December 23, 2020 and December 22, 2020 FINDINGS: Supine and upright images obtained. There is again noted a metallic foreign body, likely a hearing assist device slightly to the left of midline, unchanged in position, likely in the transverse colon. There is air throughout bowel. There is no appreciable bowel dilatation or air-fluid level to suggest bowel obstruction. No evident free air. Seed implants are noted in the prostate region. IMPRESSION: Apparent hearing assist device in mid abdominal regions slightly to the left of midline, likely in transverse colon, unchanged in position since 2 days prior. No bowel obstruction or free air is evident. Seed implants in prostate. Comment: This radiopaque foreign body appears on current examination to be located inferior to the stomach. Electronically Signed   By: Lowella Grip III M.D.   On: 12/24/2020 12:15   DG Abd Portable 1V  Result Date: 12/23/2020 CLINICAL DATA:  Gastric foreign body, may have swallowed hearing aid EXAM: PORTABLE ABDOMEN - 1 VIEW COMPARISON:  12/22/2020; correlation CT chest abdomen 12/21/2020 FINDINGS: Electronic components of a foreign body again identified LEFT paraspinal at L2-L3, unchanged. This projects over gastric antrum and transverse colon. Bowel gas pattern normal. Brachytherapy seed implants at prostate bed. Bones demineralized with degenerative changes lumbar spine. No urinary tract calcification. Atherosclerotic calcifications in the iliac systems. IMPRESSION: Persistent electronic foreign body LEFT  paraspinal at L2-3 consistent with ingested hearing aid. This projects over the gastric antrum and the transverse colon and could be within either, though the lack of interval change in position since the previous study suggests gastric retention. Electronically Signed   By: Lavonia Dana M.D.   On: 12/23/2020 08:45   ECHOCARDIOGRAM COMPLETE  Result Date: 12/22/2020    ECHOCARDIOGRAM REPORT   Patient Name:   ZAKARIE Fawbush Date of Exam: 12/22/2020 Medical Rec #:  [id number]     Height:       72.0 in Accession #:    [id number]    Weight:       253.5 lb Date of Birth:  1932/10/19    BSA:          2.357 m Patient Age:    43 years      BP:           121/69 mmHg Patient Gender: M             HR:           51 bpm. 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 CAD and Previous Myocardial Infarction,                 Abnormal ECG, Arrythmias:Atrial Fibrillation,                 Signs/Symptoms:Chest Pain; Risk Factors:Hypertension.  Sonographer:    Roseanna Rainbow RDCS Referring Phys: [id number] RONDELL A SMITH  Sonographer Comments: Technically difficult study due to poor echo windows. IMPRESSIONS  1. Left ventricular ejection fraction, by estimation, is 55 to 60%. The left ventricle has normal function. The left ventricle has no regional wall motion abnormalities. There is moderate left ventricular hypertrophy. Left ventricular diastolic parameters are indeterminate.  2. Right ventricular systolic function is normal. The right ventricular size is normal. There is normal pulmonary artery systolic pressure.  3. Left atrial size was moderately dilated.  4. Right atrial size was mildly dilated.  5. The pericardial effusion is anterior to the right ventricle.  6.  The mitral valve is degenerative. Mild mitral valve regurgitation. No evidence of mitral stenosis. Moderate mitral annular calcification.  7. The aortic valve is tricuspid. There is moderate calcification of the aortic valve. Aortic valve regurgitation is not visualized. Mild to moderate aortic valve sclerosis/calcification is present, without any evidence of aortic stenosis.  8. There is mild dilatation of the ascending aorta, measuring 38 mm.  9. The inferior vena cava is normal in size with greater than 50% respiratory variability, suggesting right atrial pressure of 3 mmHg. FINDINGS  Left Ventricle: Left ventricular ejection fraction, by estimation, is 55 to 60%. The left ventricle has normal function. The left ventricle has no regional wall motion abnormalities. The left ventricular internal cavity size was normal in size. There is  moderate left ventricular hypertrophy. Left ventricular diastolic parameters are indeterminate. Right Ventricle: The right ventricular size is normal. No increase in right ventricular wall thickness. Right ventricular systolic function is normal. There is normal pulmonary artery systolic pressure. The tricuspid regurgitant velocity is 2.46 m/s, and  with an assumed right atrial pressure of 8 mmHg, the estimated right ventricular systolic pressure is 02.4 mmHg. Left Atrium: Left atrial size was moderately dilated. Right Atrium: Right atrial size was mildly dilated. Pericardium: Trivial pericardial effusion is present. The pericardial effusion is anterior to the right ventricle. Mitral Valve: The mitral valve is degenerative in appearance. There is mild thickening of the mitral valve leaflet(s). There is mild calcification of the mitral valve leaflet(s). Moderate mitral annular calcification. Mild mitral valve regurgitation. No evidence of mitral valve stenosis. Tricuspid Valve: The tricuspid valve is normal in structure. Tricuspid valve regurgitation is trivial. No evidence of  tricuspid stenosis. Aortic Valve: The aortic valve is tricuspid. There is moderate calcification of the aortic valve. Aortic valve regurgitation is not visualized. Mild to moderate aortic valve sclerosis/calcification is present, without any evidence of aortic stenosis. Pulmonic Valve: The pulmonic valve was normal in structure. Pulmonic valve regurgitation is not visualized. No evidence of pulmonic stenosis. Aorta: The aortic root is normal in size and structure. There is mild dilatation of the ascending aorta, measuring 38 mm. Venous: The inferior vena cava is normal in size with greater than 50% respiratory variability, suggesting right atrial pressure of 3 mmHg. IAS/Shunts: No atrial level shunt detected by color flow Doppler.  LEFT VENTRICLE PLAX 2D  LVIDd:         4.90 cm      Diastology LVIDs:         3.50 cm      LV e' medial:   7.07 cm/s LV PW:         1.40 cm      LV E/e' medial: 8.8 LV IVS:        1.50 cm LVOT diam:     2.50 cm LV SV:         131 LV SV Index:   56 LVOT Area:     4.91 cm  LV Volumes (MOD) LV vol d, MOD A2C: 65.0 ml LV vol d, MOD A4C: 113.0 ml LV vol s, MOD A2C: 37.2 ml LV vol s, MOD A4C: 63.2 ml LV SV MOD A2C:     27.8 ml LV SV MOD A4C:     113.0 ml LV SV MOD BP:      39.3 ml RIGHT VENTRICLE             IVC RV S prime:     11.30 cm/s  IVC diam: 3.10 cm TAPSE (M-mode): 2.5 cm LEFT ATRIUM             Index       RIGHT ATRIUM           Index LA diam:        4.30 cm 1.82 cm/m  RA Area:     27.90 cm LA Vol (A2C):   95.6 ml 40.57 ml/m RA Volume:   87.20 ml  37.00 ml/m LA Vol (A4C):   79.0 ml 33.52 ml/m LA Biplane Vol: 90.6 ml 38.45 ml/m  AORTIC VALVE LVOT Vmax:   119.00 cm/s LVOT Vmean:  78.400 cm/s LVOT VTI:    0.267 m  AORTA Ao Root diam: 3.60 cm Ao Asc diam:  3.80 cm MITRAL VALVE               TRICUSPID VALVE MV Area (PHT): 4.68 cm    TR Peak grad:   24.2 mmHg MV Decel Time: 162 msec    TR Vmax:        246.00 cm/s MV E velocity: 62.35 cm/s                            SHUNTS                             Systemic VTI:  0.27 m                            Systemic Diam: 2.50 cm Jenkins Rouge MD Electronically signed by Jenkins Rouge MD Signature Date/Time: 12/22/2020/4:14:48 PM    Final    CT CHEST ABDOMEN PELVIS WO CONTRAST  Result Date: 12/21/2020 CLINICAL DATA:  Acute pain due to trauma. Bilateral shoulder pain. Possible swallowed foreign body. EXAM: CT CHEST, ABDOMEN AND PELVIS WITHOUT CONTRAST CT THORACIC AND LUMBAR SPINE WITHOUT CONTRAST TECHNIQUE: Multidetector CT imaging of the chest, abdomen and pelvis was performed following the standard protocol without IV contrast. Multiplanar CT images of the thoracic and lumbar spine were reconstructed from contemporary CT of the Chest, Abdomen, and Pelvis COMPARISON:  None. FINDINGS: CT CHEST FINDINGS Cardiovascular: Atherosclerotic changes are noted of the thoracic aorta. There is an ascending thoracic aortic aneurysm measuring approximately 4.2 cm.  The heart size is enlarged. Coronary artery calcifications are noted. There is a trace pericardial effusion. The intracardiac blood pool is hypodense relative to the adjacent myocardium consistent with anemia. Mediastinum/Nodes: -- No mediastinal lymphadenopathy. -- No hilar lymphadenopathy. -- No axillary lymphadenopathy. -- No supraclavicular lymphadenopathy. -- Normal thyroid gland where visualized. -  Unremarkable esophagus. Lungs/Pleura: There is a moderate-sized right-sided pleural effusion. There is a small left-sided pleural effusion. There are prominent interstitial lung markings bilaterally. No pneumothorax. There are few scattered ground-glass airspace opacities bilaterally. There is a 5 mm pulmonary nodule in the left upper lobe (axial series 6, image 58). Musculoskeletal: There are acute to subacute appearing anterior rib fractures on the right involving the third and fourth ribs. There appears to be a nondisplaced acute fracture involving the anterior second rib on the left. There are old healed  anterior left-sided rib fractures. There are healing fractures involving the left seventh eighth ribs anteriorly. CT ABDOMEN PELVIS FINDINGS Hepatobiliary: Hepatic cysts are noted. Normal gallbladder.There is no biliary ductal dilation. Pancreas: Normal contours without ductal dilatation. No peripancreatic fluid collection. Spleen: Unremarkable. Adrenals/Urinary Tract: --Adrenal glands: Unremarkable. --Right kidney/ureter: Multiple complex and simple appearing cysts are noted involving the right kidney. --Left kidney/ureter: Multiple complex and simple appearing cysts are noted on the patient's left. There is an indeterminate exophytic hyperdense nodule rising from the interpolar region of the left kidney measuring approximately 2.5 cm. This nodule contains multiple calcifications (axial series 3, image 72). --Urinary bladder: Unremarkable. Stomach/Bowel: --Stomach/Duodenum: There is a metallic foreign body in the gastric body measuring approximately 2.1 cm (axial series 3, image 68). --Small bowel: There appears to be mild wall thickening and possible adjacent fat stranding involving the proximal duodenum. --Colon: Rectosigmoid diverticulosis without acute inflammation. --Appendix: Normal. Vascular/Lymphatic: Atherosclerotic calcification is present within the non-aneurysmal abdominal aorta, without hemodynamically significant stenosis. --No retroperitoneal lymphadenopathy. --No mesenteric lymphadenopathy. --No pelvic or inguinal lymphadenopathy. Reproductive: Brachytherapy beads are noted Other: There is nonspecific retroperitoneal fluid along the right psoas muscle. There is a fat containing umbilical hernia. Musculoskeletal. No acute displaced fractures. IMPRESSION: 1. There are bilateral nondisplaced anterior rib fractures without evidence for pneumothorax. 2. There is a metallic foreign body in the gastric body measuring approximately 2.1 cm. This may represent the reported swallowed hearing aid. 3. There  appears to be mild wall thickening and possible adjacent fat stranding involving the proximal duodenum. This may represent duodenitis/peptic ulcer disease versus pancreatitis. There is a small volume of retroperitoneal free fluid is felt to be related. 4. There is a moderate-sized right-sided pleural effusion and a small left-sided pleural effusion. 5. There is a 5 mm pulmonary nodule in the left upper lobe. No follow-up needed if patient is low-risk. Non-contrast chest CT can be considered in 12 months if patient is high-risk. This recommendation follows the consensus statement: Guidelines for Management of Incidental Pulmonary Nodules Detected on CT Images: From the Fleischner Society 2017; Radiology 2017; 284:228-243. 6. There are indeterminate bilateral renal nodules favored to represent hemorrhagic or proteinaceous cysts. Follow-up with a nonemergent outpatient renal ultrasound is recommended. 7. Cardiomegaly and coronary artery calcifications. There are findings suggestive of volume overload and developing pulmonary edema. 8. Anemia. 9. Ascending thoracic aortic aneurysm measuring 4.2 cm. Recommend annual imaging followup by CTA or MRA. This recommendation follows 2010 ACCF/AHA/AATS/ACR/ASA/SCA/SCAI/SIR/STS/SVM Guidelines for the Diagnosis and Management of Patients with Thoracic Aortic Disease. Circulation. 2010; 121: X505-W979. Aortic aneurysm NOS (ICD10-I71.9) 10. No acute fracture involving the thoracic or lumbar spine. Aortic Atherosclerosis (ICD10-I70.0). Electronically  Signed   By: Constance Holster M.D.   On: 12/21/2020 02:31    Labs: BNP (last 3 results) Recent Labs    12/21/20 1203  BNP 315.4*   Basic Metabolic Panel: Recent Labs  Lab 12/31/20 0237 01/01/21 0552 01/02/21 0734 01/03/21 0707 01/04/21 0553  NA 136 138 136 136 138  K 4.4 4.1 4.0 4.1 4.2  CL 101 103 103 105 104  CO2 26 26 26 24 24   GLUCOSE 106* 102* 90 92 97  BUN 67* 63* 61* 62* 63*  CREATININE 3.93* 4.08* 4.07*  3.96* 3.98*  CALCIUM 9.7 9.5 9.7 9.7 10.0   Liver Function Tests: Recent Labs  Lab 01/01/21 0552  AST 18  ALT 23  ALKPHOS 81  BILITOT 0.7  PROT 5.4*  ALBUMIN 2.7*   CBC: Recent Labs  Lab 12/31/20 0237 01/01/21 0552 01/02/21 0734 01/03/21 0707 01/04/21 0553  WBC 10.3 9.5 10.1 8.8 10.2  HGB 9.5* 9.2* 9.7* 9.2* 9.0*  HCT 28.7* 28.1* 29.5* 27.8* 27.2*  MCV 93.8 94.0 93.9 92.7 93.8  PLT 120* 127* 137* 134* 131*   CBG: Urinalysis    Component Value Date/Time   COLORURINE STRAW (A) 12/25/2020 1223   APPEARANCEUR CLEAR 12/25/2020 1223   LABSPEC 1.009 12/25/2020 1223   PHURINE 5.0 12/25/2020 1223   GLUCOSEU NEGATIVE 12/25/2020 1223   [place] 12/25/2020 1223   [place] 12/25/2020 1223   KETONESUR NEGATIVE 12/25/2020 1223   PROTEINUR NEGATIVE 12/25/2020 1223   UROBILINOGEN 0.2 11/23/2011 1335   NITRITE NEGATIVE 12/25/2020 1223   LEUKOCYTESUR NEGATIVE 12/25/2020 1223    Time coordinating discharge: 60 minutes  SIGNED: George Hugh, MD  [facility] 01/04/2021, 1:06 PM  If 7PM-7AM, please contact night-coverage [email or web address]

## 2021-01-04 NOTE — Progress Notes (Signed)
Physical Therapy Treatment Patient Details Name: [name] [name] MRN: [id number] DOB: 1932/10/17 Today's Date: 01/04/2021    History of Present Illness Pt adm 4/5 with falls and hyponatremia. Pt found to have bilateral nondisplaced anterior rib fx's. Pt also inadvertantly had swallowed his hearing aide. Hospital course complicated by postural hypotension; GI to retrieve hearing aide. PMH - HTN, afib, cad, chf, ckd, prostate CA    PT Comments    Continuing work on functional mobility and activity tolerance; Session focused on progressive ambulation with very nice distance, and less L knee buckle; Occasionally L knee pushing back into hyperextension in stance; Overall progressing well; Anticipate continuing good progress at post-acute rehabilitation.    Follow Up Recommendations  SNF     Equipment Recommendations  Wheelchair (measurements PT);Wheelchair cushion (measurements PT)    Recommendations for Other Services       Precautions / Restrictions Precautions Precautions: Fall Precaution Comments: Watch for postural hypotension; for amb, keep the chair follow VERY close; son reports about 5 falls in the past 3 months Restrictions Other Position/Activity Restrictions: rib fractures    Mobility  Bed Mobility Overal bed mobility: Needs Assistance Bed Mobility: Supine to Sit     Supine to sit: Min guard;Min assist     General bed mobility comments: Initially making good smooth movements toward getting up to EOB on the R; Once in sitting, Posterior bias sets in and then pt requires min assist until he can catch his balance    Transfers Overall transfer level: Needs assistance Equipment used: Rolling walker (2 wheeled) Transfers: Sit to/from Stand Sit to Stand: Mod assist;Min assist Stand pivot transfers: Mod assist;+2 safety/equipment       General transfer comment: Light mod assist to push up to stand; cues for hand placement and safety; Needs a very close chair follow, as  he has little to no control of his descent to sit  Ambulation/Gait Ambulation/Gait assistance: Min assist;+2 physical assistance;+2 safety/equipment Gait Distance (Feet): 100 Feet (60, seated rest break, 40) Assistive device: Rolling walker (2 wheeled) Gait Pattern/deviations: Decreased step length - right;Decreased step length - left;Decreased stride length;Trunk flexed     General Gait Details: Cues to activate quad and gluteals for hip and knee stability in stance; noting far less L knee buckling today; still, pt tending for L knee to push back into hyperextension in stance   Stairs             Wheelchair Mobility    Modified Rankin (Stroke Patients Only)       Balance     Sitting balance-Leahy Scale: Fair Sitting balance - Comments: except needed min assist for psoterior lean until he found his sitting balance                                    Cognition Arousal/Alertness: Awake/alert Behavior During Therapy: WFL for tasks assessed/performed Overall Cognitive Status: Within Functional Limits for tasks assessed (for simple mobilty tasks)                                 General Comments: pt requiring repeated verbal cues and occasional tactile cuing for mobility, suspect at least partially due to [facility]      Exercises      General Comments        Pertinent Vitals/Pain Pain Assessment: Faces Faces Pain Scale: Hurts  a little bit Pain Location: b shld Pain Descriptors / Indicators: Aching Pain Intervention(s): Monitored during session    Home Living                      Prior Function            PT Goals (current goals can now be found in the care plan section) Acute Rehab PT Goals Patient Stated Goal: get stronger PT Goal Formulation: With patient/family Time For Goal Achievement: 01/05/21 Potential to Achieve Goals: Good Progress towards PT goals: Progressing toward goals    Frequency    Min 2X/week       PT Plan Current plan remains appropriate    Co-evaluation              AM-PAC PT "6 Clicks" Mobility   Outcome Measure  Help needed turning from your back to your side while in a flat bed without using bedrails?: A Little Help needed moving from lying on your back to sitting on the side of a flat bed without using bedrails?: A Lot Help needed moving to and from a bed to a chair (including a wheelchair)?: A Lot Help needed standing up from a chair using your arms (e.g., wheelchair or bedside chair)?: A Lot Help needed to walk in hospital room?: A Lot Help needed climbing 3-5 steps with a railing? : Total 6 Click Score: 12    End of Session Equipment Utilized During Treatment: Gait belt Activity Tolerance: Patient tolerated treatment well Patient left: in chair;with call bell/phone within reach;with chair alarm set Nurse Communication: Mobility status PT Visit Diagnosis: Other abnormalities of gait and mobility (R26.89);Muscle weakness (generalized) (M62.81);History of falling (Z91.81)     Time: 1517-6160 PT Time Calculation (min) (ACUTE ONLY): 23 min  Charges:  $Gait Training: 23-37 mins                     Roney Marion, [place]  Acute Rehabilitation Services Pager [phone] Office [place] 01/04/2021, 4:12 PM

## 2021-01-16 ENCOUNTER — Other Ambulatory Visit: Payer: Self-pay | Admitting: Orthopedic Surgery

## 2021-01-16 ENCOUNTER — Other Ambulatory Visit (HOSPITAL_COMMUNITY): Payer: Self-pay | Admitting: Orthopedic Surgery

## 2021-01-16 DIAGNOSIS — M5416 Radiculopathy, lumbar region: Secondary | ICD-10-CM

## 2021-01-16 DIAGNOSIS — G959 Disease of spinal cord, unspecified: Secondary | ICD-10-CM

## 2021-01-24 ENCOUNTER — Other Ambulatory Visit: Payer: Self-pay

## 2021-01-24 ENCOUNTER — Ambulatory Visit (HOSPITAL_COMMUNITY)
Admission: RE | Admit: 2021-01-24 | Discharge: 2021-01-24 | Disposition: A | Discharge status: Home / Self Care | Payer: Medicare Other | Source: Ambulatory Visit | Attending: Orthopedic Surgery | Admitting: Orthopedic Surgery

## 2021-01-24 DIAGNOSIS — G959 Disease of spinal cord, unspecified: Secondary | ICD-10-CM | POA: Insufficient documentation

## 2021-01-24 DIAGNOSIS — M5416 Radiculopathy, lumbar region: Secondary | ICD-10-CM

## 2021-01-24 IMAGING — MR MR CERVICAL SPINE W/O CM
6 series · 38 of 48 positions shown · non-contrast
Comparison: [DATE] and prior.

CLINICAL DATA: Neck pain, prior falls.

EXAM:
MRI CERVICAL SPINE WITHOUT CONTRAST
TECHNIQUE: Multiplanar, multisequence MR imaging of the cervical spine was
performed. No intravenous contrast was administered.

[Series 9: T1 · sagittal · 3.0mm · 0.69mm/px · 6 of 15 slices shown]
[im 1/15]
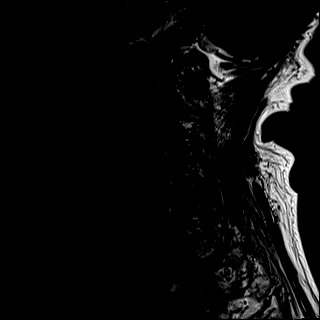
[im 3/15]
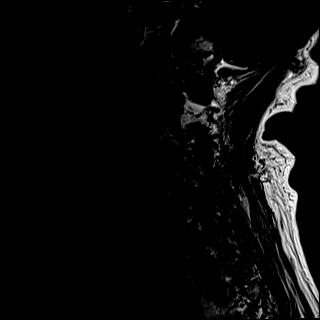
[im 6/15]
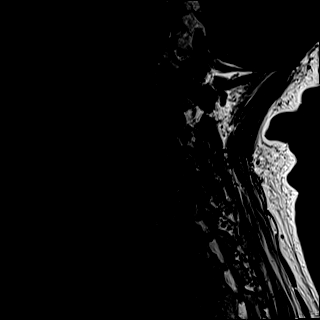
[im 9/15]
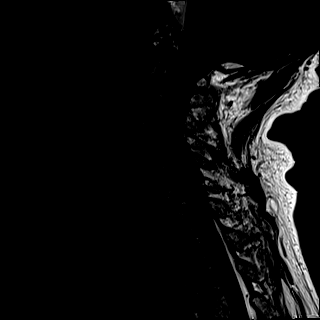
[im 12/15]
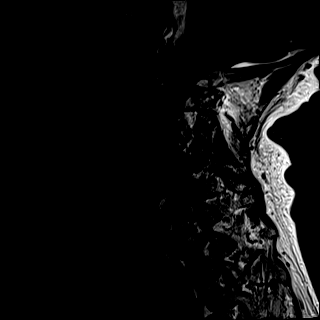
[im 15/15]
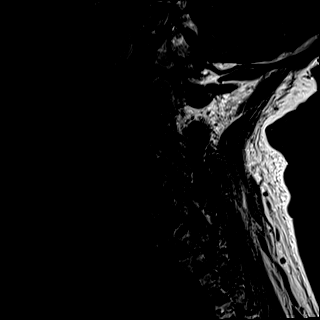

[Series 10: T2 · sagittal · 3.0mm · 0.69mm/px · 6 of 15 slices shown (1 of 2)]
[im 1/15]
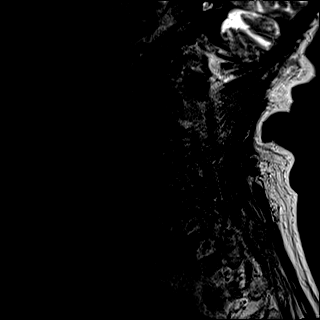
[im 3/15]
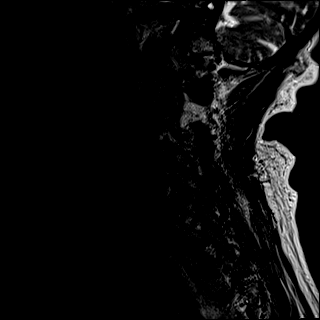
[im 6/15]
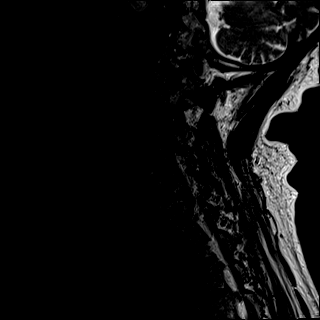
[im 9/15]
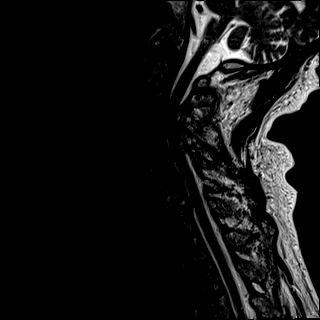
[im 12/15]
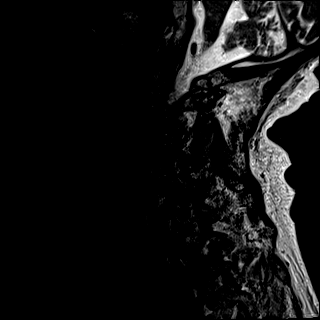
[im 15/15]
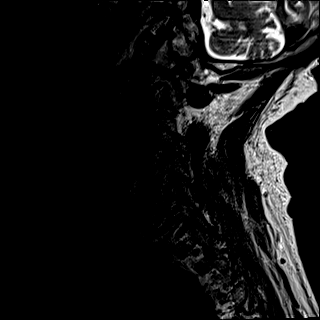

[Series 11: STIR · sagittal · 3.0mm · 0.86mm/px · 5 of 15 slices shown]
[im 1/15]
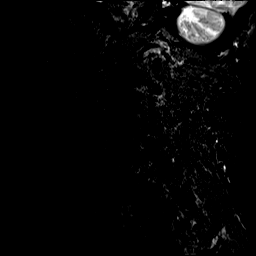
[im 4/15]
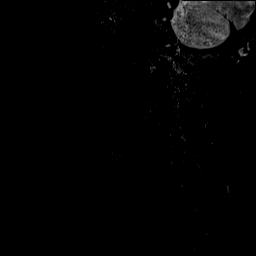
[im 8/15]
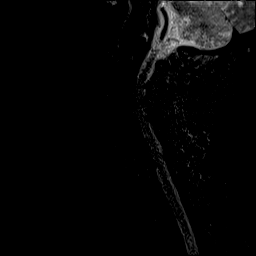
[im 11/15]
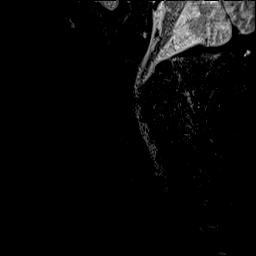
[im 15/15]
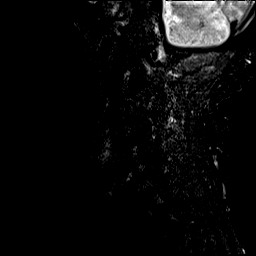

[Series 12: T2 · axial · 3.0mm · 0.70mm/px · z∈[-81,+28]mm · 9 of 36 slices shown (2 of 2)]
[im 1/36]
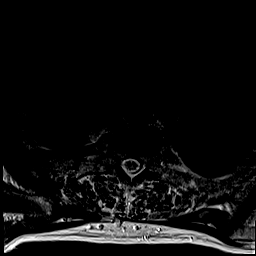
[im 6/36]
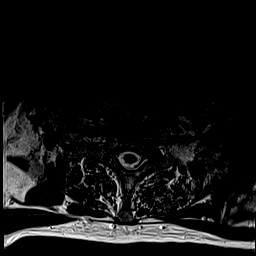
[im 12/36]
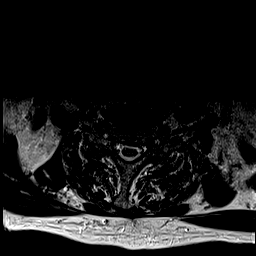
[im 15/36]
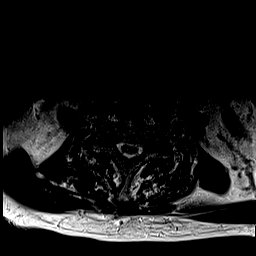
[im 18/36]
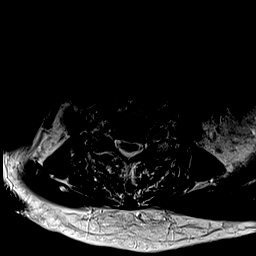
[im 21/36]
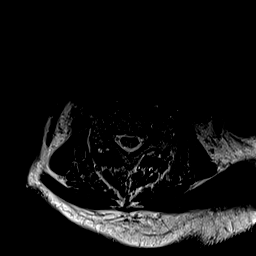
[im 24/36]
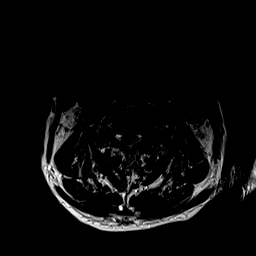
[im 30/36]
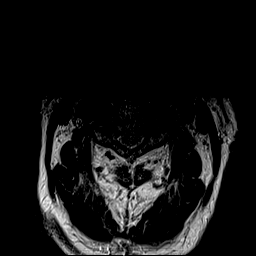
[im 36/36]
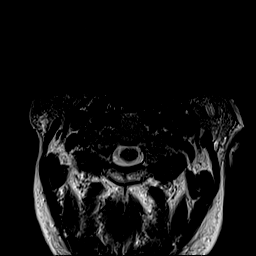

[Series 13: GRE · axial · 3.0mm · 0.35mm/px · z∈[-81,+10]mm · 7 of 36 slices shown]
[im 1/36]
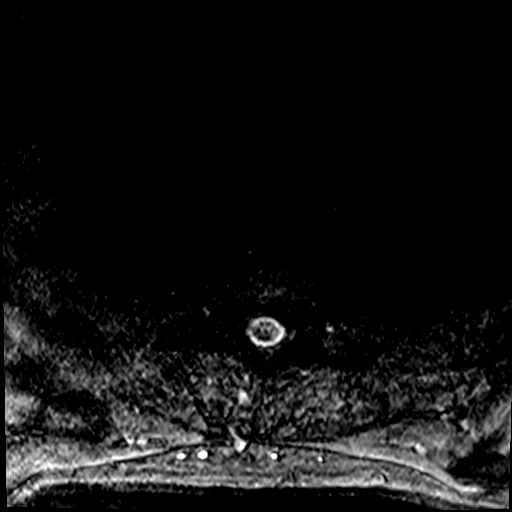
[im 6/36]
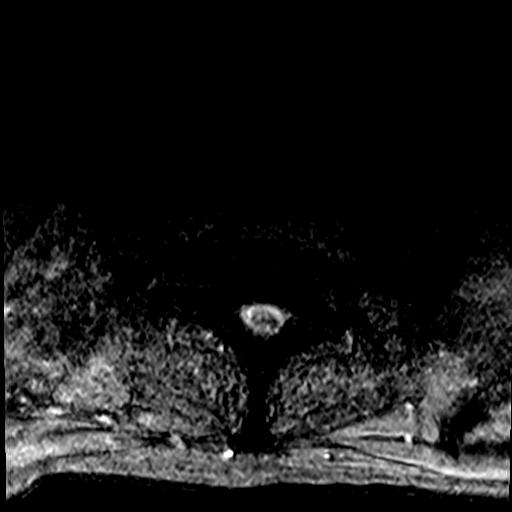
[im 12/36]
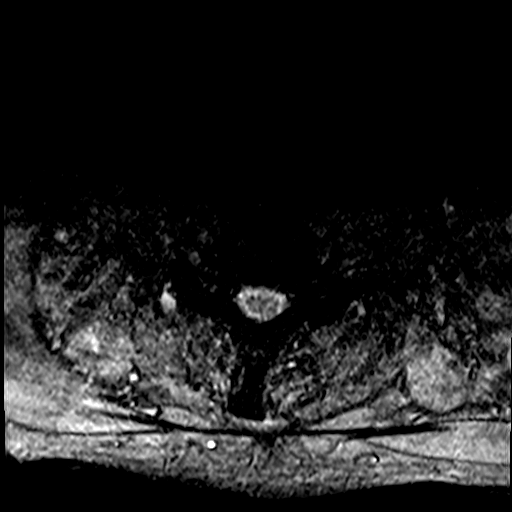
[im 15/36]
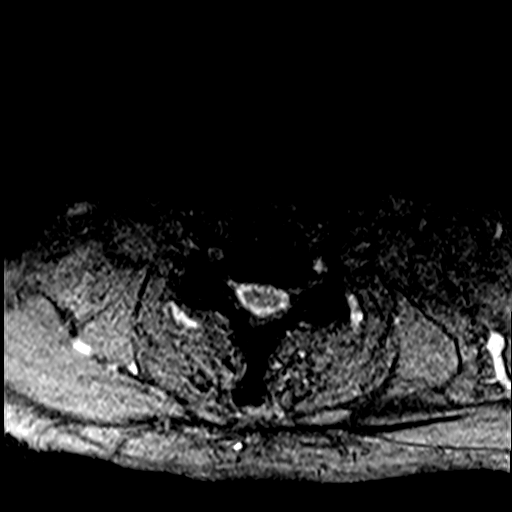
[im 21/36]
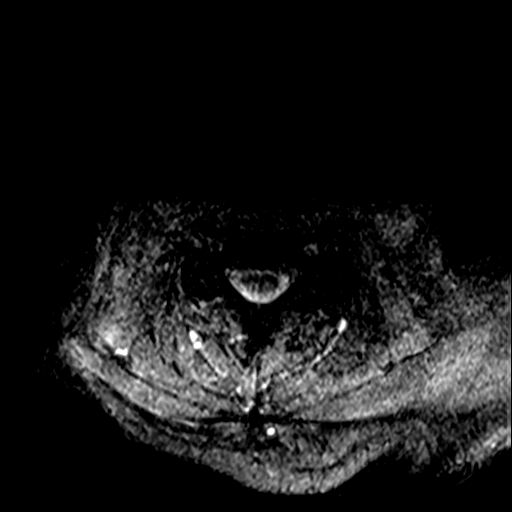
[im 24/36]
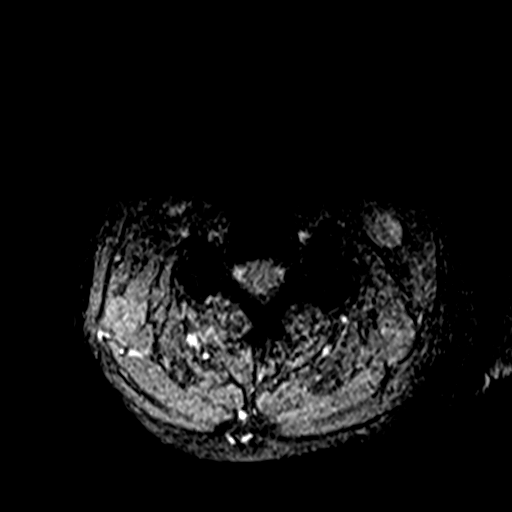
[im 30/36]
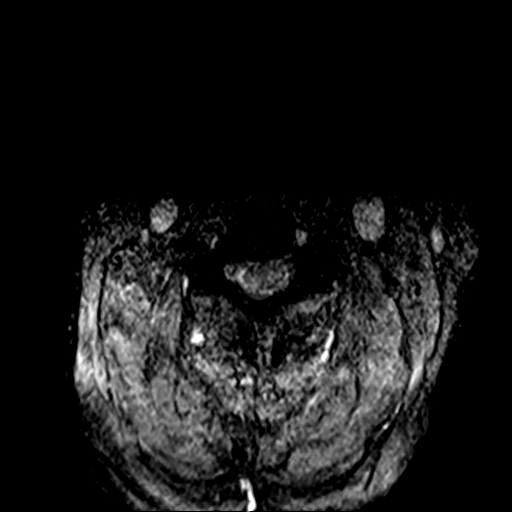

[Series 14: T2 fat-sat · sagittal · 3.0mm · 0.69mm/px · 5 of 15 slices shown]
[im 1/15]
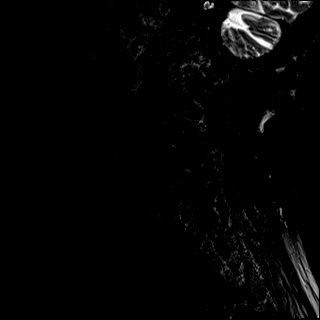
[im 4/15]
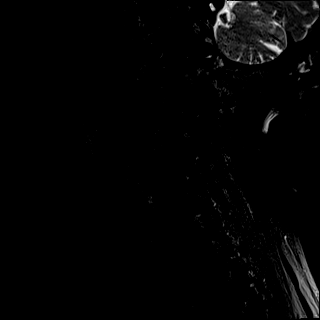
[im 8/15]
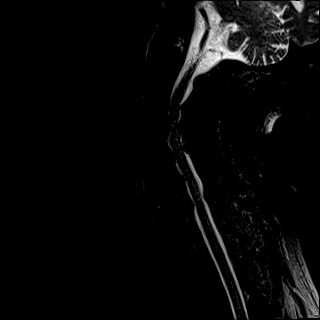
[im 11/15]
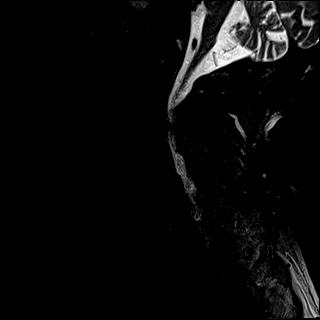
[im 15/15]
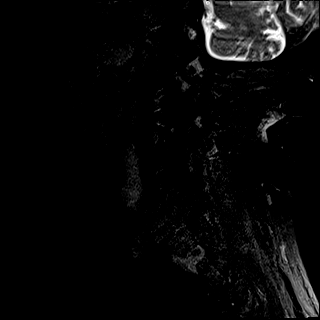

[38 of 48 positions shown; findings below may reference images not displayed]

FINDINGS: Alignment: Straightening of lordosis. Stepwise grade 1
anterolisthesis at the C4-6 levels. Trace C6-7 retrolisthesis.

Vertebrae: Minimal Modic type 2 endplate degenerative changes. No
fracture or aggressive osseous lesion.

Cord: T2 hyperintense cord signal spanning the C2-C4 levels.

Posterior Fossa, vertebral arteries: Negative.

Disc levels: Multilevel desiccation.

C2-3: Disc osteophyte complex with central bulge partially effacing
the ventral CSF containing spaces. Uncovertebral and facet
degenerative spurring. Mild spinal canal and bilateral neural
foraminal narrowing.

C3-4: Disc osteophyte complex abutting/flattening the ventral cord
with uncovertebral and facet hypertrophy. Ligamentum flavum
thickening. Query small right foraminal protrusion. Severe spinal
canal and moderate bilateral neural foraminal narrowing.

C4-5: Disc osteophyte complex with uncovertebral and facet
hypertrophy. Prominent ligamentum flavum. Mild spinal canal, mild
right and moderate left neural foraminal narrowing.

C5-6: Uncovertebral and facet degenerative spurring. No significant
disc bulge. Mild spinal canal and bilateral neural foraminal
narrowing.

C6-7: Disc osteophyte complex with uncovertebral and facet
hypertrophy. Mild-to-moderate spinal canal, moderate right and
severe left neural foraminal narrowing.

C7-T1: Uncovertebral and facet hypertrophy. Patent spinal canal and
left neural foramen. Mild right neural foraminal narrowing.

Paraspinal tissues: Negative.
IMPRESSION: Severe spinal canal and moderate bilateral neural foraminal
narrowing at the C3-4 level.

Moderate spinal canal, moderate right and severe left neural
foraminal narrowing at the C6-7 level.

Moderate left C4-5 neural foraminal narrowing.

Mild spinal canal narrowing at the C2-3, C4-5, C5-6 levels.

## 2021-01-24 IMAGING — MR MR LUMBAR SPINE W/O CM
5 series · 31 of 48 positions shown · non-contrast
Comparison: [DATE] and prior.

CLINICAL DATA: Lower extremity pain, weakness.  Prior falls.

EXAM:
MRI LUMBAR SPINE WITHOUT CONTRAST
TECHNIQUE: Multiplanar, multisequence MR imaging of the lumbar spine was
performed. No intravenous contrast was administered.

[Series 5: T1 · sagittal · 4.0mm · 0.81mm/px · 7 of 17 slices shown (1 of 2)]
[im 1/17]
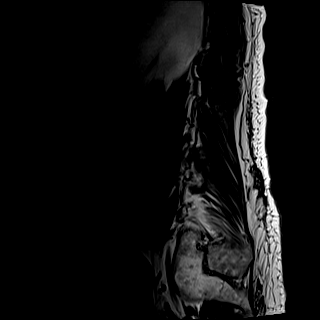
[im 3/17]
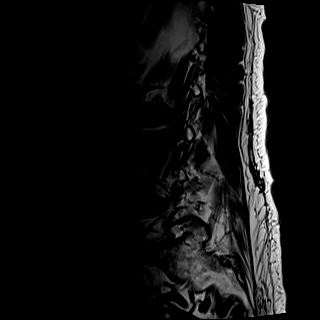
[im 6/17]
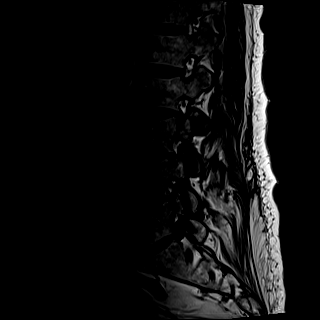
[im 9/17]
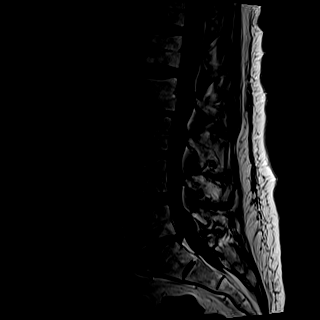
[im 11/17]
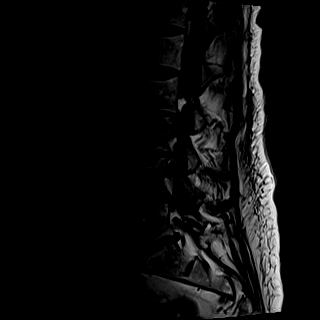
[im 14/17]
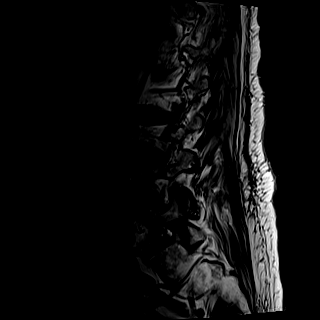
[im 17/17]
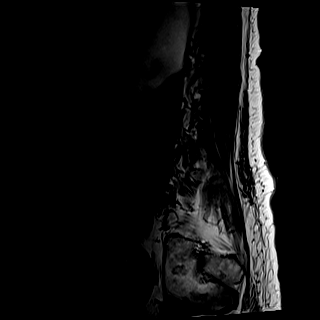

[Series 10: T2 · sagittal · 4.0mm · 0.81mm/px · 7 of 17 slices shown (1 of 2)]
[im 1/17]
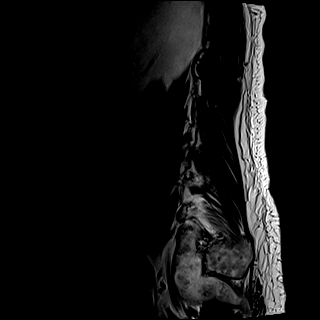
[im 3/17]
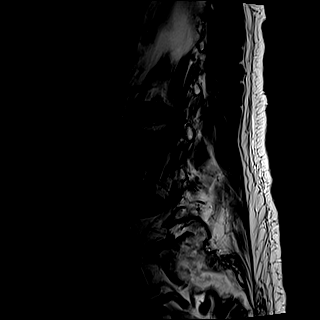
[im 6/17]
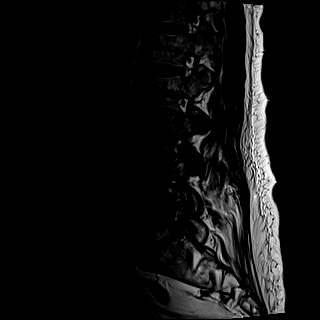
[im 9/17]
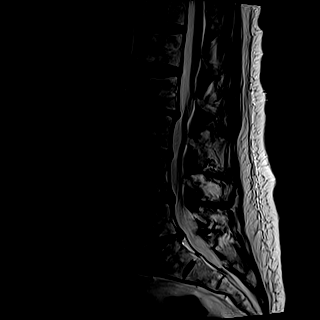
[im 11/17]
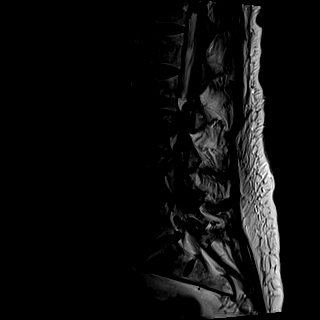
[im 14/17]
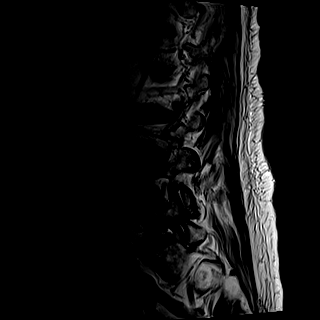
[im 17/17]
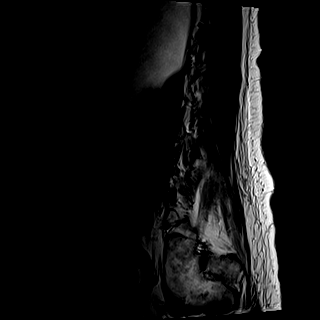

[Series 11: STIR · sagittal · 4.0mm · 0.51mm/px · 1 of 17 slices shown]
[im 1/17]
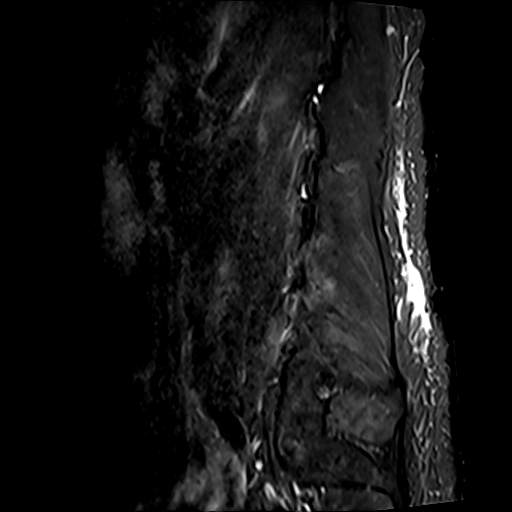

[Series 13: T1 · axial · 4.0mm · 0.39mm/px · z∈[-151,+56]mm · 8 of 38 slices shown (2 of 2)]
[im 1/38]
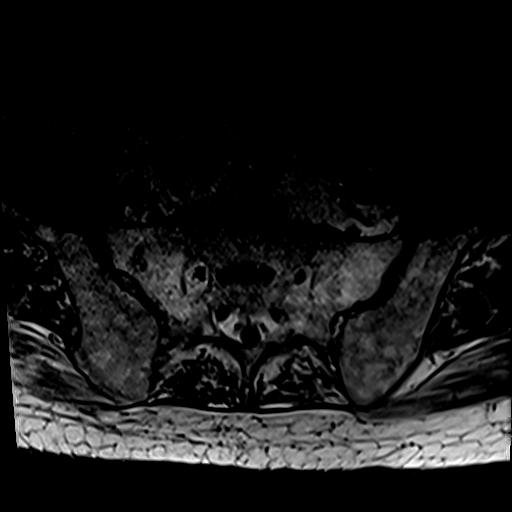
[im 6/38]
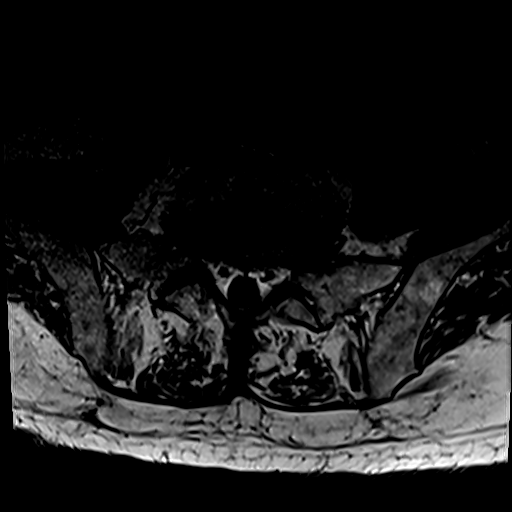
[im 12/38]
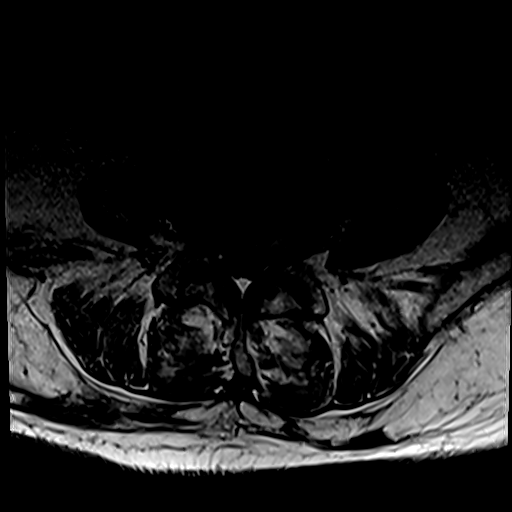
[im 18/38]
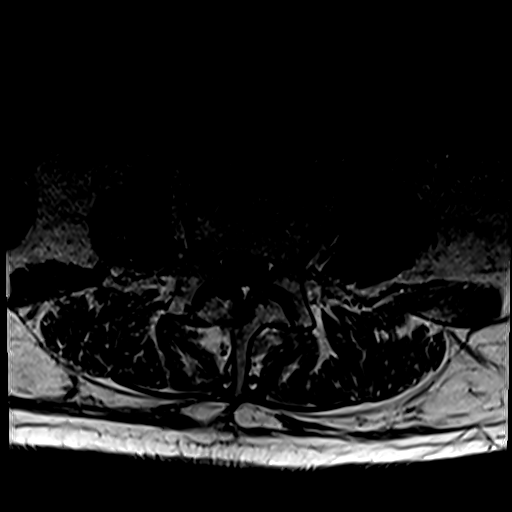
[im 20/38]
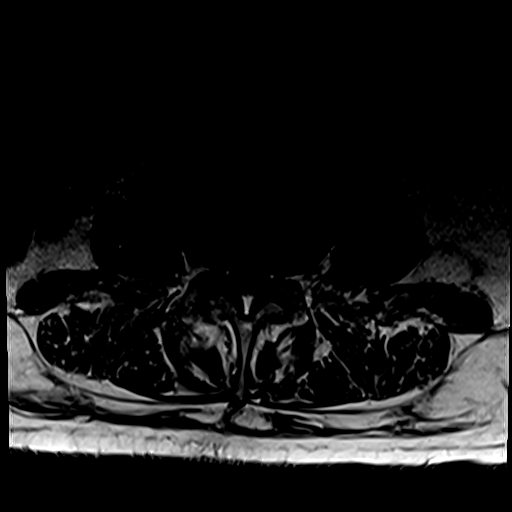
[im 26/38]
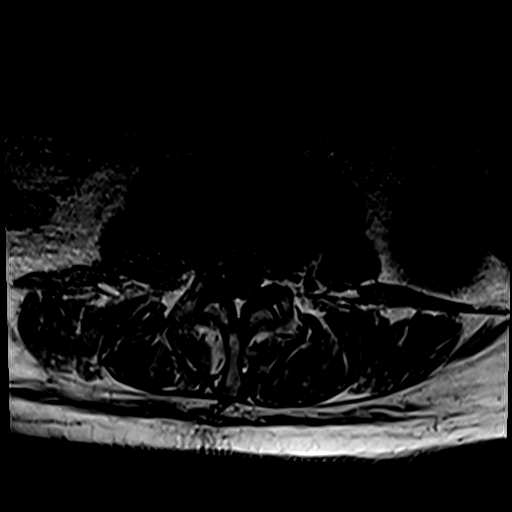
[im 32/38]
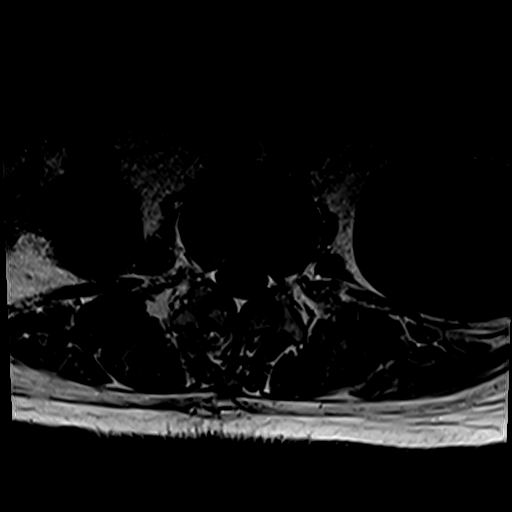
[im 38/38]
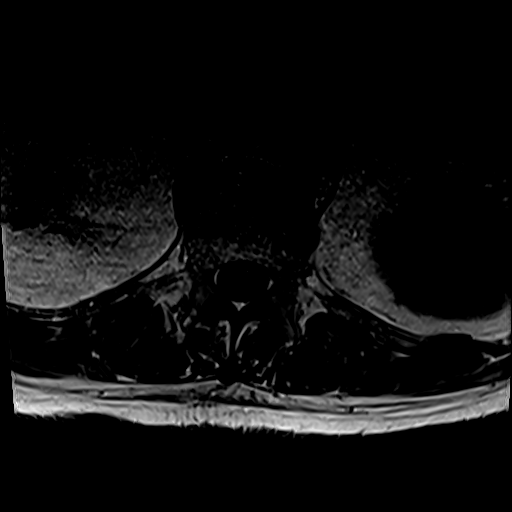

[Series 14: T2 · axial · 4.0mm · 0.62mm/px · z∈[-151,+56]mm · 8 of 38 slices shown (2 of 2)]
[im 1/38]
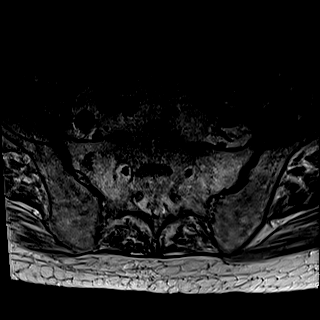
[im 6/38]
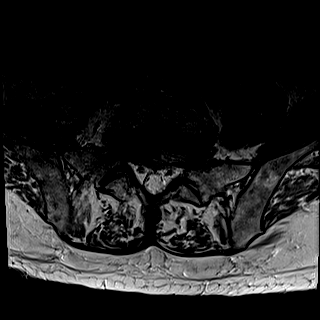
[im 12/38]
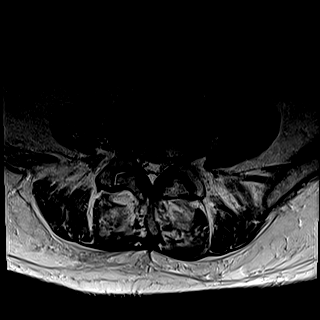
[im 18/38]
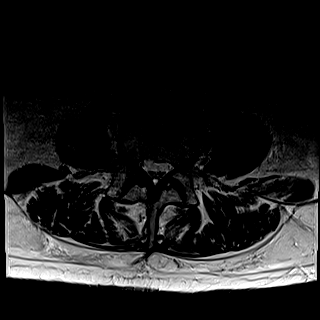
[im 20/38]
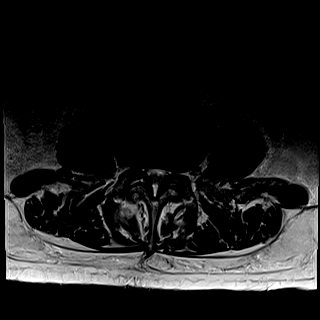
[im 26/38]
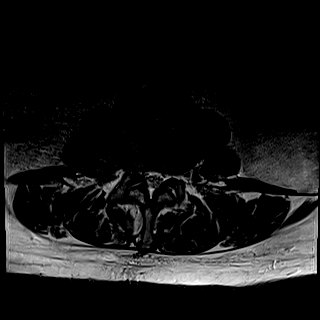
[im 32/38]
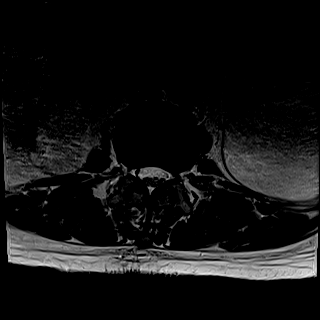
[im 38/38]
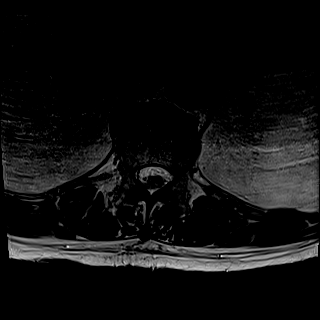

[31 of 48 positions shown; findings below may reference images not displayed]

FINDINGS: Please note some image sequences are mildly degraded by motion
artifact.

Segmentation: The lowest well-formed intervertebral disc space is
assumed to be the L5-S1 level.

Alignment: Straightening of lordosis. Trace L2-3, L5-S1
retrolisthesis.

Vertebrae: Modic type 2 endplate degenerative changes and mild bone
marrow heterogeneity. No fracture or aggressive osseous lesion.

Conus medullaris and cauda equina: Conus extends to the L1 level.
Conus and cauda equina appear normal.

Disc levels: Multilevel desiccation and disc space loss.

L1-2: No significant disc bulge. Bilateral facet degenerative
spurring. Patent spinal canal and neural foramen.

L2-3: Disc bulge and bilateral facet degenerative spurring.
Superimposed inferiorly oriented right paracentral protrusion.
Mild-to-moderate spinal canal, moderate right and mild left neural
foraminal narrowing.

L3-4: Minimal disc bulge and bilateral facet degenerative spurring.
Mild spinal canal, moderate right and mild left neural foraminal
narrowing.

L4-5: Disc bulge with superimposed left extraforaminal protrusion.
Bilateral facet degenerative spurring and ligamentum flavum
thickening. Mild spinal canal and bilateral neural foraminal
narrowing.

L5-S1: Disc bulge with superimposed central protrusion abutting the
descending S1 nerve roots. Bilateral facet hypertrophy. Patent
spinal canal. Mild to moderate bilateral neural foraminal narrowing.

Paraspinal and other soft tissues: Bilateral renal cysts.
IMPRESSION: Mild to moderate L2-3 and mild L3-5 spinal canal narrowing.

Moderate right L2-4 and bilateral L5-S1 neural foraminal narrowing.

Otherwise mild neural foraminal narrowing at the L2-L5 levels.

## 2021-02-07 ENCOUNTER — Other Ambulatory Visit: Payer: Self-pay | Admitting: Orthopedic Surgery

## 2021-02-07 ENCOUNTER — Telehealth: Payer: Self-pay | Admitting: Cardiovascular Disease

## 2021-02-07 NOTE — Telephone Encounter (Signed)
Patient with diagnosis of afib on Eliquis for anticoagulation.    Procedure: C3 - C4 cervical decompression fusion Date of procedure: 02/23/21  CHA2DS2-VASc Score = 5  This indicates a 7.2% annual risk of stroke. The patient's score is based upon: CHF History: Yes HTN History: Yes Diabetes History: No Stroke History: No Vascular Disease History: Yes Age Score: 2 Gender Score: 0  CrCl 56mL/min Platelet count 131K  Pt seen by cardiology inpatient last month, has not established outpatient yet (lives in [place], here temporarily). Will await outpatient cardiologist visit on 5/25 to ensure all available clinical information is reviewed before providing anticoag rec. Based on current CHADS2VASc score, pt would be at acceptable risk to hold his Eliquis for 3 days prior to spinal procedure.

## 2021-02-07 NOTE — Telephone Encounter (Signed)
Clearance request incomplete.  In Epic, he is scheduled for cervical fusion.   Pt has appt with Dr. Audie Box tomorrow.    Will fwd to Pharmacy team for rec's re: holding anticoagulation for spine surgery.  Richardson Dopp, PA-C    02/07/2021 9:26 AM

## 2021-02-07 NOTE — Telephone Encounter (Signed)
Dr. Audie Box  Can you address surgical clearance and anticoagulation recommendations at [place] tomorrow (02/08/21) and route your note to the requesting surgeon?  Richardson Dopp, PA-C    02/07/2021 10:49 AM

## 2021-02-07 NOTE — Telephone Encounter (Signed)
   [facility] Pre-operative Risk Assessment    Patient Name: [name] [name]  DOB: June 10, 1933  MRN: [id number]   HEARTCARE STAFF: - Please ensure there is not already an duplicate clearance open for this procedure. - Under Visit Info/Reason for Call, type in Other and utilize the format Clearance MM/DD/YY or Clearance TBD. Do not use dashes or single digits. - If request is for dental extraction, please clarify the # of teeth to be extracted.  Request for surgical clearance:  1. What type of surgery is being performed? Interior   2. When is this surgery scheduled? 02/23/2021  3. What type of clearance is required (medical clearance vs. Pharmacy clearance to hold med vs. Both)? medical  4. Are there any medications that need to be held prior to surgery and how long?unknown on how long eliquis   5. Practice name and name of physician performing surgery? [facility] and Dr.Dumonski 6. What is the office phone number?[phone]   7.   What is the office fax number?  8.   Anesthesia type (None, local, MAC, general) ? unknown   Lars Pinks 02/07/2021, 9:00 AM  _________________________________________________________________   (provider comments below)

## 2021-02-08 ENCOUNTER — Ambulatory Visit (INDEPENDENT_AMBULATORY_CARE_PROVIDER_SITE_OTHER): Payer: Medicare Other | Admitting: Cardiovascular Disease

## 2021-02-08 ENCOUNTER — Other Ambulatory Visit: Payer: Self-pay

## 2021-02-08 ENCOUNTER — Encounter: Payer: Self-pay | Admitting: Cardiovascular Disease

## 2021-02-08 VITALS — BP 100/70 | Ht 72.0 in | Wt 211.0 lb

## 2021-02-08 DIAGNOSIS — I10 Essential (primary) hypertension: Secondary | ICD-10-CM | POA: Diagnosis not present

## 2021-02-08 DIAGNOSIS — Z01810 Encounter for preprocedural cardiovascular examination: Secondary | ICD-10-CM | POA: Diagnosis not present

## 2021-02-08 DIAGNOSIS — I2510 Atherosclerotic heart disease of native coronary artery without angina pectoris: Secondary | ICD-10-CM | POA: Diagnosis not present

## 2021-02-08 DIAGNOSIS — I4821 Permanent atrial fibrillation: Secondary | ICD-10-CM | POA: Diagnosis not present

## 2021-02-08 DIAGNOSIS — I7121 Aneurysm of the ascending aorta, without rupture: Secondary | ICD-10-CM

## 2021-02-08 DIAGNOSIS — I712 Thoracic aortic aneurysm, without rupture: Secondary | ICD-10-CM

## 2021-02-08 NOTE — Progress Notes (Signed)
Cardiology Office Note:   Date:  02/08/2021  NAME:  [name] [name]    MRN: [id number] DOB:  03/27/1933   PCP:  Antony Contras, MD  Cardiologist:  Evalina Field, MD  Electrophysiologist:  None   Referring MD: Antony Contras, MD   Chief Complaint  Patient presents with  . Pre-op Exam   History of Present Illness:   [name] [name] is a 87 y.o. male with a hx of CAD, permanent Afib, ascending aortic aneurysm, CKD IV, HTN who presents for follow-up. Hospitalized in April for volume overload and AKI. Echo with normal EF. He has CKD stage IV/V. Will undergo spinal surgery.  He presents with his daughter and son.  He is now at [facility] rehab facility.  He will have spinal fusion surgery with Dr. Phylliss Bob.  He is without chest pain or shortness of breath.  He is no longer taking Lasix.  He is on aspirin Eliquis.  He will need instructions on how to take these for surgery.  BP 100/70.  His daughter reports he is doing well at rehab.  He is ready to undergo surgery.  He is without complaints.  Given his significant kidney disease I see no need to delay surgery.  This would not change management.  I did review his recent cervical spine MRI.  He has severe spinal canal narrowing at the C3-4 level.  His surgery is rather urgent as this is prohibiting his recovery.  Problem List 1. CAD -NSTEMI 2013 -PCI LCX/OM2 w/ BMS 2. Permanent Afib  3. Ascending aortic aneurysm -42 mm 12/21/2020 4. CKD IV 5. HTN  Past Medical History: Past Medical History:  Diagnosis Date  . Acid reflux   . Angina   . Arrhythmia   . CAD (coronary artery disease), stent placed 11/26/11  11/27/2011  . Heart murmur   . Hypertension   . NSTEMI (non-ST elevated myocardial infarction) most likely as outpatient 11/24/2011  . Prostate cancer ([place])   . S/P angioplasty with stent, 11/26/11 distal AVGroove LCX into OM3 with BMS ([facility]) and rescue PTCA on OM2 jailed by stent. 11/27/2011    Past Surgical History: Past Surgical  History:  Procedure Laterality Date  . CARDIAC CATHETERIZATION    . CATARACT EXTRACTION W/ INTRAOCULAR LENS  IMPLANT, BILATERAL  ~ 2007  . ESOPHAGOGASTRODUODENOSCOPY N/A 12/23/2020   Procedure: ESOPHAGOGASTRODUODENOSCOPY (EGD);  Surgeon: Arta Silence, MD;  Location: [facility] ENDOSCOPY;  Service: Endoscopy;  Laterality: N/A;  . INSERTION PROSTATE RADIATION SEED  2001  . LEFT HEART CATHETERIZATION WITH CORONARY ANGIOGRAM N/A 11/26/2011   Procedure: LEFT HEART CATHETERIZATION WITH CORONARY ANGIOGRAM;  Surgeon: Sanda Klein, MD;  Location: [place] CATH LAB;  Service: Cardiovascular;  Laterality: N/A;  . STAPEDECTOMY  1960; 1980   left; right    Current Medications: Current Meds  Medication Sig  . acetaminophen (TYLENOL) 650 MG CR tablet Take 650 mg by mouth every 8 (eight) hours as needed for pain.  Marland Kitchen allopurinol (ZYLOPRIM) 100 MG tablet Take 1 tablet (100 mg total) by mouth daily.  Marland Kitchen amLODipine (NORVASC) 5 MG tablet Take 5 mg by mouth daily.  Marland Kitchen apixaban (ELIQUIS) 2.5 MG TABS tablet Take 1 tablet (2.5 mg total) by mouth 2 (two) times daily.  Marland Kitchen aspirin EC 81 MG tablet Take 81 mg by mouth daily.  . bimatoprost (LUMIGAN) 0.03 % ophthalmic solution Place 1 drop into both eyes every evening.  . carvedilol (COREG) 25 MG tablet Take 25 mg by mouth 2 (two) times daily with a meal.  .  lidocaine (LIDODERM) 5 % Place 1 patch onto the skin daily. Remove & Discard patch within 12 hours or as directed by MD  . MELATONIN PO Take 1 tablet by mouth at bedtime as needed.  . Omega-3 Fatty Acids (FISH OIL) 1000 MG CAPS Take 1,000 mg by mouth daily.  . pantoprazole (PROTONIX) 40 MG tablet Take 1 tablet (40 mg total) by mouth 2 (two) times daily before a meal.  . rosuvastatin (CRESTOR) 5 MG tablet Take 5 mg by mouth See admin instructions. Take 5mg  daily on Mondays, wednesdays, and fridays  . vitamin B-12 (CYANOCOBALAMIN) 500 MCG tablet Take 500 mcg by mouth daily.     Allergies:    Hydrochlorothiazide and Morphine and  related   Social History: Social History   Socioeconomic History  . Marital status: Widowed    Spouse name: Not on file  . Number of children: Not on file  . Years of education: Not on file  . Highest education level: Not on file  Occupational History  . Not on file  Tobacco Use  . Smoking status: Former Smoker    Packs/day: 0.50    Years: 14.00    Pack years: 7.00    Types: Cigars, Cigarettes    Quit date: 09/17/1978    Years since quitting: 42.4  . Smokeless tobacco: Never Used  Substance and Sexual Activity  . Alcohol use: Yes    Comment: 11/23/11 "haven't drank anything  since ~ 2008"  . Drug use: No  . Sexual activity: Never  Other Topics Concern  . Not on file  Social History Narrative  . Not on file   Social Determinants of Health   Financial Resource Strain: Not on file  Food Insecurity: Not on file  Transportation Needs: Not on file  Physical Activity: Not on file  Stress: Not on file  Social Connections: Not on file     Family History: The patient's family history includes Hypertension in his father.  ROS:   All other ROS reviewed and negative. Pertinent positives noted in the HPI.     EKGs/Labs/Other Studies Reviewed:   The following studies were personally reviewed by me today:  TTE 12/22/2020 1. Left ventricular ejection fraction, by estimation, is 55 to 60%. The  left ventricle has normal function. The left ventricle has no regional  wall motion abnormalities. There is moderate left ventricular hypertrophy.  Left ventricular diastolic  parameters are indeterminate.  2. Right ventricular systolic function is normal. The right ventricular  size is normal. There is normal pulmonary artery systolic pressure.  3. Left atrial size was moderately dilated.  4. Right atrial size was mildly dilated.  5. The pericardial effusion is anterior to the right ventricle.  6. The mitral valve is degenerative. Mild mitral valve regurgitation. No  evidence of  mitral stenosis. Moderate mitral annular calcification.  7. The aortic valve is tricuspid. There is moderate calcification of the  aortic valve. Aortic valve regurgitation is not visualized. Mild to  moderate aortic valve sclerosis/calcification is present, without any  evidence of aortic stenosis.  8. There is mild dilatation of the ascending aorta, measuring 38 mm.  9. The inferior vena cava is normal in size with greater than 50%  respiratory variability, suggesting right atrial pressure of 3 mmHg.   Recent Labs: 12/21/2020: B Natriuretic Peptide 969.4; TSH 2.577 01/01/2021: ALT 23 01/04/2021: BUN 63; Creatinine, Ser 3.98; Hemoglobin 9.0; Platelets 131; Potassium 4.2; Sodium 138   Recent Lipid Panel    Component  Value Date/Time   CHOL 146 11/24/2011 0530   TRIG 156 (H) 11/24/2011 0530   HDL 31 (L) 11/24/2011 0530   CHOLHDL 4.7 11/24/2011 0530   VLDL 31 11/24/2011 0530   LDLCALC 84 11/24/2011 0530    Physical Exam:   VS:  BP 100/70   Ht 6' (1.829 m)   Wt 211 lb (95.7 kg)   SpO2 100%   BMI 28.62 kg/m    Wt Readings from Last 3 Encounters:  02/08/21 211 lb (95.7 kg)  01/04/21 216 lb 4.3 oz (98.1 kg)  11/28/11 225 lb 12 oz (102.4 kg)    General: Well nourished, well developed, in no acute distress Head: Atraumatic, normal size  Eyes: PEERLA, EOMI  Neck: Supple, no JVD Endocrine: No thryomegaly Cardiac: Normal S1, S2; irregular rhythm, no murmurs rubs or gallops Lungs: Clear to auscultation bilaterally, no wheezing, rhonchi or rales  Abd: Soft, nontender, no hepatomegaly  Ext: No edema, pulses 2+ Musculoskeletal: No deformities, BUE and BLE strength normal and equal Skin: Warm and dry, no rashes   Neuro: Alert and oriented to person, place, time, and situation, CNII-XII grossly intact, no focal deficits  Psych: Normal mood and affect   ASSESSMENT:   [name] [name] is a 87 y.o. male who presents for the following: 1. Preoperative cardiovascular examination   2.  Coronary artery disease involving native coronary artery of native heart without angina pectoris   3. Permanent atrial fibrillation ([place])   4. Primary hypertension   5. Ascending aortic aneurysm (HCC)     PLAN:   1. Preoperative cardiovascular examination -The Revised Cardiac Risk Index = 2 (MI, SCR >2) which equates to 6.6% (moderate) estimated risk of perioperative myocardial infarction, pulmonary edema, ventricular fibrillation, cardiac arrest, or complete heart block.  -He cannot complete greater than 4 METS.  However he has significant CKD stage IV on 5.  He would not be a candidate for invasive coronary angiography regardless of the stress test.  I have also reviewed his MRI of his spine.  He has severe spinal stenosis.  His surgeon is rather urgent in my opinion.  I see no need to delay surgery for a stress test as this would not change her management. -He had a recent echocardiogram in the hospital that shows normal LV function.  He denies any chest pain or shortness of breath.  I believe he is acceptable to proceed to surgery.  He is moderate risk by the RCRI.  This does not account for his advanced age.  I suspect he is intermediate to high risk surgery. -Our service is available as needed in the peri-operative period.   -No further testing is recommended prior to surgery. -I have instructed him to stop his aspirin 5 days before surgery and Eliquis 2 days before surgery.  They will resume his aspirin and Eliquis postoperatively as deemed appropriate by surgeon.  2. Coronary artery disease involving native coronary artery of native heart without angina pectoris -Prior history of PCI in 2013.  No symptoms of angina.  Instructions for aspirin as above. -Continue statin.  3. Permanent atrial fibrillation (HCC) -Rate controlled. -On Eliquis.  Instructions given regarding holding before surgery.  4. Primary hypertension -Well-controlled on current medications.  5. Ascending aortic  aneurysm (HCC) -42 mm ascending aorta.  Likely normal for his age.  Given his age and advanced kidney disease I see no need for further evaluation of this.  He would not be a candidate for aortic surgery.  Disposition: Return  in about 6 months (around 08/11/2021).  Medication Adjustments/Labs and Tests Ordered: Current medicines are reviewed at length with the patient today.  Concerns regarding medicines are outlined above.  No orders of the defined types were placed in this encounter.  No orders of the defined types were placed in this encounter.   Patient Instructions  Medication Instructions:  STOP Aspirin 5 days before surgery STOP Eliquis 2 days before surgery   You may resume medications per the surgeon.   *If you need a refill on your cardiac medications before your next appointment, please call your pharmacy*    Follow-Up: At [facility], you and your health needs are our priority.  As part of our continuing mission to provide you with exceptional heart care, we have created designated Provider Care Teams.  These Care Teams include your primary Cardiologist (physician) and Advanced Practice Providers (APPs -  Physician Assistants and Nurse Practitioners) who all work together to provide you with the care you need, when you need it.  We recommend signing up for the patient [name] called "MyChart".  Sign up information is provided on this After Visit Summary.  MyChart is used to connect with patients for Virtual Visits (Telemedicine).  Patients are able to view lab/test results, encounter notes, upcoming appointments, etc.  Non-urgent messages can be sent to your provider as well.   To learn more about what you can do with MyChart, go to [email or web address].    Your next appointment:   6 month(s)  The format for your next appointment:   In Person  Provider:   Eleonore Chiquito, MD        Time Spent with Patient: I have spent a total of 35 minutes with patient  reviewing hospital notes, telemetry, EKGs, labs and examining the patient as well as establishing an assessment and plan that was discussed with the patient.  > 50% of time was spent in direct patient care.  Signed, Addison Naegeli. Audie Box, MD, [place]  [address] [place], [place] [address] [phone]  02/08/2021 4:22 PM

## 2021-02-08 NOTE — Patient Instructions (Signed)
Medication Instructions:  STOP Aspirin 5 days before surgery STOP Eliquis 2 days before surgery   You may resume medications per the surgeon.   *If you need a refill on your cardiac medications before your next appointment, please call your pharmacy*    Follow-Up: At [facility], you and your health needs are our priority.  As part of our continuing mission to provide you with exceptional heart care, we have created designated Provider Care Teams.  These Care Teams include your primary Cardiologist (physician) and Advanced Practice Providers (APPs -  Physician Assistants and Nurse Practitioners) who all work together to provide you with the care you need, when you need it.  We recommend signing up for the patient portal called "MyChart".  Sign up information is provided on this After Visit Summary.  MyChart is used to connect with patients for Virtual Visits (Telemedicine).  Patients are able to view lab/test results, encounter notes, upcoming appointments, etc.  Non-urgent messages can be sent to your provider as well.   To learn more about what you can do with MyChart, go to [email or web address].    Your next appointment:   6 month(s)  The format for your next appointment:   In Person  Provider:   Eleonore Chiquito, MD

## 2021-02-08 NOTE — Telephone Encounter (Signed)
Notes faxed to surgeon. This phone note will be removed from the preop pool. Richardson Dopp, PA-C  02/08/2021 5:09 PM

## 2021-02-11 ENCOUNTER — Emergency Department (HOSPITAL_COMMUNITY): Payer: Medicare Other

## 2021-02-11 ENCOUNTER — Emergency Department (HOSPITAL_COMMUNITY)
Admission: EM | Admit: 2021-02-11 | Discharge: 2021-02-11 | Disposition: A | Discharge status: Skilled Nursing Facility | Payer: Medicare Other | Attending: Emergency Medicine | Admitting: Emergency Medicine

## 2021-02-11 ENCOUNTER — Encounter (HOSPITAL_COMMUNITY): Payer: Self-pay

## 2021-02-11 DIAGNOSIS — Z20822 Contact with and (suspected) exposure to covid-19: Secondary | ICD-10-CM | POA: Insufficient documentation

## 2021-02-11 DIAGNOSIS — Z8546 Personal history of malignant neoplasm of prostate: Secondary | ICD-10-CM | POA: Insufficient documentation

## 2021-02-11 DIAGNOSIS — I2510 Atherosclerotic heart disease of native coronary artery without angina pectoris: Secondary | ICD-10-CM | POA: Insufficient documentation

## 2021-02-11 DIAGNOSIS — Z79899 Other long term (current) drug therapy: Secondary | ICD-10-CM | POA: Diagnosis not present

## 2021-02-11 DIAGNOSIS — R4182 Altered mental status, unspecified: Secondary | ICD-10-CM | POA: Insufficient documentation

## 2021-02-11 DIAGNOSIS — R410 Disorientation, unspecified: Secondary | ICD-10-CM | POA: Insufficient documentation

## 2021-02-11 DIAGNOSIS — Z7982 Long term (current) use of aspirin: Secondary | ICD-10-CM | POA: Insufficient documentation

## 2021-02-11 DIAGNOSIS — N183 Chronic kidney disease, stage 3 unspecified: Secondary | ICD-10-CM | POA: Diagnosis not present

## 2021-02-11 DIAGNOSIS — Z87891 Personal history of nicotine dependence: Secondary | ICD-10-CM | POA: Insufficient documentation

## 2021-02-11 DIAGNOSIS — I130 Hypertensive heart and chronic kidney disease with heart failure and stage 1 through stage 4 chronic kidney disease, or unspecified chronic kidney disease: Secondary | ICD-10-CM | POA: Diagnosis not present

## 2021-02-11 DIAGNOSIS — B356 Tinea cruris: Secondary | ICD-10-CM | POA: Insufficient documentation

## 2021-02-11 DIAGNOSIS — R2243 Localized swelling, mass and lump, lower limb, bilateral: Secondary | ICD-10-CM | POA: Diagnosis not present

## 2021-02-11 DIAGNOSIS — I509 Heart failure, unspecified: Secondary | ICD-10-CM | POA: Diagnosis not present

## 2021-02-11 LAB — DIFFERENTIAL
Abs Immature Granulocytes: 0.05 10*3/uL (ref 0.00–0.07)
Basophils Absolute: 0 10*3/uL (ref 0.0–0.1)
Basophils Relative: 1 %
Eosinophils Absolute: 0.5 10*3/uL (ref 0.0–0.5)
Eosinophils Relative: 9 %
Immature Granulocytes: 1 %
Lymphocytes Relative: 17 %
Lymphs Abs: 0.9 10*3/uL (ref 0.7–4.0)
Monocytes Absolute: 0.4 10*3/uL (ref 0.1–1.0)
Monocytes Relative: 7 %
Neutro Abs: 3.7 10*3/uL (ref 1.7–7.7)
Neutrophils Relative %: 65 %

## 2021-02-11 LAB — COMPREHENSIVE METABOLIC PANEL
ALT: 19 U/L (ref 0–44)
AST: 14 U/L — ABNORMAL LOW (ref 15–41)
Albumin: 3.4 g/dL — ABNORMAL LOW (ref 3.5–5.0)
Alkaline Phosphatase: 83 U/L (ref 38–126)
Anion gap: 8 (ref 5–15)
BUN: 60 mg/dL — ABNORMAL HIGH (ref 8–23)
CO2: 24 mmol/L (ref 22–32)
Calcium: 11 mg/dL — ABNORMAL HIGH (ref 8.9–10.3)
Chloride: 113 mmol/L — ABNORMAL HIGH (ref 98–111)
Creatinine, Ser: 3.7 mg/dL — ABNORMAL HIGH (ref 0.61–1.24)
GFR, Estimated: 15 mL/min — ABNORMAL LOW (ref >60)
Glucose, Bld: 102 mg/dL — ABNORMAL HIGH (ref 70–99)
Potassium: 4.1 mmol/L (ref 3.5–5.1)
Sodium: 145 mmol/L (ref 135–145)
Total Bilirubin: 0.3 mg/dL (ref 0.3–1.2)
Total Protein: 6.5 g/dL (ref 6.5–8.1)

## 2021-02-11 LAB — URINALYSIS, ROUTINE W REFLEX MICROSCOPIC
Bacteria, UA: NONE SEEN
Bilirubin Urine: NEGATIVE
Glucose, UA: NEGATIVE mg/dL
Hgb urine dipstick: NEGATIVE
Ketones, ur: NEGATIVE mg/dL
Leukocytes,Ua: NEGATIVE
Nitrite: NEGATIVE
Protein, ur: 30 mg/dL — AB
Specific Gravity, Urine: 1.012 (ref 1.005–1.030)
pH: 5 (ref 5.0–8.0)

## 2021-02-11 LAB — CBC
HCT: 27.1 % — ABNORMAL LOW (ref 39.0–52.0)
Hemoglobin: 8.5 g/dL — ABNORMAL LOW (ref 13.0–17.0)
MCH: 30.6 pg (ref 26.0–34.0)
MCHC: 31.4 g/dL (ref 30.0–36.0)
MCV: 97.5 fL (ref 80.0–100.0)
Platelets: 103 10*3/uL — ABNORMAL LOW (ref 150–400)
RBC: 2.78 MIL/uL — ABNORMAL LOW (ref 4.22–5.81)
RDW: 17.1 % — ABNORMAL HIGH (ref 11.5–15.5)
WBC: 5.6 10*3/uL (ref 4.0–10.5)
nRBC: 0 % (ref 0.0–0.2)

## 2021-02-11 LAB — RAPID URINE DRUG SCREEN, HOSP PERFORMED
Amphetamines: NOT DETECTED
Barbiturates: NOT DETECTED
Benzodiazepines: NOT DETECTED
Cocaine: NOT DETECTED
Opiates: NOT DETECTED
Tetrahydrocannabinol: NOT DETECTED

## 2021-02-11 LAB — RESP PANEL BY RT-PCR (FLU A&B, COVID) ARPGX2
Influenza A by PCR: NEGATIVE
Influenza B by PCR: NEGATIVE
SARS Coronavirus 2 by RT PCR: NEGATIVE

## 2021-02-11 LAB — ETHANOL: Alcohol, Ethyl (B): <10 mg/dL (ref <10)

## 2021-02-11 LAB — APTT: aPTT: 40 seconds — ABNORMAL HIGH (ref 24–36)

## 2021-02-11 LAB — PROTIME-INR
INR: 1.5 — ABNORMAL HIGH (ref 0.8–1.2)
Prothrombin Time: 18.1 seconds — ABNORMAL HIGH (ref 11.4–15.2)

## 2021-02-11 IMAGING — CT CT HEAD W/O CM
3 of 4 series · 15 of 47 positions shown, 18 images · non-contrast
Comparison: [DATE]

CLINICAL DATA: Increased confusion.

EXAM:
CT HEAD WITHOUT CONTRAST
TECHNIQUE: Contiguous axial images were obtained from the base of the skull
through the vertex without intravenous contrast.

[Series 2: head wo · axial · 0.49mm/px · z∈[+1548,+1694]mm · 9 of 35 slices shown, 12 images]
[im 3/35  brain]
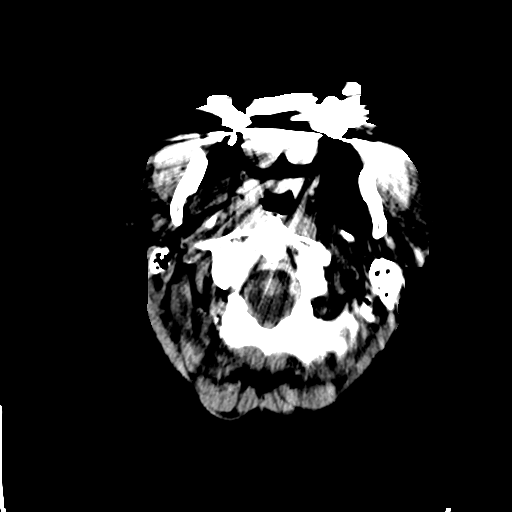
[im 3/35  bone]
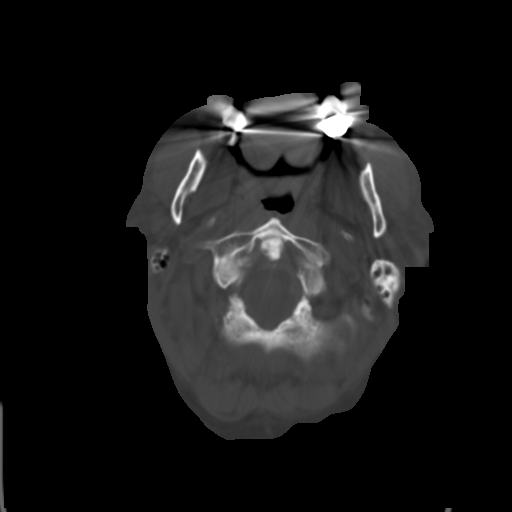
[im 7/35  brain]
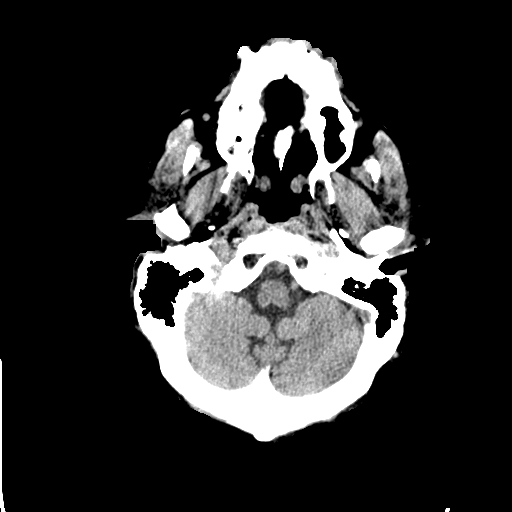
[im 10/35  brain]
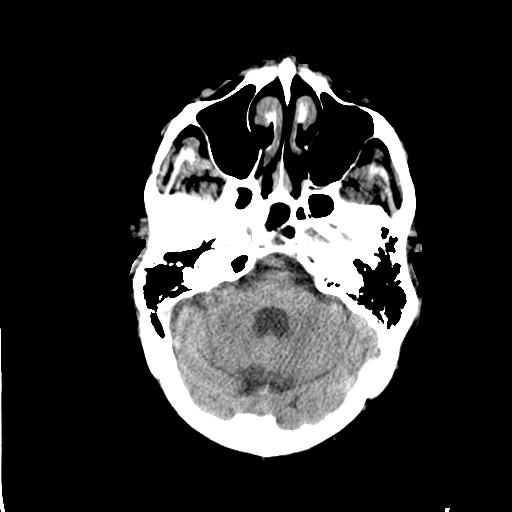
[im 14/35  brain]
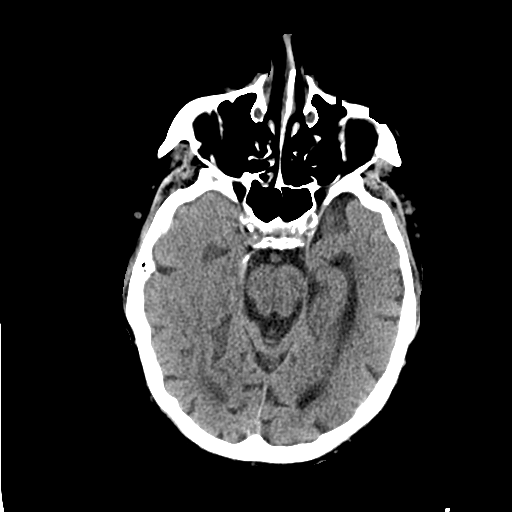
[im 18/35  brain]
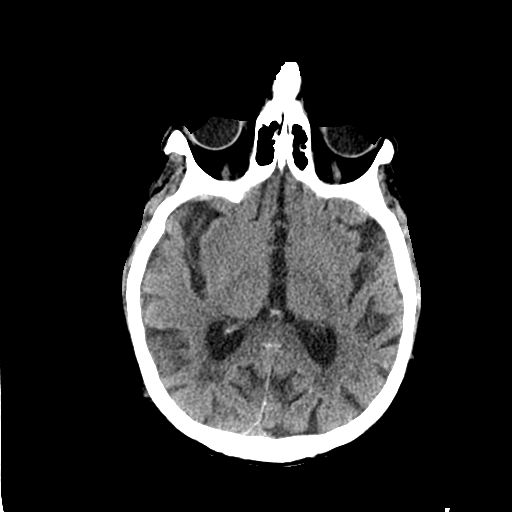
[im 18/35  bone]
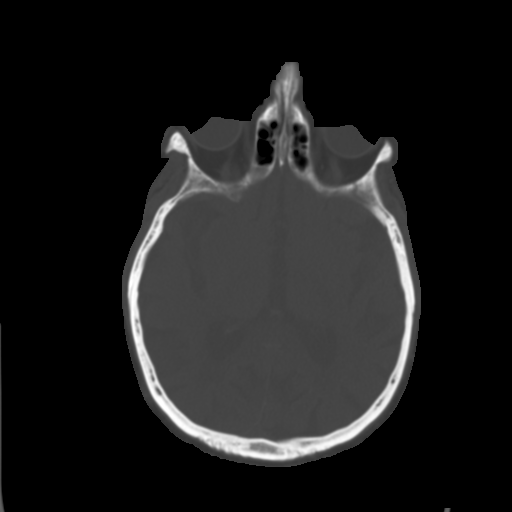
[im 21/35  brain]
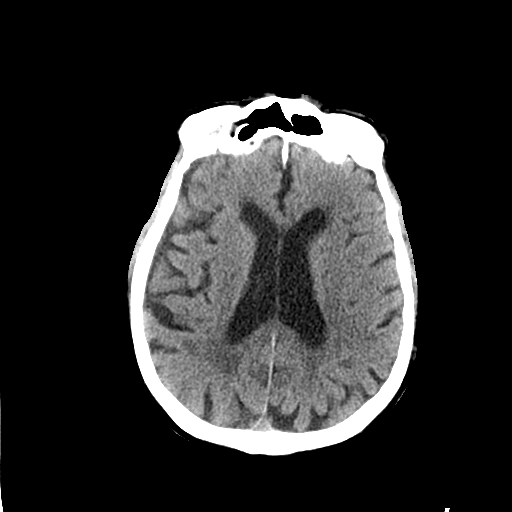
[im 25/35  brain]
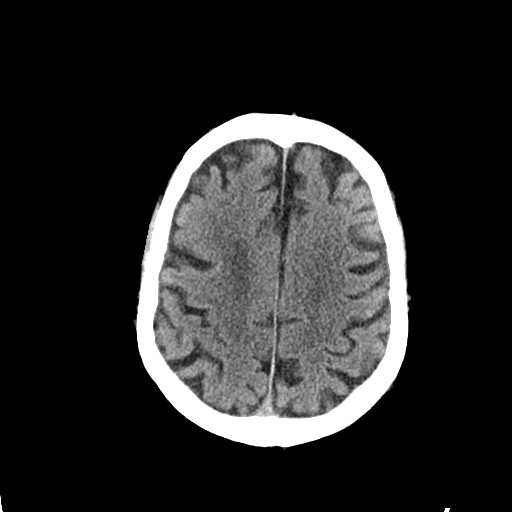
[im 29/35  brain]
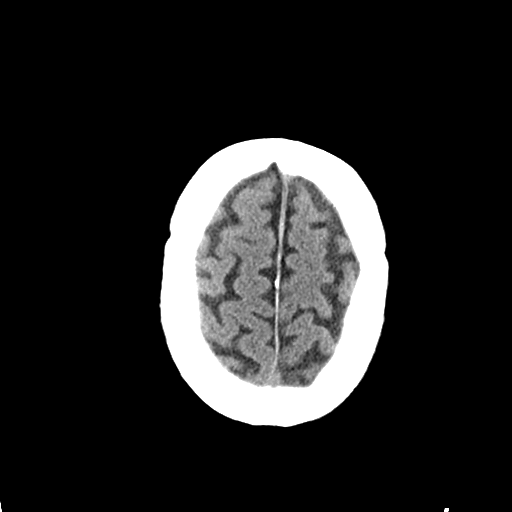
[im 32/35  brain]
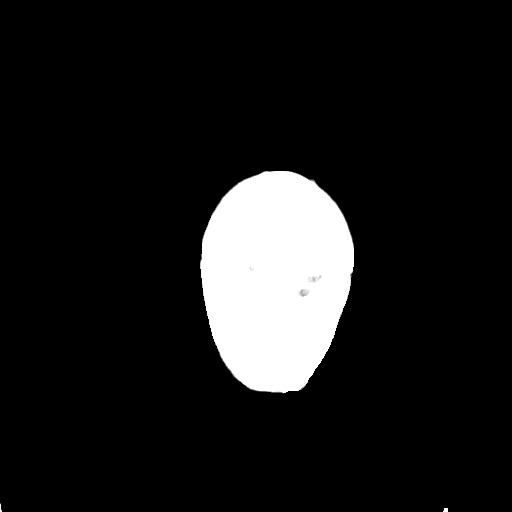
[im 32/35  bone]
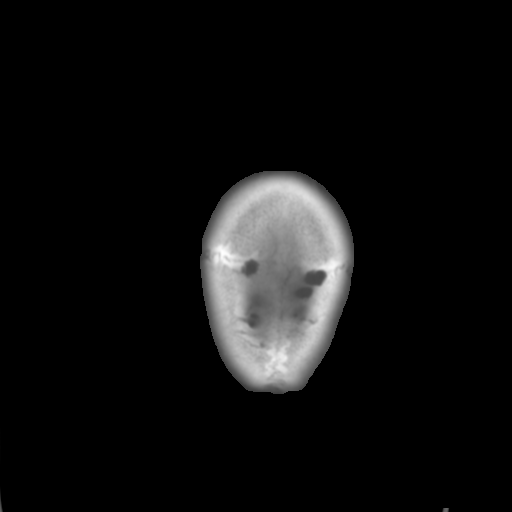

[Series 5: coronal soft tissue · coronal · 0.36mm/px · 3 of 73 slices shown]
[im 25/73  brain]
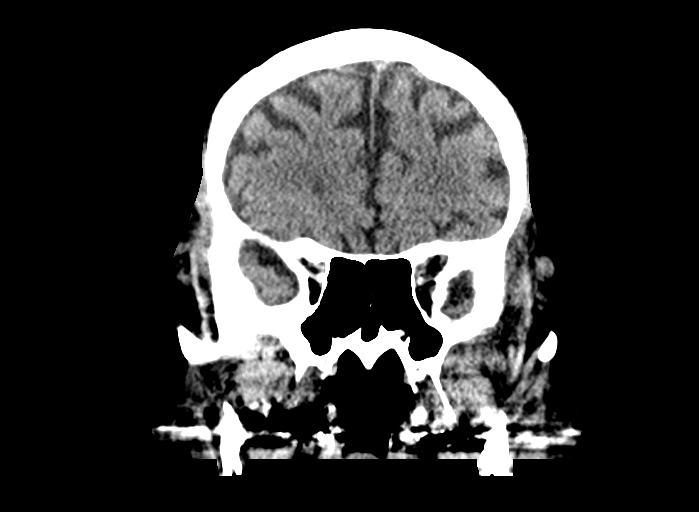
[im 33/73  brain]
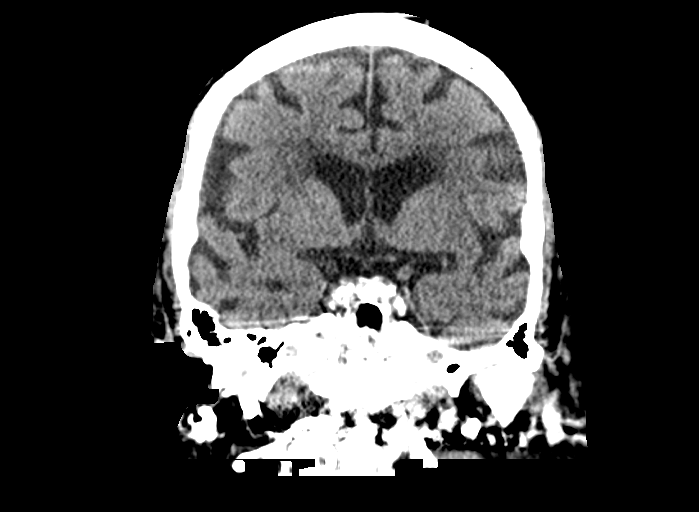
[im 41/73  brain]
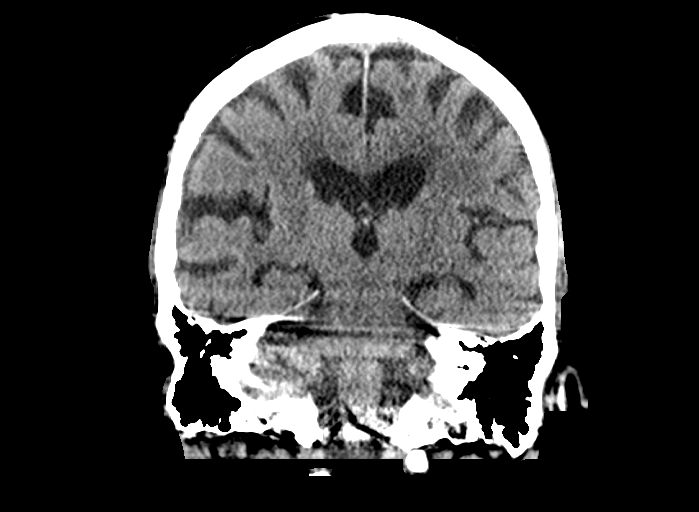

[Series 6: sagittal soft tissue · sagittal · 0.35mm/px · 3 of 56 slices shown]
[im 19/56  brain]
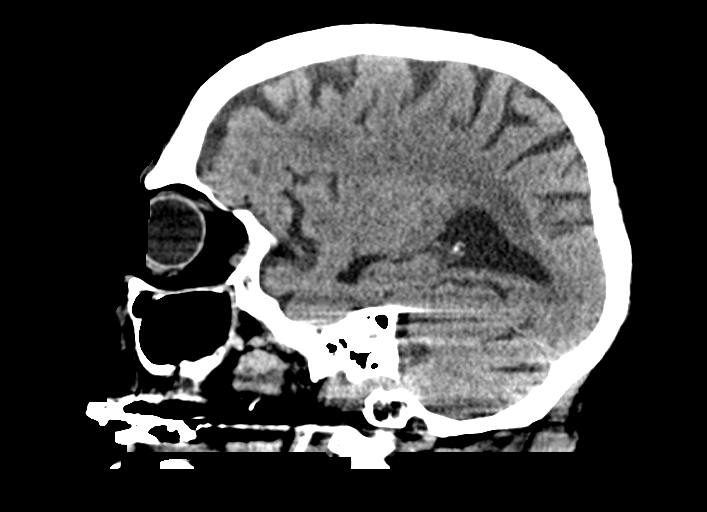
[im 28/56  brain]
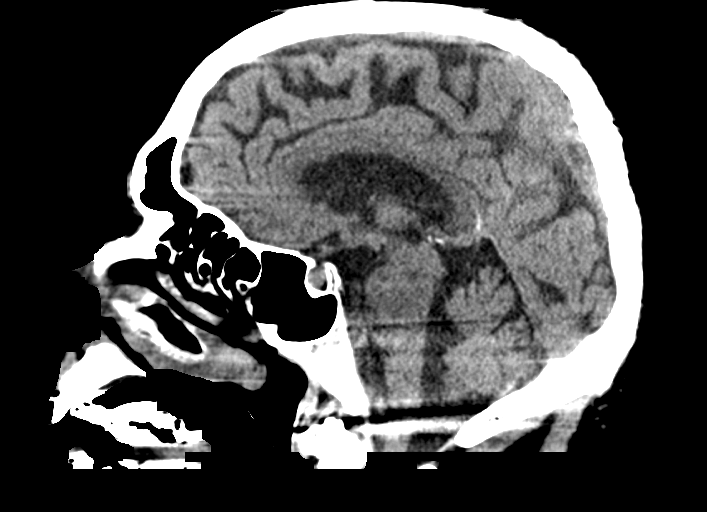
[im 37/56  brain]
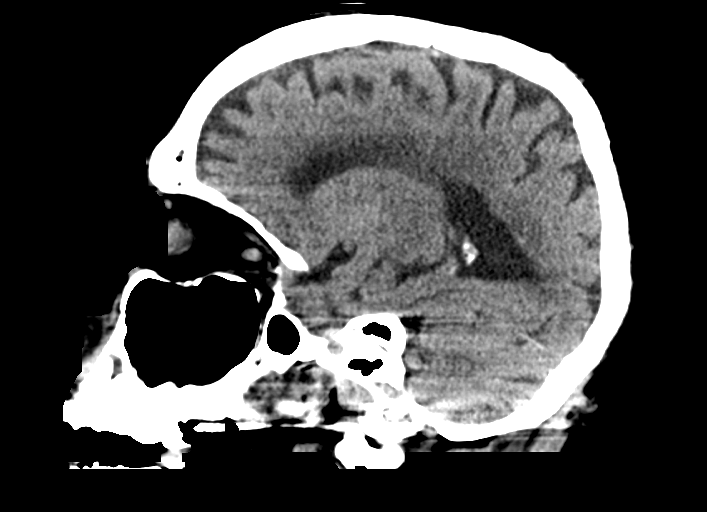

[15 of 47 positions shown; findings below may reference images not displayed]

FINDINGS: Brain: No evidence of acute infarction, hemorrhage, hydrocephalus,
extra-axial collection or mass lesion/mass effect. Advanced deep
white matter microangiopathy and moderate brain parenchymal volume
loss.

Vascular: No hyperdense vessel or unexpected calcification.

Skull: Normal. Negative for fracture or focal lesion.

Sinuses/Orbits: No acute finding.

Other: None.
IMPRESSION: 1. No acute intracranial abnormality.
2. Atrophy, chronic microvascular disease.

## 2021-02-11 IMAGING — CR DG CHEST 1V PORT
1 series · 1 of 1 positions shown · non-contrast
Comparison: [DATE]

CLINICAL DATA: Foot and ankle edema.

EXAM:
PORTABLE CHEST 1 VIEW

[x chest ap]
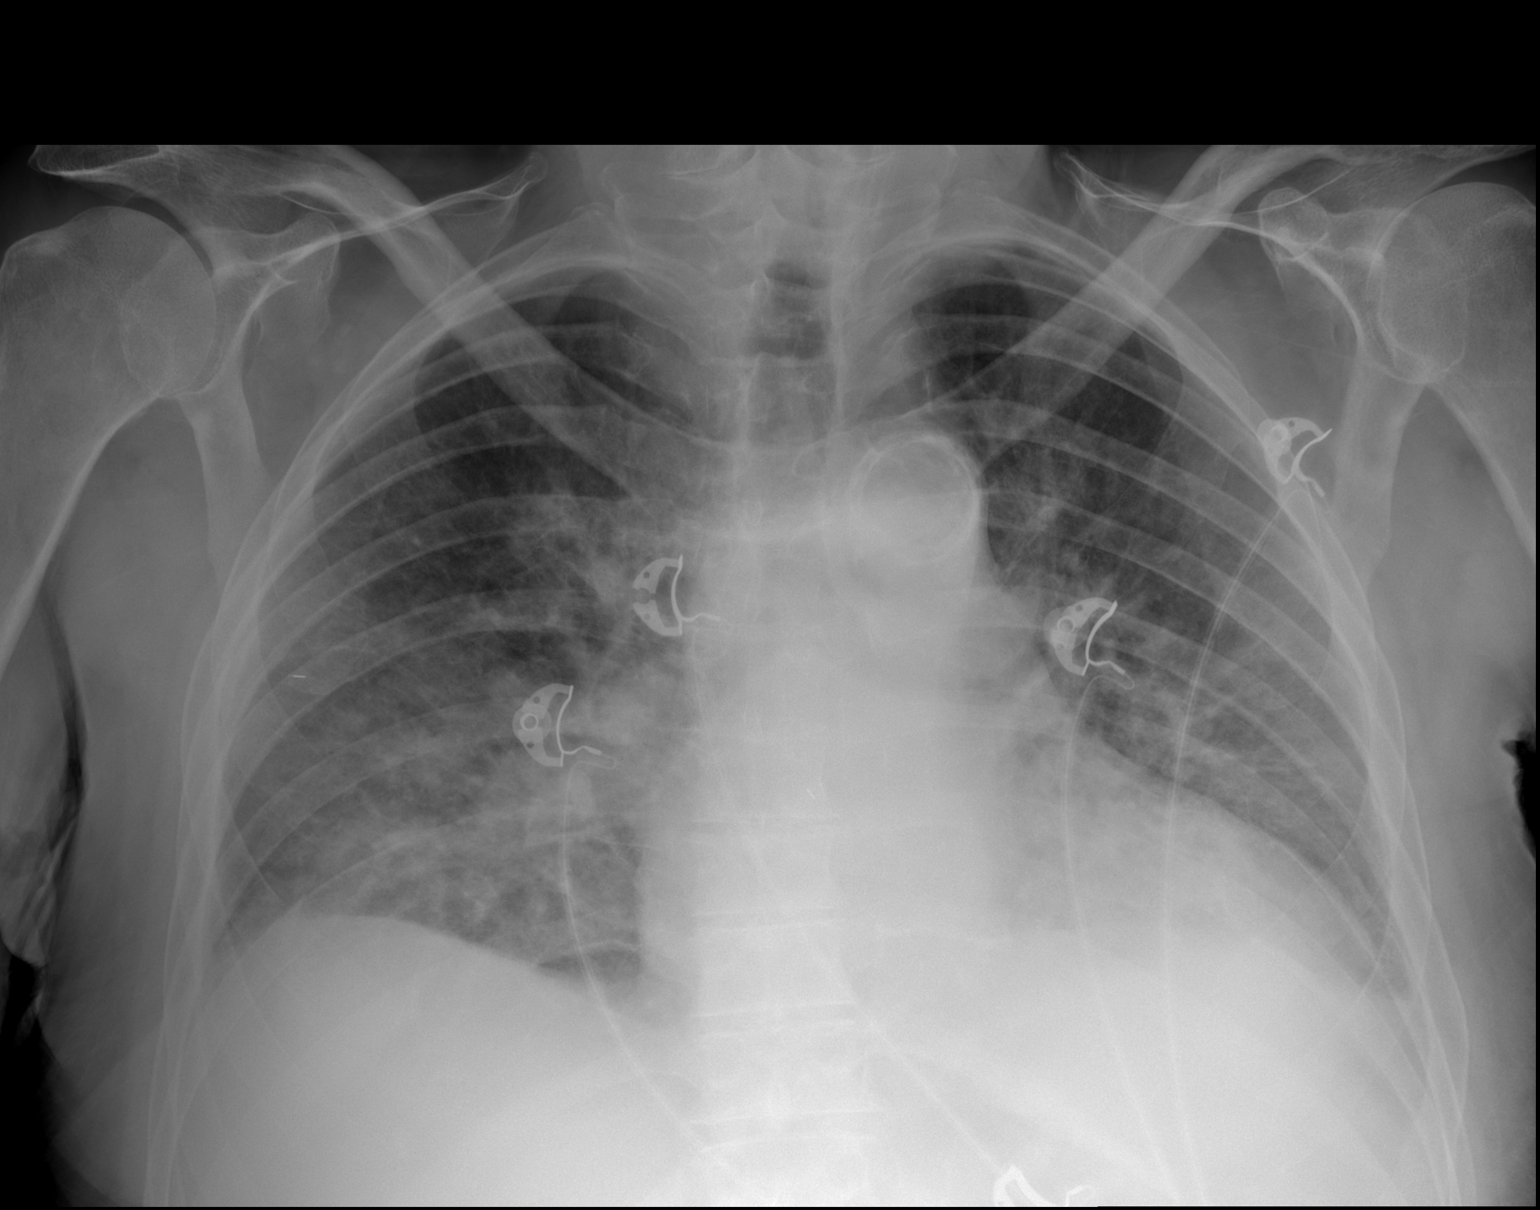

[1 of 1 positions shown; findings below may reference images not displayed]

FINDINGS: Increased cardiac silhouette.

Calcific atherosclerotic disease of the aorta.

Diffuse bilateral patchy and linear airspace opacities with central
predominance.

No pneumothorax.  Small left pleural effusion cannot be excluded.
IMPRESSION: 1. Diffuse bilateral patchy and linear airspace opacities with
central predominance may represent mixed pattern pulmonary edema or
less likely multifocal pneumonia.
2. Small left pleural effusion cannot be excluded.

## 2021-02-11 MED ORDER — FUROSEMIDE 10 MG/ML IJ SOLN
40.0000 mg | Freq: Once | INTRAMUSCULAR | Status: AC
  Administered 2021-02-11: 40 mg via INTRAVENOUS
  Filled 2021-02-11: qty 4

## 2021-02-11 MED ORDER — CLOTRIMAZOLE 1 % EX CREA: TOPICAL_CREAM | CUTANEOUS | 0 refills | Status: DC

## 2021-02-11 MED ORDER — FUROSEMIDE 20 MG PO TABS: 20.0000 mg | ORAL_TABLET | Freq: Every day | ORAL | 0 refills | Status: DC

## 2021-02-11 NOTE — Discharge Instructions (Addendum)
Take the lasix daily for the next three days to help with the increased swelling.  Then returned back to her usual dosing of taking as needed for weight gain.  Use the cream in the groin area to help with the rash

## 2021-02-11 NOTE — ED Triage Notes (Signed)
Pt BIB [facility] from [facility] for increasing confusion x1 day as well as bilateral ankle and foot edema. Takes lasiz PRN, does not know when his last dose was. Hx afib, no recent UTI, no other complaints. Denies urinary sx. A&Ox4.  BP 126/75 HR 74 SpO2 96% RA CBG 144

## 2021-02-11 NOTE — ED Provider Notes (Signed)
[facility] DEPT Provider Note   CSN: [id number] Arrival date & time: 02/11/21  1254     History Chief Complaint  Patient presents with  . Altered Mental Status    [name] [name] is a 87 y.o. male.  HPI   Patient presented to the ED for evaluation of confusion.  According to the EMS report patient has been confused for the last day.  They are also concerned about some leg swelling.  Patient himself states that his daughter thought he was not acting right so she wanted him to come to the ED.  He denies having any complaints.  He is not feeling confused.  He denies any headache.  He denies any fevers or chills.  He denies any chest pain or shortness of breath. Additional information provided by patient's daughter when she arrived.  She states the confusion she noticed for example was the patient initially asking for help taking his pants off but he was not wearing any pants.  When he asked to have his pants put back on and then taken off again and this happened several times despite her explaining to him that he was not currently wearing his pants.  She is does state that he is back to his baseline now. Past Medical History:  Diagnosis Date  . Acid reflux   . Angina   . Arrhythmia   . CAD (coronary artery disease), stent placed 11/26/11  11/27/2011  . Heart murmur   . Hypertension   . NSTEMI (non-ST elevated myocardial infarction) most likely as outpatient 11/24/2011  . Prostate cancer ([place])   . S/P angioplasty with stent, 11/26/11 distal AVGroove LCX into OM3 with BMS ([facility]) and rescue PTCA on OM2 jailed by stent. 11/27/2011    Patient Active Problem List   Diagnosis Date Noted  . Hyponatremia 12/21/2020  . Multiple fractures of ribs, bilateral, initial encounter for closed fracture 12/21/2020  . Falls 12/21/2020  . Acute kidney injury superimposed on chronic kidney disease ([place]) 12/21/2020  . Foreign body in stomach 12/21/2020  . Iron deficiency  anemia 12/21/2020  . Obesity (BMI 30-39.9) 12/21/2020  . Hypercalcemia 12/21/2020  . CAD (coronary artery disease), stent placed 11/26/11  11/27/2011  . S/P angioplasty with stent, 11/26/11 distal AVGroove LCX into OM3 with BMS ([facility]) and rescue PTCA on OM2 jailed by stent. 11/27/2011  . NSTEMI (non-ST elevated myocardial infarction) most likely as outpatient 11/24/2011  . CKD (chronic kidney disease) stage 3, GFR 30-59 ml/min (HCC) 11/24/2011  . Chest pain 11/23/2011  . HTN (hypertension) 11/23/2011  . A-fib, New onset 11/23/2011  . Acid reflux     Past Surgical History:  Procedure Laterality Date  . CARDIAC CATHETERIZATION    . CATARACT EXTRACTION W/ INTRAOCULAR LENS  IMPLANT, BILATERAL  ~ 2007  . ESOPHAGOGASTRODUODENOSCOPY N/A 12/23/2020   Procedure: ESOPHAGOGASTRODUODENOSCOPY (EGD);  Surgeon: Arta Silence, MD;  Location: [facility] ENDOSCOPY;  Service: Endoscopy;  Laterality: N/A;  . INSERTION PROSTATE RADIATION SEED  2001  . LEFT HEART CATHETERIZATION WITH CORONARY ANGIOGRAM N/A 11/26/2011   Procedure: LEFT HEART CATHETERIZATION WITH CORONARY ANGIOGRAM;  Surgeon: Sanda Klein, MD;  Location: [facility] CATH LAB;  Service: Cardiovascular;  Laterality: N/A;  . STAPEDECTOMY  1960; 1980   left; right       Family History  Problem Relation Age of Onset  . Hypertension Father     Social History   Tobacco Use  . Smoking status: Former Smoker    Packs/day: 0.50    Years:  14.00    Pack years: 7.00    Types: Cigars, Cigarettes    Quit date: 09/17/1978    Years since quitting: 42.4  . Smokeless tobacco: Never Used  Substance Use Topics  . Alcohol use: Yes    Comment: 11/23/11 "haven't drank anything  since ~ 2008"  . Drug use: No    Home Medications Prior to Admission medications   Medication Sig Start Date End Date Taking? Authorizing Provider  acetaminophen (TYLENOL) 325 MG tablet Take 650 mg by mouth every 8 (eight) hours as needed (pain).   Yes [provider]   allopurinol (ZYLOPRIM) 100 MG tablet Take 1 tablet (100 mg total) by mouth daily. 01/04/21  Yes Masoud, Jarrett Soho, MD  amLODipine (NORVASC) 5 MG tablet Take 5 mg by mouth daily.   Yes [provider]  apixaban (ELIQUIS) 2.5 MG TABS tablet Take 1 tablet (2.5 mg total) by mouth 2 (two) times daily. 01/04/21  Yes George Hugh, MD  aspirin EC 81 MG tablet Take 81 mg by mouth daily.   Yes [provider]  bimatoprost (LUMIGAN) 0.03 % ophthalmic solution Place 1 drop into both eyes every evening. 12/07/20  Yes [provider]  carvedilol (COREG) 25 MG tablet Take 25 mg by mouth 2 (two) times daily with a meal.   Yes [provider]  clotrimazole (LOTRIMIN) 1 % cream Apply to affected area 2 times daily 02/11/21  Yes Dorie Rank, MD  Dextran 70-Hypromellose, PF, 0.1-0.3 % SOLN Place 1 drop into both eyes 3 (three) times daily. Natural Tears PF   Yes [provider]  docusate sodium (COLACE) 100 MG capsule Take 100 mg by mouth daily as needed (constipation).   Yes [provider]  ferrous gluconate (FERGON) 324 MG tablet Take 324 mg by mouth 2 (two) times daily.   Yes [provider]  Lidocaine 4 % PTCH Apply 2 patches topically See admin instructions. Apply one patch to bilateral upper arms and another to left hip every morning,  remove at bedtime   Yes [provider]  Omega-3 Fatty Acids (FISH OIL) 1000 MG CAPS Take 1,000 mg by mouth daily.   Yes [provider]  pantoprazole (PROTONIX) 40 MG tablet Take 1 tablet (40 mg total) by mouth 2 (two) times daily before a meal. 01/04/21  Yes Masoud, Jarrett Soho, MD  Polyethyl Glyc-Propyl Glyc PF (SYSTANE ULTRA PF) 0.4-0.3 % SOLN Place 1 drop into both eyes daily.   Yes [provider]  rosuvastatin (CRESTOR) 5 MG tablet Take 5 mg by mouth See admin instructions. Take one tablet (5 mg) by mouth on Monday, Wednesday, Friday at bedtime   Yes [provider]  vitamin B-12  (CYANOCOBALAMIN) 1000 MCG tablet Take 1,000 mcg by mouth daily.   Yes [provider]  furosemide (LASIX) 20 MG tablet Take 1 tablet (20 mg total) by mouth daily for 3 days. Then return back to the dosing to be taken as needed for weight gain greater than 2 lbs in 24 hours 02/11/21 02/14/21  Dorie Rank, MD  lidocaine (LIDODERM) 5 % Place 1 patch onto the skin daily. Remove & Discard patch within 12 hours or as directed by MD Patient not taking: No sig reported 01/04/21   George Hugh, MD    Allergies    Hydrochlorothiazide and Morphine and related  Review of Systems   Review of Systems  All other systems reviewed and are negative.   Physical Exam Updated Vital Signs BP (!) 167/122  Pulse 70   Temp 97.7 F (36.5 C) (Oral)   Resp 15   Ht 1.829 m (6')   Wt 97.5 kg   SpO2 92%   BMI 29.16 kg/m   Physical Exam Vitals and nursing note reviewed.  Constitutional:      General: He is not in acute distress.    Appearance: He is well-developed. He is not diaphoretic.  HENT:     Head: Normocephalic and atraumatic.     Right Ear: External ear normal.     Left Ear: External ear normal.  Eyes:     General: No scleral icterus.       Right eye: No discharge.        Left eye: No discharge.     Conjunctiva/sclera: Conjunctivae normal.  Neck:     Trachea: No tracheal deviation.  Cardiovascular:     Rate and Rhythm: Normal rate and regular rhythm.  Pulmonary:     Effort: Pulmonary effort is normal. No respiratory distress.     Breath sounds: Normal breath sounds. No stridor. No wheezing or rales.  Abdominal:     General: Bowel sounds are normal. There is no distension.     Palpations: Abdomen is soft.     Tenderness: There is no abdominal tenderness. There is no guarding or rebound.  Musculoskeletal:        General: No tenderness.     Cervical back: Neck supple.     Right lower leg: Edema present.     Left lower leg: Edema present.  Skin:    General: Skin is warm and dry.      Findings: No rash.  Neurological:     Mental Status: He is alert and oriented to person, place, and time.     Cranial Nerves: No cranial nerve deficit (no facial droop, extraocular movements intact, no slurred speech).     Sensory: No sensory deficit.     Motor: No abnormal muscle tone or seizure activity.     Coordination: Coordination normal.     Comments: No pronator drift bilateral upper extrem, able to hold both legs off bed for 5 seconds, sensation intact in all extremities, no visual field cuts, no left or right sided neglect, normal finger-nose exam bilaterally, no nystagmus noted      ED Results / Procedures / Treatments   Labs (all labs ordered are listed, but only abnormal results are displayed) Labs Reviewed  PROTIME-INR - Abnormal; Notable for the following components:      Result Value   Prothrombin Time 18.1 (*)    INR 1.5 (*)    All other components within normal limits  APTT - Abnormal; Notable for the following components:   aPTT 40 (*)    All other components within normal limits  CBC - Abnormal; Notable for the following components:   RBC 2.78 (*)    Hemoglobin 8.5 (*)    HCT 27.1 (*)    RDW 17.1 (*)    Platelets 103 (*)    All other components within normal limits  COMPREHENSIVE METABOLIC PANEL - Abnormal; Notable for the following components:   Chloride 113 (*)    Glucose, Bld 102 (*)    BUN 60 (*)    Creatinine, Ser 3.70 (*)    Calcium 11.0 (*)    Albumin 3.4 (*)    AST 14 (*)    GFR, Estimated 15 (*)    All other components within normal limits  URINALYSIS, ROUTINE W REFLEX MICROSCOPIC -  Abnormal; Notable for the following components:   Protein, ur 30 (*)    All other components within normal limits  RESP PANEL BY RT-PCR (FLU A&B, COVID) ARPGX2  ETHANOL  DIFFERENTIAL  RAPID URINE DRUG SCREEN, HOSP PERFORMED    EKG EKG Interpretation  Date/Time:  Saturday Feb 11 2021 13:12:29 EDT Ventricular Rate:  69 PR Interval:    QRS  Duration: 146 QT Interval:  469 QTC Calculation: 503 R Axis:   39 Text Interpretation: Atrial fibrillation Right bundle branch block No significant change since last tracing Confirmed by Dorie Rank [phone]) on 02/11/2021 3:17:48 PM   Radiology CT HEAD WO CONTRAST  Result Date: 02/11/2021 CLINICAL DATA:  Increased confusion. EXAM: CT HEAD WITHOUT CONTRAST TECHNIQUE: Contiguous axial images were obtained from the base of the skull through the vertex without intravenous contrast. COMPARISON:  December 21, 2020 FINDINGS: Brain: No evidence of acute infarction, hemorrhage, hydrocephalus, extra-axial collection or mass lesion/mass effect. Advanced deep white matter microangiopathy and moderate brain parenchymal volume loss. Vascular: No hyperdense vessel or unexpected calcification. Skull: Normal. Negative for fracture or focal lesion. Sinuses/Orbits: No acute finding. Other: None. IMPRESSION: 1. No acute intracranial abnormality. 2. Atrophy, chronic microvascular disease. Electronically Signed   By: Fidela Salisbury M.D.   On: 02/11/2021 14:18   DG Chest Portable 1 View  Result Date: 02/11/2021 CLINICAL DATA:  Foot and ankle edema. EXAM: PORTABLE CHEST 1 VIEW COMPARISON:  November 23, 2011 FINDINGS: Increased cardiac silhouette. Calcific atherosclerotic disease of the aorta. Diffuse bilateral patchy and linear airspace opacities with central predominance. No pneumothorax.  Small left pleural effusion cannot be excluded. IMPRESSION: 1. Diffuse bilateral patchy and linear airspace opacities with central predominance may represent mixed pattern pulmonary edema or less likely multifocal pneumonia. 2. Small left pleural effusion cannot be excluded. Electronically Signed   By: Fidela Salisbury M.D.   On: 02/11/2021 14:20    Procedures Procedures   Medications Ordered in ED Medications  furosemide (LASIX) injection 40 mg (40 mg Intravenous Given 02/11/21 1654)    ED Course  I have reviewed the triage vital  signs and the nursing notes.  Pertinent labs & imaging results that were available during my care of the patient were reviewed by me and considered in my medical decision making (see chart for details).  Clinical Course as of 02/11/21 1902  Sat Feb 11, 2021  1623 Covid flu test are negative. [RX]  5400 She is anemic but this is similar to 1 month ago.  Hemoglobin was 9 previously [QQ]  7619 Patient does have chronic kidney disease.  This is similar to previous values [JK]  1624 Head CT without acute findings [JK]  1624 Chest x-ray suggest possible mixed pattern pulmonary edema versus pneumonia. [JK]  1841 Urinalysis without signs of infection.  UDS negative [JK]    Clinical Course User Index [JK] Dorie Rank, MD   MDM Rules/Calculators/A&P                          She presented to the ED for an episode of confusion.  Patient is currently at [facility] rehab.  They are trying to get him strong enough for pending surgery.  Patient had an episode of confusion although he is alert and oriented here in the ED.  There is no focal deficits to suggest TIA.  Patient's laboratory tests are abnormal but appear to be at his baseline.  He does have chronic uremia and kidney disease.  Family is also concerned about some fluid overload with increased leg swelling.  Patient's chest x-ray does show the possibility of pulmonary edema.  He currently is breathing easily and does not have an oxygen requirement.  He is not having any fevers or chills.  I doubt infection.  I suspect he does have a component of fluid overload.  He was given a dose of Lasix.  Family is concerned about him taking it too much as it can affect his kidneys will just have him take it for 3 days.  He had been taking the Lasix on an as-needed basis.  He does have a inguinal rash that is consistent with tinea cruris.  Patient otherwise appears stable for discharge back to the nursing facility and he is anxious to leave. Final Clinical Impression(s) /  ED Diagnoses Final diagnoses:  Chronic congestive heart failure, unspecified heart failure type ([place])  Tinea cruris    Rx / DC Orders ED Discharge Orders         Ordered    clotrimazole (LOTRIMIN) 1 % cream        02/11/21 1858    furosemide (LASIX) 20 MG tablet  Daily        02/11/21 1858           Dorie Rank, MD 02/11/21 1902

## 2021-02-14 ENCOUNTER — Telehealth: Payer: Self-pay | Admitting: Cardiovascular Disease

## 2021-02-14 NOTE — Telephone Encounter (Signed)
Follow Up:    [facility] is calling to check the status of pt's clearance. Please fax asap to [phone].

## 2021-02-14 NOTE — Telephone Encounter (Signed)
    [name] [name] DOB:  06-22-1933  MRN:  [id number]   Primary Cardiologist: Evalina Field, MD  Chart reviewed as part of pre-operative protocol coverage.  Preop status addressed at an outpatient visit with Dr. Audie Box 02/08/21. I have re-faxed the clearance to the requested number.   I will remove from the preop pool at this time.   Abigail Butts, PA-C 02/14/2021, 11:38 AM

## 2021-02-16 NOTE — Progress Notes (Signed)
Patient class changed in OR booking to ambulatory per Butch Penny at [facility] office

## 2021-02-19 NOTE — Pre-Procedure Instructions (Signed)
[name] [name]  02/19/2021     No Pharmacies Listed   Your procedure is scheduled on June 9  Report to [facility] Entrance A at 9:35 A.M.  Call this number if you have problems the morning of surgery:  [phone]   Remember:  Do not eat after midnight.   You may drink clear liquids until 9:35 A.M. .  Clear liquids allowed are:                    Water, Juice (non-citric and without pulp - diabetics please choose diet or no sugar options), Carbonated beverages - (diabetics please choose diet or no sugar options), Clear Tea, Black Coffee only (no creamer, milk or cream including half and half), Plain Jell-O only (diabetics please choose diet or no sugar options), Gatorade (diabetics please choose diet or no sugar options) and Plain Popsicles only      Enhanced Recovery after Surgery for Orthopedics Enhanced Recovery after Surgery is a protocol used to improve the stress on your body and your recovery after surgery.  Patient Instructions  . The day of surgery (if you do NOT have diabetes):  o Drink ONE (1) Pre-Surgery Clear Ensure by __9:35___ am the morning of surgery   o This drink was given to you during your hospital  pre-op appointment visit. o Nothing else to drink after completing the  Pre-Surgery Clear Ensure.          If you have questions, please contact your surgeon's office.     Take these medicines the morning of surgery with A SIP OF WATER :               Allopurinol (zyloprim)              Amlodipine (norvasc)              Carvedilol (coreg)              Eye drops              Pantoprazole (protonix)              Rosuvastatin (crestor)               If needed: tylenol              STOP ASPIRIN 5 DAYS PRIOR TO SURGERY             STOP ELIQUIS 48 HOURS BEFORE SURGERY               7 days prior to surgery STOP taking Aleve, Naproxen, Ibuprofen, Motrin, Advil, Goody's, BC's, all herbal medications, fish oil, and all vitamins.                 Do not wear  jewelry.  Do not wear lotions, powders, or perfumes, or deodorant.  Do not shave 48 hours prior to surgery.  Men may shave face and neck.  Do not bring valuables to the hospital.  [facility] is not responsible for any belongings or valuables.  Contacts, dentures or bridgework may not be worn into surgery.  Leave your suitcase in the car.  After surgery it may be brought to your room.  For patients admitted to the hospital, discharge time will be determined by your treatment team.  Patients discharged the day of surgery will not be allowed to drive home.    Special instructions:   [facility]- Preparing For Surgery  Before surgery, you  can play an important role. Because skin is not sterile, your skin needs to be as free of germs as possible. You can reduce the number of germs on your skin by washing with CHG (chlorahexidine gluconate) Soap before surgery.  CHG is an antiseptic cleaner which kills germs and bonds with the skin to continue killing germs even after washing.    Oral Hygiene is also important to reduce your risk of infection.  Remember - BRUSH YOUR TEETH THE MORNING OF SURGERY WITH YOUR REGULAR TOOTHPASTE  Please do not use if you have an allergy to CHG or antibacterial soaps. If your skin becomes reddened/irritated stop using the CHG.  Do not shave (including legs and underarms) for at least 48 hours prior to first CHG shower. It is OK to shave your face.  Please follow these instructions carefully.   1. Shower the NIGHT BEFORE SURGERY and the MORNING OF SURGERY with CHG.   2. If you chose to wash your hair, wash your hair first as usual with your normal shampoo.  3. After you shampoo, rinse your hair and body thoroughly to remove the shampoo.  4. Use CHG as you would any other liquid soap. You can apply CHG directly to the skin and wash gently with a scrungie or a clean washcloth.   5. Apply the CHG Soap to your body ONLY FROM THE NECK DOWN.  Do not use on open wounds  or open sores. Avoid contact with your eyes, ears, mouth and genitals (private parts). Wash Face and genitals (private parts)  with your normal soap.  6. Wash thoroughly, paying special attention to the area where your surgery will be performed.  7. Thoroughly rinse your body with warm water from the neck down.  8. DO NOT shower/wash with your normal soap after using and rinsing off the CHG Soap.  9. Pat yourself dry with a CLEAN TOWEL.  10. Wear CLEAN PAJAMAS to bed the night before surgery, wear comfortable clothes the morning of surgery  11. Place CLEAN SHEETS on your bed the night of your first shower and DO NOT SLEEP WITH PETS.    Day of Surgery:  Do not apply any deodorants/lotions.  Please wear clean clothes to the hospital/surgery center.   Remember to brush your teeth WITH YOUR REGULAR TOOTHPASTE.    Please read over the following fact sheets that you were given.

## 2021-02-20 ENCOUNTER — Other Ambulatory Visit (HOSPITAL_COMMUNITY): Payer: Self-pay

## 2021-02-20 ENCOUNTER — Emergency Department (HOSPITAL_COMMUNITY): Payer: Medicare Other

## 2021-02-20 ENCOUNTER — Encounter (HOSPITAL_COMMUNITY): Payer: Self-pay

## 2021-02-20 ENCOUNTER — Encounter (HOSPITAL_COMMUNITY): Payer: Self-pay | Admitting: Vascular Surgery

## 2021-02-20 ENCOUNTER — Other Ambulatory Visit: Payer: Self-pay

## 2021-02-20 ENCOUNTER — Encounter (HOSPITAL_COMMUNITY)
Admission: RE | Admit: 2021-02-20 | Discharge: 2021-02-20 | Disposition: A | Discharge status: Home / Self Care | Payer: Medicare Other | Source: Ambulatory Visit | Attending: Orthopedic Surgery | Admitting: Orthopedic Surgery

## 2021-02-20 ENCOUNTER — Inpatient Hospital Stay (HOSPITAL_COMMUNITY)
Admission: EM | Admit: 2021-02-20 | Discharge: 2021-02-23 | DRG: 871 | Disposition: A | Discharge status: Hospice | Payer: Medicare Other | Attending: Internal Medicine | Admitting: Internal Medicine

## 2021-02-20 DIAGNOSIS — N1832 Chronic kidney disease, stage 3b: Secondary | ICD-10-CM | POA: Diagnosis present

## 2021-02-20 DIAGNOSIS — I130 Hypertensive heart and chronic kidney disease with heart failure and stage 1 through stage 4 chronic kidney disease, or unspecified chronic kidney disease: Secondary | ICD-10-CM | POA: Diagnosis present

## 2021-02-20 DIAGNOSIS — K219 Gastro-esophageal reflux disease without esophagitis: Secondary | ICD-10-CM | POA: Diagnosis present

## 2021-02-20 DIAGNOSIS — R0902 Hypoxemia: Secondary | ICD-10-CM

## 2021-02-20 DIAGNOSIS — Z79899 Other long term (current) drug therapy: Secondary | ICD-10-CM

## 2021-02-20 DIAGNOSIS — Z87891 Personal history of nicotine dependence: Secondary | ICD-10-CM

## 2021-02-20 DIAGNOSIS — K5900 Constipation, unspecified: Secondary | ICD-10-CM

## 2021-02-20 DIAGNOSIS — G9341 Metabolic encephalopathy: Secondary | ICD-10-CM | POA: Diagnosis not present

## 2021-02-20 DIAGNOSIS — Z885 Allergy status to narcotic agent status: Secondary | ICD-10-CM

## 2021-02-20 DIAGNOSIS — I252 Old myocardial infarction: Secondary | ICD-10-CM

## 2021-02-20 DIAGNOSIS — Z95820 Peripheral vascular angioplasty status with implants and grafts: Secondary | ICD-10-CM

## 2021-02-20 DIAGNOSIS — R339 Retention of urine, unspecified: Secondary | ICD-10-CM | POA: Diagnosis present

## 2021-02-20 DIAGNOSIS — Z8546 Personal history of malignant neoplasm of prostate: Secondary | ICD-10-CM

## 2021-02-20 DIAGNOSIS — Z923 Personal history of irradiation: Secondary | ICD-10-CM

## 2021-02-20 DIAGNOSIS — A419 Sepsis, unspecified organism: Principal | ICD-10-CM | POA: Diagnosis present

## 2021-02-20 DIAGNOSIS — E870 Hyperosmolality and hypernatremia: Secondary | ICD-10-CM

## 2021-02-20 DIAGNOSIS — I2510 Atherosclerotic heart disease of native coronary artery without angina pectoris: Secondary | ICD-10-CM | POA: Diagnosis present

## 2021-02-20 DIAGNOSIS — I4821 Permanent atrial fibrillation: Secondary | ICD-10-CM | POA: Diagnosis present

## 2021-02-20 DIAGNOSIS — R092 Respiratory arrest: Secondary | ICD-10-CM

## 2021-02-20 DIAGNOSIS — Z66 Do not resuscitate: Secondary | ICD-10-CM | POA: Diagnosis present

## 2021-02-20 DIAGNOSIS — R195 Other fecal abnormalities: Secondary | ICD-10-CM | POA: Diagnosis present

## 2021-02-20 DIAGNOSIS — I4891 Unspecified atrial fibrillation: Secondary | ICD-10-CM

## 2021-02-20 DIAGNOSIS — R627 Adult failure to thrive: Secondary | ICD-10-CM

## 2021-02-20 DIAGNOSIS — Z8711 Personal history of peptic ulcer disease: Secondary | ICD-10-CM

## 2021-02-20 DIAGNOSIS — Z515 Encounter for palliative care: Secondary | ICD-10-CM

## 2021-02-20 DIAGNOSIS — Z888 Allergy status to other drugs, medicaments and biological substances status: Secondary | ICD-10-CM

## 2021-02-20 DIAGNOSIS — Z7189 Other specified counseling: Secondary | ICD-10-CM

## 2021-02-20 DIAGNOSIS — E860 Dehydration: Secondary | ICD-10-CM

## 2021-02-20 DIAGNOSIS — Z01812 Encounter for preprocedural laboratory examination: Secondary | ICD-10-CM | POA: Insufficient documentation

## 2021-02-20 DIAGNOSIS — J9601 Acute respiratory failure with hypoxia: Secondary | ICD-10-CM | POA: Diagnosis present

## 2021-02-20 DIAGNOSIS — E8770 Fluid overload, unspecified: Secondary | ICD-10-CM | POA: Diagnosis present

## 2021-02-20 DIAGNOSIS — Z7901 Long term (current) use of anticoagulants: Secondary | ICD-10-CM

## 2021-02-20 DIAGNOSIS — Z8249 Family history of ischemic heart disease and other diseases of the circulatory system: Secondary | ICD-10-CM

## 2021-02-20 DIAGNOSIS — Z7401 Bed confinement status: Secondary | ICD-10-CM

## 2021-02-20 DIAGNOSIS — R680 Hypothermia, not associated with low environmental temperature: Secondary | ICD-10-CM | POA: Diagnosis present

## 2021-02-20 DIAGNOSIS — N179 Acute kidney failure, unspecified: Secondary | ICD-10-CM | POA: Diagnosis present

## 2021-02-20 DIAGNOSIS — I5022 Chronic systolic (congestive) heart failure: Secondary | ICD-10-CM | POA: Diagnosis present

## 2021-02-20 DIAGNOSIS — I469 Cardiac arrest, cause unspecified: Secondary | ICD-10-CM | POA: Diagnosis not present

## 2021-02-20 DIAGNOSIS — F0390 Unspecified dementia without behavioral disturbance: Secondary | ICD-10-CM | POA: Diagnosis present

## 2021-02-20 DIAGNOSIS — R4182 Altered mental status, unspecified: Secondary | ICD-10-CM | POA: Diagnosis present

## 2021-02-20 DIAGNOSIS — D696 Thrombocytopenia, unspecified: Secondary | ICD-10-CM

## 2021-02-20 DIAGNOSIS — D631 Anemia in chronic kidney disease: Secondary | ICD-10-CM | POA: Diagnosis present

## 2021-02-20 DIAGNOSIS — D649 Anemia, unspecified: Secondary | ICD-10-CM

## 2021-02-20 DIAGNOSIS — T68XXXA Hypothermia, initial encounter: Secondary | ICD-10-CM | POA: Diagnosis present

## 2021-02-20 DIAGNOSIS — Z955 Presence of coronary angioplasty implant and graft: Secondary | ICD-10-CM

## 2021-02-20 DIAGNOSIS — Z7982 Long term (current) use of aspirin: Secondary | ICD-10-CM

## 2021-02-20 DIAGNOSIS — R296 Repeated falls: Secondary | ICD-10-CM | POA: Diagnosis present

## 2021-02-20 DIAGNOSIS — Z20822 Contact with and (suspected) exposure to covid-19: Secondary | ICD-10-CM | POA: Insufficient documentation

## 2021-02-20 DIAGNOSIS — M4802 Spinal stenosis, cervical region: Secondary | ICD-10-CM

## 2021-02-20 HISTORY — DX: Chronic kidney disease, unspecified: N18.9

## 2021-02-20 HISTORY — DX: Cardiac arrhythmia, unspecified: I49.9

## 2021-02-20 LAB — CBC WITH DIFFERENTIAL/PLATELET
Abs Immature Granulocytes: 0.06 10*3/uL (ref 0.00–0.07)
Abs Immature Granulocytes: 0.05 10*3/uL (ref 0.00–0.07)
Basophils Absolute: 0 10*3/uL (ref 0.0–0.1)
Basophils Absolute: 0 10*3/uL (ref 0.0–0.1)
Basophils Relative: 0 %
Basophils Relative: 1 %
Eosinophils Absolute: 0.4 10*3/uL (ref 0.0–0.5)
Eosinophils Absolute: 0.4 10*3/uL (ref 0.0–0.5)
Eosinophils Relative: 7 %
Eosinophils Relative: 7 %
HCT: 30.8 % — ABNORMAL LOW (ref 39.0–52.0)
HCT: 28.6 % — ABNORMAL LOW (ref 39.0–52.0)
Hemoglobin: 9.4 g/dL — ABNORMAL LOW (ref 13.0–17.0)
Hemoglobin: 8.9 g/dL — ABNORMAL LOW (ref 13.0–17.0)
Immature Granulocytes: 1 %
Immature Granulocytes: 1 %
Lymphocytes Relative: 22 %
Lymphocytes Relative: 26 %
Lymphs Abs: 1.1 10*3/uL (ref 0.7–4.0)
Lymphs Abs: 1.4 10*3/uL (ref 0.7–4.0)
MCH: 29.9 pg (ref 26.0–34.0)
MCH: 30.4 pg (ref 26.0–34.0)
MCHC: 30.5 g/dL (ref 30.0–36.0)
MCHC: 31.1 g/dL (ref 30.0–36.0)
MCV: 98.1 fL (ref 80.0–100.0)
MCV: 97.6 fL (ref 80.0–100.0)
Monocytes Absolute: 0.5 10*3/uL (ref 0.1–1.0)
Monocytes Absolute: 0.3 10*3/uL (ref 0.1–1.0)
Monocytes Relative: 9 %
Monocytes Relative: 6 %
Neutro Abs: 3.2 10*3/uL (ref 1.7–7.7)
Neutro Abs: 3.1 10*3/uL (ref 1.7–7.7)
Neutrophils Relative %: 61 %
Neutrophils Relative %: 59 %
Platelets: 57 10*3/uL — ABNORMAL LOW (ref 150–400)
Platelets: 65 10*3/uL — ABNORMAL LOW (ref 150–400)
RBC: 3.14 MIL/uL — ABNORMAL LOW (ref 4.22–5.81)
RBC: 2.93 MIL/uL — ABNORMAL LOW (ref 4.22–5.81)
RDW: 17.6 % — ABNORMAL HIGH (ref 11.5–15.5)
RDW: 17.6 % — ABNORMAL HIGH (ref 11.5–15.5)
WBC: 5.3 10*3/uL (ref 4.0–10.5)
WBC: 5.2 10*3/uL (ref 4.0–10.5)
nRBC: 0 % (ref 0.0–0.2)
nRBC: 0 % (ref 0.0–0.2)

## 2021-02-20 LAB — COMPREHENSIVE METABOLIC PANEL
ALT: 21 U/L (ref 0–44)
ALT: 20 U/L (ref 0–44)
AST: 17 U/L (ref 15–41)
AST: 18 U/L (ref 15–41)
Albumin: 3.3 g/dL — ABNORMAL LOW (ref 3.5–5.0)
Albumin: 3.4 g/dL — ABNORMAL LOW (ref 3.5–5.0)
Alkaline Phosphatase: 86 U/L (ref 38–126)
Alkaline Phosphatase: 79 U/L (ref 38–126)
Anion gap: 11 (ref 5–15)
Anion gap: 6 (ref 5–15)
BUN: 69 mg/dL — ABNORMAL HIGH (ref 8–23)
BUN: 73 mg/dL — ABNORMAL HIGH (ref 8–23)
CO2: 23 mmol/L (ref 22–32)
CO2: 25 mmol/L (ref 22–32)
Calcium: 10.7 mg/dL — ABNORMAL HIGH (ref 8.9–10.3)
Calcium: 10.2 mg/dL (ref 8.9–10.3)
Chloride: 113 mmol/L — ABNORMAL HIGH (ref 98–111)
Chloride: 116 mmol/L — ABNORMAL HIGH (ref 98–111)
Creatinine, Ser: 3.97 mg/dL — ABNORMAL HIGH (ref 0.61–1.24)
Creatinine, Ser: 3.82 mg/dL — ABNORMAL HIGH (ref 0.61–1.24)
GFR, Estimated: 14 mL/min — ABNORMAL LOW (ref >60)
GFR, Estimated: 15 mL/min — ABNORMAL LOW (ref >60)
Glucose, Bld: 85 mg/dL (ref 70–99)
Glucose, Bld: 157 mg/dL — ABNORMAL HIGH (ref 70–99)
Potassium: 4.1 mmol/L (ref 3.5–5.1)
Potassium: 4 mmol/L (ref 3.5–5.1)
Sodium: 147 mmol/L — ABNORMAL HIGH (ref 135–145)
Sodium: 147 mmol/L — ABNORMAL HIGH (ref 135–145)
Total Bilirubin: 1 mg/dL (ref 0.3–1.2)
Total Bilirubin: 0.8 mg/dL (ref 0.3–1.2)
Total Protein: 6.2 g/dL — ABNORMAL LOW (ref 6.5–8.1)
Total Protein: 6.1 g/dL — ABNORMAL LOW (ref 6.5–8.1)

## 2021-02-20 LAB — TYPE AND SCREEN
ABO/RH(D): O POS
Antibody Screen: NEGATIVE

## 2021-02-20 LAB — LACTIC ACID, PLASMA: Lactic Acid, Venous: 1.4 mmol/L (ref 0.5–1.9)

## 2021-02-20 LAB — CBG MONITORING, ED: Glucose-Capillary: 158 mg/dL — ABNORMAL HIGH (ref 70–99)

## 2021-02-20 LAB — URINALYSIS, ROUTINE W REFLEX MICROSCOPIC
Bilirubin Urine: NEGATIVE
Glucose, UA: NEGATIVE mg/dL
Hgb urine dipstick: NEGATIVE
Ketones, ur: NEGATIVE mg/dL
Leukocytes,Ua: NEGATIVE
Nitrite: NEGATIVE
Protein, ur: 30 mg/dL — AB
Specific Gravity, Urine: 1.013 (ref 1.005–1.030)
pH: 5 (ref 5.0–8.0)

## 2021-02-20 LAB — BLOOD GAS, ARTERIAL
Acid-base deficit: 0 mmol/L (ref 0.0–2.0)
Bicarbonate: 23.5 mmol/L (ref 20.0–28.0)
FIO2: 0.21
O2 Saturation: 97.9 %
Patient temperature: 98.6
pCO2 arterial: 35.3 mmHg (ref 32.0–48.0)
pH, Arterial: 7.438 (ref 7.350–7.450)
pO2, Arterial: 91.2 mmHg (ref 83.0–108.0)

## 2021-02-20 LAB — RESP PANEL BY RT-PCR (FLU A&B, COVID) ARPGX2
Influenza A by PCR: NEGATIVE
Influenza B by PCR: NEGATIVE
SARS Coronavirus 2 by RT PCR: NEGATIVE

## 2021-02-20 LAB — SURGICAL PCR SCREEN
MRSA, PCR: NEGATIVE
Staphylococcus aureus: NEGATIVE

## 2021-02-20 LAB — PROTIME-INR
INR: 1.5 — ABNORMAL HIGH (ref 0.8–1.2)
INR: 1.5 — ABNORMAL HIGH (ref 0.8–1.2)
Prothrombin Time: 17.9 seconds — ABNORMAL HIGH (ref 11.4–15.2)
Prothrombin Time: 17.7 seconds — ABNORMAL HIGH (ref 11.4–15.2)

## 2021-02-20 LAB — APTT: aPTT: 47 seconds — ABNORMAL HIGH (ref 24–36)

## 2021-02-20 IMAGING — CT CT CERVICAL SPINE W/O CM
3 of 5 series · 10 of 33 positions shown, 12 images · non-contrast
Comparison: [DATE]

CLINICAL DATA: Progressive cervical spinal stenosis

EXAM:
CT CERVICAL SPINE WITHOUT CONTRAST
TECHNIQUE: Multidetector CT imaging of the cervical spine was performed without
intravenous contrast. Multiplanar CT image reconstructions were also
generated.

[Series 8: orthogonal bone · axial · 0.23mm/px · z∈[-106,-2]mm · 2 of 119 slices shown, 3 images]
[im 30/119  soft-tissue]
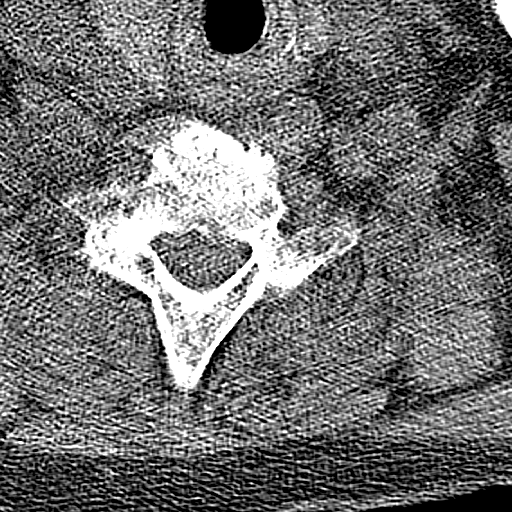
[im 30/119  bone]
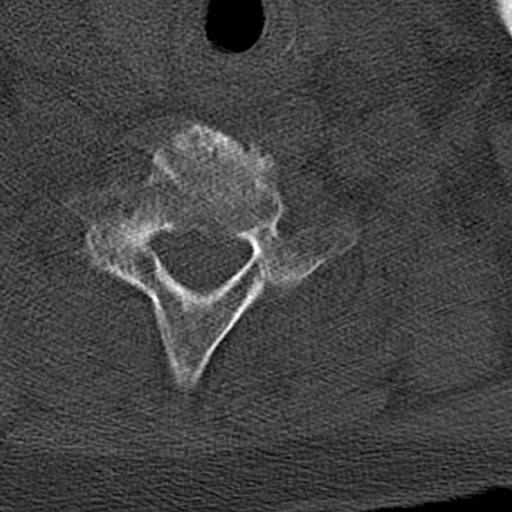
[im 89/119  bone]
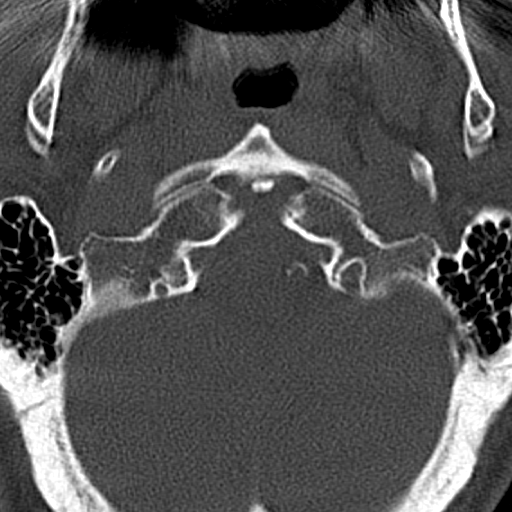

[Series 10: sagittal bone · sagittal · 0.25mm/px · 5 of 61 slices shown, 6 images]
[im 21/61  bone]
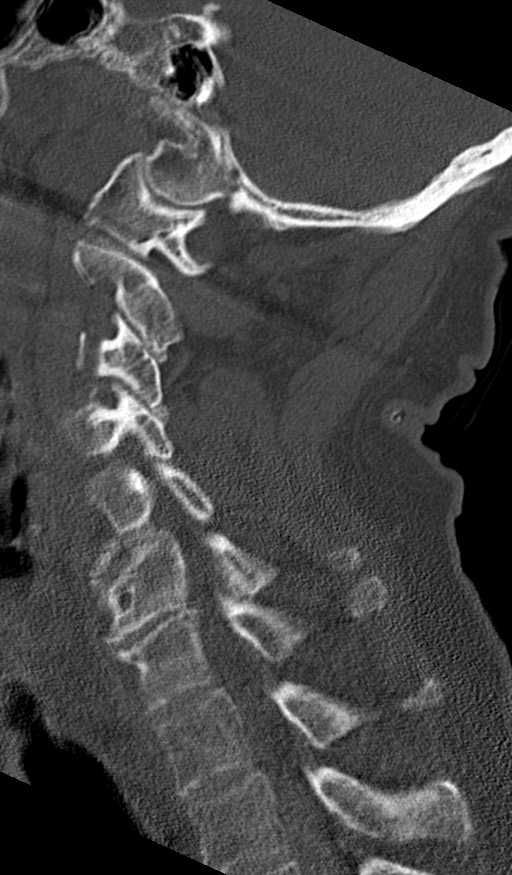
[im 26/61  bone]
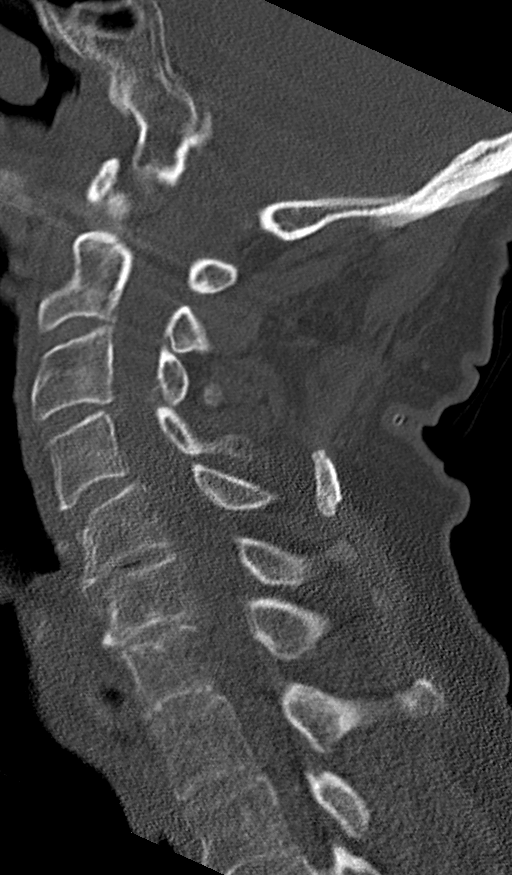
[im 31/61  soft-tissue]
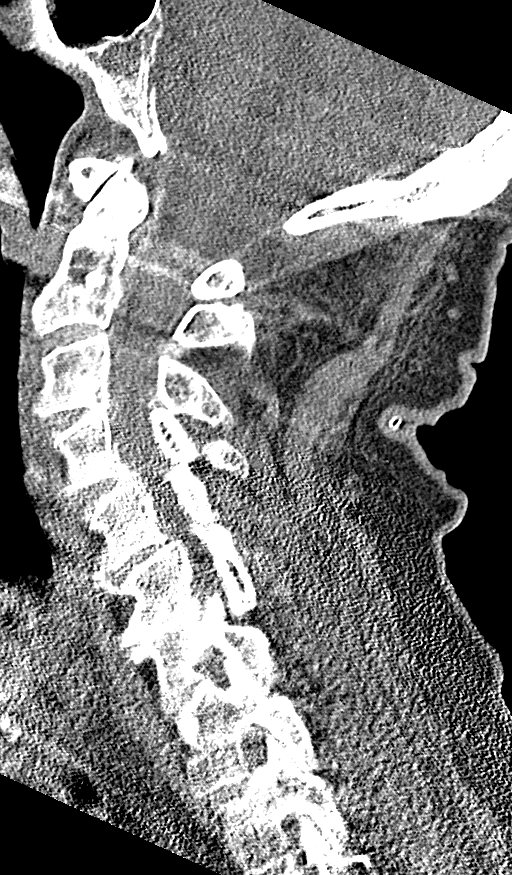
[im 31/61  bone]
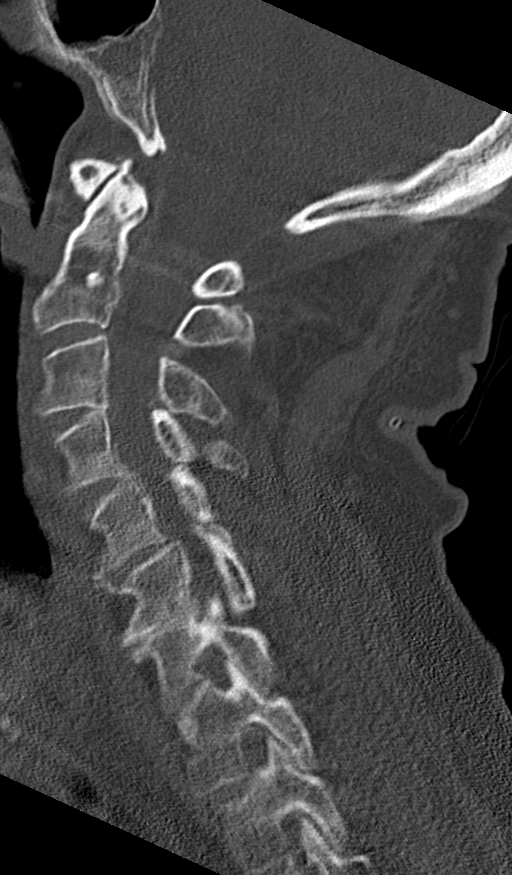
[im 36/61  bone]
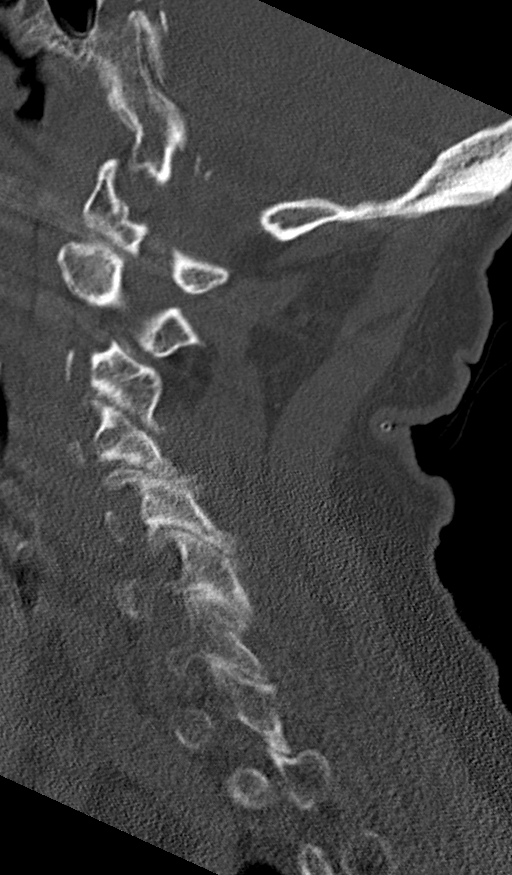
[im 41/61  bone]
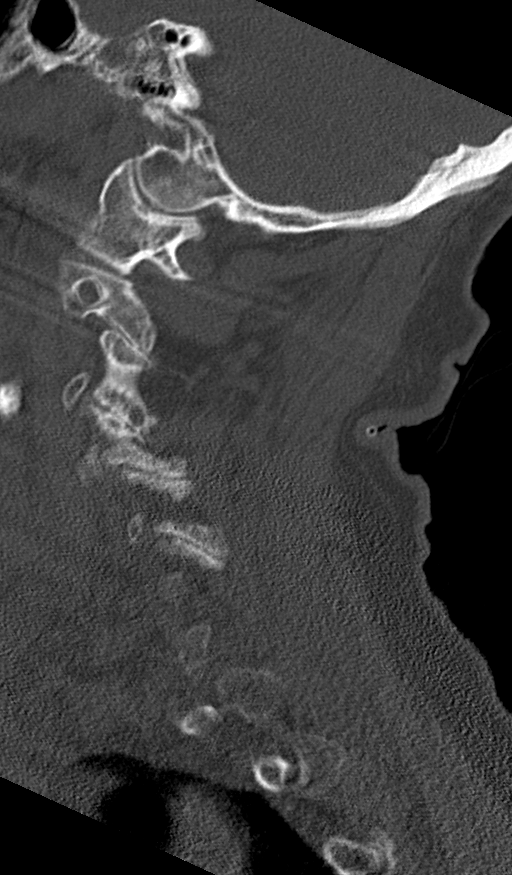

[Series 11: coronal bone 2 · coronal · 0.24mm/px · 3 of 84 slices shown]
[im 17/84  bone]
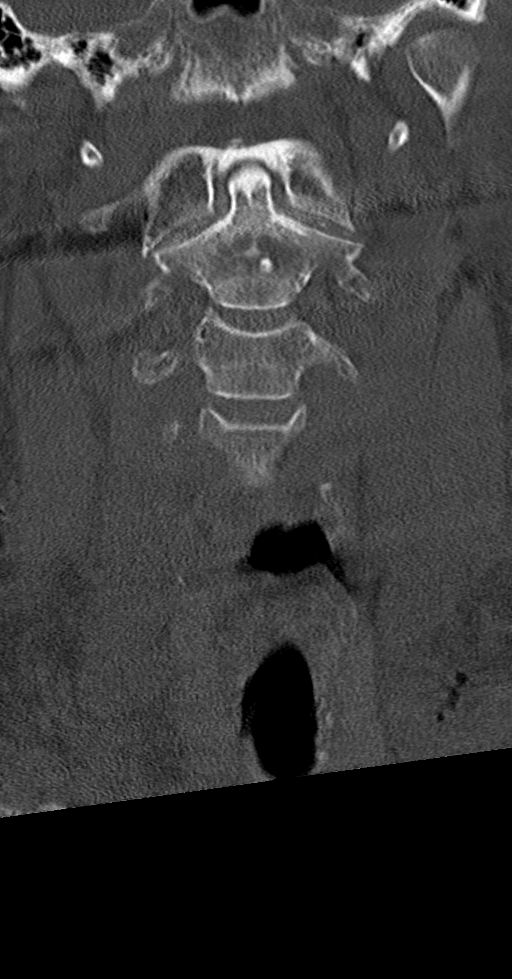
[im 34/84  bone]
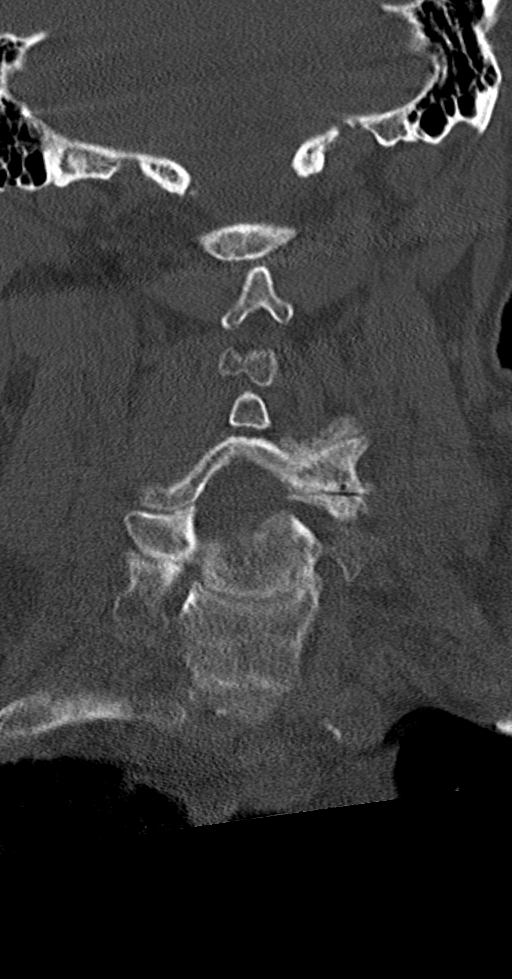
[im 50/84  bone]
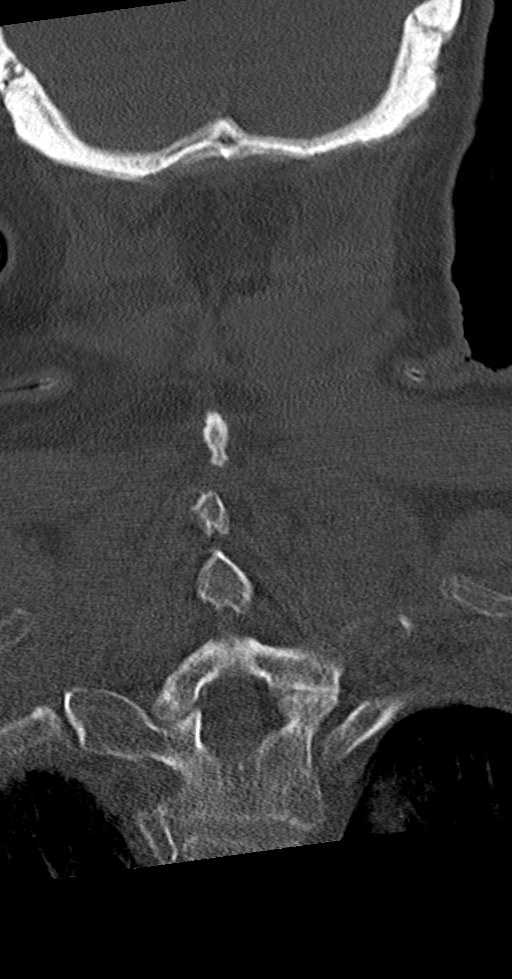

[10 of 33 positions shown; findings below may reference images not displayed]

FINDINGS: Alignment: Normal cervical lordosis. 3-4 mm anterolisthesis of C4
upon C5 is stable, likely degenerative in nature.

Skull base and vertebrae: Craniocervical alignment is normal. The
atlantodental interval is not widened. No acute fracture of the
cervical spine.

Soft tissues and spinal canal: No prevertebral fluid or swelling. No
visible canal hematoma.

Disc levels: There is intervertebral disc space narrowing and
endplate remodeling at C5-C7 in keeping with changes of moderate
degenerative disc disease. Remaining intervertebral disc heights are
preserved. The prevertebral soft tissues are not thickened on
sagittal reformats. Review of the axial images demonstrates
multilevel uncovertebral and facet arthrosis resulting in moderate
to severe bilateral neuroforaminal narrowing at C3-4, right greater
than left, moderate to severe left neuroforaminal narrowing at C4-5,
moderate left neuroforaminal narrowing at C5-6, and moderate to
severe bilateral neuroforaminal narrowing at C6-7, left greater than
right. Grade 1 anterolisthesis of C4 upon C5 contributes to mild
central canal stenosis at this level. Posterior disc osteophyte
complex at C6-7 results in mild central canal stenosis at this
level. These changes appears stable since prior examination.

Upper chest: Limited by motion artifact, but unremarkable

Other: Moderate bilateral carotid bifurcation calcification.
IMPRESSION: No acute fracture or listhesis of the cervical spine.

Multilevel degenerative disc and degenerative joint disease with
grade 1 anterolisthesis of C4 upon C5, stable since prior
examination.

## 2021-02-20 IMAGING — DX DG CHEST 1V PORT
1 series · 1 of 1 positions shown · non-contrast
Comparison: [DATE]

CLINICAL DATA: Altered mental status, hypotension

EXAM:
PORTABLE CHEST 1 VIEW

[chest ap]
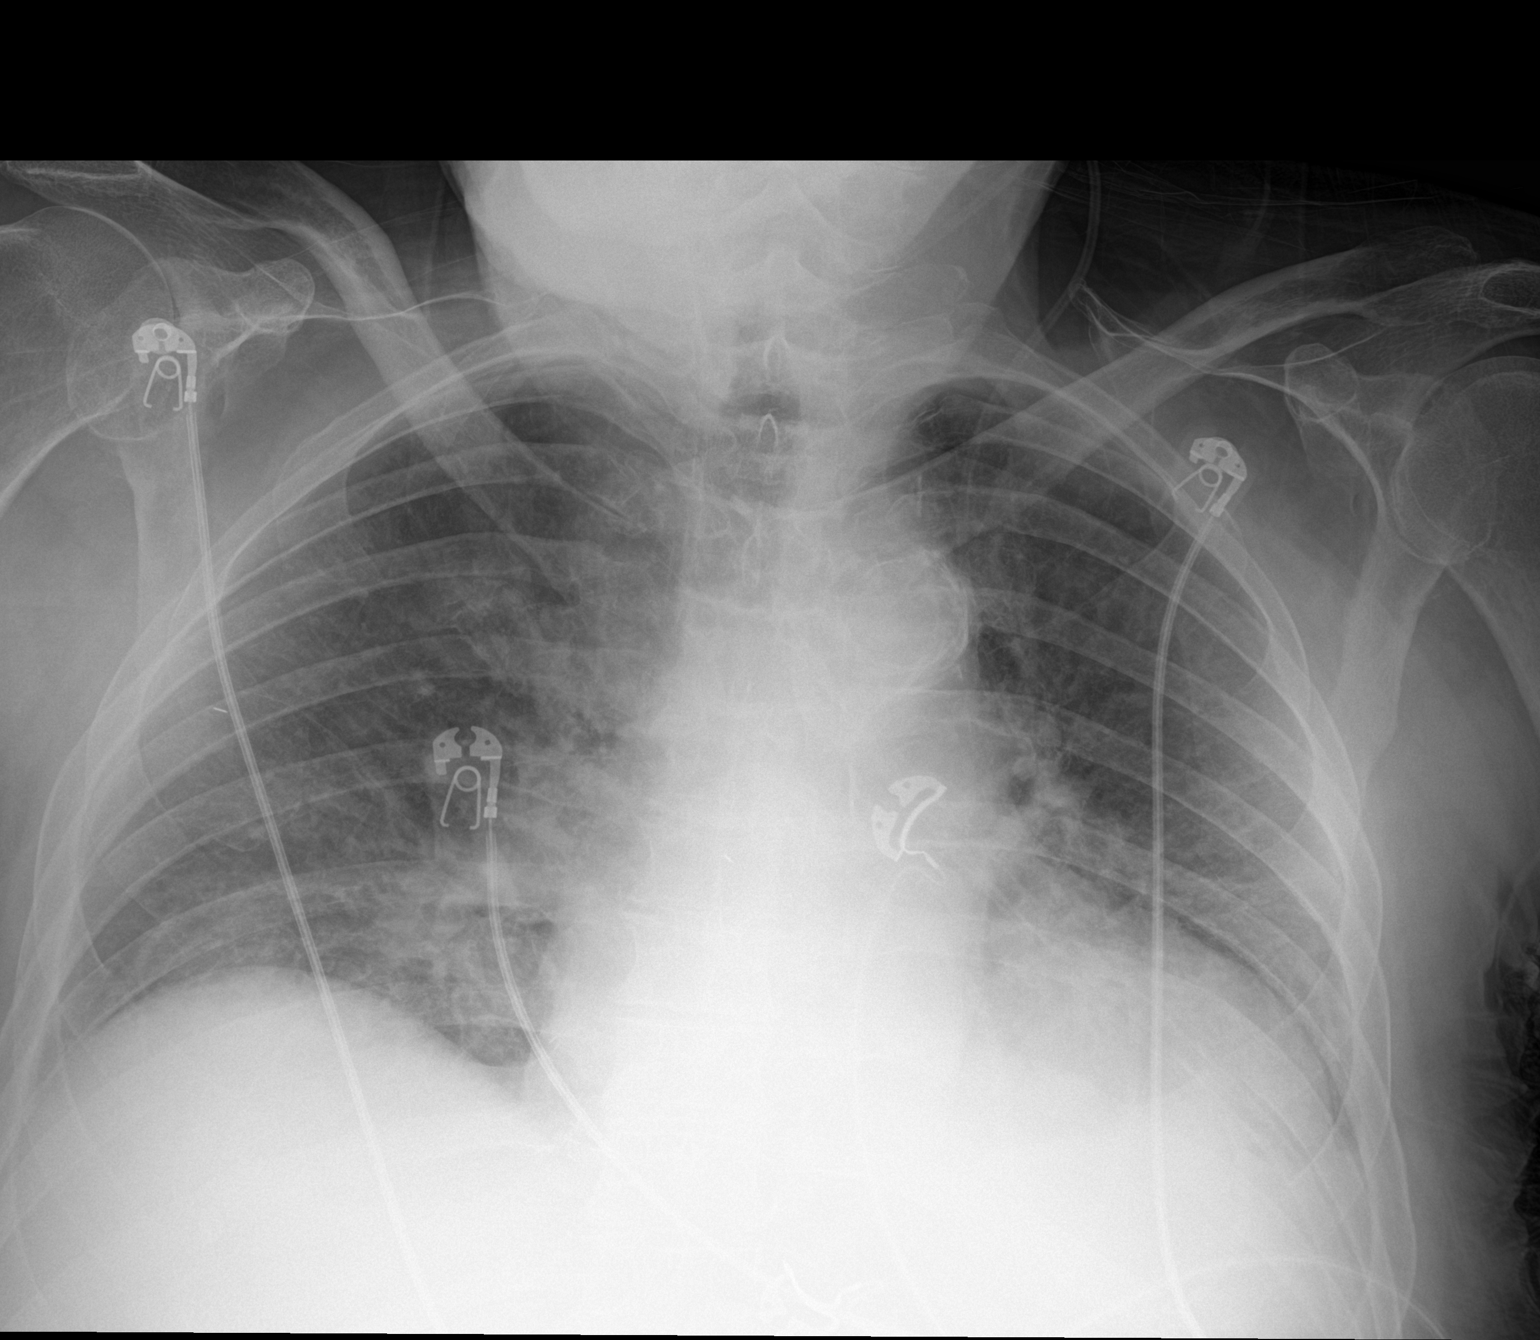

[1 of 1 positions shown; findings below may reference images not displayed]

FINDINGS: Cardiomegaly. Atherosclerotic calcification of the aortic knob. Low
lung volumes. Pulmonary vascular congestion with bilateral perihilar
interstitial opacities. No large pleural fluid collection. No
pneumothorax is seen.
IMPRESSION: Findings suggest CHF with pulmonary edema. A superimposed infectious
process would be difficult to exclude.

## 2021-02-20 IMAGING — CT CT HEAD W/O CM
3 of 6 series · 15 of 47 positions shown, 18 images · non-contrast
Comparison: [DATE]

CLINICAL DATA: Thrombocytopenia, altered mental status

EXAM:
CT HEAD WITHOUT CONTRAST
TECHNIQUE: Contiguous axial images were obtained from the base of the skull
through the vertex without intravenous contrast.

[Series 7: head wo · axial · 0.50mm/px · z∈[+9,+159]mm · 10 of 36 slices shown, 13 images]
[im 3/36  brain]
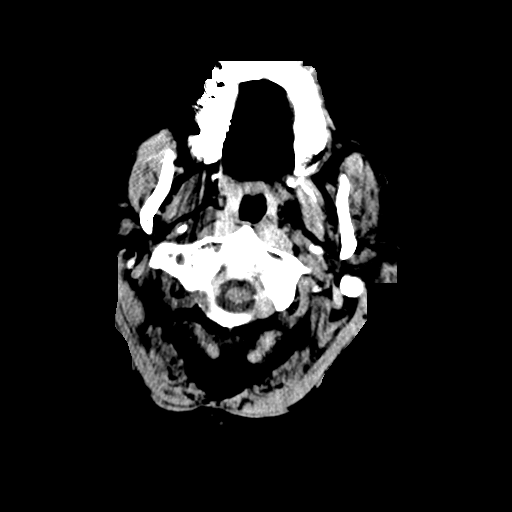
[im 3/36  bone]
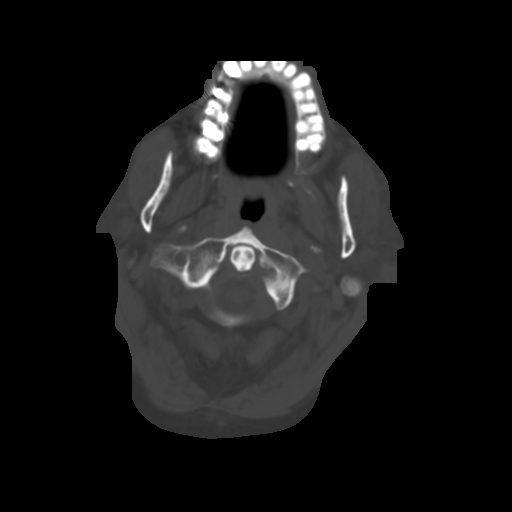
[im 6/36  brain]
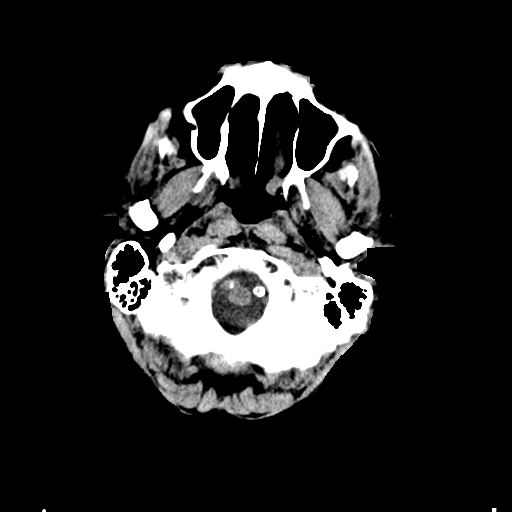
[im 11/36  brain]
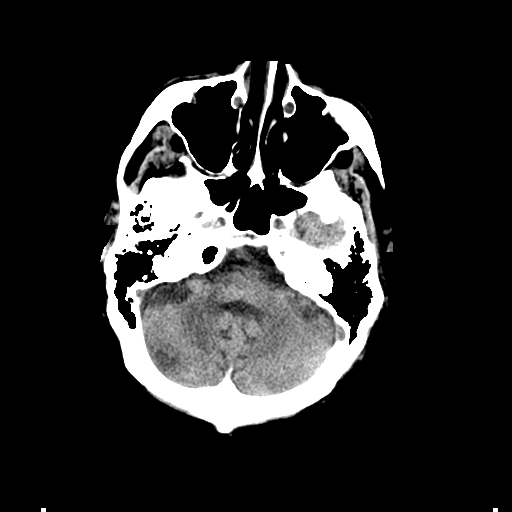
[im 13/36  brain]
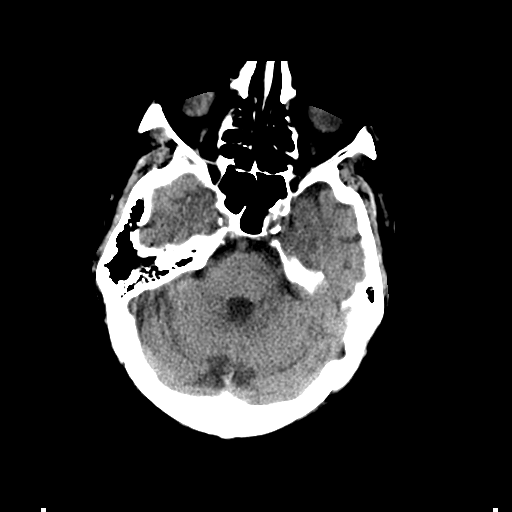
[im 16/36  brain]
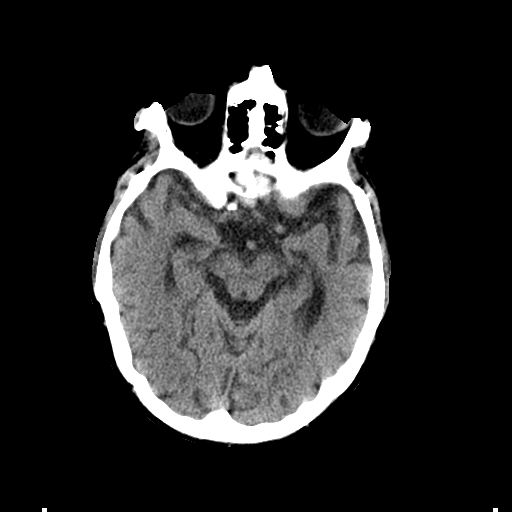
[im 16/36  bone]
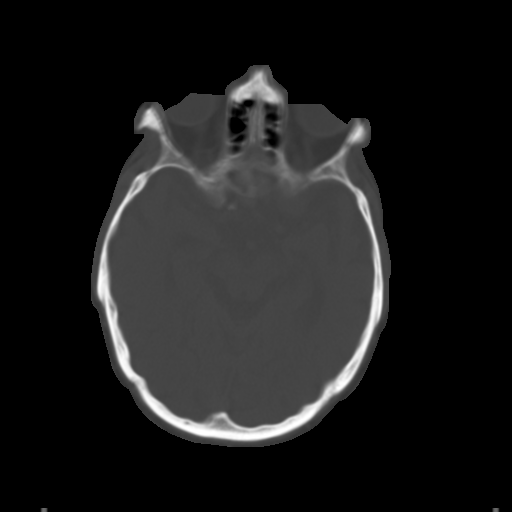
[im 21/36  brain]
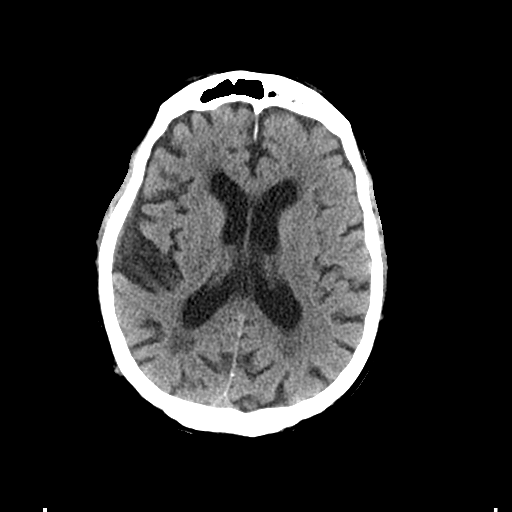
[im 23/36  brain]
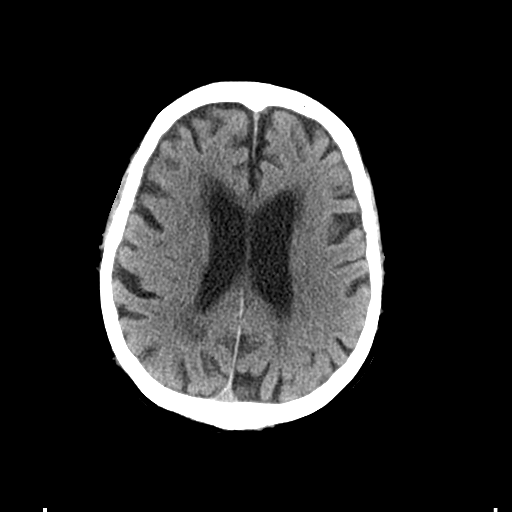
[im 26/36  brain]
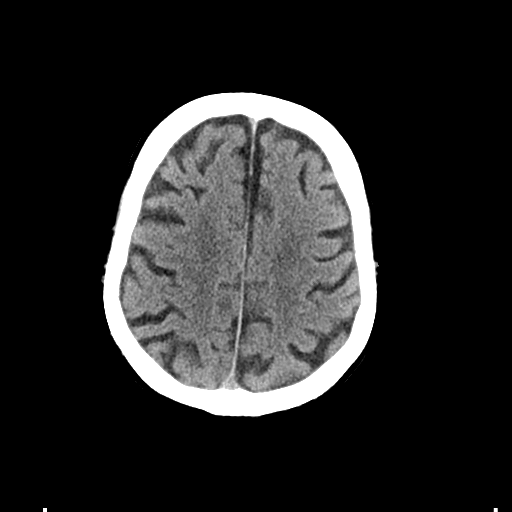
[im 31/36  brain]
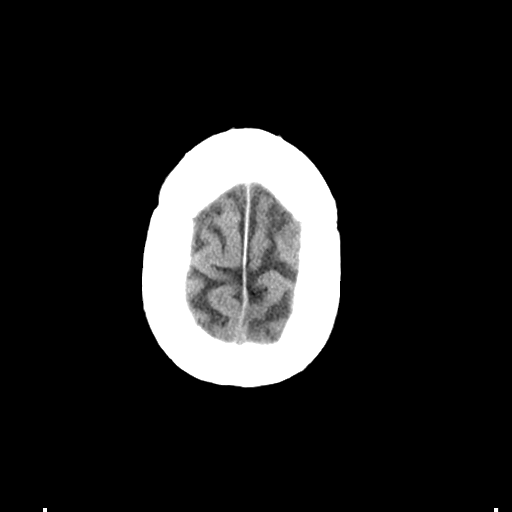
[im 31/36  bone]
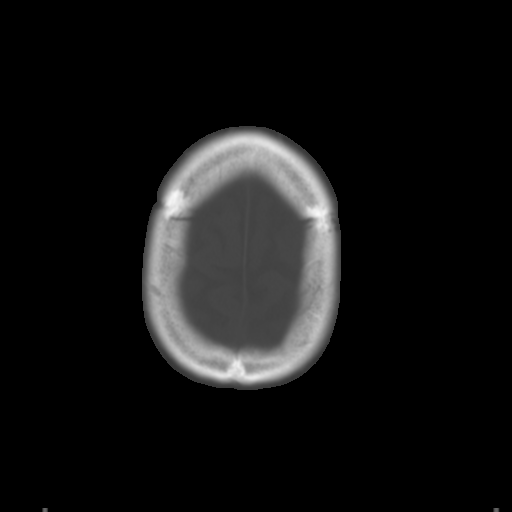
[im 33/36  brain]
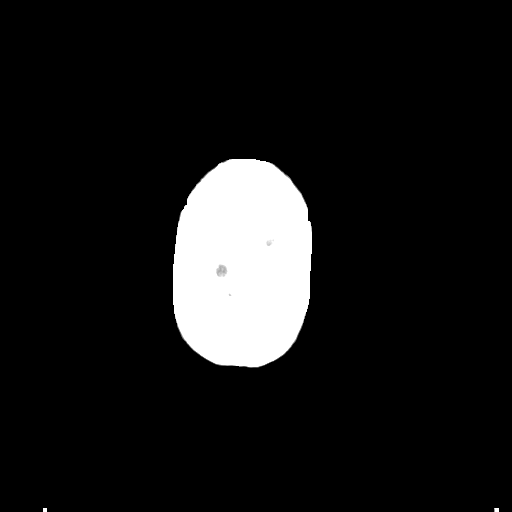

[Series 11: coronal soft tissue · coronal · 0.34mm/px · 3 of 77 slices shown]
[im 20/77  brain]
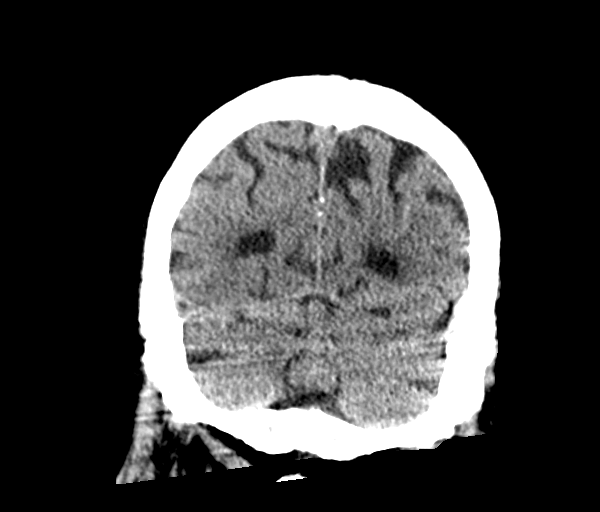
[im 39/77  brain]
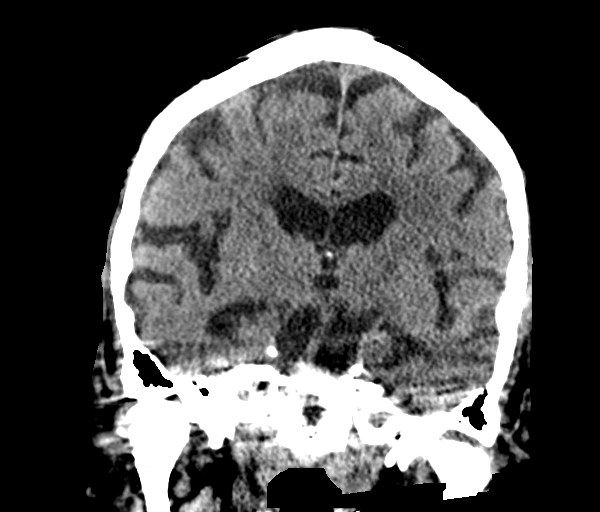
[im 58/77  brain]
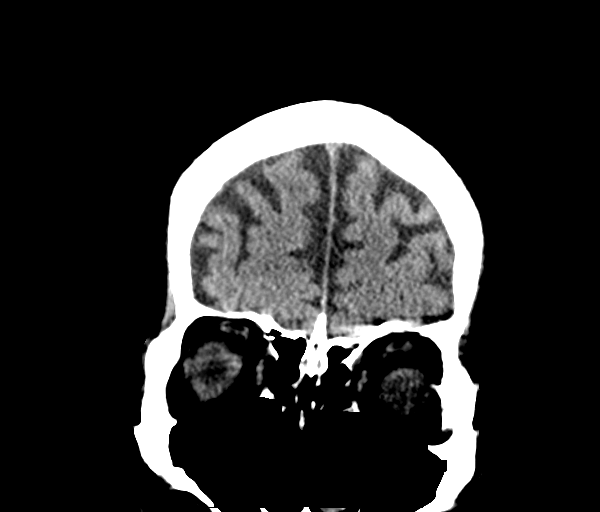

[Series 12: sagittal soft tissue · sagittal · 0.34mm/px · 2 of 63 slices shown]
[im 21/63  brain]
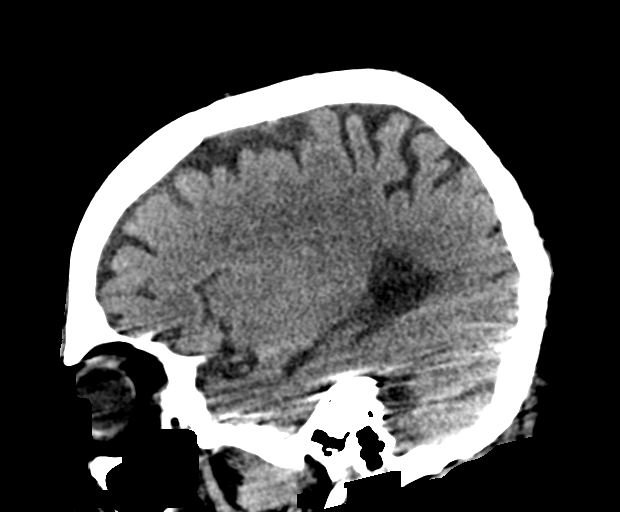
[im 42/63  brain]
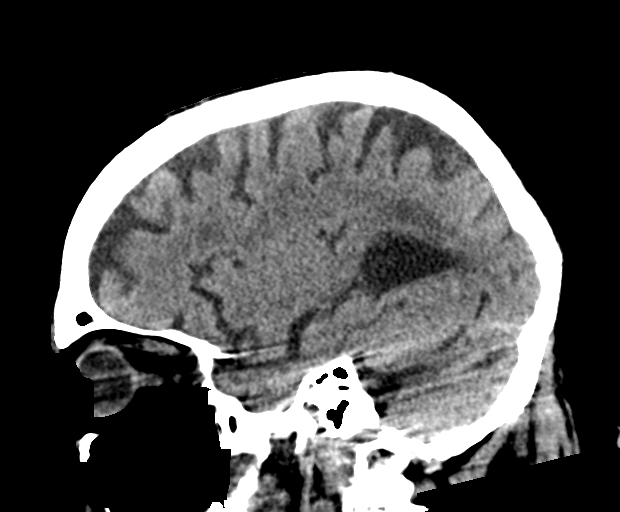

[15 of 47 positions shown; findings below may reference images not displayed]

FINDINGS: Brain: Normal anatomic configuration. Parenchymal volume loss is
commensurate with the patient's age. Mild periventricular white
matter changes are present likely reflecting the sequela of small
vessel ischemia. No abnormal intra or extra-axial mass lesion or
fluid collection. No abnormal mass effect or midline shift. No
evidence of acute intracranial hemorrhage or infarct. Ventricular
size is normal. Cerebellum unremarkable.

Vascular: No asymmetric hyperdense vasculature at the skull base.

Skull: Intact

Sinuses/Orbits: Paranasal sinuses are clear. Orbits are
unremarkable.

Other: Mastoid air cells and middle ear cavities are clear.
IMPRESSION: No acute intracranial hemorrhage or infarct.

Stable senescent change.

## 2021-02-20 MED ORDER — SODIUM CHLORIDE 0.9 % IV BOLUS
1000.0000 mL | Freq: Once | INTRAVENOUS | Status: AC
  Administered 2021-02-20: 1000 mL via INTRAVENOUS

## 2021-02-20 MED ORDER — SODIUM CHLORIDE 0.9 % IV SOLN: Freq: Once | INTRAVENOUS | Status: AC

## 2021-02-20 NOTE — ED Notes (Signed)
Per Dr. Gilford Raid, pt can be unhooked from bair hugger to go to CT at this time. CT called and aware.

## 2021-02-20 NOTE — Progress Notes (Addendum)
[employer] sent to Dr. Lynann Bologna regarding abnormal labs including Platelets at 57.  Spoke to [place], [place] who asked me to call Butch Penny at [facility] office.  Left her a message and my number to call me back.

## 2021-02-20 NOTE — Progress Notes (Addendum)
PCP: Antony Contras, MD Cardiologist: Eleonore Chiquito, MD  EKG: 02/14/21 CXR: 02/11/21 ECHO: 12/22/20 Stress Test: denies Cardiac Cath: 11/27/11  Fasting Blood Sugar- na Checks Blood Sugar__na_ times a day  OSA/CPAP: No  ASA: stop 5 days prior to surgery Blood Thinners: Eliquis stop 48 hours prior to surgery  Covid test 02/20/21 at PAT  Anesthesia Review: Yes, CAD with stent, afib. Abnormal labs.  Notified Dr. Lynann Bologna. Cardiac clearance in media 5/24  Patient denies shortness of breath, fever, cough, and chest pain at PAT appointment.  Patient verbalized understanding of instructions provided today at the PAT appointment.  Patient asked to review instructions at home and day of surgery.

## 2021-02-20 NOTE — ED Notes (Signed)
Pt going to CT

## 2021-02-20 NOTE — ED Notes (Signed)
Pt returned from CT °

## 2021-02-20 NOTE — ED Notes (Signed)
Bair hugger in place.

## 2021-02-20 NOTE — ED Notes (Signed)
Dr. Gilford Raid aware pt bradycardic with HR 40-60. Currently 49 bpm.

## 2021-02-20 NOTE — ED Notes (Signed)
Pt very lethargic, pt not responding to voice. Pt responds to movement of his limbs. Pt 100% RA. Pt cold to touch. Dr. Gilford Raid aware.

## 2021-02-20 NOTE — ED Notes (Signed)
ED Provider at bedside. 

## 2021-02-20 NOTE — ED Triage Notes (Signed)
Pt BIB [facility]. Pt is scheduled for cervical surgery with [facility], in prep for that his platelet count is found to be low, and pt is supposed to be not taking his Eliquis. Family does not want pt given Eliquis. Family c/o pt to be lethargic, dysuria. Pt son states he has not been drinking water. Pt takes beta blocker, EMS reports pt to have 50 HR. Pt has hx of A fib.  100% RA EMS reports pt to be extremely lethargic, pinpoint pupils EMS gave 2 mg IV Narcan (with some relief short term)  EMS gave 250 mL NS  Family reports pt is normally alert and verbal. Lethargy started a few days ago. Pt nonambulatory at this time.

## 2021-02-20 NOTE — Progress Notes (Signed)
Spoke to [place] at Dr. Laurena Bering office and reviewed all abnormal labs.  She is going to inform him of abnormal labs today and stated she would call Ebony Hail, [place] tomorrow.

## 2021-02-20 NOTE — ED Notes (Signed)
Dr. Gilford Raid aware pt rectal temp is 93.2, bare hugger in place. Warm blankets on top of bare hugger.

## 2021-02-20 NOTE — ED Notes (Signed)
CT asked to hold off, pt foley temp reading 93.2 with bair hugger on. This pt does not need to be disconnected from bair hugger at this time. Dr. Gilford Raid aware. Pt started on warm fluids.

## 2021-02-20 NOTE — ED Provider Notes (Signed)
[facility] DEPT Provider Note   CSN: [id number] Arrival date & time: 02/20/21  1905     History Chief Complaint  Patient presents with  . Fatigue    [name] [name] is a 87 y.o. male.  Pt presents to the ED with AMS.  The pt is scheduled for a cervical fusion with Dr. Lynann Bologna on 6/9.  Pt has had progressive weakness of his arms and his legs due to spinal stenosis.   He had pre op labs today which showed a low plt level.  Pt has not been eating or drinking much.  He has been confused.  EMS gave pt 2 mg of narcan IV because he had some apneic spells en route.  He was also given 250 cc urine.  EMS applied 15L NRB to patient, but RA O2 sats were good.    Pt's son said he's fallen several times at [place].  He is on Eliquis.  His mattress is basically on the floor.  Pt's son said he was alert the last time he had seen pt.        Past Medical History:  Diagnosis Date  . Acid reflux   . Angina   . Arrhythmia   . CAD (coronary artery disease), stent placed 11/26/11  11/27/2011  . Chronic kidney disease   . Dysrhythmia    afib  . Heart murmur   . Hypertension   . NSTEMI (non-ST elevated myocardial infarction) most likely as outpatient 11/24/2011  . Prostate cancer ([place])   . S/P angioplasty with stent, 11/26/11 distal AVGroove LCX into OM3 with BMS (Integrity) and rescue PTCA on OM2 jailed by stent. 11/27/2011    Patient Active Problem List   Diagnosis Date Noted  . Hyponatremia 12/21/2020  . Multiple fractures of ribs, bilateral, initial encounter for closed fracture 12/21/2020  . Falls 12/21/2020  . Acute kidney injury superimposed on chronic kidney disease ([place]) 12/21/2020  . Foreign body in stomach 12/21/2020  . Iron deficiency anemia 12/21/2020  . Obesity (BMI 30-39.9) 12/21/2020  . Hypercalcemia 12/21/2020  . CAD (coronary artery disease), stent placed 11/26/11  11/27/2011  . S/P angioplasty with stent, 11/26/11 distal AVGroove LCX into OM3 with BMS  (Integrity) and rescue PTCA on OM2 jailed by stent. 11/27/2011  . NSTEMI (non-ST elevated myocardial infarction) most likely as outpatient 11/24/2011  . CKD (chronic kidney disease) stage 3, GFR 30-59 ml/min (HCC) 11/24/2011  . Chest pain 11/23/2011  . HTN (hypertension) 11/23/2011  . A-fib, New onset 11/23/2011  . Acid reflux     Past Surgical History:  Procedure Laterality Date  . CARDIAC CATHETERIZATION    . CATARACT EXTRACTION W/ INTRAOCULAR LENS  IMPLANT, BILATERAL  ~ 2007  . ESOPHAGOGASTRODUODENOSCOPY N/A 12/23/2020   Procedure: ESOPHAGOGASTRODUODENOSCOPY (EGD);  Surgeon: Arta Silence, MD;  [facility] END[facility]COPY;  Service: Endoscopy;  Laterality: N/A;  . INSERTION PROSTATE RADIATION SEED  2001  . LEFT HEART CATHETERIZATION WITH CORONARY ANGIOGRAM N/A 11/26/2011   Procedure: LEFT HEART CATHETERIZATION WITH CORONARY ANGIOGRAM;  Surgeon: Sanda Klein, MD;  Location: [facility] CATH LAB;  Service: Cardiovascular;  Laterality: N/A;  . STAPEDECTOMY  1960; 1980   left; right       Family History  Problem Relation Age of Onset  . Hypertension Father     Social History   Tobacco Use  . Smoking status: Former Smoker    Packs/day: 0.50    Years: 14.00    Pack years: 7.00    Types: Cigars, Cigarettes  Quit date: 09/17/1978    Years since quitting: 42.4  . Smokeless tobacco: Never Used  Vaping Use  . Vaping Use: Never used  Substance Use Topics  . Alcohol use: Yes    Comment: 11/23/11 "haven't drank anything  since ~ 2008"  . Drug use: No    Home Medications Prior to Admission medications   Medication Sig Start Date End Date Taking? Authorizing Provider  acetaminophen (TYLENOL) 325 MG tablet Take 650 mg by mouth every 8 (eight) hours as needed for mild pain.   Yes [provider]  allopurinol (ZYLOPRIM) 100 MG tablet Take 1 tablet (100 mg total) by mouth daily. 01/04/21  Yes Masoud, Jarrett Soho, MD  amLODipine (NORVASC) 5 MG tablet Take 5 mg by mouth daily.   Yes [provider]  apixaban (ELIQUIS) 2.5 MG TABS tablet Take 1 tablet (2.5 mg total) by mouth 2 (two) times daily. 01/04/21  Yes George Hugh, MD  aspirin EC 81 MG tablet Take 81 mg by mouth daily.   Yes [provider]  bimatoprost (LUMIGAN) 0.03 % ophthalmic solution Place 1 drop into both eyes every evening. 12/07/20  Yes [provider]  carvedilol (COREG) 25 MG tablet Take 25 mg by mouth 2 (two) times daily with a meal.   Yes [provider]  Dextran 70-Hypromellose, PF, 0.1-0.3 % SOLN Place 1 drop into both eyes 3 (three) times daily. Natural Tears PF   Yes [provider]  docusate sodium (COLACE) 100 MG capsule Take 100 mg by mouth daily as needed for mild constipation.   Yes [provider]  ferrous gluconate (FERGON) 324 MG tablet Take 324 mg by mouth 2 (two) times daily.   Yes [provider]  furosemide (LASIX) 20 MG tablet Take 1 tablet (20 mg total) by mouth daily for 3 days. Then return back to the dosing to be taken as needed for weight gain greater than 2 lbs in 24 hours Patient taking differently: Take 20 mg by mouth daily as needed for fluid (for weight gain more than 2 lbs in 24 Hrs). 02/11/21 02/14/21 Yes Dorie Rank, MD  Lidocaine 4 % PTCH Apply 1 patch topically See admin instructions. Apply one patch to bilateral upper arms every morning,  remove at bedtime   Yes [provider]  Menthol, Topical Analgesic, (BIOFREEZE) 4 % GEL Apply 1 application topically 2 (two) times daily. Apply to back of neck,shoulders and upper arms.   Yes [provider]  [facility] powder Apply 1 application topically in the morning, at noon, in the evening, and at bedtime. Apply to groin, scrotal and thigh area 02/14/21  Yes [provider]  Omega-3 Fatty Acids (FISH OIL) 1000 MG CAPS Take 1,000 mg by mouth daily.   Yes [provider]  pantoprazole (PROTONIX) 40 MG tablet Take 1 tablet (40 mg total) by mouth 2 (two) times  daily before a meal. Patient taking differently: Take 40 mg by mouth daily. 01/04/21  Yes Masoud, Jarrett Soho, MD  Polyethyl Glyc-Propyl Glyc PF (SYSTANE ULTRA PF) 0.4-0.3 % SOLN Place 1 drop into both eyes daily.   Yes [provider]  rosuvastatin (CRESTOR) 5 MG tablet Take 5 mg by mouth See admin instructions. Take one tablet (5 mg) by mouth on Monday, Wednesday, Friday at bedtime   Yes [provider]  vitamin B-12 (CYANOCOBALAMIN) 1000 MCG tablet Take 1,000 mcg by mouth daily.   Yes [provider]  lidocaine (LIDODERM) 5 % Place 1 patch onto  the skin daily. Remove & Discard patch within 12 hours or as directed by MD Patient not taking: No sig reported 01/04/21   George Hugh, MD    Allergies    Hydrochlorothiazide and Morphine and related  Review of Systems   Review of Systems  Unable to perform ROS: Mental status change    Physical Exam Updated Vital Signs BP 119/70   Pulse 64   Temp (!) 95.8 F (35.4 C)   Resp (!) 22   SpO2 97%   Physical Exam Vitals and nursing note reviewed.  Constitutional:      Appearance: He is ill-appearing.  HENT:     Head: Normocephalic and atraumatic.     Right Ear: External ear normal.     Left Ear: External ear normal.     Nose: Nose normal.     Mouth/Throat:     Mouth: Mucous membranes are dry.  Eyes:     Extraocular Movements: Extraocular movements intact.     Conjunctiva/sclera: Conjunctivae normal.     Pupils: Pupils are equal, round, and reactive to light.  Cardiovascular:     Rate and Rhythm: Normal rate and regular rhythm.     Pulses: Normal pulses.     Heart sounds: Normal heart sounds.  Pulmonary:     Effort: Pulmonary effort is normal.     Breath sounds: Normal breath sounds.  Abdominal:     General: Abdomen is flat. Bowel sounds are normal.     Palpations: Abdomen is soft.  Musculoskeletal:     Cervical back: Normal range of motion and neck supple.  Neurological:     Mental Status: He is  disoriented and confused.     Comments: Pt is diffusely weak.  He is awake, but not talking much.  Psychiatric:     Comments: Unable to assess due to MS     ED Results / Procedures / Treatments   Labs (all labs ordered are listed, but only abnormal results are displayed) Labs Reviewed  CBC WITH DIFFERENTIAL/PLATELET - Abnormal; Notable for the following components:      Result Value   RBC 2.93 (*)    Hemoglobin 8.9 (*)    HCT 28.6 (*)    RDW 17.6 (*)    Platelets 65 (*)    All other components within normal limits  COMPREHENSIVE METABOLIC PANEL - Abnormal; Notable for the following components:   Sodium 147 (*)    Chloride 116 (*)    Glucose, Bld 157 (*)    BUN 73 (*)    Creatinine, Ser 3.82 (*)    Total Protein 6.1 (*)    Albumin 3.4 (*)    GFR, Estimated 15 (*)    All other components within normal limits  URINALYSIS, ROUTINE W REFLEX MICROSCOPIC - Abnormal; Notable for the following components:   Protein, ur 30 (*)    Bacteria, UA RARE (*)    All other components within normal limits  PROTIME-INR - Abnormal; Notable for the following components:   Prothrombin Time 17.7 (*)    INR 1.5 (*)    All other components within normal limits  CBG MONITORING, ED - Abnormal; Notable for the following components:   Glucose-Capillary 158 (*)    All other components within normal limits  RESP PANEL BY RT-PCR (FLU A&B, COVID) [id number]  URINE CULTURE  CULTURE, BLOOD (ROUTINE X 2)  CULTURE, BLOOD (ROUTINE X 2)  LACTIC ACID, PLASMA  BLOOD GAS, ARTERIAL    EKG None  Afib HR 65.  No st or t wave changes.  No significant change.  Radiology CT Head Wo Contrast  Result Date: 02/20/2021 CLINICAL DATA:  Thrombocytopenia, altered mental status EXAM: CT HEAD WITHOUT CONTRAST TECHNIQUE: Contiguous axial images were obtained from the base of the skull through the vertex without intravenous contrast. COMPARISON:  02/11/2021 FINDINGS: Brain: Normal anatomic configuration. Parenchymal volume  loss is commensurate with the patient's age. Mild periventricular white matter changes are present likely reflecting the sequela of small vessel ischemia. No abnormal intra or extra-axial mass lesion or fluid collection. No abnormal mass effect or midline shift. No evidence of acute intracranial hemorrhage or infarct. Ventricular size is normal. Cerebellum unremarkable. Vascular: No asymmetric hyperdense vasculature at the skull base. Skull: Intact Sinuses/Orbits: Paranasal sinuses are clear. Orbits are unremarkable. Other: Mastoid air cells and middle ear cavities are clear. IMPRESSION: No acute intracranial hemorrhage or infarct. Stable senescent change. Electronically Signed   By: Fidela Salisbury MD   On: 02/20/2021 23:41   DG Chest Portable 1 View  Result Date: 02/20/2021 CLINICAL DATA:  Altered mental status, hypotension EXAM: PORTABLE CHEST 1 VIEW COMPARISON:  02/11/2021 FINDINGS: Cardiomegaly. Atherosclerotic calcification of the aortic knob. Low lung volumes. Pulmonary vascular congestion with bilateral perihilar interstitial opacities. No large pleural fluid collection. No pneumothorax is seen. IMPRESSION: Findings suggest CHF with pulmonary edema. A superimposed infectious process would be difficult to exclude. Electronically Signed   By: Davina Poke D.O.   On: 02/20/2021 20:34    Procedures Procedures   Medications Ordered in ED Medications  sodium chloride 0.9 % bolus 1,000 mL (0 mLs Intravenous Stopped 02/20/21 2059)  0.9 %  sodium chloride infusion ( Intravenous New Bag/Given 02/20/21 2118)    ED Course  I have reviewed the triage vital signs and the nursing notes.  Pertinent labs & imaging results that were available during my care of the patient were reviewed by me and considered in my medical decision making (see chart for details).    MDM Rules/Calculators/A&P                          Pt's temp is low and he is placed on a bair hugger and given warmed fluids.    He is  gradually becoming more alert.  Labs reviewed.  He has some mild AKI.  He has hypernatremia.  Both are likely due to dehydration. He has chronic anemia, but thrombocytopenia.  Lactic nl.    Pt d/w Dr. Flossie Buffy (triad) for admission.  CRITICAL CARE Performed by: Isla Pence   Total critical care time: 45 minutes  Critical care time was exclusive of separately billable procedures and treating other patients.  Critical care was necessary to treat or prevent imminent or life-threatening deterioration.  Critical care was time spent personally by me on the following activities: development of treatment plan with patient and/or surrogate as well as nursing, discussions with consultants, evaluation of patient's response to treatment, examination of patient, obtaining history from patient or surrogate, ordering and performing treatments and interventions, ordering and review of laboratory studies, ordering and review of radiographic studies, pulse oximetry and re-evaluation of patient's condition.  Final Clinical Impression(s) / ED Diagnoses Final diagnoses:  Hypothermia, initial encounter  Metabolic encephalopathy  Dehydration  Hypernatremia  Thrombocytopenia (HCC)  Chronic anemia  Failure to thrive in adult    Rx / DC Orders ED Discharge Orders    None       Isla Pence,  MD 02/21/21 0022

## 2021-02-21 ENCOUNTER — Observation Stay (HOSPITAL_COMMUNITY): Payer: Medicare Other

## 2021-02-21 ENCOUNTER — Inpatient Hospital Stay (HOSPITAL_COMMUNITY): Payer: Medicare Other

## 2021-02-21 DIAGNOSIS — M7989 Other specified soft tissue disorders: Secondary | ICD-10-CM

## 2021-02-21 DIAGNOSIS — R627 Adult failure to thrive: Secondary | ICD-10-CM | POA: Diagnosis present

## 2021-02-21 DIAGNOSIS — M4802 Spinal stenosis, cervical region: Secondary | ICD-10-CM

## 2021-02-21 DIAGNOSIS — F0390 Unspecified dementia without behavioral disturbance: Secondary | ICD-10-CM | POA: Diagnosis present

## 2021-02-21 DIAGNOSIS — Z95820 Peripheral vascular angioplasty status with implants and grafts: Secondary | ICD-10-CM

## 2021-02-21 DIAGNOSIS — I469 Cardiac arrest, cause unspecified: Secondary | ICD-10-CM | POA: Diagnosis not present

## 2021-02-21 DIAGNOSIS — R4182 Altered mental status, unspecified: Secondary | ICD-10-CM | POA: Diagnosis present

## 2021-02-21 DIAGNOSIS — Z7189 Other specified counseling: Secondary | ICD-10-CM | POA: Diagnosis not present

## 2021-02-21 DIAGNOSIS — E860 Dehydration: Secondary | ICD-10-CM | POA: Diagnosis present

## 2021-02-21 DIAGNOSIS — N1832 Chronic kidney disease, stage 3b: Secondary | ICD-10-CM | POA: Diagnosis present

## 2021-02-21 DIAGNOSIS — D649 Anemia, unspecified: Secondary | ICD-10-CM

## 2021-02-21 DIAGNOSIS — R092 Respiratory arrest: Secondary | ICD-10-CM | POA: Diagnosis not present

## 2021-02-21 DIAGNOSIS — D631 Anemia in chronic kidney disease: Secondary | ICD-10-CM | POA: Diagnosis present

## 2021-02-21 DIAGNOSIS — Z66 Do not resuscitate: Secondary | ICD-10-CM | POA: Diagnosis present

## 2021-02-21 DIAGNOSIS — T68XXXA Hypothermia, initial encounter: Secondary | ICD-10-CM

## 2021-02-21 DIAGNOSIS — R400 Somnolence: Secondary | ICD-10-CM

## 2021-02-21 DIAGNOSIS — I2510 Atherosclerotic heart disease of native coronary artery without angina pectoris: Secondary | ICD-10-CM | POA: Diagnosis present

## 2021-02-21 DIAGNOSIS — R296 Repeated falls: Secondary | ICD-10-CM | POA: Diagnosis present

## 2021-02-21 DIAGNOSIS — I5022 Chronic systolic (congestive) heart failure: Secondary | ICD-10-CM | POA: Diagnosis present

## 2021-02-21 DIAGNOSIS — A419 Sepsis, unspecified organism: Secondary | ICD-10-CM | POA: Diagnosis present

## 2021-02-21 DIAGNOSIS — Z955 Presence of coronary angioplasty implant and graft: Secondary | ICD-10-CM | POA: Diagnosis not present

## 2021-02-21 DIAGNOSIS — R680 Hypothermia, not associated with low environmental temperature: Secondary | ICD-10-CM | POA: Diagnosis present

## 2021-02-21 DIAGNOSIS — Z515 Encounter for palliative care: Secondary | ICD-10-CM | POA: Diagnosis not present

## 2021-02-21 DIAGNOSIS — I4821 Permanent atrial fibrillation: Secondary | ICD-10-CM | POA: Diagnosis present

## 2021-02-21 DIAGNOSIS — D696 Thrombocytopenia, unspecified: Secondary | ICD-10-CM | POA: Diagnosis present

## 2021-02-21 DIAGNOSIS — I130 Hypertensive heart and chronic kidney disease with heart failure and stage 1 through stage 4 chronic kidney disease, or unspecified chronic kidney disease: Secondary | ICD-10-CM | POA: Diagnosis present

## 2021-02-21 DIAGNOSIS — I4891 Unspecified atrial fibrillation: Secondary | ICD-10-CM | POA: Diagnosis not present

## 2021-02-21 DIAGNOSIS — N179 Acute kidney failure, unspecified: Secondary | ICD-10-CM | POA: Diagnosis present

## 2021-02-21 DIAGNOSIS — E870 Hyperosmolality and hypernatremia: Secondary | ICD-10-CM | POA: Diagnosis present

## 2021-02-21 DIAGNOSIS — J9601 Acute respiratory failure with hypoxia: Secondary | ICD-10-CM | POA: Diagnosis present

## 2021-02-21 DIAGNOSIS — G9341 Metabolic encephalopathy: Secondary | ICD-10-CM | POA: Diagnosis present

## 2021-02-21 DIAGNOSIS — R339 Retention of urine, unspecified: Secondary | ICD-10-CM | POA: Diagnosis present

## 2021-02-21 DIAGNOSIS — Z20822 Contact with and (suspected) exposure to covid-19: Secondary | ICD-10-CM | POA: Diagnosis present

## 2021-02-21 LAB — GLUCOSE, CAPILLARY
Glucose-Capillary: 87 mg/dL (ref 70–99)
Glucose-Capillary: 88 mg/dL (ref 70–99)
Glucose-Capillary: 102 mg/dL — ABNORMAL HIGH (ref 70–99)

## 2021-02-21 LAB — TSH: TSH: 6.355 u[IU]/mL — ABNORMAL HIGH (ref 0.350–4.500)

## 2021-02-21 LAB — CBC
HCT: 27.9 % — ABNORMAL LOW (ref 39.0–52.0)
HCT: 29.2 % — ABNORMAL LOW (ref 39.0–52.0)
Hemoglobin: 8.6 g/dL — ABNORMAL LOW (ref 13.0–17.0)
Hemoglobin: 8.9 g/dL — ABNORMAL LOW (ref 13.0–17.0)
MCH: 30.2 pg (ref 26.0–34.0)
MCH: 30.4 pg (ref 26.0–34.0)
MCHC: 30.8 g/dL (ref 30.0–36.0)
MCHC: 30.5 g/dL (ref 30.0–36.0)
MCV: 97.9 fL (ref 80.0–100.0)
MCV: 99.7 fL (ref 80.0–100.0)
Platelets: 58 10*3/uL — ABNORMAL LOW (ref 150–400)
Platelets: 58 10*3/uL — ABNORMAL LOW (ref 150–400)
RBC: 2.85 MIL/uL — ABNORMAL LOW (ref 4.22–5.81)
RBC: 2.93 MIL/uL — ABNORMAL LOW (ref 4.22–5.81)
RDW: 17.7 % — ABNORMAL HIGH (ref 11.5–15.5)
RDW: 18.1 % — ABNORMAL HIGH (ref 11.5–15.5)
WBC: 6 10*3/uL (ref 4.0–10.5)
WBC: 6.3 10*3/uL (ref 4.0–10.5)
nRBC: 0 % (ref 0.0–0.2)
nRBC: 0 % (ref 0.0–0.2)

## 2021-02-21 LAB — BLOOD CULTURE ID PANEL (REFLEXED) - BCID2

## 2021-02-21 LAB — LACTIC ACID, PLASMA
Lactic Acid, Venous: 1.2 mmol/L (ref 0.5–1.9)
Lactic Acid, Venous: 0.8 mmol/L (ref 0.5–1.9)

## 2021-02-21 LAB — COMPREHENSIVE METABOLIC PANEL
ALT: 17 U/L (ref 0–44)
AST: 13 U/L — ABNORMAL LOW (ref 15–41)
Albumin: 3.2 g/dL — ABNORMAL LOW (ref 3.5–5.0)
Alkaline Phosphatase: 83 U/L (ref 38–126)
Anion gap: 6 (ref 5–15)
BUN: 73 mg/dL — ABNORMAL HIGH (ref 8–23)
CO2: 24 mmol/L (ref 22–32)
Calcium: 9.8 mg/dL (ref 8.9–10.3)
Chloride: 121 mmol/L — ABNORMAL HIGH (ref 98–111)
Creatinine, Ser: 4.31 mg/dL — ABNORMAL HIGH (ref 0.61–1.24)
GFR, Estimated: 13 mL/min — ABNORMAL LOW (ref >60)
Glucose, Bld: 93 mg/dL (ref 70–99)
Potassium: 4.1 mmol/L (ref 3.5–5.1)
Sodium: 151 mmol/L — ABNORMAL HIGH (ref 135–145)
Total Bilirubin: 0.8 mg/dL (ref 0.3–1.2)
Total Protein: 6 g/dL — ABNORMAL LOW (ref 6.5–8.1)

## 2021-02-21 LAB — BLOOD GAS, ARTERIAL
Acid-base deficit: 0.7 mmol/L (ref 0.0–2.0)
Bicarbonate: 23 mmol/L (ref 20.0–28.0)
O2 Saturation: 99.6 %
Patient temperature: 97.9
pCO2 arterial: 35.2 mmHg (ref 32.0–48.0)
pH, Arterial: 7.428 (ref 7.350–7.450)
pO2, Arterial: 190 mmHg — ABNORMAL HIGH (ref 83.0–108.0)

## 2021-02-21 LAB — POC OCCULT BLOOD, ED: Fecal Occult Bld: NEGATIVE

## 2021-02-21 LAB — BASIC METABOLIC PANEL
Anion gap: 12 (ref 5–15)
BUN: 74 mg/dL — ABNORMAL HIGH (ref 8–23)
CO2: 23 mmol/L (ref 22–32)
Calcium: 9.5 mg/dL (ref 8.9–10.3)
Chloride: 116 mmol/L — ABNORMAL HIGH (ref 98–111)
Creatinine, Ser: 3.85 mg/dL — ABNORMAL HIGH (ref 0.61–1.24)
GFR, Estimated: 15 mL/min — ABNORMAL LOW (ref >60)
Glucose, Bld: 66 mg/dL — ABNORMAL LOW (ref 70–99)
Potassium: 4.7 mmol/L (ref 3.5–5.1)
Sodium: 151 mmol/L — ABNORMAL HIGH (ref 135–145)

## 2021-02-21 LAB — LACTATE DEHYDROGENASE: LDH: 132 U/L (ref 98–192)

## 2021-02-21 LAB — AMMONIA: Ammonia: 13 umol/L (ref 9–35)

## 2021-02-21 LAB — HEPATITIS C ANTIBODY: HCV Ab: NONREACTIVE

## 2021-02-21 LAB — PROTIME-INR
INR: 1.5 — ABNORMAL HIGH (ref 0.8–1.2)
Prothrombin Time: 18 seconds — ABNORMAL HIGH (ref 11.4–15.2)

## 2021-02-21 LAB — MRSA PCR SCREENING: MRSA by PCR: NEGATIVE

## 2021-02-21 LAB — SARS CORONAVIRUS 2 (TAT 6-24 HRS): SARS Coronavirus 2: NEGATIVE

## 2021-02-21 LAB — APTT: aPTT: 51 seconds — ABNORMAL HIGH (ref 24–36)

## 2021-02-21 IMAGING — DX DG ABDOMEN 1V
2 series · 2 of 2 positions shown · non-contrast
Comparison: None.

CLINICAL DATA: Constipation

EXAM:
ABDOMEN - 1 VIEW

[abdomen kub (1 of 2)]
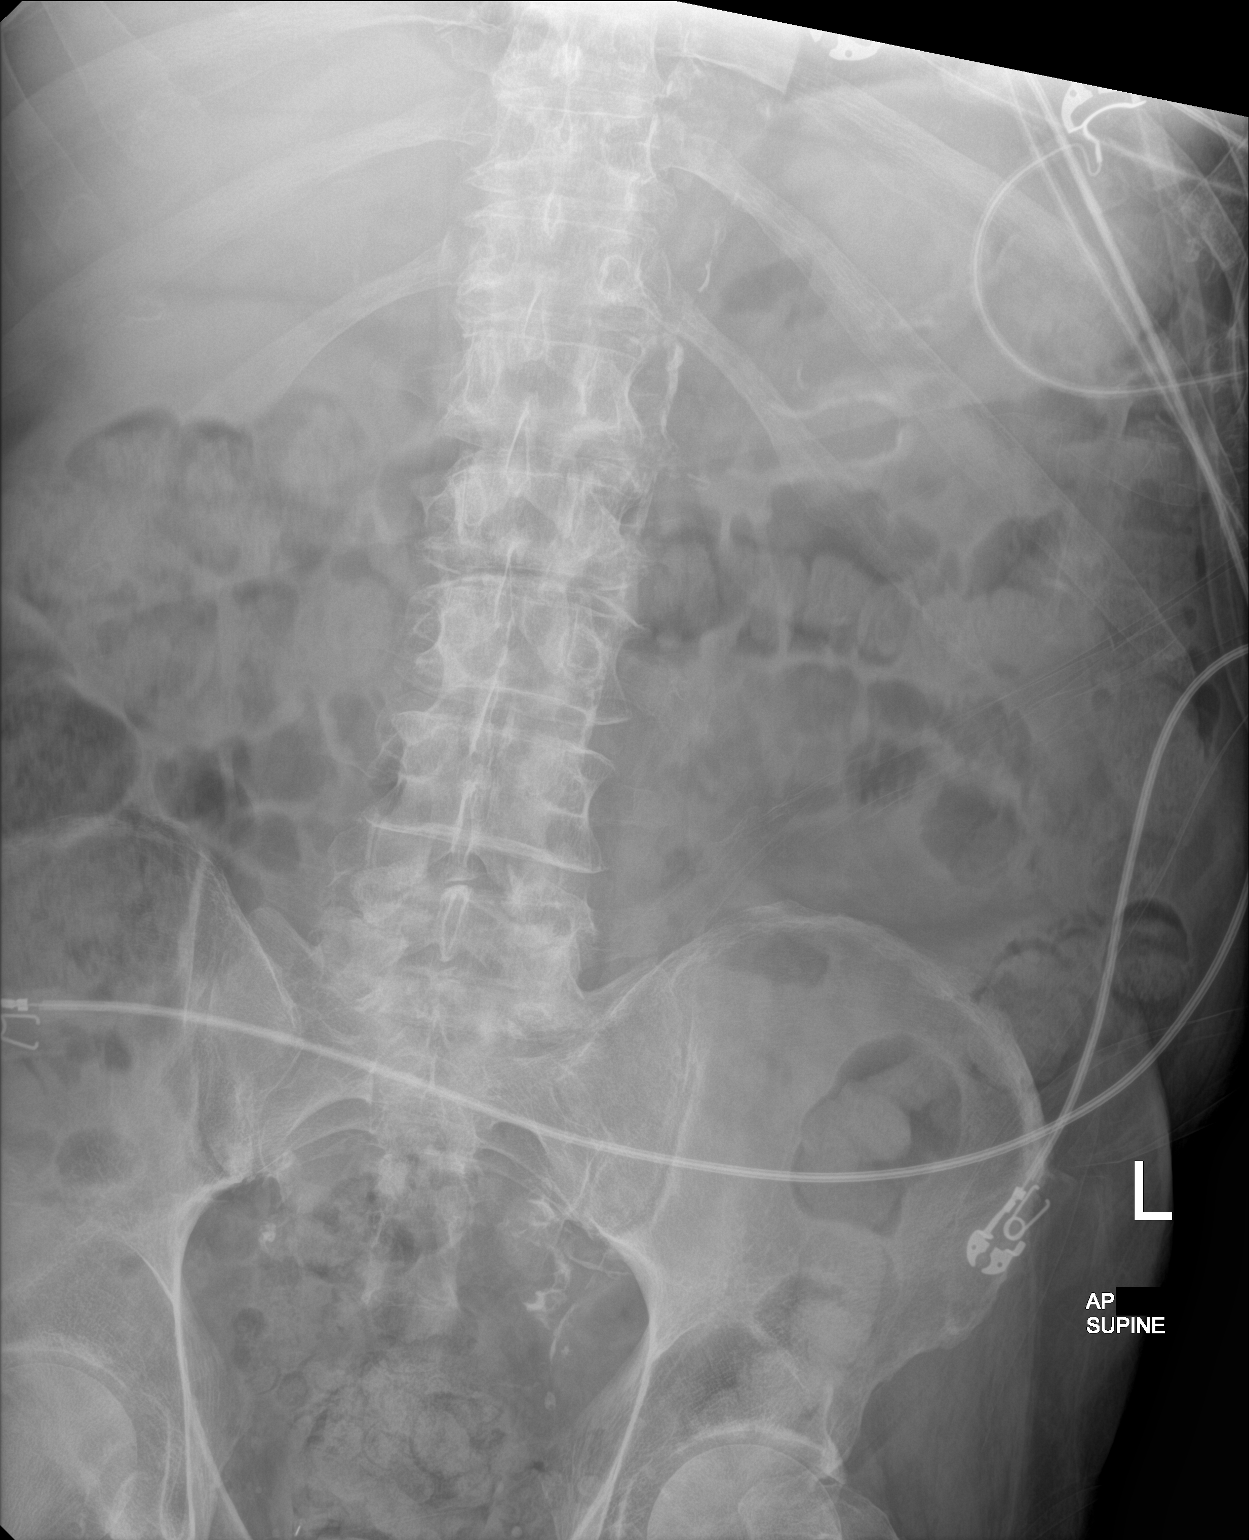

[abdomen kub (2 of 2)]
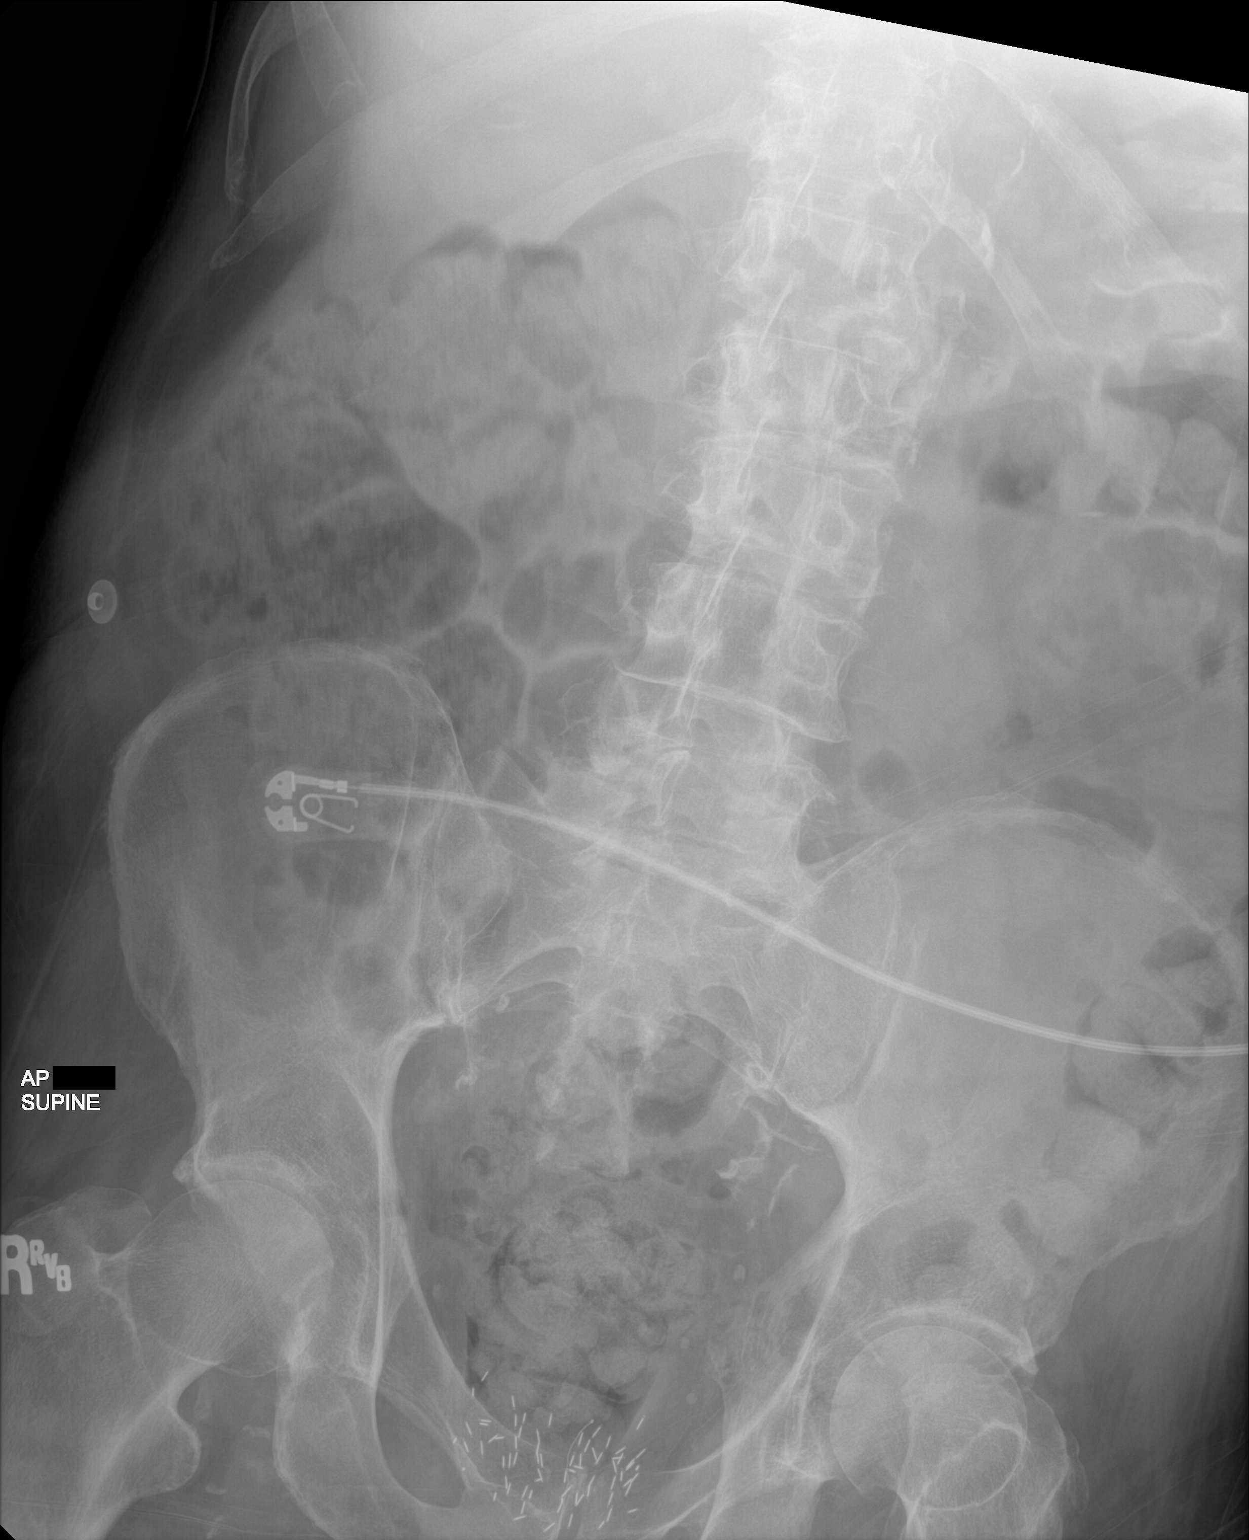

[2 of 2 positions shown; findings below may reference images not displayed]

FINDINGS: Normal abdominal gas pattern. Moderate stool seen throughout the
colon. No gross free intraperitoneal gas. Vascular calcifications
are seen within the abdominal aorta and pelvis. No organomegaly.
Brachytherapy seeds overlie the expected prostate gland.
IMPRESSION: Nonobstructive bowel gas pattern.  Moderate stool.

## 2021-02-21 IMAGING — MR MR HEAD W/O CM
13 series · 48 of 48 positions shown · non-contrast
Comparison: None.

CLINICAL DATA: Delirium

EXAM:
MRI HEAD WITHOUT CONTRAST
TECHNIQUE: Multiplanar, multiecho pulse sequences of the brain and surrounding
structures were obtained without intravenous contrast.

[Series 9: dwi_tracew · axial · 3.0mm · 1.08mm/px · z∈[-12,+138]mm · 7 of 102 slices shown]
[im 1/102]
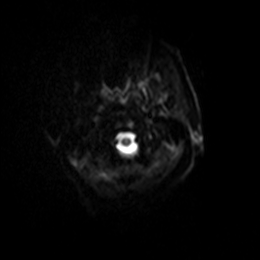
[im 17/102]
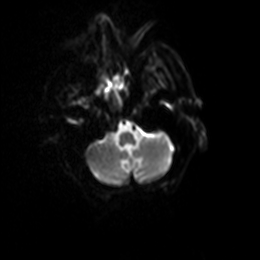
[im 34/102]
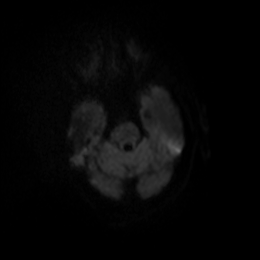
[im 51/102]
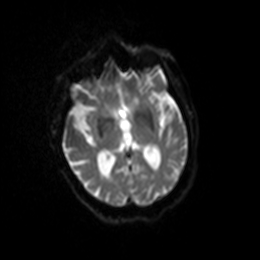
[im 68/102]
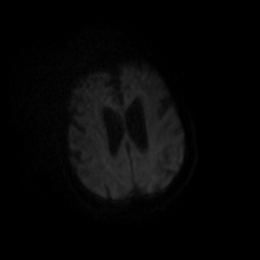
[im 85/102]
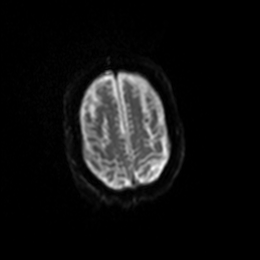
[im 102/102]
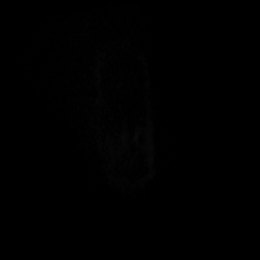

[Series 10: dwi_adc · axial · 3.0mm · 1.08mm/px · z∈[-12,+138]mm · 3 of 51 slices shown]
[im 1/51]
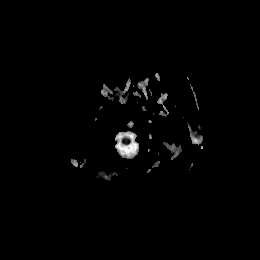
[im 26/51]
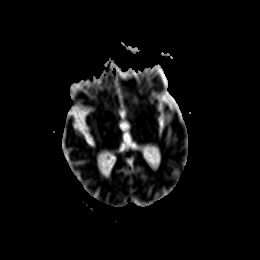
[im 51/51]
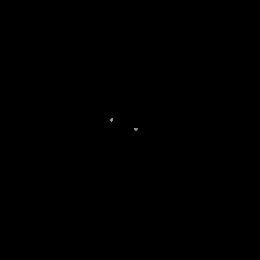

[Series 11: T2 · sagittal · 5.0mm · 0.47mm/px · 2 of 24 slices shown (1 of 3)]
[im 1/24]
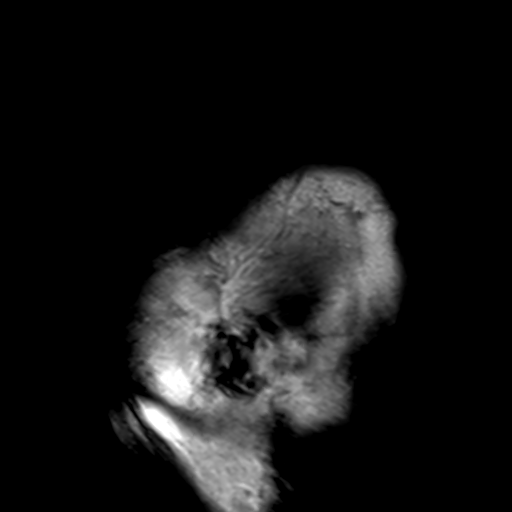
[im 24/24]
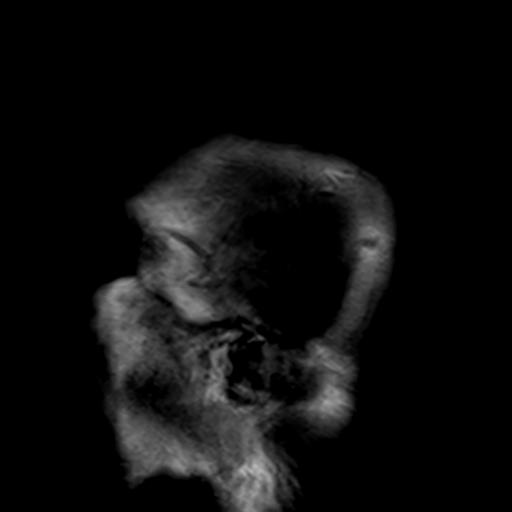

[Series 12: DWI · coronal · 5.0mm · 1.31mm/px · 4 of 48 slices shown (1 of 4)]
[im 1/48]
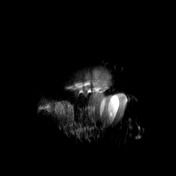
[im 16/48]
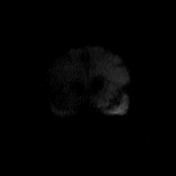
[im 32/48]
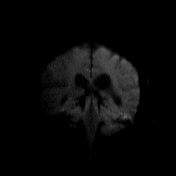
[im 48/48]
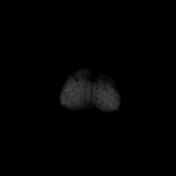

[Series 13: DWI · coronal · 5.0mm · 1.31mm/px · 2 of 24 slices shown (2 of 4)]
[im 1/24]
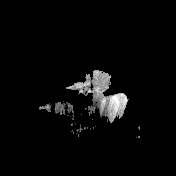
[im 24/24]
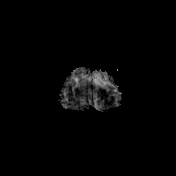

[Series 14: T2 · axial · 5.0mm · 0.47mm/px · z∈[-20,+141]mm · 2 of 26 slices shown (2 of 3)]
[im 1/26]
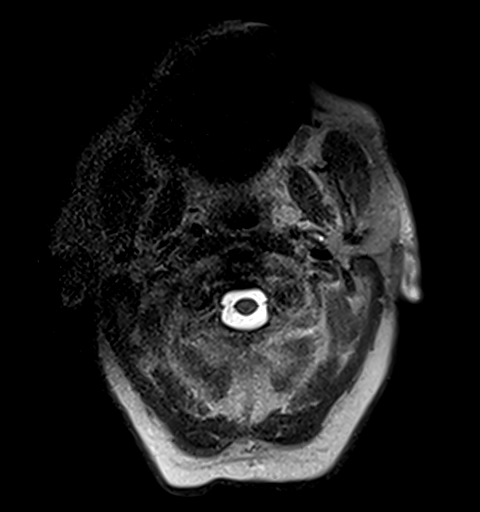
[im 26/26]
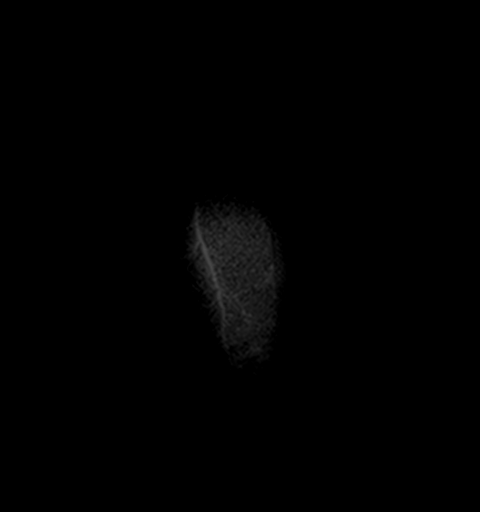

[Series 15: ep2d_tra_hemo · axial · 3.0mm · 1.88mm/px · z∈[-11,+138]mm · 4 of 51 slices shown]
[im 1/51]
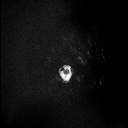
[im 17/51]
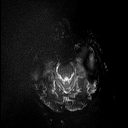
[im 34/51]
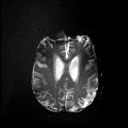
[im 51/51]
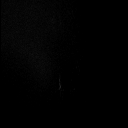

[Series 16: T1 · axial · 4.0mm · 0.47mm/px · z∈[-32,+123]mm · 3 of 40 slices shown]
[im 1/40]
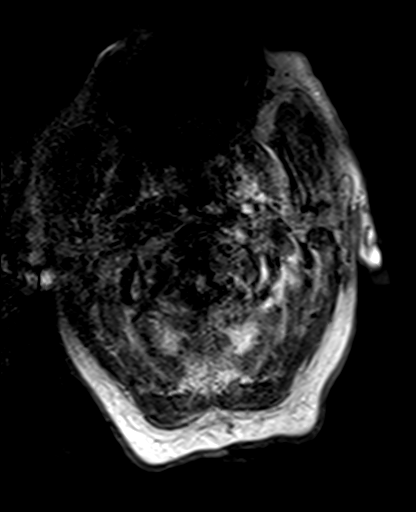
[im 20/40]
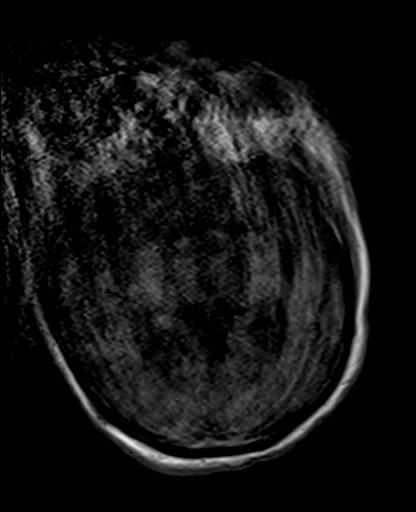
[im 40/40]
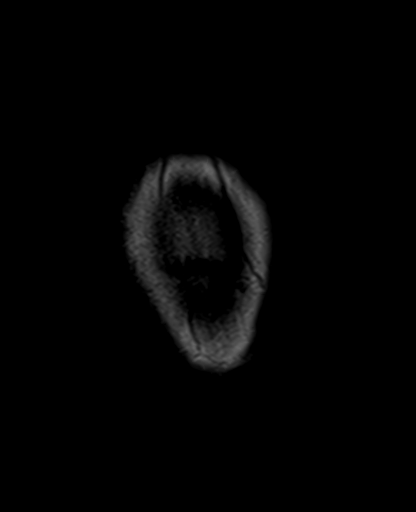

[Series 17: FLAIR · axial · 3.0mm · 0.94mm/px · z∈[-18,+131]mm · 4 of 51 slices shown (1 of 2)]
[im 1/51]
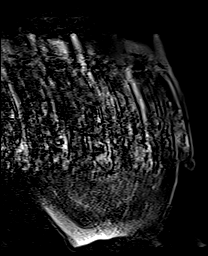
[im 17/51]
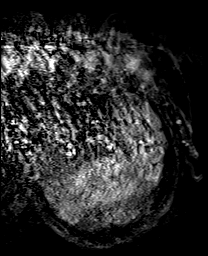
[im 34/51]
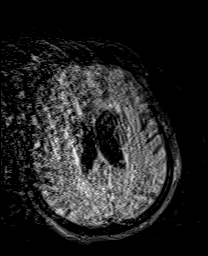
[im 51/51]
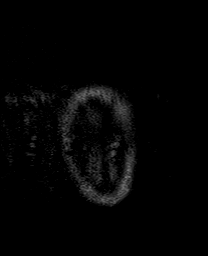

[Series 18: T2 · coronal · 5.0mm · 0.94mm/px · 2 of 24 slices shown (3 of 3)]
[im 1/24]
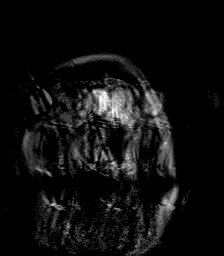
[im 24/24]
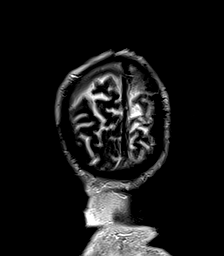

[Series 19: DWI · axial · 3.0mm · 1.36mm/px · z∈[-21,+119]mm · 7 of 96 slices shown (3 of 4)]
[im 1/96]
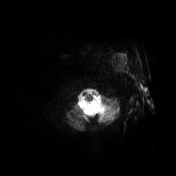
[im 16/96]
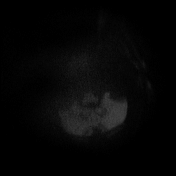
[im 32/96]
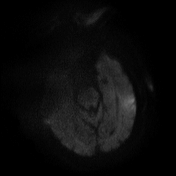
[im 48/96]
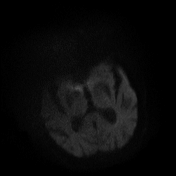
[im 64/96]
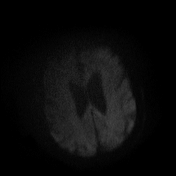
[im 80/96]
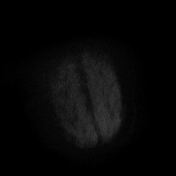
[im 96/96]
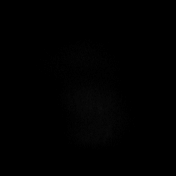

[Series 20: DWI · axial · 3.0mm · 1.36mm/px · z∈[-21,+119]mm · 4 of 48 slices shown (4 of 4)]
[im 1/48]
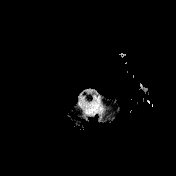
[im 16/48]
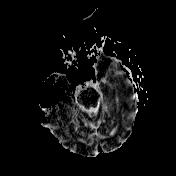
[im 32/48]
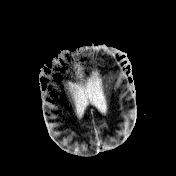
[im 48/48]
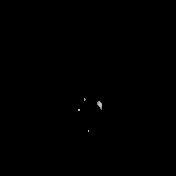

[Series 21: FLAIR · axial · 3.0mm · 0.94mm/px · z∈[-15,+134]mm · 4 of 51 slices shown (2 of 2)]
[im 1/51]
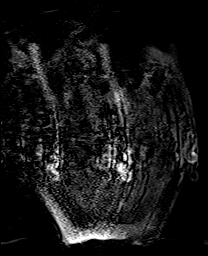
[im 17/51]
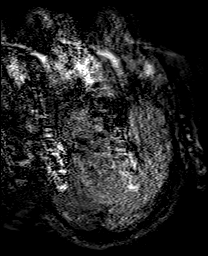
[im 34/51]
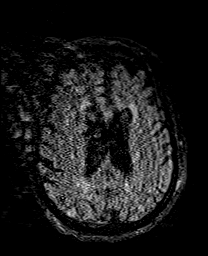
[im 51/51]
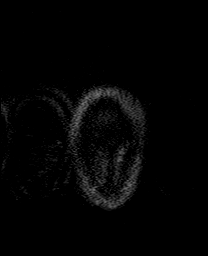

[48 of 48 positions shown; findings below may reference images not displayed]

FINDINGS: Significant motion artifact is present. Findings below are within
this limitation.

Brain: There is no acute infarction. There is no intracranial mass,
mass effect, or edema. There is no hydrocephalus or extra-axial
fluid collection. Patchy T2 hyperintensity in the supratentorial
white matter may reflect chronic microvascular ischemic changes.
Prominence of the ventricles and sulci reflects generalized
parenchymal volume loss.

Vascular: Major vessel flow voids at the skull base are preserved.

Skull and upper cervical spine: Normal marrow signal is preserved.

Sinuses/Orbits: Paranasal sinuses are aerated. Bilateral lens
replacements.

Other: Sella is unremarkable.  Mastoid air cells are clear.
IMPRESSION: Significantly motion degraded study.

No acute infarction.

## 2021-02-21 IMAGING — DX DG CHEST 1V PORT
2 series · 2 of 2 positions shown · non-contrast
Comparison: Radiograph yesterday.  Most recent CT [DATE]

CLINICAL DATA: Hypoxia.

EXAM:
PORTABLE CHEST 1 VIEW

[chest ap (1 of 2)]
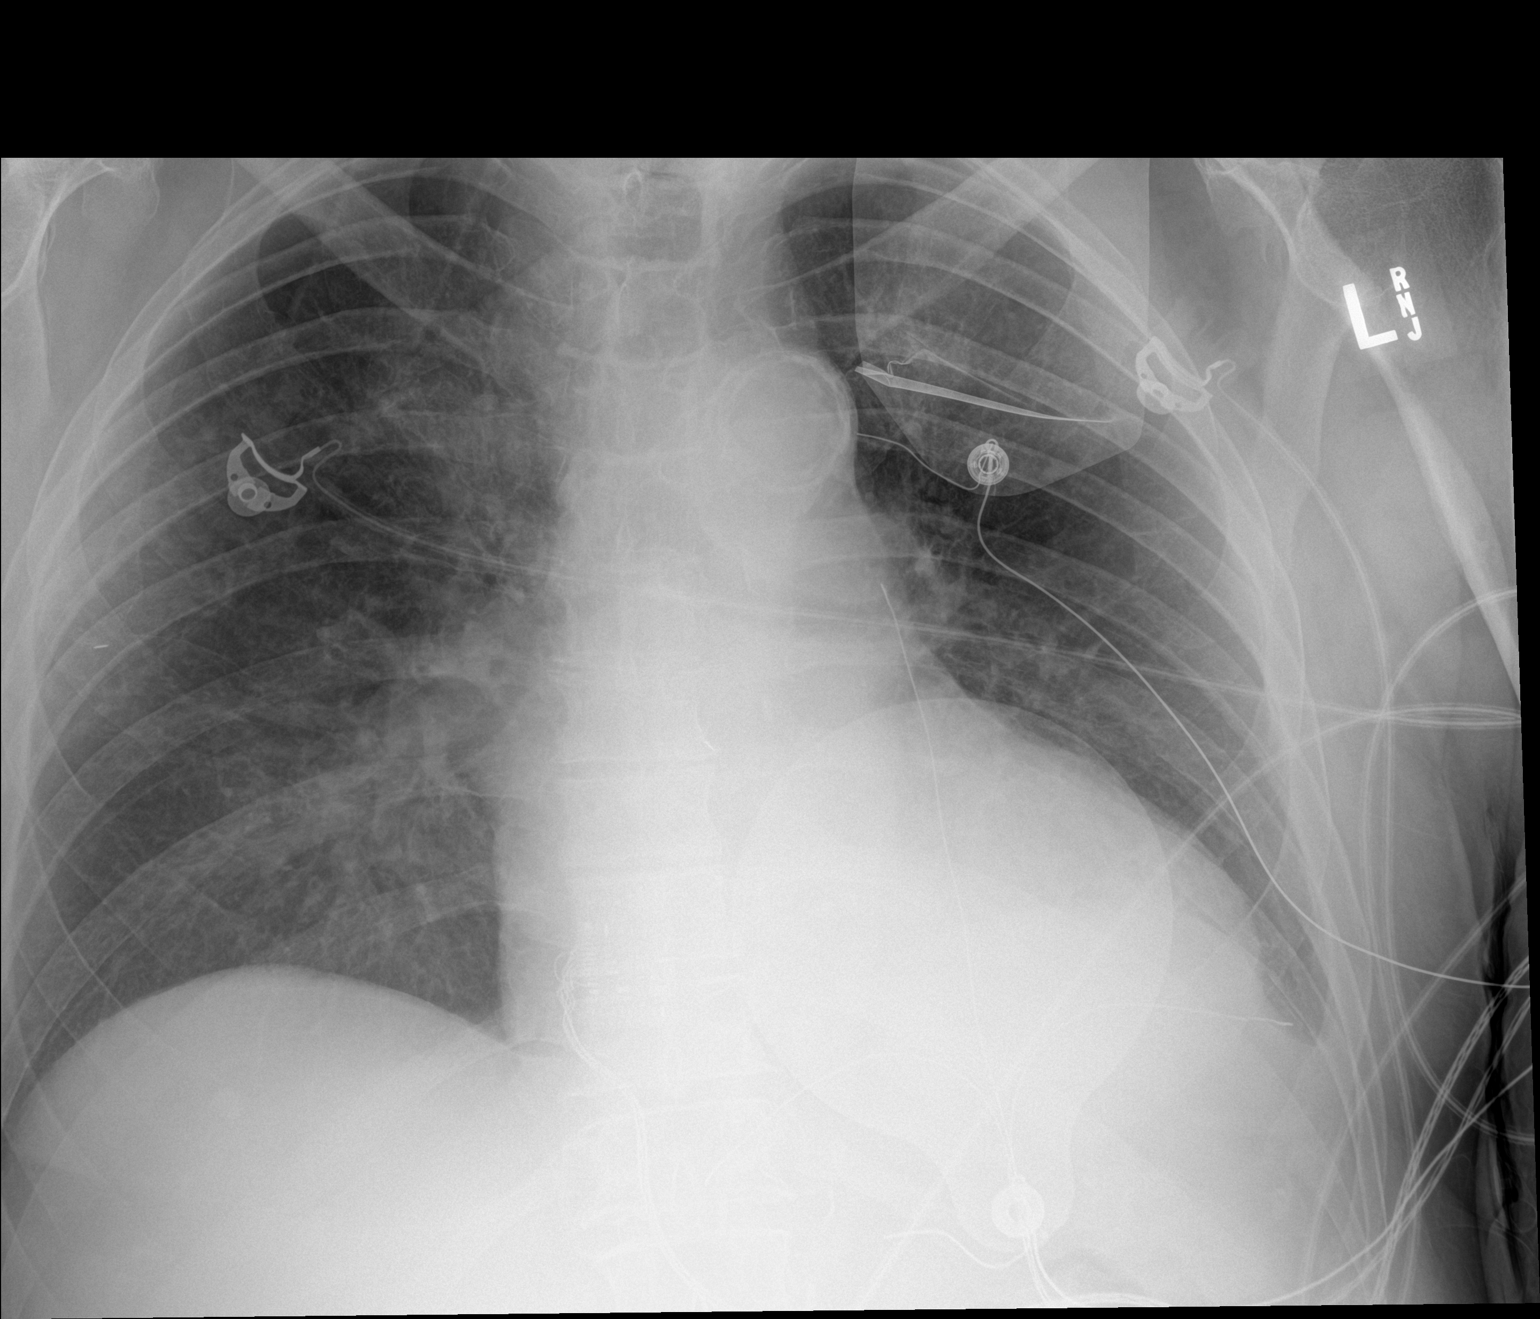

[chest ap (2 of 2)]
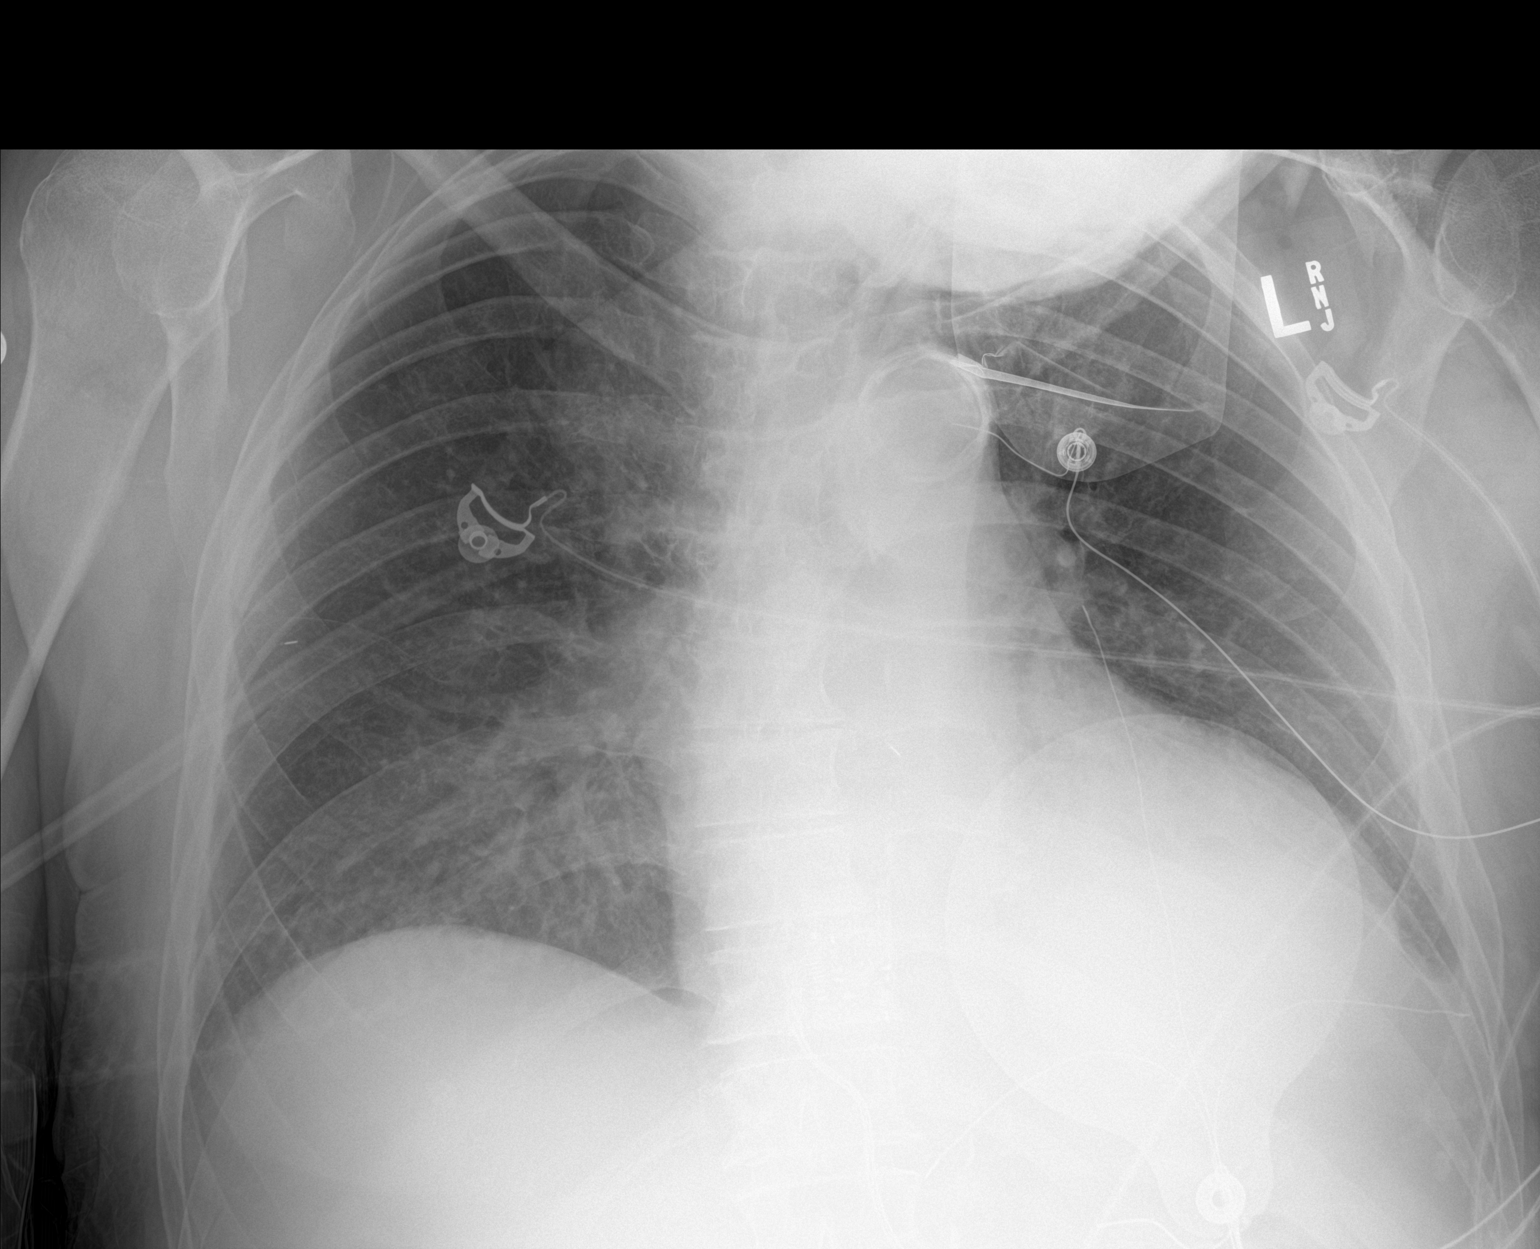

[2 of 2 positions shown; findings below may reference images not displayed]

FINDINGS: Similar low lung volumes to prior exam. Stable cardiomegaly.
Unchanged mediastinal contours with aortic atherosclerosis. Small
left pleural effusion with adjacent basilar airspace disease or
atelectasis. Small right pleural effusion. Improved pulmonary edema.
Persistent vascular congestion. No pneumothorax.
IMPRESSION: 1. Improving pulmonary edema. Persistent vascular congestion and
cardiomegaly.
2. Bilateral pleural effusions with adjacent basilar airspace
disease/atelectasis, left greater than right.

## 2021-02-21 MED ORDER — TAMSULOSIN HCL 0.4 MG PO CAPS
0.4000 mg | ORAL_CAPSULE | Freq: Every day | ORAL | Status: DC
  Administered 2021-02-22: 0.4 mg via ORAL
  Filled 2021-02-21: qty 1

## 2021-02-21 MED ORDER — CARVEDILOL 12.5 MG PO TABS
25.0000 mg | ORAL_TABLET | Freq: Two times a day (BID) | ORAL | Status: DC
  Administered 2021-02-21: 25 mg via ORAL
  Filled 2021-02-21: qty 2

## 2021-02-21 MED ORDER — CHLORHEXIDINE GLUCONATE CLOTH 2 % EX PADS
6.0000 | MEDICATED_PAD | Freq: Every day | CUTANEOUS | Status: DC
  Administered 2021-02-21 – 2021-02-22 (×2): 6 via TOPICAL

## 2021-02-21 MED ORDER — AMLODIPINE BESYLATE 5 MG PO TABS
5.0000 mg | ORAL_TABLET | Freq: Every day | ORAL | Status: DC
  Administered 2021-02-21: 5 mg via ORAL
  Filled 2021-02-21: qty 1

## 2021-02-21 MED ORDER — SODIUM CHLORIDE 0.9 % IV SOLN
2.0000 g | INTRAVENOUS | Status: AC
  Administered 2021-02-21: 2 g via INTRAVENOUS
  Filled 2021-02-21: qty 2

## 2021-02-21 MED ORDER — SODIUM CHLORIDE 0.9 % IV SOLN: INTRAVENOUS | Status: DC

## 2021-02-21 MED ORDER — FREE WATER: 150.0000 mL | Freq: Three times a day (TID) | Status: DC

## 2021-02-21 MED ORDER — VANCOMYCIN VARIABLE DOSE PER UNSTABLE RENAL FUNCTION (PHARMACIST DOSING): Status: DC

## 2021-02-21 MED ORDER — DEXTROSE 5 % IV SOLN: INTRAVENOUS | Status: AC

## 2021-02-21 MED ORDER — ONDANSETRON HCL 4 MG/2ML IJ SOLN: 4.0000 mg | Freq: Four times a day (QID) | INTRAMUSCULAR | Status: DC | PRN

## 2021-02-21 MED ORDER — VANCOMYCIN HCL 1750 MG/350ML IV SOLN
1750.0000 mg | INTRAVENOUS | Status: AC
  Administered 2021-02-21: 1750 mg via INTRAVENOUS
  Filled 2021-02-21: qty 350

## 2021-02-21 MED ORDER — ROSUVASTATIN CALCIUM 10 MG PO TABS: 5.0000 mg | ORAL_TABLET | ORAL | Status: DC

## 2021-02-21 MED ORDER — SODIUM CHLORIDE 0.9 % IV SOLN: 2.0000 g | INTRAVENOUS | Status: DC

## 2021-02-21 MED ORDER — PANTOPRAZOLE SODIUM 40 MG IV SOLR
40.0000 mg | INTRAVENOUS | Status: DC
  Administered 2021-02-21: 40 mg via INTRAVENOUS
  Filled 2021-02-21: qty 40

## 2021-02-21 MED ORDER — CHLORHEXIDINE GLUCONATE 0.12 % MT SOLN
15.0000 mL | Freq: Two times a day (BID) | OROMUCOSAL | Status: DC
  Administered 2021-02-22 (×2): 15 mL via OROMUCOSAL
  Filled 2021-02-21 (×2): qty 15

## 2021-02-21 MED ORDER — ORAL CARE MOUTH RINSE
15.0000 mL | Freq: Two times a day (BID) | OROMUCOSAL | Status: DC
  Administered 2021-02-22: 15 mL via OROMUCOSAL

## 2021-02-21 NOTE — Consult Note (Addendum)
NAME:  [name] [name], MRN:  [id number], DOB:  [birth year], LOS: 0 ADMISSION DATE:  02/20/2021, CONSULTATION DATE:  02/21/21 REFERRING MD:  Tawanna Solo, CHIEF COMPLAINT:   Respiratory arrest  History of Present Illness:  87 y/o M with PMH of HTN, CAD s/p stenting, atrial fibrillation on Eliquis and CKD stage IIIb with baseline creatinine of 4, HFrEF who was recently admitted (12/2020) for fall and discharged to rehab.  He was noted to have worsening upper extremity weakness and and had cervical MRI showing foraminal narrowing at multiple levels and was scheduled for cervical decompression on 6/9.  He had labs checked for pre-op labs and platelets came back in the 50k's, so was sent to the ED.   Once he presented there he was found to be hypothermic and increasingly altered, so was admitted to the family medicine service.   He was noted to have some dark stools, though is on iron supplementation, Hgb 8.6.  On 6/7 he underwent MRI brain and had a brief respiratory arrest with bradycardia. He never lost pulses, as pt is DNI, he was placed on Bipap and transferred to ICU.  Pt's son provides supplemental history, states that early on 6/6 pt was conversational with mild, intermittent confusion.  Notes that he has been diagnosed with PUD and his PPI dose has been decreased recently.  Ammonia level 13.  On arrival to the ICU patient was minimally responsive and was saturating well on Bipap, so was transitioned to nasal cannula.   Pertinent  Medical History   Past Medical History: No date: Acid reflux No date: Angina No date: Arrhythmia 11/27/2011: CAD (coronary artery disease), stent placed 11/26/11  No date: Chronic kidney disease No date: Dysrhythmia     Comment:  afib No date: Heart murmur No date: Hypertension 11/24/2011: NSTEMI (non-ST elevated myocardial infarction) most likely  as outpatient No date: Prostate cancer ([place]) 11/27/2011: S/P angioplasty with stent, 11/26/11 distal AVGroove LCX  into OM3 with  BMS ([facility]) and rescue PTCA on OM2 jailed by stent.   Significant Hospital Events: Including procedures, antibiotic start and stop dates in addition to other pertinent events   . 6/7 admitted to family medicine for AMS, thrombocytopenia  Interim History / Subjective:   Brief respiratory arrest while at MRI, never lost pulses, transferred to ICU  Objective   Blood pressure 131/60, pulse 70, temperature (!) 97 F (36.1 C), resp. rate (!) 24, SpO2 100 %.    FiO2 (%):  [40 %] 40 %   Intake/Output Summary (Last 24 hours) at 02/21/2021 1448 Last data filed at 02/21/2021 0443 Gross per 24 hour  Intake 1927.08 ml  Output 600 ml  Net 1327.08 ml   There were no vitals filed for this visit.  General:  Frail, elderly M, initially in acute respiratory distress on Bipap HEENT: MM pink/dry Neuro: initially unresponsive though breathing, pupils equal and reactive. Became slightly more alert and opening eyes to voice/lifting head off the bed CV: s1s2 irregular, no m/r/g PULM:  Bronchial breath sounds bilaterally, no significant rhonchi or rales.  Initially tachypnic which improved with bipap GI: soft, bsx4 active  Extremities: warm/dry, no edema  Skin: no rashes or lesions, no rashes   Labs/imaging that I havepersonally reviewed  (right click and "Reselect all SmartList Selections" daily)   CBC-Hgb 8.6 BMP-creatinine 3.8 Ammonia 13 INR 1.7, PTT 17  CXR-no acute changes on my review  Resolved Hospital Problem list     Assessment & Plan:     Brief Respiratory  Arrest and and Bradycardia  While laying flat in MRI P: -Improved on Bipap, ABG without hypoxia, acidosis or hypercarbia, transitioned to nasal cannula after arrival to ICU and tolerating well -Repeat CXR -Confirmed DNI status with Pt's son, he does still want chest compressions and medications and is adamant about this   Altered Mental Status Only mild confusion until about 6/6 MRI brain and ammonia negative, unclear  etiology  P: -No hypercarbia on ABG -Presented with hypothermia -had rare bacteria in urine and no clear infiltrate on CXR, however given AMS with hypothermia will cover with Vanc/Cefepime empirically for 48hrs while awaiting culture results, check lactic acid -consider neuro evaluation      Thrombocytopenia, possible GIB Platelets baseline 130's, down-trending to 57 on 6/6 and then stable P: -holding Eliquis. ASA -with AMS consider developing ITP, peripheral smear pending -repeat coags -small amount dark stool noted, though is on iron supplementation, hemoccult stool, follow CBC, continue PPI  Hypernatremia Na 151 this AM P: -repeat BMP pending, continue D5 and free water flushes   Atrial Fibrillation, CAD s/p stenting, HFpEF On Lasix, Asa, Eliquis, Norvasc, Crestor and Coreg at baseline Echo 4/7 with EF 55-60% P: -currently rate controlled off bb, holding anti-hypertensive and Eliquis   CKD stage III Stable near baseline creatinine of 4 P: -monitor BMET, electrolytes and UOP  Dark stools Noted again on arrival to ICU, though small amount P: -PPI and hemoccult stool, monitor for worsening anemia    Best practice (right click and "Reselect all SmartList Selections" daily)  Diet:  NPO Pain/Anxiety/Delirium protocol (if indicated): No VAP protocol (if indicated): Not indicated DVT prophylaxis: SCD GI prophylaxis: PPI Glucose control:  SSI No Central venous access:  N/A Arterial line:  N/A Foley:  Yes, and it is still needed Mobility:  bed rest  PT consulted: N/A Last date of multidisciplinary goals of care discussion [6/7 son, palliative care and Dr. Loanne Drilling present, has advanced directives stating wants chest compressions, but no intubation. Code Status:  limited Disposition: ICU  Labs   CBC: Recent Labs  Lab 02/20/21 1126 02/20/21 1931 02/21/21 0439  WBC 5.3 5.2 6.0  NEUTROABS 3.2 3.1  --   HGB 9.4* 8.9* 8.6*  HCT 30.8* 28.6* 27.9*  MCV 98.1 97.6  97.9  PLT 57* 65* 58*    Basic Metabolic Panel: Recent Labs  Lab 02/20/21 1126 02/20/21 1931 02/21/21 0439  NA 147* 147* 151*  K 4.1 4.0 4.7  CL 113* 116* 116*  CO2 23 25 23   GLUCOSE 85 157* 66*  BUN 69* 73* 74*  CREATININE 3.97* 3.82* 3.85*  CALCIUM 10.7* 10.2 9.5   GFR: Estimated Creatinine Clearance: 15.4 mL/min (A) (by C-G formula based on SCr of 3.85 mg/dL (H)). Recent Labs  Lab 02/20/21 1126 02/20/21 1931 02/21/21 0439  WBC 5.3 5.2 6.0  LATICACIDVEN  --  1.4  --     Liver Function Tests: Recent Labs  Lab 02/20/21 1126 02/20/21 1931  AST 17 18  ALT 21 20  ALKPHOS 86 79  BILITOT 1.0 0.8  PROT 6.2* 6.1*  ALBUMIN 3.3* 3.4*   No results for input(s): LIPASE, AMYLASE in the last 168 hours. Recent Labs  Lab 02/21/21 0519  AMMONIA 13    ABG    Component Value Date/Time   PHART 7.438 02/20/2021 1948   PCO2ART 35.3 02/20/2021 1948   PO2ART 91.2 02/20/2021 1948   HCO3 23.5 02/20/2021 1948   ACIDBASEDEF 0.0 02/20/2021 1948   O2SAT 97.9 02/20/2021 1948  Coagulation Profile: Recent Labs  Lab 02/20/21 1126 02/20/21 1931  INR 1.5* 1.5*    Cardiac Enzymes: No results for input(s): CKTOTAL, CKMB, CKMBINDEX, TROPONINI in the last 168 hours.  HbA1C: Hgb A1c MFr Bld  Date/Time Value Ref Range Status  11/25/2011 05:30 AM 5.5 <5.7 % Final    Comment:    (NOTE)                                                                       According to the ADA Clinical Practice Recommendations for 2011, when HbA1c is used as a screening test:  >=6.5%   Diagnostic of Diabetes Mellitus           (if abnormal result is confirmed) 5.7-6.4%   Increased risk of developing Diabetes Mellitus References:Diagnosis and Classification of Diabetes Mellitus,Diabetes WEXH,3716,96(VELFY 1):S62-S69 and Standards of Medical Care in         Diabetes - 2011,Diabetes BOFB,5102,58 (Suppl 1):S11-S61.    CBG: Recent Labs  Lab 02/20/21 1934 02/21/21 1410 02/21/21 1416   GLUCAP 158* 88 87    Review of Systems:   Unable to obtain secondary to mental status  Past Medical History:  He,  has a past medical history of Acid reflux, Angina, Arrhythmia, CAD (coronary artery disease), stent placed 11/26/11  (11/27/2011), Chronic kidney disease, Dysrhythmia, Heart murmur, Hypertension, NSTEMI (non-ST elevated myocardial infarction) most likely as outpatient (11/24/2011), Prostate cancer ([place]), and S/P angioplasty with stent, 11/26/11 distal AVGroove LCX into OM3 with BMS ([facility]) and rescue PTCA on OM2 jailed by stent. (11/27/2011).   Surgical History:   Past Surgical History:  Procedure Laterality Date  . CARDIAC CATHETERIZATION    . CATARACT EXTRACTION W/ INTRAOCULAR LENS  IMPLANT, BILATERAL  ~ 2007  . ESOPHAGOGASTRODUODENOSCOPY N/A 12/23/2020   Procedure: ESOPHAGOGASTRODUODENOSCOPY (EGD);  Surgeon: Arta Silence, MD;  Location: [facility] ENDOSCOPY;  Service: Endoscopy;  Laterality: N/A;  . INSERTION PROSTATE RADIATION SEED  2001  . LEFT HEART CATHETERIZATION WITH CORONARY ANGIOGRAM N/A 11/26/2011   Procedure: LEFT HEART CATHETERIZATION WITH CORONARY ANGIOGRAM;  Surgeon: Sanda Klein, MD;  Location: [place] CATH LAB;  Service: Cardiovascular;  Laterality: N/A;  . STAPEDECTOMY  1960; 1980   left; right     Social History:   reports that he quit smoking about 42 years ago. His smoking use included cigars and cigarettes. He has a 7.00 pack-year smoking history. He has never used smokeless tobacco. He reports current alcohol use. He reports that he does not use drugs.   Family History:  His family history includes Hypertension in his father.   Allergies Allergies  Allergen Reactions  . Hydrochlorothiazide Other (See Comments)    Exacerbates gout  . Morphine And Related Other (See Comments)    Hallucinations     Home Medications  Prior to Admission medications   Medication Sig Start Date End Date Taking? Authorizing Provider  acetaminophen (TYLENOL) 325 MG tablet  Take 650 mg by mouth every 8 (eight) hours as needed for mild pain.   Yes [provider]  allopurinol (ZYLOPRIM) 100 MG tablet Take 1 tablet (100 mg total) by mouth daily. 01/04/21  Yes Masoud, Jarrett Soho, MD  amLODipine (NORVASC) 5 MG tablet Take 5 mg by mouth daily.   Yes [provider]  apixaban (ELIQUIS) 2.5 MG TABS tablet Take 1 tablet (2.5 mg total) by mouth 2 (two) times daily. 01/04/21  Yes George Hugh, MD  aspirin EC 81 MG tablet Take 81 mg by mouth daily.   Yes [provider]  bimatoprost (LUMIGAN) 0.03 % ophthalmic solution Place 1 drop into both eyes every evening. 12/07/20  Yes [provider]  carvedilol (COREG) 25 MG tablet Take 25 mg by mouth 2 (two) times daily with a meal.   Yes [provider]  Dextran 70-Hypromellose, PF, 0.1-0.3 % SOLN Place 1 drop into both eyes 3 (three) times daily. Natural Tears PF   Yes [provider]  docusate sodium (COLACE) 100 MG capsule Take 100 mg by mouth daily as needed for mild constipation.   Yes [provider]  ferrous gluconate (FERGON) 324 MG tablet Take 324 mg by mouth 2 (two) times daily.   Yes [provider]  furosemide (LASIX) 20 MG tablet Take 1 tablet (20 mg total) by mouth daily for 3 days. Then return back to the dosing to be taken as needed for weight gain greater than 2 lbs in 24 hours Patient taking differently: Take 20 mg by mouth daily as needed for fluid (for weight gain more than 2 lbs in 24 Hrs). 02/11/21 02/14/21 Yes Dorie Rank, MD  Lidocaine 4 % PTCH Apply 1 patch topically See admin instructions. Apply one patch to bilateral upper arms every morning,  remove at bedtime   Yes [provider]  Menthol, Topical Analgesic, (BIOFREEZE) 4 % GEL Apply 1 application topically 2 (two) times daily. Apply to back of neck,shoulders and upper arms.   Yes [provider]  [facility] powder Apply 1 application topically in the morning, at noon, in the  evening, and at bedtime. Apply to groin, scrotal and thigh area 02/14/21  Yes [provider]  Omega-3 Fatty Acids (FISH OIL) 1000 MG CAPS Take 1,000 mg by mouth daily.   Yes [provider]  pantoprazole (PROTONIX) 40 MG tablet Take 1 tablet (40 mg total) by mouth 2 (two) times daily before a meal. Patient taking differently: Take 40 mg by mouth daily. 01/04/21  Yes Masoud, Jarrett Soho, MD  Polyethyl Glyc-Propyl Glyc PF (SYSTANE ULTRA PF) 0.4-0.3 % SOLN Place 1 drop into both eyes daily.   Yes [provider]  rosuvastatin (CRESTOR) 5 MG tablet Take 5 mg by mouth See admin instructions. Take one tablet (5 mg) by mouth on Monday, Wednesday, Friday at bedtime   Yes [provider]  vitamin B-12 (CYANOCOBALAMIN) 1000 MCG tablet Take 1,000 mcg by mouth daily.   Yes [provider]  lidocaine (LIDODERM) 5 % Place 1 patch onto the skin daily. Remove & Discard patch within 12 hours or as directed by MD Patient not taking: No sig reported 01/04/21   George Hugh, MD     Critical care time: 50 minutes       CRITICAL CARE Performed by: Otilio Carpen Donnelle Olmeda   Total critical care time: 50 minutes  Critical care time was exclusive of separately billable procedures and treating other patients.  Critical care was necessary to treat or prevent imminent or life-threatening deterioration.  Critical care was time spent personally by me on the following activities: development of treatment plan with patient and/or surrogate as well as nursing, discussions with consultants, evaluation of patient's response to treatment, examination of patient, obtaining history from patient or surrogate, ordering and performing treatments and interventions, ordering and  review of laboratory studies, ordering and review of radiographic studies, pulse oximetry and re-evaluation of patient's condition.    Otilio Carpen Jhoana Upham, PA-C [facility] See Amion for pager If no  response to pager , please call [phone] until 7pm After 7:00 pm call Elink  [phone]

## 2021-02-21 NOTE — Progress Notes (Signed)
Patient unavailable for EEG at this time; will attempt later as schedule permits.

## 2021-02-21 NOTE — Consult Note (Signed)
Consultation Note Date: 02/21/2021   Patient Name: [name] [name]  DOB: 10-30-1932  MRN: [id number]  Age / Sex: 87 y.o., male  PCP: [name] Contras, MD Referring Physician: Shelly Coss, MD  Reason for Consultation: Establishing goals of care  HPI/Patient Profile: 87 y.o. male  with past medical history of hypertension, coronary disease status post stenting, persistent atrial fibrillation on Eliquis, CKD stage IIIb-IV( follows with Dr [name] [name] kidney,baseline creatinine in the range of 4), confusion/possible early onset dementia, chronic systolic heart failure with ejection fraction of 45 to 50%, anemia, admitted on 02/20/2021 [facility] with worsening mental status, hypothermia and hypernatremia.   Code blue was called 6/7 at 2:13pm for brief PEA arrest and patient was transferred to ICU on 100% NRB. Palliative care has been consulted to assist with goals of care conversation.  Clinical Assessment and Goals of Care:  I have reviewed medical records including EPIC notes, labs and imaging, received report from RN, assessed the patient and then met at the bedside along with his son [name] [name] to discuss diagnosis, prognosis, GOC, EOL wishes, disposition and options.  I introduced Palliative Medicine as specialized medical care for people living with serious illness. It focuses on providing relief from the symptoms and stress of a serious illness. The goal is to improve quality of life for both the patient and the family.  We discussed a brief life review of the patient and then focused on their current illness. [name] [name] tells me that [name] [name] is from [place] and also spends winters in the [place]. 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 Patient moved to [place] in the recent months to be closer to family, due to his worsening weakness and recurrent falls. He resides at [facility].  [name] [name] and his sister [name] [name] both live locally and visit regularly. They both have their own health conditions that prohibit them from providing care at home. Patient also has 3 other daughters who still live in [place]. Sadly, his wife passed away in 22-[name] [name]. [name] [name] states that the patient has never really processed this difficult loss. Patient did however decide at that point to complete his advance care directive/living will. This experience has influenced his decision to refuse intubation. See below and in Vynca:  -No breathing tubes -No feeding tubes -Provide maximum pain relief, even if it may hasten my death  I attempted to elicit values and goals of care important to the patient.   [name] [name] shares his concerns that patient's quality of life has rapidly declined. He states that the orthopedic providers were quite confident that surgical intervention would result in meaningful improvements. It is important for him to make decisions with his siblings as a family and remain in constant communication. He also strongly desires to honor patient's wishes for an attempt of CPR but NO intubation.    The difference between aggressive medical intervention and comfort care was considered in light of the patient's goals of care.   Advanced directives, concepts  specific to code status, artifical feeding and hydration, and rehospitalization were considered and discussed.  Discussed the importance of continued conversation with family and the medical providers regarding overall plan of care and treatment options, ensuring decisions are within the context of the patient's values and GOCs.    Questions and concerns were addressed. The family was encouraged to call with questions or concerns.  PMT will continue to support holistically.   HCPOA are patient's daughters [name] [name] and [name] [name].    SUMMARY OF RECOMMENDATIONS   -Confirmed partial code status - NO intubation -Patient's son [name] [name] will discuss  option of comfort care further with his siblings -Patient has stated in the past that he wants an attempt of CPR. If intubation would be required after CPR, he would desire to stop resuscitation efforts -Will scan copy of patient's AD/HCPOA documentation into EMR. Copy placed in patient's hard chart -Patient's MOST form in hard chart is current -Psychosocial and emotional support provided -PMT will continue to follow  Code Status/Advance Care Planning:  Limited code   Palliative Prophylaxis:   Bowel Regimen and Turn Reposition  Additional Recommendations (Limitations, Scope, Preferences):  Full Scope Treatment  Psycho-social/Spiritual:   Desire for further Chaplaincy support:TBD  Prognosis:   Poor prognosis given patient's comorbidities, functional and cognitive decline  Discharge Planning: To Be Determined      Primary Diagnoses: Present on Admission: . Hypothermia . AMS (altered mental status)   I have reviewed the medical record, interviewed the patient and family, and examined the patient. The following aspects are pertinent.  Past Medical History:  Diagnosis Date  . Acid reflux   . Angina   . Arrhythmia   . CAD (coronary artery disease), stent placed 11/26/11  11/27/2011  . Chronic kidney disease   . Dysrhythmia    afib  . Heart murmur   . Hypertension   . NSTEMI (non-ST elevated myocardial infarction) most likely as outpatient 11/24/2011  . Prostate cancer ([place])   . S/P angioplasty with stent, 11/26/11 distal AVGroove LCX into OM3 with BMS (Integrity) and rescue PTCA on OM2 jailed by stent. 11/27/2011   Social History   Socioeconomic History  . Marital status: Widowed    Spouse name: Not on file  . Number of children: Not on file  . Years of education: Not on file  . Highest education level: Not on file  Occupational History  . Not on file  Tobacco Use  . Smoking status: Former Smoker    Packs/day: 0.50    Years: 14.00    Pack years: 7.00    Types:  Cigars, Cigarettes    Quit date: 09/17/1978    Years since quitting: 42.4  . Smokeless tobacco: Never Used  Vaping Use  . Vaping Use: Never used  Substance and Sexual Activity  . Alcohol use: Yes    Comment: 11/23/11 "haven't drank anything  since ~ 2008"  . Drug use: No  . Sexual activity: Never  Other Topics Concern  . Not on file  Social History Narrative  . Not on file   Social Determinants of Health   Financial Resource Strain: Not on file  Food Insecurity: Not on file  Transportation Needs: Not on file  Physical Activity: Not on file  Stress: Not on file  Social Connections: Not on file   Family History  Problem Relation Age of Onset  . Hypertension Father    Scheduled Meds: . chlorhexidine  15 mL Mouth Rinse BID  . Chlorhexidine Gluconate  Cloth  6 each Topical Daily  . free water  150 mL Per Tube Q8H  . mouth rinse  15 mL Mouth Rinse q12n4p  . [START ON 02/22/2021] rosuvastatin  5 mg Oral Q M,W,F-1800  . tamsulosin  0.4 mg Oral Daily   Continuous Infusions: . ceFEPime (MAXIPIME) IV    . [START ON 02/22/2021] ceFEPime (MAXIPIME) IV    . dextrose 75 mL/hr at 02/21/21 0854  . vancomycin     PRN Meds:.ondansetron (ZOFRAN) IV Medications Prior to Admission:  Prior to Admission medications   Medication Sig Start Date End Date Taking? Authorizing Provider  acetaminophen (TYLENOL) 325 MG tablet Take 650 mg by mouth every 8 (eight) hours as needed for mild pain.   Yes [provider]  allopurinol (ZYLOPRIM) 100 MG tablet Take 1 tablet (100 mg total) by mouth daily. 01/04/21  Yes Masoud, Jarrett Soho, MD  amLODipine (NORVASC) 5 MG tablet Take 5 mg by mouth daily.   Yes [provider]  apixaban (ELIQUIS) 2.5 MG TABS tablet Take 1 tablet (2.5 mg total) by mouth 2 (two) times daily. 01/04/21  Yes George Hugh, MD  aspirin EC 81 MG tablet Take 81 mg by mouth daily.   Yes [provider]  bimatoprost (LUMIGAN) 0.03 % ophthalmic solution Place 1 drop into both  eyes every evening. 12/07/20  Yes [provider]  carvedilol (COREG) 25 MG tablet Take 25 mg by mouth 2 (two) times daily with a meal.   Yes [provider]  Dextran 70-Hypromellose, PF, 0.1-0.3 % SOLN Place 1 drop into both eyes 3 (three) times daily. Natural Tears PF   Yes [provider]  docusate sodium (COLACE) 100 MG capsule Take 100 mg by mouth daily as needed for mild constipation.   Yes [provider]  ferrous gluconate (FERGON) 324 MG tablet Take 324 mg by mouth 2 (two) times daily.   Yes [provider]  furosemide (LASIX) 20 MG tablet Take 1 tablet (20 mg total) by mouth daily for 3 days. Then return back to the dosing to be taken as needed for weight gain greater than 2 lbs in 24 hours Patient taking differently: Take 20 mg by mouth daily as needed for fluid (for weight gain more than 2 lbs in 24 Hrs). 02/11/21 02/14/21 Yes Dorie Rank, MD  Lidocaine 4 % PTCH Apply 1 patch topically See admin instructions. Apply one patch to bilateral upper arms every morning,  remove at bedtime   Yes [provider]  Menthol, Topical Analgesic, (BIOFREEZE) 4 % GEL Apply 1 application topically 2 (two) times daily. Apply to back of neck,shoulders and upper arms.   Yes [provider]  [facility] powder Apply 1 application topically in the morning, at noon, in the evening, and at bedtime. Apply to groin, scrotal and thigh area 02/14/21  Yes [provider]  Omega-3 Fatty Acids (FISH OIL) 1000 MG CAPS Take 1,000 mg by mouth daily.   Yes [provider]  pantoprazole (PROTONIX) 40 MG tablet Take 1 tablet (40 mg total) by mouth 2 (two) times daily before a meal. Patient taking differently: Take 40 mg by mouth daily. 01/04/21  Yes Masoud, Jarrett Soho, MD  Polyethyl Glyc-Propyl Glyc PF (SYSTANE ULTRA PF) 0.4-0.3 % SOLN Place 1 drop into both eyes daily.   Yes [provider]  rosuvastatin (CRESTOR) 5 MG tablet Take 5 mg by mouth See  admin instructions. Take one tablet (5 mg) by mouth on Monday, Wednesday, Friday at bedtime  Yes [provider]  vitamin B-12 (CYANOCOBALAMIN) 1000 MCG tablet Take 1,000 mcg by mouth daily.   Yes [provider]  lidocaine (LIDODERM) 5 % Place 1 patch onto the skin daily. Remove & Discard patch within 12 hours or as directed by MD Patient not taking: No sig reported 01/04/21   George Hugh, MD   Allergies  Allergen Reactions  . Hydrochlorothiazide Other (See Comments)    Exacerbates gout  . Morphine And Related Other (See Comments)    Hallucinations   Review of Systems  Unable to perform ROS: Mental status change    Physical Exam Vitals and nursing note reviewed.  Constitutional:      Appearance: He is ill-appearing.     Interventions: Face mask in place.  Cardiovascular:     Rate and Rhythm: Normal rate.  Pulmonary:     Effort: Tachypnea present.  Neurological:     Mental Status: He is unresponsive.    Vital Signs: BP 131/60 (BP Location: Left Arm)   Pulse 70   Temp (!) 97 F (36.1 C)   Resp (!) 24   SpO2 100%  Pain Scale: Faces     SpO2: SpO2: 100 % O2 Device:SpO2: 100 % O2 Flow Rate: .   IO: Intake/output summary:   Intake/Output Summary (Last 24 hours) at 02/21/2021 1540 Last data filed at 02/21/2021 0443 Gross per 24 hour  Intake 1927.08 ml  Output 600 ml  Net 1327.08 ml    LBM: Last BM Date: 02/21/21 Baseline Weight:   Most recent weight:       Palliative Assessment/Data: 10%    Time In: 1500 Time Out: 1600 Time Total: 60 minutes Greater than 50% of this time was spent in counseling and coordinating care related to the above assessment and plan.  Dorthy Cooler, PA-C Palliative Medicine Team Team phone # [phone]  Thank you for allowing the Palliative Medicine Team to assist in the care of this patient. Please utilize secure chat with additional questions, if there is no response within 30 minutes please call the above  phone number.  Palliative Medicine Team providers are available by phone from 7am to 7pm daily and can be reached through the team cell phone.  Should this patient require assistance outside of these hours, please call the patient's attending physician.

## 2021-02-21 NOTE — Progress Notes (Signed)
PHARMACY - PHYSICIAN COMMUNICATION CRITICAL VALUE ALERT - BLOOD CULTURE IDENTIFICATION (BCID)  [name] [name] is an 87 y.o. male who presented to [facility] on 02/20/2021 with a chief complaint of AMS.  Assessment: sepsis of unclear source  Name of physician (or Provider) Contacted: Dr. Clearence Ped  Current antibiotics: Vancomycin and Cefepime   Changes to prescribed antibiotics recommended:  Patient is on recommended antibiotics - No changes needed  Results for orders placed or performed during the hospital encounter of 02/20/21  Blood Culture ID Panel (Reflexed) (Collected: 02/20/2021  7:31 PM)  Result Value Ref Range   Enterococcus faecalis NOT DETECTED NOT DETECTED   Enterococcus Faecium NOT DETECTED NOT DETECTED   Listeria monocytogenes NOT DETECTED NOT DETECTED   Staphylococcus species DETECTED (A) NOT DETECTED   Staphylococcus aureus (BCID) NOT DETECTED NOT DETECTED   Staphylococcus epidermidis DETECTED (A) NOT DETECTED   Staphylococcus lugdunensis NOT DETECTED NOT DETECTED   Streptococcus species NOT DETECTED NOT DETECTED   Streptococcus agalactiae NOT DETECTED NOT DETECTED   Streptococcus pneumoniae NOT DETECTED NOT DETECTED   Streptococcus pyogenes NOT DETECTED NOT DETECTED   A.calcoaceticus-baumannii NOT DETECTED NOT DETECTED   Bacteroides fragilis NOT DETECTED NOT DETECTED   Enterobacterales NOT DETECTED NOT DETECTED   Enterobacter cloacae complex NOT DETECTED NOT DETECTED   Escherichia coli NOT DETECTED NOT DETECTED   Klebsiella aerogenes NOT DETECTED NOT DETECTED   Klebsiella oxytoca NOT DETECTED NOT DETECTED   Klebsiella pneumoniae NOT DETECTED NOT DETECTED   Proteus species NOT DETECTED NOT DETECTED   Salmonella species NOT DETECTED NOT DETECTED   Serratia marcescens NOT DETECTED NOT DETECTED   Haemophilus influenzae NOT DETECTED NOT DETECTED   Neisseria meningitidis NOT DETECTED NOT DETECTED   Pseudomonas aeruginosa NOT DETECTED NOT DETECTED   Stenotrophomonas  maltophilia NOT DETECTED NOT DETECTED   Candida albicans NOT DETECTED NOT DETECTED   Candida auris NOT DETECTED NOT DETECTED   Candida glabrata NOT DETECTED NOT DETECTED   Candida krusei NOT DETECTED NOT DETECTED   Candida parapsilosis NOT DETECTED NOT DETECTED   Candida tropicalis NOT DETECTED NOT DETECTED   Cryptococcus neoformans/gattii NOT DETECTED NOT DETECTED   Methicillin resistance mecA/C NOT DETECTED NOT DETECTED    [name] [name] 02/21/2021  9:01 PM

## 2021-02-21 NOTE — Progress Notes (Signed)
Pt arrived to room 1401 by MRI tech.  Transferred from stretcher to bed. Was turning pt when he became apenic and pulse was present but thready. Pt respond to sternal rub x1 and RR RN called to bedside. Prior to RR RN arrival pt became unresponsive to sternal rub and Code Blue was called. Respiratory Therapy and RR RN arrived to bedside and Bipap initiated by RT. MD (Dr Tawanna Solo) called and arrived to bedside.  Pt transferred to ICU/SD. [name] [name], Laurel Dimmer, RN

## 2021-02-21 NOTE — Evaluation (Signed)
SLP Cancellation Note  Patient Details Name: Mykell Rawl MRN: [id number] DOB: 06-15-1933   Cancelled treatment:       Reason Eval/Treat Not Completed: Medical issues which prohibited therapy (pt with medical emergency when arrived to floor from MRI, apneic and code blue called, will continue efforts)  Kathleen Lime, MS [facility] SLP Acute Rehab Services Office [phone] Pager [phone]   Macario Golds 02/21/2021, 3:40 PM

## 2021-02-21 NOTE — Progress Notes (Signed)
Pharmacy Antibiotic Note  [name] [name] is a 87 y.o. male admitted on 02/20/2021 with AMS, hypothermia, thrombocytopenia. Patient had brief respiratory arrest with bradycardia and was transferred to ICU 02/21/2021. Pharmacy has been consulted for Vancomycin and Cefepime dosing for sepsis.  Plan: Vancomycin 1750mg  IV x 1 now. Will dose Vancomycin based on levels due to renal dysfunction. Cefepime 2g IV q24h. Monitor renal function, cultures, clinical course.      Temp (24hrs), Avg:96.6 F (35.9 C), Min:93.2 F (34 C), Max:98.6 F (37 C)  Recent Labs  Lab 02/20/21 1126 02/20/21 1931 02/21/21 0439  WBC 5.3 5.2 6.0  CREATININE 3.97* 3.82* 3.85*  LATICACIDVEN  --  1.4  --     Estimated Creatinine Clearance: 15.4 mL/min (A) (by C-G formula based on SCr of 3.85 mg/dL (H)).    Allergies  Allergen Reactions  . Hydrochlorothiazide Other (See Comments)    Exacerbates gout  . Morphine And Related Other (See Comments)    Hallucinations    Antimicrobials this admission: Vancomycin >> Cefepime >>  Dose adjustments this admission: --  Microbiology results: 6/6 BCx: sent 6/6 UCx: sent  6/7 MRSA PCR: sent  Thank you for allowing pharmacy to be a part of this patient's care.  Luiz Ochoa 02/21/2021 3:34 PM

## 2021-02-21 NOTE — TOC Initial Note (Signed)
Transition of Care [facility]) - Initial/Assessment Note    Patient Details  Name: [name] [name] MRN: [id number] Date of Birth: 02-13-1933  [facility]) CM/SW Contact:    Dessa Phi, RN Phone Number: 02/21/2021, 2:40 PM  Clinical Narrative:Spoke to son Chrystian-patient from [address]-he is not sure of d/c plan await more info from medical team to decide services.Continue to monitor.                   Expected Discharge Plan: [place] Barriers to Discharge: Continued Medical Work up   Patient Goals and CMS Choice Patient states their goals for this hospitalization and ongoing recovery are:: return to [facility] if possible or may need palliative care. CMS Medicare.gov Compare Post Acute Care list provided to:: Patient Represenative (must comment) (son Malyk [phone]) Choice offered to / list presented to : Adult Children  Expected Discharge Plan and Services Expected Discharge Plan: [place]   Discharge Planning Services: CM Consult Post Acute Care Choice: [place] Living arrangements for the past 2 months: [place]                                      Prior Living Arrangements/Services Living arrangements for the past 2 months: [place] Lives with:: Facility Resident Patient language and need for interpreter reviewed:: Yes Do you feel safe going back to the place where you live?: Yes      Need for Family Participation in Patient Care: No (Comment) Care giver support system in place?: Yes (comment)   Criminal Activity/Legal Involvement Pertinent to Current Situation/Hospitalization: No - Comment as needed  Activities of Daily Living Home Assistive Devices/Equipment: Hearing aid,Blood pressure cuff,Wheelchair,Walker (specify type),Eyeglasses ADL Screening (condition at time of admission) Patient's cognitive ability adequate to safely complete daily activities?: Yes Is  the patient deaf or have difficulty hearing?: Yes (wears hearing aids) Does the patient have difficulty seeing, even when wearing glasses/contacts?: Yes Does the patient have difficulty concentrating, remembering, or making decisions?: Yes Patient able to express need for assistance with ADLs?: Yes Does the patient have difficulty dressing or bathing?: Yes Independently performs ADLs?: No Communication: Independent Dressing (OT): Needs assistance Is this a change from baseline?: Pre-admission baseline Grooming: Needs assistance Is this a change from baseline?: Pre-admission baseline Feeding: Needs assistance Is this a change from baseline?: Pre-admission baseline Bathing: Needs assistance Is this a change from baseline?: Pre-admission baseline Toileting: Needs assistance Is this a change from baseline?: Pre-admission baseline In/Out Bed: Needs assistance Is this a change from baseline?: Pre-admission baseline Walks in Home: Needs assistance Is this a change from baseline?: Pre-admission baseline Does the patient have difficulty walking or climbing stairs?: Yes (secondary to lethargy and weakness) Weakness of Legs: Both Weakness of Arms/Hands: None  Permission Sought/Granted Permission sought to share information with : Case Manager Permission granted to share information with : Yes, Verbal Permission Granted  Share Information with NAME: Case Manager     Permission granted to share info w Relationship: Lateef son [phone]     Emotional Assessment Appearance:: Appears stated age Attitude/Demeanor/Rapport: Unable to Assess Affect (typically observed): Calm Orientation: : Fluctuating Orientation (Suspected and/or reported Sundowners) Alcohol / Substance Use: Other (comment) Psych Involvement: No (comment)  Admission diagnosis:  Metabolic encephalopathy [W41.32] Dehydration [E86.0] Hypernatremia [E87.0] Hypothermia [T68.XXXA] Thrombocytopenia ([place]) [D69.6] Failure to  thrive in adult [  R62.7] Chronic anemia [D64.9] Constipation [K59.00] Hypothermia, initial encounter [T68.XXXA] AMS (altered mental status) [R41.82] Patient Active Problem List   Diagnosis Date Noted  . Hypothermia 02/21/2021  . Acute metabolic encephalopathy 96/29/5284  . Thrombocytopenia ([place]) 02/21/2021  . Anemia 02/21/2021  . Cervical stenosis of spine 02/21/2021  . Atrial fibrillation ([place]) 02/21/2021  . Hypernatremia 02/21/2021  . AMS (altered mental status) 02/21/2021  . Hyponatremia 12/21/2020  . Multiple fractures of ribs, bilateral, initial encounter for closed fracture 12/21/2020  . Falls 12/21/2020  . Acute kidney injury superimposed on chronic kidney disease ([place]) 12/21/2020  . Foreign body in stomach 12/21/2020  . Iron deficiency anemia 12/21/2020  . Obesity (BMI 30-39.9) 12/21/2020  . Hypercalcemia 12/21/2020  . CAD (coronary artery disease), stent placed 11/26/11  11/27/2011  . S/P angioplasty with stent, 11/26/11 distal AVGroove LCX into OM3 with BMS (Integrity) and rescue PTCA on OM2 jailed by stent. 11/27/2011  . NSTEMI (non-ST elevated myocardial infarction) most likely as outpatient 11/24/2011  . CKD (chronic kidney disease) stage 3, GFR 30-59 ml/min (HCC) 11/24/2011  . Chest pain 11/23/2011  . HTN (hypertension) 11/23/2011  . A-fib, New onset 11/23/2011  . Acid reflux    PCP:  Antony Contras, MD Pharmacy:   [place] [address] Phone: [phone] Fax: [phone]     Social Determinants of Health (SDOH) Interventions    Readmission Risk Interventions No flowsheet data found.

## 2021-02-21 NOTE — H&P (Signed)
History and Physical    [name] [name] VXB:[id number] DOB: Nov 17, 1932 DOA: 02/20/2021  PCP: Antony Contras, MD  Patient coming from: Rehab  I have personally briefly reviewed patient's old medical records in [place]  Chief Complaint: AMS  HPI: [name] [name] is a 87 y.o. male with medical history significant for HTN, CAD s/p stent, atrial fibrillation on Eliquis, CKD 3b, and GERD who presents with AMS.   Pt was hospitalized in early April at [facility] after a mechanical fall and suffered rib fractures and was fluid overloaded. He was discharged to rehab for deconditioning after 2 weeks of hospitalization. Had worsening upper extremity  weakness and followed with orthopedic. Had MRI cervical spine showing moderate to severe spinal narrowing at multiple levels. Pt was sent for pre-op today for planned cervical decompression surgery on 6/9. Lab work returned with low platelet so he presented to ED for evaluation. Son at bedside presents hx since pt barely able to speak. Pt has had continuous worsening decline of his mobility for the past week due to his cervical stenosis and had fallen at least 5 times. Staff at rehab placed his mattress on the ground to prevent more falls. For the past 2 days he also had an acute decline in mention. Usually he is alert, oriented and communicative but today very confused and not even able to talk. Son says he complaint about abdomen pain prior to arrival here and is not sure when his last bowel movement was.   In the ED, he was hypothermic with rectal temp of 93.2. No leukocytosis. Hgb at baseline of 8.9. Platelet at 65 which he has been chronically low or at low normal for at least past 2 months. Na of 147. Creatinine of 3.82 with possible baseline of 3-4. BG of 157.  UA negative. CT head negative. CT cervical spine without acute change. COVID/Flu negative.   Review of Systems: Unable to obtain given patient's altered mental status   Past Medical History:   Diagnosis Date  . Acid reflux   . Angina   . Arrhythmia   . CAD (coronary artery disease), stent placed 11/26/11  11/27/2011  . Chronic kidney disease   . Dysrhythmia    afib  . Heart murmur   . Hypertension   . NSTEMI (non-ST elevated myocardial infarction) most likely as outpatient 11/24/2011  . Prostate cancer ([place])   . S/P angioplasty with stent, 11/26/11 distal AVGroove LCX into OM3 with BMS (Integrity) and rescue PTCA on OM2 jailed by stent. 11/27/2011    Past Surgical History:  Procedure Laterality Date  . CARDIAC CATHETERIZATION    . CATARACT EXTRACTION W/ INTRAOCULAR LENS  IMPLANT, BILATERAL  ~ 2007  . ESOPHAGOGASTRODUODENOSCOPY N/A 12/23/2020   Procedure: ESOPHAGOGASTRODUODENOSCOPY (EGD);  Surgeon: Arta Silence, MD;  Location: [facility] ENDOSCOPY;  Service: Endoscopy;  Laterality: N/A;  . INSERTION PROSTATE RADIATION SEED  2001  . LEFT HEART CATHETERIZATION WITH CORONARY ANGIOGRAM N/A 11/26/2011   Procedure: LEFT HEART CATHETERIZATION WITH CORONARY ANGIOGRAM;  Surgeon: Sanda Klein, MD;  Location: [facility] CATH LAB;  Service: Cardiovascular;  Laterality: N/A;  . STAPEDECTOMY  1960; 1980   left; right     reports that he quit smoking about 42 years ago. His smoking use included cigars and cigarettes. He has a 7.00 pack-year smoking history. He has never used smokeless tobacco. He reports current alcohol use. He reports that he does not use drugs. Social History  Allergies  Allergen Reactions  . Hydrochlorothiazide Other (See Comments)  Exacerbates gout  . Morphine And Related Other (See Comments)    Hallucinations    Family History  Problem Relation Age of Onset  . Hypertension Father      Prior to Admission medications   Medication Sig Start Date End Date Taking? Authorizing Provider  acetaminophen (TYLENOL) 325 MG tablet Take 650 mg by mouth every 8 (eight) hours as needed for mild pain.   Yes [provider]  allopurinol (ZYLOPRIM) 100 MG tablet Take 1 tablet  (100 mg total) by mouth daily. 01/04/21  Yes Masoud, Jarrett Soho, MD  amLODipine (NORVASC) 5 MG tablet Take 5 mg by mouth daily.   Yes [provider]  apixaban (ELIQUIS) 2.5 MG TABS tablet Take 1 tablet (2.5 mg total) by mouth 2 (two) times daily. 01/04/21  Yes George Hugh, MD  aspirin EC 81 MG tablet Take 81 mg by mouth daily.   Yes [provider]  bimatoprost (LUMIGAN) 0.03 % ophthalmic solution Place 1 drop into both eyes every evening. 12/07/20  Yes [provider]  carvedilol (COREG) 25 MG tablet Take 25 mg by mouth 2 (two) times daily with a meal.   Yes [provider]  Dextran 70-Hypromellose, PF, 0.1-0.3 % SOLN Place 1 drop into both eyes 3 (three) times daily. Natural Tears PF   Yes [provider]  docusate sodium (COLACE) 100 MG capsule Take 100 mg by mouth daily as needed for mild constipation.   Yes [provider]  ferrous gluconate (FERGON) 324 MG tablet Take 324 mg by mouth 2 (two) times daily.   Yes [provider]  furosemide (LASIX) 20 MG tablet Take 1 tablet (20 mg total) by mouth daily for 3 days. Then return back to the dosing to be taken as needed for weight gain greater than 2 lbs in 24 hours Patient taking differently: Take 20 mg by mouth daily as needed for fluid (for weight gain more than 2 lbs in 24 Hrs). 02/11/21 02/14/21 Yes Dorie Rank, MD  Lidocaine 4 % PTCH Apply 1 patch topically See admin instructions. Apply one patch to bilateral upper arms every morning,  remove at bedtime   Yes [provider]  Menthol, Topical Analgesic, (BIOFREEZE) 4 % GEL Apply 1 application topically 2 (two) times daily. Apply to back of neck,shoulders and upper arms.   Yes [provider]  [facility] powder Apply 1 application topically in the morning, at noon, in the evening, and at bedtime. Apply to groin, scrotal and thigh area 02/14/21  Yes [provider]  Omega-3 Fatty Acids (FISH OIL) 1000 MG CAPS Take 1,000  mg by mouth daily.   Yes [provider]  pantoprazole (PROTONIX) 40 MG tablet Take 1 tablet (40 mg total) by mouth 2 (two) times daily before a meal. Patient taking differently: Take 40 mg by mouth daily. 01/04/21  Yes Masoud, Jarrett Soho, MD  Polyethyl Glyc-Propyl Glyc PF (SYSTANE ULTRA PF) 0.4-0.3 % SOLN Place 1 drop into both eyes daily.   Yes [provider]  rosuvastatin (CRESTOR) 5 MG tablet Take 5 mg by mouth See admin instructions. Take one tablet (5 mg) by mouth on Monday, Wednesday, Friday at bedtime   Yes [provider]  vitamin B-12 (CYANOCOBALAMIN) 1000 MCG tablet Take 1,000 mcg by mouth daily.   Yes [provider]  lidocaine (LIDODERM) 5 % Place 1 patch onto the skin daily. Remove & Discard patch within 12 hours or as directed by MD Patient not taking: No sig reported 01/04/21  George Hugh, MD    Physical Exam: Vitals:   02/20/21 2215 02/20/21 2230 02/20/21 2319 02/21/21 0000  BP:  121/76 119/70 (!) 113/100  Pulse: 68 (!) 53 64 74  Resp: 11 12 (!) 22 19  Temp: (!) 94.7 F (34.8 C) (!) 95 F (35 C) (!) 95.8 F (35.4 C) (!) 96.1 F (35.6 C)  TempSrc:      SpO2: 100% 100% 97% 98%    Constitutional: NAD, calm, confused appearing elderly male laying at 20 degree incline in bed. Pt only able to make incoherent noise when asked questions. Could not follow commands.  Vitals:   02/20/21 2215 02/20/21 2230 02/20/21 2319 02/21/21 0000  BP:  121/76 119/70 (!) 113/100  Pulse: 68 (!) 53 64 74  Resp: 11 12 (!) 22 19  Temp: (!) 94.7 F (34.8 C) (!) 95 F (35 C) (!) 95.8 F (35.4 C) (!) 96.1 F (35.6 C)  TempSrc:      SpO2: 100% 100% 97% 98%   Eyes: PERRL, lids and conjunctivae normal ENMT: Mucous membranes are moist.  Neck: normal, supple Respiratory: clear to auscultation bilaterally, no wheezing, no crackles. Normal respiratory effort. No accessory muscle use.  Cardiovascular: Regular rate and rhythm, no murmurs / rubs / gallops.  Nonpitting edema of bilateral hands Abdomen: no tenderness, no masses palpated. Bowel sounds positive.  Rectal: dark dry stool noted around rectal and buttocks region Musculoskeletal: no clubbing / cyanosis. No joint deformity upper and lower extremities.  Skin: no rashes, lesions, ulcers. No induration Neurologic: Alert but oriented.  Unable to follow any commands and only moans and makes incoherent noise with questioning.   Psychiatric: Disoriented.   Labs on Admission: I have personally reviewed following labs and imaging studies  CBC: Recent Labs  Lab 02/20/21 1126 02/20/21 1931  WBC 5.3 5.2  NEUTROABS 3.2 3.1  HGB 9.4* 8.9*  HCT 30.8* 28.6*  MCV 98.1 97.6  PLT 57* 65*   Basic Metabolic Panel: Recent Labs  Lab 02/20/21 1126 02/20/21 1931  NA 147* 147*  K 4.1 4.0  CL 113* 116*  CO2 23 25  GLUCOSE 85 157*  BUN 69* 73*  CREATININE 3.97* 3.82*  CALCIUM 10.7* 10.2   GFR: Estimated Creatinine Clearance: 15.5 mL/min (A) (by C-G formula based on SCr of 3.82 mg/dL (H)). Liver Function Tests: Recent Labs  Lab 02/20/21 1126 02/20/21 1931  AST 17 18  ALT 21 20  ALKPHOS 86 79  BILITOT 1.0 0.8  PROT 6.2* 6.1*  ALBUMIN 3.3* 3.4*   No results for input(s): LIPASE, AMYLASE in the last 168 hours. No results for input(s): AMMONIA in the last 168 hours. Coagulation Profile: Recent Labs  Lab 02/20/21 1126 02/20/21 1931  INR 1.5* 1.5*   Cardiac Enzymes: No results for input(s): CKTOTAL, CKMB, CKMBINDEX, TROPONINI in the last 168 hours. BNP (last 3 results) No results for input(s): PROBNP in the last 8760 hours. HbA1C: No results for input(s): HGBA1C in the last 72 hours. CBG: Recent Labs  Lab 02/20/21 1934  GLUCAP 158*   Lipid Profile: No results for input(s): CHOL, HDL, LDLCALC, TRIG, CHOLHDL, LDLDIRECT in the last 72 hours. Thyroid Function Tests: No results for input(s): TSH, T4TOTAL, FREET4, T3FREE, THYROIDAB in the last 72 hours. Anemia Panel: No  results for input(s): VITAMINB12, FOLATE, FERRITIN, TIBC, IRON, RETICCTPCT in the last 72 hours. Urine analysis:    Component Value Date/Time   COLORURINE YELLOW 02/20/2021 2030   APPEARANCEUR CLEAR 02/20/2021 2030   LABSPEC 1.013  02/20/2021 2030   [place] 5.0 02/20/2021 2030   GLUCOSEU NEGATIVE 02/20/2021 2030   HGBUR NEGATIVE 02/20/2021 2030   [place] NEGATIVE 02/20/2021 2030   [place] 02/20/2021 2030   PROTEINUR 30 (A) 02/20/2021 2030   UROBILINOGEN 0.2 11/23/2011 1335   NITRITE NEGATIVE 02/20/2021 2030   LEUKOCYTESUR NEGATIVE 02/20/2021 2030    Radiological Exams on Admission: CT Head Wo Contrast  Result Date: 02/20/2021 CLINICAL DATA:  Thrombocytopenia, altered mental status EXAM: CT HEAD WITHOUT CONTRAST TECHNIQUE: Contiguous axial images were obtained from the base of the skull through the vertex without intravenous contrast. COMPARISON:  02/11/2021 FINDINGS: Brain: Normal anatomic configuration. Parenchymal volume loss is commensurate with the patient's age. Mild periventricular white matter changes are present likely reflecting the sequela of small vessel ischemia. No abnormal intra or extra-axial mass lesion or fluid collection. No abnormal mass effect or midline shift. No evidence of acute intracranial hemorrhage or infarct. Ventricular size is normal. Cerebellum unremarkable. Vascular: No asymmetric hyperdense vasculature at the skull base. Skull: Intact Sinuses/Orbits: Paranasal sinuses are clear. Orbits are unremarkable. Other: Mastoid air cells and middle ear cavities are clear. IMPRESSION: No acute intracranial hemorrhage or infarct. Stable senescent change. Electronically Signed   By: Fidela Salisbury MD   On: 02/20/2021 23:41   CT Cervical Spine Wo Contrast  Result Date: 02/21/2021 CLINICAL DATA:  Progressive cervical spinal stenosis EXAM: CT CERVICAL SPINE WITHOUT CONTRAST TECHNIQUE: Multidetector CT imaging of the cervical spine was performed without  intravenous contrast. Multiplanar CT image reconstructions were also generated. COMPARISON:  12/30/2020 FINDINGS: Alignment: Normal cervical lordosis. 3-4 mm anterolisthesis of C4 upon C5 is stable, likely degenerative in nature. Skull base and vertebrae: Craniocervical alignment is normal. The atlantodental interval is not widened. No acute fracture of the cervical spine. Soft tissues and spinal canal: No prevertebral fluid or swelling. No visible canal hematoma. Disc levels: There is intervertebral disc space narrowing and endplate remodeling at E9-H3 in keeping with changes of moderate degenerative disc disease. Remaining intervertebral disc heights are preserved. The prevertebral soft tissues are not thickened on sagittal reformats. Review of the axial images demonstrates multilevel uncovertebral and facet arthrosis resulting in moderate to severe bilateral neuroforaminal narrowing at C3-4, right greater than left, moderate to severe left neuroforaminal narrowing at C4-5, moderate left neuroforaminal narrowing at C5-6, and moderate to severe bilateral neuroforaminal narrowing at C6-7, left greater than right. Grade 1 anterolisthesis of C4 upon C5 contributes to mild central canal stenosis at this level. Posterior disc osteophyte complex at C6-7 results in mild central canal stenosis at this level. These changes appears stable since prior examination. Upper chest: Limited by motion artifact, but unremarkable Other: Moderate bilateral carotid bifurcation calcification. IMPRESSION: No acute fracture or listhesis of the cervical spine. Multilevel degenerative disc and degenerative joint disease with grade 1 anterolisthesis of C4 upon C5, stable since prior examination. Electronically Signed   By: Fidela Salisbury MD   On: 02/21/2021 00:55   DG Chest Portable 1 View  Result Date: 02/20/2021 CLINICAL DATA:  Altered mental status, hypotension EXAM: PORTABLE CHEST 1 VIEW COMPARISON:  02/11/2021 FINDINGS: Cardiomegaly.  Atherosclerotic calcification of the aortic knob. Low lung volumes. Pulmonary vascular congestion with bilateral perihilar interstitial opacities. No large pleural fluid collection. No pneumothorax is seen. IMPRESSION: Findings suggest CHF with pulmonary edema. A superimposed infectious process would be difficult to exclude. Electronically Signed   By: Davina Poke D.O.   On: 02/20/2021 20:34      Assessment/Plan  Hypothermia  -rectal temp  of 93.2 on arrival. Unclear etiology. No clear infectious source-no leukocytosis, UA negative, CT head and cervical spine negative. Son mentioned possible abdominal pain- check KUB -blood cultures pending -check TSH -continue Bair hugger  Acute metabolic encephalopthy likely due to hypothermia  -plan as above -also check ammonia level   Thrombocytopenia  -this is chronic although has worsened -noted to have dark stool on exam-check FOBT but son thinks he is on iron supplementation  -check HCV   Anemia -chronic with baseline of 8-9 -dark stool noted on exam -FOBT-he is on Eliquis -had recent EGD on 12/22/2020 with non-bleeding gastric ulcer, mild gastritis and duodenitis  Multilevel cervical spine stenosis -had planned cervical decompression on 6/9 cleared by cardiology -aspirin has already been held for 5 days. There was plans to start holding Eliquis today.   Atrial fibrillation - Hold Eliquis for now with worsening plt and dark stool noted on exam. FOBT pending.   CAD s/p stent -aspirin has been on hold for originally planned cervical spine surgery on 6/9 -continue to hold now with low plt   Hypernatremia -continuous IV fluids with warmer   DVT prophylaxis:SCDs Code Status: Full Family Communication: Plan discussed with son at bedside  disposition Plan: Home with observation Consults called:  Admission status: Observation   Level of care: Progressive  Status is: Observation  The patient remains OBS appropriate and will d/c  before 2 midnights.  Dispo: The patient is from: TBD              Anticipated d/c is to: TBD              Patient currently is not medically stable to d/c.   Difficult to place patient No         Orene Desanctis DO [facility]   If 7PM-7AM, please contact night-coverage [email or web address]   02/21/2021, 1:16 AM

## 2021-02-21 NOTE — Progress Notes (Signed)
This RN & charge RN attempted NG x2. MD aware. Will let night shift RN know

## 2021-02-21 NOTE — Progress Notes (Signed)
Bilateral lower extremity venous duplex has been completed. Preliminary results can be found in CV Proc through chart review.   02/21/21 12:22 PM Carlos Levering RVT

## 2021-02-21 NOTE — Progress Notes (Addendum)
Brief same-day note  Patient is 87 year old male with history of hypertension, coronary disease status post stenting, persistent atrial fibrillation on Eliquis, CKD stage IIIb-IV( follows with Dr Rada Hay kidney,baseline creatinine in the range of 4), confusion/possible early onset dementia, chronic systolic heart failure with ejection fraction of 45 to 50% who lives in a nursing facility was sent here because of worsening mental status, hypothermia and hypernatremia.  He was admitted here and was discharged on 01/05/2019 due to skilled nursing facility when he presented with fall resulting in trauma to the face and chest, rib fracture.  Patient recently developed upper extremity weakness and was being followed by orthopedics.  Had MRI cervical spine which showed moderate to severe spinal narrowing at multiple levels and is being planned for cervical decompression surgery in 6/9.  He was having preop lab work which showed low platelets so he was sent to the emergency department for further evaluation.  There was also report of worsening confusion, frequent falls at rehab.  Usually he is alert and oriented and communicative. On presentation, he was hypothermic, thrombocytopenic( has history of chronic thrombocytopenia), hyponatremic.  CT head was negative.  CT cervical spine did not show any acute changes. Ammonia  level was normal.  TSH was mildly elevated.  FOBT done at the bedside did not show any blood.  Foley was placed in the emergency department for urinary retention. Patient seen and examined at the bedside this morning.  During my evaluation he was obtunded.  Son was at the bedside.  He was hemodynamically stable though. We have changed the IV fluids to D5 due to hypernatremia,we will also continue free water.  Chest x-ray had shown cardiomegaly, pulmonary vascular congestion but he  looks intravascularly depleted.  He was hypothermic and presentation, his temperature has improved with Surveyor, quantity.  UA not impressive for UTI.  He is acute mental status changes could be from hypernatremia.  We will also do MRI of the brain to rule out acute stroke.  Patient is not a candidate for any kind of contrast study due to his kidney function.  We will do an EEG. Noted to have a symmetrical edema of left lower extremity, checking venous Doppler. He is heart rate is well controlled, Eliquis has been held due to thrombocytopenia. I had extensive discussion with son at the bedside.  Patient is a partial code(no intubation).  I have also requested palliative care due to his advanced age, comorbidities and poor prognosis.  We will request neurology evaluation if mental status does not improve in the next 12 to 24 hours.  Update:  CODE BLUE was called at 2: 13 p.m.  I went to the bedside.  He  went to PEA arrest briefly.  ROS.  Patient being transferred to ICU.  Currently he is in 100% nonrebreather.  I talked to PCCM, Dr. Loanne Drilling.  I have reached to palliative care about the urgency of the consult.  I discussed goals of care / CODE STATUS with son, he will talk to his sister and get back to [place].

## 2021-02-21 NOTE — ED Notes (Signed)
Patient transported to MRI 

## 2021-02-22 DIAGNOSIS — Z66 Do not resuscitate: Secondary | ICD-10-CM

## 2021-02-22 DIAGNOSIS — Z515 Encounter for palliative care: Secondary | ICD-10-CM

## 2021-02-22 LAB — URINE CULTURE: Culture: NO GROWTH

## 2021-02-22 LAB — BASIC METABOLIC PANEL
Anion gap: 11 (ref 5–15)
BUN: 78 mg/dL — ABNORMAL HIGH (ref 8–23)
CO2: 20 mmol/L — ABNORMAL LOW (ref 22–32)
Calcium: 10 mg/dL (ref 8.9–10.3)
Chloride: 121 mmol/L — ABNORMAL HIGH (ref 98–111)
Creatinine, Ser: 4.37 mg/dL — ABNORMAL HIGH (ref 0.61–1.24)
GFR, Estimated: 13 mL/min — ABNORMAL LOW (ref >60)
Glucose, Bld: 108 mg/dL — ABNORMAL HIGH (ref 70–99)
Potassium: 4.1 mmol/L (ref 3.5–5.1)
Sodium: 152 mmol/L — ABNORMAL HIGH (ref 135–145)

## 2021-02-22 LAB — CBC WITH DIFFERENTIAL/PLATELET
Abs Immature Granulocytes: 0.08 10*3/uL — ABNORMAL HIGH (ref 0.00–0.07)
Basophils Absolute: 0 10*3/uL (ref 0.0–0.1)
Basophils Relative: 0 %
Eosinophils Absolute: 0.2 10*3/uL (ref 0.0–0.5)
Eosinophils Relative: 3 %
HCT: 29.4 % — ABNORMAL LOW (ref 39.0–52.0)
Hemoglobin: 8.8 g/dL — ABNORMAL LOW (ref 13.0–17.0)
Immature Granulocytes: 1 %
Lymphocytes Relative: 20 %
Lymphs Abs: 1.5 10*3/uL (ref 0.7–4.0)
MCH: 30 pg (ref 26.0–34.0)
MCHC: 29.9 g/dL — ABNORMAL LOW (ref 30.0–36.0)
MCV: 100.3 fL — ABNORMAL HIGH (ref 80.0–100.0)
Monocytes Absolute: 0.6 10*3/uL (ref 0.1–1.0)
Monocytes Relative: 8 %
Neutro Abs: 5.2 10*3/uL (ref 1.7–7.7)
Neutrophils Relative %: 68 %
Platelets: 51 10*3/uL — ABNORMAL LOW (ref 150–400)
RBC: 2.93 MIL/uL — ABNORMAL LOW (ref 4.22–5.81)
RDW: 17.7 % — ABNORMAL HIGH (ref 11.5–15.5)
WBC: 7.6 10*3/uL (ref 4.0–10.5)
nRBC: 0 % (ref 0.0–0.2)

## 2021-02-22 LAB — APTT: aPTT: 45 seconds — ABNORMAL HIGH (ref 24–36)

## 2021-02-22 LAB — VITAMIN B12: Vitamin B-12: 1402 pg/mL — ABNORMAL HIGH (ref 180–914)

## 2021-02-22 LAB — PROTIME-INR
INR: 1.4 — ABNORMAL HIGH (ref 0.8–1.2)
Prothrombin Time: 17.5 seconds — ABNORMAL HIGH (ref 11.4–15.2)

## 2021-02-22 LAB — PATHOLOGIST SMEAR REVIEW

## 2021-02-22 LAB — T4, FREE: Free T4: 0.95 ng/dL (ref 0.61–1.12)

## 2021-02-22 MED ORDER — ACETAMINOPHEN 650 MG RE SUPP: 650.0000 mg | Freq: Four times a day (QID) | RECTAL | Status: DC | PRN

## 2021-02-22 MED ORDER — ACETAMINOPHEN 325 MG PO TABS: 650.0000 mg | ORAL_TABLET | Freq: Four times a day (QID) | ORAL | Status: DC | PRN

## 2021-02-22 MED ORDER — DEXTROSE 5 % IV SOLN: INTRAVENOUS | Status: DC

## 2021-02-22 MED ORDER — BIOTENE DRY MOUTH MT LIQD
15.0000 mL | Freq: Two times a day (BID) | OROMUCOSAL | Status: DC
  Administered 2021-02-22 – 2021-02-23 (×3): 15 mL via TOPICAL

## 2021-02-22 MED ORDER — POLYVINYL ALCOHOL 1.4 % OP SOLN
1.0000 [drp] | Freq: Four times a day (QID) | OPHTHALMIC | Status: DC | PRN
  Filled 2021-02-22: qty 15

## 2021-02-22 MED ORDER — CEFAZOLIN SODIUM-DEXTROSE 2-4 GM/100ML-% IV SOLN
2.0000 g | INTRAVENOUS | Status: DC
  Administered 2021-02-22: 2 g via INTRAVENOUS
  Filled 2021-02-22: qty 100

## 2021-02-22 MED ORDER — PANTOPRAZOLE SODIUM 40 MG PO TBEC
40.0000 mg | DELAYED_RELEASE_TABLET | Freq: Every day | ORAL | Status: DC
  Administered 2021-02-22: 40 mg via ORAL
  Filled 2021-02-22: qty 1

## 2021-02-22 NOTE — Progress Notes (Signed)
SLP Cancellation Note  Patient Details Name: Hyman Crossan MRN: [id number] DOB: 1932/10/10   Cancelled treatment:       Reason Eval/Treat Not Completed: Other (comment) (pt now full comfort, hospice, will sign off)  Kathleen Lime, MS [facility] SLP Acute Rehab Services Office [phone] Pager [phone]   Macario Golds 02/22/2021, 11:03 AM

## 2021-02-22 NOTE — Plan of Care (Signed)
On going plan of care reviewed and implemented

## 2021-02-22 NOTE — Progress Notes (Signed)
Noted pt is awake and alert. Pt is A/O X3. Pt denies discomfort at this time. vss

## 2021-02-22 NOTE — Progress Notes (Signed)
Received request from [facility], Alinda Sierras for family interest in [facility]. Chart reviewed and IPU eligibility is pending at this time. Met with daughter [name] and spoke with son [name] by telephone to acknowledge referral and explain services.  TOC is aware hospital liaison will follow up tomorrow or sooner if room becomes available.   Please do not hesitate to call with any hospice related questions or concerns.   Thank you for the opportunity to participate in this patient's care.  [name], Therapist, sports, [facility] Liaison  [phone]

## 2021-02-22 NOTE — Progress Notes (Signed)
Noted NG  WAS ATTEMPTED TO PASS. NOTED UNABLE TO PASS NG TUBE DUE TO PATIENT HAS HAD SEVERE COUGHING SPELL AND NOSE STARTED BLEEDING

## 2021-02-22 NOTE — Progress Notes (Signed)
Daily Progress Note   Patient Name: [name] [name]       Date: 02/22/2021 DOB: June 29, 1933  Age: 87 y.o. MRN#: [id number] Attending Physician: Shelly Coss, MD Primary Care Physician: Antony Contras, MD Admit Date: 02/20/2021  Reason for Consultation/Follow-up: Establishing goals of care  Subjective: Patient does not respond to voice/touch. Daughter [name] [name] at bedside.   Length of Stay: 1  Current Medications: Scheduled Meds:  . antiseptic oral rinse  15 mL Topical BID  . tamsulosin  0.4 mg Oral Daily    Continuous Infusions:   PRN Meds: acetaminophen **OR** acetaminophen, ondansetron (ZOFRAN) IV, polyvinyl alcohol  Physical Exam Constitutional:      Comments: Does not respond to voice or touch - per RN, intermittently responsive throughout day  Cardiovascular:     Rate and Rhythm: Normal rate. Rhythm irregular.  Pulmonary:     Effort: Pulmonary effort is normal.  Skin:    General: Skin is warm and dry.  Neurological:     Mental Status: He is disoriented.             Vital Signs: BP 138/63 (BP Location: Left Arm)   Pulse (!) 40   Temp (!) 96 F (35.6 C) (Axillary) Comment: RN notified  Resp (!) 21   Wt 92.1 kg   SpO2 99%   BMI 27.54 kg/m  SpO2: SpO2: 99 % O2 Device: O2 Device: Nasal Cannula O2 Flow Rate: O2 Flow Rate (L/min): 2 L/min  Intake/output summary:   Intake/Output Summary (Last 24 hours) at 02/22/2021 1428 Last data filed at 02/21/2021 1700 Gross per 24 hour  Intake 803.69 ml  Output --  Net 803.69 ml   LBM: Last BM Date: 02/21/21 Baseline Weight: Weight: 92.1 kg Most recent weight: Weight: 92.1 kg       Palliative Assessment/Data: PPS 20%      Patient Active Problem List   Diagnosis Date Noted  . Hypothermia 02/21/2021  . Metabolic encephalopathy  71/24/5809  . Thrombocytopenia ([place]) 02/21/2021  . Anemia 02/21/2021  . Cervical stenosis of spine 02/21/2021  . Atrial fibrillation ([place]) 02/21/2021  . Hypernatremia 02/21/2021  . AMS (altered mental status) 02/21/2021  . Respiratory arrest ([place])   . Failure to thrive in adult   . Dehydration   . Goals of care, counseling/discussion   . Hyponatremia 12/21/2020  . Multiple fractures of ribs, bilateral, initial encounter for closed fracture 12/21/2020  . Falls 12/21/2020  . Acute kidney injury superimposed on chronic kidney disease ([place]) 12/21/2020  . Foreign body in stomach 12/21/2020  . Iron deficiency anemia 12/21/2020  . Obesity (BMI 30-39.9) 12/21/2020  . Hypercalcemia 12/21/2020  . CAD (coronary artery disease), stent placed 11/26/11  11/27/2011  . S/P angioplasty with stent, 11/26/11 distal AVGroove LCX into OM3 with BMS (Integrity) and rescue PTCA on OM2 jailed by stent. 11/27/2011  . NSTEMI (non-ST elevated myocardial infarction) most likely as outpatient 11/24/2011  . CKD (chronic kidney disease) stage 3, GFR 30-59 ml/min (HCC) 11/24/2011  . Chest pain 11/23/2011  . HTN (hypertension) 11/23/2011  . A-fib, New onset 11/23/2011  . Acid reflux     Palliative Care Assessment & Plan   HPI: 87 y.o. male  with past medical  history of hypertension, coronary disease status post stenting, persistent atrial fibrillation on Eliquis, CKD stage IIIb-IV( follows with Dr Rada Hay kidney,baseline creatinine in the range of 4), confusion/possibleearly onset dementia, chronic systolic heart failure with ejection fraction of 45 to 50%, anemia, admitted on 02/20/2021 from [facility] with worsening mental status, hypothermia and hypernatremia.   Code blue was called 6/7 at 2:13pm for brief PEA arrest and patient was transferred to ICU on 100% NRB. Palliative care has been consulted to assist with goals of care conversation.  Assessment: Follow up today with daughter [name] [name]. She informs  me of family's decisions to avoid any further aggressive medical interventions. We discuss a shift to full comfort measures and this was described to her in detail. All questions addressed.  [name] [name] does request that no PRN medications be ordered at this time as patient is incredibly sensitive to medicines and appears to be comfortable and resting well without them. She asks that family be called if comfort medications are needed prior to administering.   [name] [name] shares about their decisions to transfer to residential hospice - [facility] and hospice liaisons aware of this.   Recommendations/Plan:  Full comfort measures - all medications/interventions not needed to assure comfort discontinued  Per family request no PRNs added, patient currently appears comfortable and they report he is highly sensitive to medications - they ask they be called prior to administration of narcotics if needed  If any symptoms of pain or shortness of breath - recommend low dose dilaudid, avoid morphine in renal failure  Transfer to residential hospice when bed available  Goals of Care and Additional Recommendations:  Limitations on Scope of Treatment: Full Comfort Care  Code Status:  DNR  Prognosis:   < 2 weeks  Discharge Planning:  Hospice facility  Care plan was discussed with patient's daughter, hospice liaison  Thank you for allowing the Palliative Medicine Team to assist in the care of this patient.   Total Time 30 minutes Prolonged Time Billed  no       Greater than 50%  of this time was spent counseling and coordinating care related to the above assessment and plan.  Juel Burrow, DNP, [facility] Palliative Medicine Team Team Phone # [phone]  Pager [phone]

## 2021-02-22 NOTE — Progress Notes (Addendum)
NAME:  [name] [name], MRN:  [id number], DOB:  Jan 25, 1933, LOS: 1 ADMISSION DATE:  02/20/2021, CONSULTATION DATE:  02/22/21 REFERRING MD:  Tawanna Solo, CHIEF COMPLAINT:   Respiratory arrest  History of Present Illness:  87 y/o M with PMH of HTN, CAD s/p stenting, atrial fibrillation on Eliquis and CKD stage IIIb with baseline creatinine of 4, HFrEF who was recently admitted (12/2020) for fall and discharged to rehab.  He was noted to have worsening upper extremity weakness and and had cervical MRI showing foraminal narrowing at multiple levels and was scheduled for cervical decompression on 6/9.  He had labs checked for pre-op labs and platelets came back in the 50k's, so was sent to the ED.   Once he presented there he was found to be hypothermic and increasingly altered, so was admitted to the family medicine service.   He was noted to have some dark stools, though is on iron supplementation, Hgb 8.6.  On 6/7 he underwent MRI brain and had a brief respiratory arrest with bradycardia. He never lost pulses, as pt is DNI, he was placed on Bipap and transferred to ICU.  Pt's son provides supplemental history, states that early on 6/6 pt was conversational with mild, intermittent confusion.  Notes that he has been diagnosed with PUD and his PPI dose has been decreased recently.  Ammonia level 13.  On arrival to the ICU patient was minimally responsive and was saturating well on Bipap, so was transitioned to nasal cannula.   Pertinent  Medical History  CAD s/p stents 2013 to LCX HTN AF - on Eliquis CKD  GERD Prostate Cancer - s/p radiation seeds 2001  Significant Hospital Events: Including procedures, antibiotic start and stop dates in addition to other pertinent events   . 6/7 admitted to [facility] for AMS, thrombocytopenia, brief resp arrest in MRI, tx to ICU (no loss of pulse)  Interim History / Subjective:  Afebrile  Staph epidermis in aerobic / anaerobic bottles 6/6 blood cultures On 2L O2  I/O - 600  ml UOP, +1.3L in last 24 hours  Objective   Blood pressure 138/63, pulse (!) 40, temperature 98.3 F (36.8 C), temperature source Oral, resp. rate (!) 21, weight 92.1 kg, SpO2 99 %.    FiO2 (%):  [40 %] 40 %   Intake/Output Summary (Last 24 hours) at 02/22/2021 0911 Last data filed at 02/21/2021 1700 Gross per 24 hour  Intake 803.69 ml  Output --  Net 803.69 ml   Filed Weights   02/21/21 1800  Weight: 92.1 kg    General: elderly adult male lying in bed in NAD  HEENT: MM dry with dark dried secretions on tongue, anicteric  Neuro: lethargic, awakens to voice, states no to pain, ok to date/time CV: s1s2 irr irr, no m/r/g PULM: non-labored on vent  GI: soft, bsx4 hypoactive active  Extremities: warm/dry, BLE 1+ pitting pre-tibial edema  Skin: no rashes or lesions  Labs/imaging that I havepersonally reviewed  (right click and "Reselect all SmartList Selections" daily)  CBC - platelets 51, Hgb 8.8 BMP - Na 152, Cl 121, CO2 20, BUN 78, Sr Cr 4.37 (near baseline) PCXR 6/7 - reviewed, small effusion on left  Resolved Hospital Problem list     Assessment & Plan:    Brief Respiratory Arrest and and Bradycardia  While laying flat in MRI -[place] O2 for sats >90% -pulmonary hygiene as able  -follow intermittent CXR -reviewed code status with daughter at bedside, see below   Acute Metabolic  Encephalopathy Only mild confusion until about 6/6. MRI brain and ammonia negative. ABG without hypercarbia.  Suspect multifactorial in setting of hypernatremia, AKI -supportive care -avoid sedating medications  -follow up blood cultures > note staph epidermis  -continue cefepime + vancomycin for now  Staph Epidermis in Single Blood Culture  Both aerobic and anaerobic bottles, likely contaminant  -reviewed with pharmacy, will review with micro lab for further information -continue abx as above for now  -follow cultures, fever curve, WBC trend   Thrombocytopenia, possible GIB -hold eliquis,  ASA -follow up peripheral smear -monitor stool  -continue PPI  Hypernatremia -trend Na+ on BMP    Atrial Fibrillation, CAD s/p stenting, HFpEF On Lasix, Asa, Eliquis, Norvasc, Crestor and Coreg at baseline. Echo 4/7 with EF 55-60% -rate controlled off beta blocker  -tele monitoring  -hold eliquis as above with low platelets   CKD stage III Baseline creatinine of 4 -Trend BMP / urinary output -Replace electrolytes as indicated -Avoid nephrotoxic agents as able, ensure adequate renal perfusion  Dark stools Noted again on arrival to ICU, though small amount.  FOBT negative.  -continue PPI -trend CBC (note FOBT negative)   Best practice (right click and "Reselect all SmartList Selections" daily)  Diet:  NPO Pain/Anxiety/Delirium protocol (if indicated): No VAP protocol (if indicated): Not indicated DVT prophylaxis: SCD GI prophylaxis: PPI Glucose control:  SSI No Central venous access:  N/A Arterial line:  N/A Foley:  Yes, and it is still needed Mobility:  bed rest  PT consulted: N/A Last date of multidisciplinary goals of care discussion: 6/7 son, palliative care and Dr. Loanne Drilling present, has advanced directives stating wants chest compressions, but no intubation. Code Status:  limited Disposition: ICU  6/8 Daughter at bedside, updated on plan of care. Reviewed concept of CPR / intubation and how they are largely interrelated. She indicates understanding.  States her dad would not want life support but wants to discuss the change to DNR with siblings.  Discussed his initial inciting issue is his back which has unfortunately led to him being debilitated in a bed & downstream issues that come with immobility.  We reviewed that he is likely not going to be a long term HD candidate due to physical needs to make it to HD.  In addition, discussed options for care if they elect to transition to more comfort focused care (hospice at home, hospice home and inpatient hospital death).      Critical Care Time: N/A     Noe Gens, MSN, APRN, NP-C, AGACNP-BC [facility] 02/22/2021, 9:11 AM   Please see [email or web address] for pager details.   From 7A-7P if no response, please call [phone] After hours, please call ELink [phone]

## 2021-02-22 NOTE — Progress Notes (Addendum)
PROGRESS NOTE    [name] [name]  VZD:[id number] DOB: Sep 30, 1932 DOA: 02/20/2021 PCP: Antony Contras, MD   Chief Complain:AMS  Brief Narrative: Patient is 87 year old male with history of hypertension, coronary disease status post stenting, persistent atrial fibrillation on Eliquis, CKD stage IIIb-IV( follows with Dr Rada Hay kidney,baseline creatinine in the range of 4), confusion/possible early onset dementia, chronic systolic heart failure with ejection fraction of 45 to 50% who lives in a nursing facility was sent here because of worsening mental status, hypothermia and hypernatremia.  He was admitted here and was discharged on 01/05/2019 due to skilled nursing facility when he presented with fall resulting in trauma to the face and chest, rib fracture.  Patient recently developed upper extremity weakness and was being followed by orthopedics.  Had MRI cervical spine which showed moderate to severe spinal narrowing at multiple levels and is being planned for cervical decompression surgery in 6/9.  He was having preop lab work which showed low platelets so he was sent to the emergency department for further evaluation.  There was also report of worsening confusion, frequent falls at rehab.  Usually he is alert and oriented and communicative. On presentation, he was hypothermic, thrombocytopenic( has history of chronic thrombocytopenia), hyponatremic.  CT head was negative.  CT cervical spine did not show any acute changes.Ammonia  level was normal.  Became briefly pulseless in the afternoon yesterday but quickly resuscitated and was transferred to ICU.  PCCM following.   Assessment & Plan:   Active Problems:   S/P angioplasty with stent, 11/26/11 distal AVGroove LCX into OM3 with BMS ([facility]) and rescue PTCA on OM2 jailed by stent.   Hypothermia   Metabolic encephalopathy   Thrombocytopenia (HCC)   Anemia   Cervical stenosis of spine   Atrial fibrillation (HCC)   Hypernatremia   AMS  (altered mental status)   Respiratory arrest ([place])   Failure to thrive in adult   Dehydration   Goals of care, counseling/discussion   Acute metabolic encephalopathy/confusion: Unclear etiology but most likely secondary to hypernatremia due to dehydration, worsening renal function with uremia.  CT head/MRI of the brain did not show any acute intracranial normalities.  His mental status has slightly improved,but he still looks significantly confused today.  Continue monitoring.  Ammonia level normal.  ABG did not show any hypoxia/hypercarbia on presentation.  Pending vitamin B12.  EEG is pending.  He is alert and oriented at baseline as per son  AKI on CKD stage IV: Baseline creatinine is from 3.8-4.  Follows with Dr. Hollie Salk on, nephrology.  Creatinine trending up.  Continue hypotonic  fluids for now.  Monitor kidney function, avoid nephrotoxins.    Positive blood cultures/sepsis: Presented with hypothermia but no leukocytosis.  Afebrile.  1 blood culture set showing gram-positive cocci in both aerobic and anaerobic bottles, could be contamination.  Urine culture did not show any growth.  We will send repeat cultures.  BCID showing staph epidermidis.  Discussed with ID on phone, it is  Reasonable  to continue vancomycin for now.  Hypernatremia: Most likely secondary to dehydration.  Continue hypotonic fluids.  Monitor sodium level.  Acute hypoxic respiratory  failure: Now requiring 2 L of oxygen per minute.  No signs of pneumonia as per chest x-ray.  Chest x-ray showed features of vascular congestion, pulmonary edema, bilateral revisions but patient is intravascularly depleted.  Thrombocytopenia: He has chronically low platelets level but it has worsened now.  Platelets range in 50 K.  Peripheral smear, Adams 13  activity ordered by PCCM,pending  Chronic normocytic anemia: Baseline hemoglobin in the range of 8-9.  Currently hemoglobin at baseline.  Darker stool was noted on presentation but FOBT was  negative.  He had recent EGD on 12/22/2020 with finding of nonbleeding gastric ulcer, mild gastritis, duodenitis.  Continue Protonix  Multilevel cervical spine stenosis: Follows with neurosurgery and was planned for cervical decompression on 02/23/2021.  I do not think he is a candidate for any kind of surgery.  Permanent A. fib: Not on any rate control medications.  On Eliquis for anticoagulation but currently on hold due to thrombocytopenia  History of coronary artery disease: Status post stenting.  Was taking aspirin at home but has been held due to planned cervical spine surgery on 6/9.  Continue to hold  Hypothermia: Rectal temperature of 93.2 on arrival.  Improved with Bair hugger   TSH mildly elevated, checking free T3 and T4  Urinary retention: Foley placed in the emergency department.  Continue tamsulosin  Goals of care: 87 year old male with multiple comorbidities.  Has been bedbound and unable to ambulate since last several  weeks.  Now has worsening kidney function, poor oral intake. extensive discussion of goals of care done with the family at bedside today.  Palliative care also following.  Daughter is interested for residential hospice/comfort care but wants to have a final discussion with family.  I have alerted palliative care I will also place a consult for social worker.  DNR.        DVT prophylaxis:SCD Code Status: Partial Family Communication: Daughter at bedside Status is: Inpatient  Remains inpatient appropriate because:Inpatient level of care appropriate due to severity of illness   Dispo: The patient is from: Home              Anticipated d/c is to: Not known at present .  Likely residential hospice              Patient currently is not medically stable to d/c.   Difficult to place patient No      Consultants: PCCM, palliative care, nephrology  Procedures: None  Antimicrobials:  Anti-infectives (From admission, onward)   Start     Dose/Rate Route  Frequency Ordered Stop   02/22/21 1600  ceFEPIme (MAXIPIME) 2 g in sodium chloride 0.9 % 100 mL IVPB        2 g 200 mL/hr over 30 Minutes Intravenous Every 24 hours 02/21/21 1534     02/21/21 1600  vancomycin (VANCOREADY) IVPB 1750 mg/350 mL        1,750 mg 175 mL/hr over 120 Minutes Intravenous STAT 02/21/21 1532 02/21/21 1824   02/21/21 1545  ceFEPIme (MAXIPIME) 2 g in sodium chloride 0.9 % 100 mL IVPB        2 g 200 mL/hr over 30 Minutes Intravenous STAT 02/21/21 1526 02/21/21 1613   02/21/21 1544  vancomycin variable dose per unstable renal function (pharmacist dosing)         Does not apply See admin instructions 02/21/21 1544        Subjective:   Patient seen and examined at the bedside this morning.  Hemodynamically stable during my evaluation.  Daughter is at the bedside.  His mental status was reported to have improved last night and he was alert and oriented early this mrng but this he again looks confused, very drowsy.  Opens his eyes when calling his name but does not communicate or obeys commands.   Objective: Vitals:   02/22/21 0300  02/22/21 0400 02/22/21 0500 02/22/21 0600  BP: (!) 146/70 138/72  138/63  Pulse: 68 74 62 (!) 40  Resp: 19 17 18  (!) 21  Temp:  98.3 F (36.8 C)    TempSrc:  Oral    SpO2: 98% 98% 100% 99%  Weight:        Intake/Output Summary (Last 24 hours) at 02/22/2021 8032 Last data filed at 02/21/2021 1700 Gross per 24 hour  Intake 803.69 ml  Output --  Net 803.69 ml   Filed Weights   02/21/21 1800  Weight: 92.1 kg    Examination:  General exam: Chronically ill looking, very deconditioned, debilitated, weak, confused Respiratory system: Diminished air sounds bilaterally but no wheezes or crackles Cardiovascular system: Irregularly irregular rhythm. No JVD, murmurs, rubs, gallops or clicks. No pedal edema. Gastrointestinal system: Abdomen is nondistended, soft and nontender. No organomegaly or masses felt. Normal bowel sounds  heard. Central nervous system: Not Alert and oriented.. Extremities: No edema, no clubbing ,no cyanosis Skin: No rashes, lesions or ulcers,no icterus ,no pallor GU: Foley  Data Reviewed: I have personally reviewed following labs and imaging studies  CBC: Recent Labs  Lab 02/20/21 1126 02/20/21 1931 02/21/21 0439 02/21/21 1537 02/22/21 0248  WBC 5.3 5.2 6.0 6.3 7.6  NEUTROABS 3.2 3.1  --   --  5.2  HGB 9.4* 8.9* 8.6* 8.9* 8.8*  HCT 30.8* 28.6* 27.9* 29.2* 29.4*  MCV 98.1 97.6 97.9 99.7 100.3*  PLT 57* 65* 58* 58* 51*   Basic Metabolic Panel: Recent Labs  Lab 02/20/21 1126 02/20/21 1931 02/21/21 0439 02/21/21 1537 02/22/21 0248  NA 147* 147* 151* 151* 152*  K 4.1 4.0 4.7 4.1 4.1  CL 113* 116* 116* 121* 121*  CO2 23 25 23 24  20*  GLUCOSE 85 157* 66* 93 108*  BUN 69* 73* 74* 73* 78*  CREATININE 3.97* 3.82* 3.85* 4.31* 4.37*  CALCIUM 10.7* 10.2 9.5 9.8 10.0   GFR: Estimated Creatinine Clearance: 13.6 mL/min (A) (by C-G formula based on SCr of 4.37 mg/dL (H)). Liver Function Tests: Recent Labs  Lab 02/20/21 1126 02/20/21 1931 02/21/21 1537  AST 17 18 13*  ALT 21 20 17   ALKPHOS 86 79 83  BILITOT 1.0 0.8 0.8  PROT 6.2* 6.1* 6.0*  ALBUMIN 3.3* 3.4* 3.2*   No results for input(s): LIPASE, AMYLASE in the last 168 hours. Recent Labs  Lab 02/21/21 0519  AMMONIA 13   Coagulation Profile: Recent Labs  Lab 02/20/21 1126 02/20/21 1931 02/21/21 1537 02/22/21 0248  INR 1.5* 1.5* 1.5* 1.4*   Cardiac Enzymes: No results for input(s): CKTOTAL, CKMB, CKMBINDEX, TROPONINI in the last 168 hours. BNP (last 3 results) No results for input(s): PROBNP in the last 8760 hours. HbA1C: No results for input(s): HGBA1C in the last 72 hours. CBG: Recent Labs  Lab 02/20/21 1934 02/21/21 1410 02/21/21 1416 02/21/21 1921  GLUCAP 158* 88 87 102*   Lipid Profile: No results for input(s): CHOL, HDL, LDLCALC, TRIG, CHOLHDL, LDLDIRECT in the last 72 hours. Thyroid Function  Tests: Recent Labs    02/21/21 0439  TSH 6.355*   Anemia Panel: No results for input(s): VITAMINB12, FOLATE, FERRITIN, TIBC, IRON, RETICCTPCT in the last 72 hours. Sepsis Labs: Recent Labs  Lab 02/20/21 1931 02/21/21 1537 02/21/21 1700  LATICACIDVEN 1.4 1.2 0.8    Recent Results (from the past 240 hour(s))  Surgical pcr screen     Status: None   Collection Time: 02/20/21 11:28 AM   Specimen: Nasal Mucosa; Nasal  Swab  Result Value Ref Range Status   MRSA, PCR NEGATIVE NEGATIVE Final   Staphylococcus aureus NEGATIVE NEGATIVE Final    Comment: (NOTE) The Xpert SA Assay (FDA approved for NASAL specimens in patients 30 years of age and older), is one component of a comprehensive surveillance program. It is not intended to diagnose infection nor to guide or monitor treatment. Performed at [facility], [place] [address] [place], [place] [address]   SARS CORONAVIRUS 2 (TAT 6-24 HRS) Nasopharyngeal Nasopharyngeal Swab     Status: None   Collection Time: 02/20/21 11:29 AM   Specimen: Nasopharyngeal Swab  Result Value Ref Range Status   SARS Coronavirus 2 NEGATIVE NEGATIVE Final    Comment: (NOTE) SARS-CoV-2 target nucleic acids are NOT DETECTED.  The SARS-CoV-2 RNA is generally detectable in upper and lower respiratory specimens during the acute phase of infection. Negative results do not preclude SARS-CoV-2 infection, do not rule out co-infections with other pathogens, and should not be used as the sole basis for treatment or other patient management decisions. Negative results must be combined with clinical observations, patient history, and epidemiological information. The expected result is Negative.  Fact Sheet for Patients: SugarRoll.be  Fact Sheet for Healthcare Providers: https://www.woods-mathews.com/  This test is not yet approved or cleared by the [place] FDA and  has been authorized for detection and/or  diagnosis of SARS-CoV-2 by FDA under an Emergency Use Authorization (EUA). This EUA will remain  in effect (meaning this test can be used) for the duration of the COVID-19 declaration under Se ction 564(b)(1) of the Act, 21 U.S.C. section 360bbb-3(b)(1), unless the authorization is terminated or revoked sooner.  Performed at [facility], [place] [address] [place], [place] [address]   Culture, blood (routine x 2)     Status: None (Preliminary result)   Collection Time: 02/20/21  7:31 PM   Specimen: BLOOD  Result Value Ref Range Status   Specimen Description   Final    BLOOD LEFT ANTECUBITAL Performed at [place] [address] [place], [place] [address]    Special Requests   Final    BOTTLES DRAWN AEROBIC AND ANAEROBIC Blood Culture adequate volume Performed at [facility] [address], [place], [place] [address]    Culture  Setup Time   Final    GRAM POSITIVE COCCI IN CLUSTERS IN BOTH AEROBIC AND ANAEROBIC BOTTLES CRITICAL RESULT CALLED TO, READ BACK BY AND VERIFIED WITH: PHARMD J GADHIA AT 2018 02/21/2021 BY L BENFIELD Performed at [facility], [place] [address], [place], [place] [address]    Culture GRAM POSITIVE COCCI  Final   Report Status PENDING  Incomplete  Blood Culture ID Panel (Reflexed)     Status: Abnormal   Collection Time: 02/20/21  7:31 PM  Result Value Ref Range Status   Enterococcus faecalis NOT DETECTED NOT DETECTED Final   Enterococcus Faecium NOT DETECTED NOT DETECTED Final   Listeria monocytogenes NOT DETECTED NOT DETECTED Final   Staphylococcus species DETECTED (A) NOT DETECTED Final    Comment: CRITICAL RESULT CALLED TO, READ BACK BY AND VERIFIED WITH: PHARMD J GADHIA AT 2018 02/21/2021 BY L BENFIELD    Staphylococcus aureus (BCID) NOT DETECTED NOT DETECTED Final   Staphylococcus epidermidis DETECTED (A) NOT DETECTED Final    Comment: CRITICAL RESULT CALLED TO, READ BACK BY AND VERIFIED WITH: PHARMD  J GADHIA AT 2018 02/21/2021 BY L BENFIELD    Staphylococcus lugdunensis NOT DETECTED  NOT DETECTED Final   Streptococcus species NOT DETECTED NOT DETECTED Final   Streptococcus agalactiae NOT DETECTED NOT DETECTED Final   Streptococcus pneumoniae NOT DETECTED NOT DETECTED Final   Streptococcus pyogenes NOT DETECTED NOT DETECTED Final   A.calcoaceticus-baumannii NOT DETECTED NOT DETECTED Final   Bacteroides fragilis NOT DETECTED NOT DETECTED Final   Enterobacterales NOT DETECTED NOT DETECTED Final   Enterobacter cloacae complex NOT DETECTED NOT DETECTED Final   Escherichia coli NOT DETECTED NOT DETECTED Final   Klebsiella aerogenes NOT DETECTED NOT DETECTED Final   Klebsiella oxytoca NOT DETECTED NOT DETECTED Final   Klebsiella pneumoniae NOT DETECTED NOT DETECTED Final   Proteus species NOT DETECTED NOT DETECTED Final   Salmonella species NOT DETECTED NOT DETECTED Final   Serratia marcescens NOT DETECTED NOT DETECTED Final   Haemophilus influenzae NOT DETECTED NOT DETECTED Final   Neisseria meningitidis NOT DETECTED NOT DETECTED Final   Pseudomonas aeruginosa NOT DETECTED NOT DETECTED Final   Stenotrophomonas maltophilia NOT DETECTED NOT DETECTED Final   Candida albicans NOT DETECTED NOT DETECTED Final   Candida auris NOT DETECTED NOT DETECTED Final   Candida glabrata NOT DETECTED NOT DETECTED Final   Candida krusei NOT DETECTED NOT DETECTED Final   Candida parapsilosis NOT DETECTED NOT DETECTED Final   Candida tropicalis NOT DETECTED NOT DETECTED Final   Cryptococcus neoformans/gattii NOT DETECTED NOT DETECTED Final   Methicillin resistance mecA/C NOT DETECTED NOT DETECTED Final    Comment: Performed at [facility], [address] [place], [place] [address]  Resp Panel by RT-PCR (Flu A&B, Covid) Nasopharyngeal Swab     Status: None   Collection Time: 02/20/21  8:19 PM   Specimen: Nasopharyngeal Swab; Nasopharyngeal(NP) swabs in vial transport medium  Result Value Ref Range  Status   SARS Coronavirus 2 by RT PCR NEGATIVE NEGATIVE Final    Comment: (NOTE) SARS-CoV-2 target nucleic acids are NOT DETECTED.  The SARS-CoV-2 RNA is generally detectable in upper respiratory specimens during the acute phase of infection. The lowest concentration of SARS-CoV-2 viral copies this assay can detect is 138 copies/mL. A negative result does not preclude SARS-Cov-2 infection and should not be used as the sole basis for treatment or other patient management decisions. A negative result may occur with  improper specimen collection/handling, submission of specimen other than nasopharyngeal swab, presence of viral mutation(s) within the areas targeted by this assay, and inadequate number of viral copies(<138 copies/mL). A negative result must be combined with clinical observations, patient history, and epidemiological information. The expected result is Negative.  Fact Sheet for Patients:  EntrepreneurPulse.com.au  Fact Sheet for Healthcare Providers:  IncredibleEmployment.be  This test is no t yet approved or cleared by the [place] FDA and  has been authorized for detection and/or diagnosis of SARS-CoV-2 by FDA under an Emergency Use Authorization (EUA). This EUA will remain  in effect (meaning this test can be used) for the duration of the COVID-19 declaration under Section 564(b)(1) of the Act, 21 U.S.C.section 360bbb-3(b)(1), unless the authorization is terminated  or revoked sooner.       Influenza A by PCR NEGATIVE NEGATIVE Final   Influenza B by PCR NEGATIVE NEGATIVE Final    Comment: (NOTE) The Xpert Xpress SARS-CoV-2/FLU/RSV plus assay is intended as an aid in the diagnosis of influenza from Nasopharyngeal swab specimens and should not be used as a sole basis for treatment. Nasal washings and aspirates are unacceptable for Xpert Xpress SARS-CoV-2/FLU/RSV testing.  Fact Sheet for  Patients: EntrepreneurPulse.com.au  Fact Sheet for Healthcare Providers: IncredibleEmployment.be  This test is not yet approved or cleared by the [place] FDA and has been authorized for detection and/or diagnosis of SARS-CoV-2 by FDA under an Emergency Use Authorization (EUA). This EUA will remain in effect (meaning this test can be used) for the duration of the COVID-19 declaration under Section 564(b)(1) of the Act, 21 U.S.C. section 360bbb-3(b)(1), unless the authorization is terminated or revoked.  Performed at [facility], [place] [address], [place], [place] [address]   Urine culture     Status: None   Collection Time: 02/20/21  8:30 PM   Specimen: Urine, Clean Catch  Result Value Ref Range Status   Specimen Description   Final    URINE, CLEAN CATCH Performed at [facility], [place] [address], [place], [place] [address]    Special Requests   Final    NONE Performed at [facility], [place] [address] [place], [place] [address]    Culture   Final    NO GROWTH Performed at [facility], [place] [address] [place], [place] [address]    Report Status 02/22/2021 FINAL  Final  MRSA PCR Screening     Status: None   Collection Time: 02/21/21  2:28 PM   Specimen: Nasopharyngeal  Result Value Ref Range Status   MRSA by PCR NEGATIVE NEGATIVE Final    Comment:        The GeneXpert MRSA Assay (FDA approved for NASAL specimens only), is one component of a comprehensive MRSA colonization surveillance program. It is not intended to diagnose MRSA infection nor to guide or monitor treatment for MRSA infections. Performed at [facility], [place] [address] [place], [place] [address]          Radiology Studies: DG Abd 1 View  Result Date: 02/21/2021 CLINICAL DATA:  Constipation EXAM: ABDOMEN - 1 VIEW COMPARISON:  None. FINDINGS: Normal abdominal gas pattern.  Moderate stool seen throughout the colon. No gross free intraperitoneal gas. Vascular calcifications are seen within the abdominal aorta and pelvis. No organomegaly. Brachytherapy seeds overlie the expected prostate gland. IMPRESSION: Nonobstructive bowel gas pattern.  Moderate stool. Electronically Signed   By: Fidela Salisbury MD   On: 02/21/2021 02:41   CT Head Wo Contrast  Result Date: 02/20/2021 CLINICAL DATA:  Thrombocytopenia, altered mental status EXAM: CT HEAD WITHOUT CONTRAST TECHNIQUE: Contiguous axial images were obtained from the base of the skull through the vertex without intravenous contrast. COMPARISON:  02/11/2021 FINDINGS: Brain: Normal anatomic configuration. Parenchymal volume loss is commensurate with the patient's age. Mild periventricular white matter changes are present likely reflecting the sequela of small vessel ischemia. No abnormal intra or extra-axial mass lesion or fluid collection. No abnormal mass effect or midline shift. No evidence of acute intracranial hemorrhage or infarct. Ventricular size is normal. Cerebellum unremarkable. Vascular: No asymmetric hyperdense vasculature at the skull base. Skull: Intact Sinuses/Orbits: Paranasal sinuses are clear. Orbits are unremarkable. Other: Mastoid air cells and middle ear cavities are clear. IMPRESSION: No acute intracranial hemorrhage or infarct. Stable senescent change. Electronically Signed   By: Fidela Salisbury MD   On: 02/20/2021 23:41   CT Cervical Spine Wo Contrast  Result Date: 02/21/2021 CLINICAL DATA:  Progressive cervical spinal stenosis EXAM: CT CERVICAL SPINE WITHOUT CONTRAST TECHNIQUE: Multidetector CT imaging of the cervical spine was performed without intravenous contrast. Multiplanar CT image reconstructions were also generated. COMPARISON:  12/30/2020 FINDINGS: Alignment: Normal cervical lordosis. 3-4 mm anterolisthesis  of C4 upon C5 is stable, likely degenerative in nature. Skull base and vertebrae: Craniocervical  alignment is normal. The atlantodental interval is not widened. No acute fracture of the cervical spine. Soft tissues and spinal canal: No prevertebral fluid or swelling. No visible canal hematoma. Disc levels: There is intervertebral disc space narrowing and endplate remodeling at Q0-G8 in keeping with changes of moderate degenerative disc disease. Remaining intervertebral disc heights are preserved. The prevertebral soft tissues are not thickened on sagittal reformats. Review of the axial images demonstrates multilevel uncovertebral and facet arthrosis resulting in moderate to severe bilateral neuroforaminal narrowing at C3-4, right greater than left, moderate to severe left neuroforaminal narrowing at C4-5, moderate left neuroforaminal narrowing at C5-6, and moderate to severe bilateral neuroforaminal narrowing at C6-7, left greater than right. Grade 1 anterolisthesis of C4 upon C5 contributes to mild central canal stenosis at this level. Posterior disc osteophyte complex at C6-7 results in mild central canal stenosis at this level. These changes appears stable since prior examination. Upper chest: Limited by motion artifact, but unremarkable Other: Moderate bilateral carotid bifurcation calcification. IMPRESSION: No acute fracture or listhesis of the cervical spine. Multilevel degenerative disc and degenerative joint disease with grade 1 anterolisthesis of C4 upon C5, stable since prior examination. Electronically Signed   By: Fidela Salisbury MD   On: 02/21/2021 00:55   MR BRAIN WO CONTRAST  Result Date: 02/21/2021 CLINICAL DATA:  Delirium EXAM: MRI HEAD WITHOUT CONTRAST TECHNIQUE: Multiplanar, multiecho pulse sequences of the brain and surrounding structures were obtained without intravenous contrast. COMPARISON:  None. FINDINGS: Significant motion artifact is present. Findings below are within this limitation. Brain: There is no acute infarction. There is no intracranial mass, mass effect, or edema. There is  no hydrocephalus or extra-axial fluid collection. Patchy T2 hyperintensity in the supratentorial white matter may reflect chronic microvascular ischemic changes. Prominence of the ventricles and sulci reflects generalized parenchymal volume loss. Vascular: Major vessel flow voids at the skull base are preserved. Skull and upper cervical spine: Normal marrow signal is preserved. Sinuses/Orbits: Paranasal sinuses are aerated. Bilateral lens replacements. Other: Sella is unremarkable.  Mastoid air cells are clear. IMPRESSION: Significantly motion degraded study. No acute infarction. Electronically Signed   By: Macy Mis M.D.   On: 02/21/2021 14:02   DG CHEST PORT 1 VIEW  Result Date: 02/21/2021 CLINICAL DATA:  Hypoxia. EXAM: PORTABLE CHEST 1 VIEW COMPARISON:  Radiograph yesterday.  Most recent CT 12/21/2020 FINDINGS: Similar low lung volumes to prior exam. Stable cardiomegaly. Unchanged mediastinal contours with aortic atherosclerosis. Small left pleural effusion with adjacent basilar airspace disease or atelectasis. Small right pleural effusion. Improved pulmonary edema. Persistent vascular congestion. No pneumothorax. IMPRESSION: 1. Improving pulmonary edema. Persistent vascular congestion and cardiomegaly. 2. Bilateral pleural effusions with adjacent basilar airspace disease/atelectasis, left greater than right. Electronically Signed   By: Keith Rake M.D.   On: 02/21/2021 15:35   DG Chest Portable 1 View  Result Date: 02/20/2021 CLINICAL DATA:  Altered mental status, hypotension EXAM: PORTABLE CHEST 1 VIEW COMPARISON:  02/11/2021 FINDINGS: Cardiomegaly. Atherosclerotic calcification of the aortic knob. Low lung volumes. Pulmonary vascular congestion with bilateral perihilar interstitial opacities. No large pleural fluid collection. No pneumothorax is seen. IMPRESSION: Findings suggest CHF with pulmonary edema. A superimposed infectious process would be difficult to exclude. Electronically Signed    By: Davina Poke D.O.   On: 02/20/2021 20:34   VAS [place] LOWER EXTREMITY VENOUS (DVT)  Result Date: 02/21/2021  Lower Venous DVT Study Patient Name:  Jaeger Busick  Date of Exam:   02/21/2021 Medical Rec #: [id number]      Accession #:    [id number] Date of Birth: 1933-07-16     Patient Gender: M Patient Age:   87Y Exam Location:  [facility] Procedure:      VAS [place] LOWER EXTREMITY VENOUS (DVT) Referring Phys: [id number] Shyheem Whitham --------------------------------------------------------------------------------  Indications: Swelling.  Risk Factors: None identified. Limitations: Body habitus, poor ultrasound/tissue interface and patient positioning, patient immobility. Comparison Study: No prior studies. Performing Technologist: Oliver Hum RVT  Examination Guidelines: A complete evaluation includes B-mode imaging, spectral Doppler, color Doppler, and power Doppler as needed of all accessible portions of each vessel. Bilateral testing is considered an integral part of a complete examination. Limited examinations for reoccurring indications may be performed as noted. The reflux portion of the exam is performed with the patient in reverse Trendelenburg.  +---------+---------------+---------+-----------+----------+-------------------+ RIGHT    CompressibilityPhasicitySpontaneityPropertiesThrombus Aging      +---------+---------------+---------+-----------+----------+-------------------+ CFV      Full           Yes      Yes                                      +---------+---------------+---------+-----------+----------+-------------------+ SFJ      Full                                                             +---------+---------------+---------+-----------+----------+-------------------+ FV Prox  Full                                                             +---------+---------------+---------+-----------+----------+-------------------+ FV Mid   Full                                                              +---------+---------------+---------+-----------+----------+-------------------+ FV DistalFull                                                             +---------+---------------+---------+-----------+----------+-------------------+ PFV      Full                                                             +---------+---------------+---------+-----------+----------+-------------------+ POP      Full           Yes      Yes                                      +---------+---------------+---------+-----------+----------+-------------------+  PTV      Full                                                             +---------+---------------+---------+-----------+----------+-------------------+ PERO                                                  Not well visualized +---------+---------------+---------+-----------+----------+-------------------+   +---------+---------------+---------+-----------+----------+-------------------+ LEFT     CompressibilityPhasicitySpontaneityPropertiesThrombus Aging      +---------+---------------+---------+-----------+----------+-------------------+ CFV      Full           Yes      Yes                                      +---------+---------------+---------+-----------+----------+-------------------+ SFJ      Full                                                             +---------+---------------+---------+-----------+----------+-------------------+ FV Prox  Full                                                             +---------+---------------+---------+-----------+----------+-------------------+ FV Mid   Full                                                             +---------+---------------+---------+-----------+----------+-------------------+ FV DistalFull                                                              +---------+---------------+---------+-----------+----------+-------------------+ PFV      Full                                                             +---------+---------------+---------+-----------+----------+-------------------+ POP      Full           Yes      Yes                                      +---------+---------------+---------+-----------+----------+-------------------+ PTV      Full                                                             +---------+---------------+---------+-----------+----------+-------------------+  PERO                                                  Not well visualized +---------+---------------+---------+-----------+----------+-------------------+     Summary: RIGHT: - There is no evidence of deep vein thrombosis in the lower extremity. However, portions of this examination were limited- see technologist comments above.  - No cystic structure found in the popliteal fossa.  LEFT: - There is no evidence of deep vein thrombosis in the lower extremity. However, portions of this examination were limited- see technologist comments above.  - No cystic structure found in the popliteal fossa.  *See table(s) above for measurements and observations. Electronically signed by Ruta Hinds MD on 02/21/2021 at 4:28:57 PM.    Final         Scheduled Meds: . chlorhexidine  15 mL Mouth Rinse BID  . Chlorhexidine Gluconate Cloth  6 each Topical Daily  . free water  150 mL Per Tube Q8H  . mouth rinse  15 mL Mouth Rinse q12n4p  . pantoprazole (PROTONIX) IV  40 mg Intravenous Q24H  . rosuvastatin  5 mg Oral Q M,W,F-1800  . tamsulosin  0.4 mg Oral Daily  . vancomycin variable dose per unstable renal function (pharmacist dosing)   Does not apply See admin instructions   Continuous Infusions: . ceFEPime (MAXIPIME) IV       LOS: 1 day    Time spent:35 mins. More than 50% of that time was spent in counseling and/or coordination of  care.      Shelly Coss, MD [facility] P6/04/2021, 7:38 AM

## 2021-02-22 NOTE — TOC Progression Note (Signed)
Transition of Care [facility]) - Progression Note    Patient Details  Name: [name] [name] MRN: [id number] Date of Birth: Nov 06, 1932  Transition of Care [facility]) CM/SW Contact  Eustacia Urbanek, Marjie Skiff, RN Phone Number: 02/22/2021, 11:46 AM  Clinical Narrative:    [facility] consult for residential hospice. Spoke with son [name] [name] via phone to offer choice for residential hospice. [facility]. [facility] planner contacted for referral.   Expected Discharge Plan: [place] Barriers to Discharge: No Barriers Identified  Expected Discharge Plan and Services Expected Discharge Plan: [place]   Discharge Planning Services: CM Consult Post Acute Care Choice: [place] Living arrangements for the past 2 months: [place]                   Social Determinants of Health (SDOH) Interventions    Readmission Risk Interventions No flowsheet data found.

## 2021-02-22 NOTE — Progress Notes (Addendum)
Spoke with daughter at bedside. She confirms the family wants to transition to DNR with hospice care.  PCCM will sign off, please call back if new needs arise.    Noe Gens, MSN, APRN, NP-C, AGACNP-BC [facility] 02/22/2021, 10:31 AM   Please see [email or web address] for pager details.   From 7A-7P if no response, please call [phone] After hours, please call ELink [phone]

## 2021-02-22 NOTE — Progress Notes (Signed)
CH received PG from RN to offer support to pt.'s dtr. at bedside.  Dtr. Marianna Fuss shared that pt. has suffered several falls and that although he has expressed a desire to 'fight', 'his body just can't take it anymore.'  Dtr. tearfully expressed the difficulty of decision today to move to hospice care for pt.  Pt. seemed able to respond to dtr.'s voice but it was unclear if he recognized her.   She requested prayer for herself and for pt. After prayer, dtr. expressed desire to have additional family visit once pt.'s status is formally changed to 'comfort care' and shared this request w/RN.  Dtr. is aware of chaplains' availability if needed.  Lindaann Pascal PRN Chaplain Pager: [phone]

## 2021-02-22 NOTE — Care Plan (Signed)
Nephrology brief note: Received a consult this morning. I discussed with PCCM and primary team with updates that the patient is now DNR and plan for hospice care. No need for nephrology consult.  Please call back if needed.

## 2021-02-23 ENCOUNTER — Encounter (HOSPITAL_COMMUNITY): Admission: RE | Payer: Self-pay | Source: Home / Self Care

## 2021-02-23 ENCOUNTER — Ambulatory Visit (HOSPITAL_COMMUNITY): Admission: RE | Admit: 2021-02-23 | Payer: Medicare Other | Source: Home / Self Care | Admitting: Orthopedic Surgery

## 2021-02-23 LAB — T3, FREE: T3, Free: 1.6 pg/mL — ABNORMAL LOW (ref 2.0–4.4)

## 2021-02-23 LAB — CULTURE, BLOOD (ROUTINE X 2): Special Requests: ADEQUATE

## 2021-02-23 SURGERY — ANTERIOR CERVICAL DECOMPRESSION/DISCECTOMY FUSION 1 LEVEL
Anesthesia: General

## 2021-02-23 NOTE — Progress Notes (Signed)
[place] visited pt. as follow-up from visit w/pt.'s dtr. yesterday and at dtr.'s request after encountering her earlier today in lobby; pt. scheduled for discharge to residential hospice facility later today.  Pt. lying in bed somewhat agitated but not appearing distressed; Dtr. [name] and her husband Eddie Dibbles at bedside.  [name] requested prayer for peace for pt. and for strength for herself and her siblings.  CH offered prayer for sense of divine presence and peace.  No further needs at this time, but chaplains remain available as needed.  Lindaann Pascal PRN Chaplain Pager: [phone]

## 2021-02-23 NOTE — Plan of Care (Signed)
Pt ready for d/c to [facility].  Family at bedside, all questions answered.  Chaplain with family for emotional support.  RN will continue to carefully monitor pt until transportation arrives.

## 2021-02-23 NOTE — Progress Notes (Signed)
Manufacturing engineer [facility])   Referral received for EOL care at [facility].   Appropriateness has been determined by Hospice MD.   [facility] does have a bed available today.    Please leave IV in place   RN staff, you may call report at any time to [phone], room is assigned when report is called.   Please send completed DNR with patient.   Updated attending and [facility] manager via [place].

## 2021-02-23 NOTE — Discharge Summary (Signed)
Physician Discharge Summary  [name] [name] TGG:[id number] DOB: [birth year] DOA: 02/20/2021  PCP: Antony Contras, MD  Admit date: 02/20/2021 Discharge date: 02/23/2021  Admitted From: Home Disposition:  [facility]  Recommendations for Outpatient Follow-up:  Follow up with hospice services  Home Health: None  Equipment/Devices: None   Discharge Condition: Stable  CODE STATUS: DNR   Brief/Interim Summary: Patient is 87 year old male with history of hypertension, coronary disease status post stenting, persistent atrial fibrillation on Eliquis, CKD stage IIIb-IV. He was admitted here and was discharged on 01/05/2019 due to skilled nursing facility when he presented with fall resulting in trauma to the face and chest, rib fracture.There was also report of worsening confusion, frequent falls at rehab. He was admitted for this confusion. He became briefly pulseless on 6/7. A CODE was called, and he quickly resuscitated and was transferred to ICU. Family has decided that the patient will be DNR and they are looking for residential hospice. He is currently comfort care measures only. He has a bed at [facility]. He will be discharged to [facility] today.   Discharge Diagnoses: Acute metabolic encephalopathy/confusion     - Unclear etiology but most likely secondary to hypernatremia due to dehydration, worsening renal function with uremia.       - CT head/MRI of the brain did not show any acute intracranial normalities.     - Ammonia level normal.       - ABG did not show any hypoxia/hypercarbia on presentation.     - family has elected comfort care measures only at this time   AKI on CKD stage IV     - Baseline creatinine is from 3.8-4.       - Follows with Dr. Hollie Salk on, nephrology     - family has elected comfort care measures only at this time   Positive blood cultures/sepsis     - Presented with hypothermia but no leukocytosis.     - 1 blood culture set showing gram-positive cocci in both  aerobic and anaerobic bottles, could be contamination.       - Urine culture did not show any growth.     - BCID showing staph epidermidis; case was discussed with ID on phone; determined reasonable to continue vancomycin at that time.     - family has elected comfort care measures only at this time   Hypernatremia     - Most likely secondary to dehydration.     - family has elected comfort care measures only at this time   Acute hypoxic respiratory  failure     - No signs of pneumonia as per chest x-ray.       - Chest x-ray showed features of vascular congestion, pulmonary edema, bilateral revisions but patient is intravascularly depleted.     - family has elected comfort care measures only at this time   Thrombocytopenia Chronic normocytic anemia     - He has chronically low platelets level but it has worsened now.     - Platelets range in 50 K.  Peripheral smear, Adams 13 activity ordered by PCCM     - Baseline hemoglobin in the range of 8-9.     - He had recent EGD on 12/22/2020 with finding of nonbleeding gastric ulcer, mild gastritis, duodenitis.     - family has elected comfort care measures only at this time   Multilevel cervical spine stenosis:     - Follows with neurosurgery and was planned for cervical decompression  on 02/23/2021.     - family has elected comfort care measures only at this time   Permanent A. fib     - Not on any rate control medications.       - On Eliquis for anticoagulation but currently on hold due to thrombocytopenia     - family has elected comfort care measures only at this time   History of coronary artery disease s/p stenting     - Was taking aspirin at home but has been held due to planned cervical spine surgery on 6/9.            - family has elected comfort care measures only at this time   Hypothermia     - Rectal temperature of 93.2 on arrival.       - Improved with Bair hugger     - family has elected comfort care measures only at this  time   Urinary retention     - Foley placed in the emergency department.     - family has elected comfort care measures only at this time   Discharge Instructions   Allergies as of 02/23/2021       Reactions   Hydrochlorothiazide Other (See Comments)   Exacerbates gout   Morphine And Related Other (See Comments)   Hallucinations        Medication List     STOP taking these medications    acetaminophen 325 MG tablet Commonly known as: TYLENOL   allopurinol 100 MG tablet Commonly known as: ZYLOPRIM   amLODipine 5 MG tablet Commonly known as: NORVASC   apixaban 2.5 MG Tabs tablet Commonly known as: ELIQUIS   aspirin EC 81 MG tablet   bimatoprost 0.03 % ophthalmic solution Commonly known as: LUMIGAN   Biofreeze 4 % Gel Generic drug: Menthol (Topical Analgesic)   carvedilol 25 MG tablet Commonly known as: COREG   Dextran 70-Hypromellose (PF) 0.1-0.3 % Soln   docusate sodium 100 MG capsule Commonly known as: COLACE   ferrous gluconate 324 MG tablet Commonly known as: FERGON   Fish Oil 1000 MG Caps   furosemide 20 MG tablet Commonly known as: LASIX   Lidocaine 4 % Ptch   lidocaine 5 % Commonly known as: LIDODERM   Nyamyc powder Generic drug: nystatin   pantoprazole 40 MG tablet Commonly known as: PROTONIX   rosuvastatin 5 MG tablet Commonly known as: CRESTOR   Systane Ultra PF 0.4-0.3 % Soln Generic drug: Polyethyl Glyc-Propyl Glyc PF   vitamin B-12 1000 MCG tablet Commonly known as: CYANOCOBALAMIN        Allergies  Allergen Reactions   Hydrochlorothiazide Other (See Comments)    Exacerbates gout   Morphine And Related Other (See Comments)    Hallucinations    Consultations: Nephrology PCCM  Procedures/Studies: DG Abd 1 View  Result Date: 02/21/2021 CLINICAL DATA:  Constipation EXAM: ABDOMEN - 1 VIEW COMPARISON:  None. FINDINGS: Normal abdominal gas pattern. Moderate stool seen throughout the colon. No gross free  intraperitoneal gas. Vascular calcifications are seen within the abdominal aorta and pelvis. No organomegaly. Brachytherapy seeds overlie the expected prostate gland. IMPRESSION: Nonobstructive bowel gas pattern.  Moderate stool. Electronically Signed   By: Fidela Salisbury MD   On: 02/21/2021 02:41   CT Head Wo Contrast  Result Date: 02/20/2021 CLINICAL DATA:  Thrombocytopenia, altered mental status EXAM: CT HEAD WITHOUT CONTRAST TECHNIQUE: Contiguous axial images were obtained from the base of the skull through the vertex without intravenous contrast.  COMPARISON:  02/11/2021 FINDINGS: Brain: Normal anatomic configuration. Parenchymal volume loss is commensurate with the patient's age. Mild periventricular white matter changes are present likely reflecting the sequela of small vessel ischemia. No abnormal intra or extra-axial mass lesion or fluid collection. No abnormal mass effect or midline shift. No evidence of acute intracranial hemorrhage or infarct. Ventricular size is normal. Cerebellum unremarkable. Vascular: No asymmetric hyperdense vasculature at the skull base. Skull: Intact Sinuses/Orbits: Paranasal sinuses are clear. Orbits are unremarkable. Other: Mastoid air cells and middle ear cavities are clear. IMPRESSION: No acute intracranial hemorrhage or infarct. Stable senescent change. Electronically Signed   By: Fidela Salisbury MD   On: 02/20/2021 23:41   CT HEAD WO CONTRAST  Result Date: 02/11/2021 CLINICAL DATA:  Increased confusion. EXAM: CT HEAD WITHOUT CONTRAST TECHNIQUE: Contiguous axial images were obtained from the base of the skull through the vertex without intravenous contrast. COMPARISON:  December 21, 2020 FINDINGS: Brain: No evidence of acute infarction, hemorrhage, hydrocephalus, extra-axial collection or mass lesion/mass effect. Advanced deep white matter microangiopathy and moderate brain parenchymal volume loss. Vascular: No hyperdense vessel or unexpected calcification. Skull: Normal.  Negative for fracture or focal lesion. Sinuses/Orbits: No acute finding. Other: None. IMPRESSION: 1. No acute intracranial abnormality. 2. Atrophy, chronic microvascular disease. Electronically Signed   By: Fidela Salisbury M.D.   On: 02/11/2021 14:18   CT Cervical Spine Wo Contrast  Result Date: 02/21/2021 CLINICAL DATA:  Progressive cervical spinal stenosis EXAM: CT CERVICAL SPINE WITHOUT CONTRAST TECHNIQUE: Multidetector CT imaging of the cervical spine was performed without intravenous contrast. Multiplanar CT image reconstructions were also generated. COMPARISON:  12/30/2020 FINDINGS: Alignment: Normal cervical lordosis. 3-4 mm anterolisthesis of C4 upon C5 is stable, likely degenerative in nature. Skull base and vertebrae: Craniocervical alignment is normal. The atlantodental interval is not widened. No acute fracture of the cervical spine. Soft tissues and spinal canal: No prevertebral fluid or swelling. No visible canal hematoma. Disc levels: There is intervertebral disc space narrowing and endplate remodeling at B7-S2 in keeping with changes of moderate degenerative disc disease. Remaining intervertebral disc heights are preserved. The prevertebral soft tissues are not thickened on sagittal reformats. Review of the axial images demonstrates multilevel uncovertebral and facet arthrosis resulting in moderate to severe bilateral neuroforaminal narrowing at C3-4, right greater than left, moderate to severe left neuroforaminal narrowing at C4-5, moderate left neuroforaminal narrowing at C5-6, and moderate to severe bilateral neuroforaminal narrowing at C6-7, left greater than right. Grade 1 anterolisthesis of C4 upon C5 contributes to mild central canal stenosis at this level. Posterior disc osteophyte complex at C6-7 results in mild central canal stenosis at this level. These changes appears stable since prior examination. Upper chest: Limited by motion artifact, but unremarkable Other: Moderate bilateral  carotid bifurcation calcification. IMPRESSION: No acute fracture or listhesis of the cervical spine. Multilevel degenerative disc and degenerative joint disease with grade 1 anterolisthesis of C4 upon C5, stable since prior examination. Electronically Signed   By: Fidela Salisbury MD   On: 02/21/2021 00:55   MR BRAIN WO CONTRAST  Result Date: 02/21/2021 CLINICAL DATA:  Delirium EXAM: MRI HEAD WITHOUT CONTRAST TECHNIQUE: Multiplanar, multiecho pulse sequences of the brain and surrounding structures were obtained without intravenous contrast. COMPARISON:  None. FINDINGS: Significant motion artifact is present. Findings below are within this limitation. Brain: There is no acute infarction. There is no intracranial mass, mass effect, or edema. There is no hydrocephalus or extra-axial fluid collection. Patchy T2 hyperintensity in the supratentorial white matter may  reflect chronic microvascular ischemic changes. Prominence of the ventricles and sulci reflects generalized parenchymal volume loss. Vascular: Major vessel flow voids at the skull base are preserved. Skull and upper cervical spine: Normal marrow signal is preserved. Sinuses/Orbits: Paranasal sinuses are aerated. Bilateral lens replacements. Other: Sella is unremarkable.  Mastoid air cells are clear. IMPRESSION: Significantly motion degraded study. No acute infarction. Electronically Signed   By: Macy Mis M.D.   On: 02/21/2021 14:02   MR CERVICAL SPINE WO CONTRAST  Result Date: 01/25/2021 CLINICAL DATA:  Neck pain, prior falls. EXAM: MRI CERVICAL SPINE WITHOUT CONTRAST TECHNIQUE: Multiplanar, multisequence MR imaging of the cervical spine was performed. No intravenous contrast was administered. COMPARISON:  12/30/2020 and prior. FINDINGS: Alignment: Straightening of lordosis. Stepwise grade 1 anterolisthesis at the C4-6 levels. Trace C6-7 retrolisthesis. Vertebrae: Minimal Modic type 2 endplate degenerative changes. No fracture or aggressive osseous  lesion. Cord: T2 hyperintense cord signal spanning the C2-C4 levels. Posterior Fossa, vertebral arteries: Negative. Disc levels: Multilevel desiccation. C2-3: Disc osteophyte complex with central bulge partially effacing the ventral CSF containing spaces. Uncovertebral and facet degenerative spurring. Mild spinal canal and bilateral neural foraminal narrowing. C3-4: Disc osteophyte complex abutting/flattening the ventral cord with uncovertebral and facet hypertrophy. Ligamentum flavum thickening. Query small right foraminal protrusion. Severe spinal canal and moderate bilateral neural foraminal narrowing. C4-5: Disc osteophyte complex with uncovertebral and facet hypertrophy. Prominent ligamentum flavum. Mild spinal canal, mild right and moderate left neural foraminal narrowing. C5-6: Uncovertebral and facet degenerative spurring. No significant disc bulge. Mild spinal canal and bilateral neural foraminal narrowing. C6-7: Disc osteophyte complex with uncovertebral and facet hypertrophy. Mild-to-moderate spinal canal, moderate right and severe left neural foraminal narrowing. C7-T1: Uncovertebral and facet hypertrophy. Patent spinal canal and left neural foramen. Mild right neural foraminal narrowing. Paraspinal tissues: Negative. IMPRESSION: Severe spinal canal and moderate bilateral neural foraminal narrowing at the C3-4 level. Moderate spinal canal, moderate right and severe left neural foraminal narrowing at the C6-7 level. Moderate left C4-5 neural foraminal narrowing. Mild spinal canal narrowing at the C2-3, C4-5, C5-6 levels. Electronically Signed   By: Primitivo Gauze M.D.   On: 01/25/2021 09:30   MR LUMBAR SPINE WO CONTRAST  Result Date: 01/25/2021 CLINICAL DATA:  Lower extremity pain, weakness.  Prior falls. EXAM: MRI LUMBAR SPINE WITHOUT CONTRAST TECHNIQUE: Multiplanar, multisequence MR imaging of the lumbar spine was performed. No intravenous contrast was administered. COMPARISON:  12/30/2020 and  prior. FINDINGS: Please note some image sequences are mildly degraded by motion artifact. Segmentation: The lowest well-formed intervertebral disc space is assumed to be the L5-S1 level. Alignment: Straightening of lordosis. Trace L2-3, L5-S1 retrolisthesis. Vertebrae: Modic type 2 endplate degenerative changes and mild bone marrow heterogeneity. No fracture or aggressive osseous lesion. Conus medullaris and cauda equina: Conus extends to the L1 level. Conus and cauda equina appear normal. Disc levels: Multilevel desiccation and disc space loss. L1-2: No significant disc bulge. Bilateral facet degenerative spurring. Patent spinal canal and neural foramen. L2-3: Disc bulge and bilateral facet degenerative spurring. Superimposed inferiorly oriented right paracentral protrusion. Mild-to-moderate spinal canal, moderate right and mild left neural foraminal narrowing. L3-4: Minimal disc bulge and bilateral facet degenerative spurring. Mild spinal canal, moderate right and mild left neural foraminal narrowing. L4-5: Disc bulge with superimposed left extraforaminal protrusion. Bilateral facet degenerative spurring and ligamentum flavum thickening. Mild spinal canal and bilateral neural foraminal narrowing. L5-S1: Disc bulge with superimposed central protrusion abutting the descending S1 nerve roots. Bilateral facet hypertrophy. Patent spinal canal. Mild to moderate  bilateral neural foraminal narrowing. Paraspinal and other soft tissues: Bilateral renal cysts. IMPRESSION: Mild to moderate L2-3 and mild L3-5 spinal canal narrowing. Moderate right L2-4 and bilateral L5-S1 neural foraminal narrowing. Otherwise mild neural foraminal narrowing at the L2-L5 levels. Electronically Signed   By: Primitivo Gauze M.D.   On: 01/25/2021 09:40   DG CHEST PORT 1 VIEW  Result Date: 02/21/2021 CLINICAL DATA:  Hypoxia. EXAM: PORTABLE CHEST 1 VIEW COMPARISON:  Radiograph yesterday.  Most recent CT 12/21/2020 FINDINGS: Similar low lung  volumes to prior exam. Stable cardiomegaly. Unchanged mediastinal contours with aortic atherosclerosis. Small left pleural effusion with adjacent basilar airspace disease or atelectasis. Small right pleural effusion. Improved pulmonary edema. Persistent vascular congestion. No pneumothorax. IMPRESSION: 1. Improving pulmonary edema. Persistent vascular congestion and cardiomegaly. 2. Bilateral pleural effusions with adjacent basilar airspace disease/atelectasis, left greater than right. Electronically Signed   By: Keith Rake M.D.   On: 02/21/2021 15:35   DG Chest Portable 1 View  Result Date: 02/20/2021 CLINICAL DATA:  Altered mental status, hypotension EXAM: PORTABLE CHEST 1 VIEW COMPARISON:  02/11/2021 FINDINGS: Cardiomegaly. Atherosclerotic calcification of the aortic knob. Low lung volumes. Pulmonary vascular congestion with bilateral perihilar interstitial opacities. No large pleural fluid collection. No pneumothorax is seen. IMPRESSION: Findings suggest CHF with pulmonary edema. A superimposed infectious process would be difficult to exclude. Electronically Signed   By: Davina Poke D.O.   On: 02/20/2021 20:34   DG Chest Portable 1 View  Result Date: 02/11/2021 CLINICAL DATA:  Foot and ankle edema. EXAM: PORTABLE CHEST 1 VIEW COMPARISON:  November 23, 2011 FINDINGS: Increased cardiac silhouette. Calcific atherosclerotic disease of the aorta. Diffuse bilateral patchy and linear airspace opacities with central predominance. No pneumothorax.  Small left pleural effusion cannot be excluded. IMPRESSION: 1. Diffuse bilateral patchy and linear airspace opacities with central predominance may represent mixed pattern pulmonary edema or less likely multifocal pneumonia. 2. Small left pleural effusion cannot be excluded. Electronically Signed   By: Fidela Salisbury M.D.   On: 02/11/2021 14:20   VAS [place] LOWER EXTREMITY VENOUS (DVT)  Result Date: 02/21/2021  Lower Venous DVT Study Patient Name:  [name] [name]  Date of Exam:   02/21/2021 Medical Rec #: [id number]      Accession #:    [id number] Date of Birth: 1933-03-01     Patient Gender: M Patient Age:   87Y Exam Location:  [facility] Procedure:      VAS [place] LOWER EXTREMITY VENOUS (DVT) Referring Phys: [id number] AMRIT ADHIKARI --------------------------------------------------------------------------------  Indications: Swelling.  Risk Factors: None identified. Limitations: Body habitus, poor ultrasound/tissue interface and patient positioning, patient immobility. Comparison Study: No prior studies. Performing Technologist: Oliver Hum RVT  Examination Guidelines: A complete evaluation includes B-mode imaging, spectral Doppler, color Doppler, and power Doppler as needed of all accessible portions of each vessel. Bilateral testing is considered an integral part of a complete examination. Limited examinations for reoccurring indications may be performed as noted. The reflux portion of the exam is performed with the patient in reverse Trendelenburg.  +---------+---------------+---------+-----------+----------+-------------------+ RIGHT    CompressibilityPhasicitySpontaneityPropertiesThrombus Aging      +---------+---------------+---------+-----------+----------+-------------------+ CFV      Full           Yes      Yes                                      +---------+---------------+---------+-----------+----------+-------------------+ SFJ  Full                                                             +---------+---------------+---------+-----------+----------+-------------------+ FV Prox  Full                                                             +---------+---------------+---------+-----------+----------+-------------------+ FV Mid   Full                                                             +---------+---------------+---------+-----------+----------+-------------------+ FV DistalFull                                                              +---------+---------------+---------+-----------+----------+-------------------+ PFV      Full                                                             +---------+---------------+---------+-----------+----------+-------------------+ POP      Full           Yes      Yes                                      +---------+---------------+---------+-----------+----------+-------------------+ PTV      Full                                                             +---------+---------------+---------+-----------+----------+-------------------+ PERO                                                  Not well visualized +---------+---------------+---------+-----------+----------+-------------------+   +---------+---------------+---------+-----------+----------+-------------------+ LEFT     CompressibilityPhasicitySpontaneityPropertiesThrombus Aging      +---------+---------------+---------+-----------+----------+-------------------+ CFV      Full           Yes      Yes                                      +---------+---------------+---------+-----------+----------+-------------------+ SFJ      Full                                                             +---------+---------------+---------+-----------+----------+-------------------+  FV Prox  Full                                                             +---------+---------------+---------+-----------+----------+-------------------+ FV Mid   Full                                                             +---------+---------------+---------+-----------+----------+-------------------+ FV DistalFull                                                             +---------+---------------+---------+-----------+----------+-------------------+ PFV      Full                                                              +---------+---------------+---------+-----------+----------+-------------------+ POP      Full           Yes      Yes                                      +---------+---------------+---------+-----------+----------+-------------------+ PTV      Full                                                             +---------+---------------+---------+-----------+----------+-------------------+ PERO                                                  Not well visualized +---------+---------------+---------+-----------+----------+-------------------+     Summary: RIGHT: - There is no evidence of deep vein thrombosis in the lower extremity. However, portions of this examination were limited- see technologist comments above.  - No cystic structure found in the popliteal fossa.  LEFT: - There is no evidence of deep vein thrombosis in the lower extremity. However, portions of this examination were limited- see technologist comments above.  - No cystic structure found in the popliteal fossa.  *See table(s) above for measurements and observations. Electronically signed by Ruta Hinds MD on 02/21/2021 at 4:28:57 PM.    Final      Subjective: No acute events ON.   Discharge Exam: Vitals:   02/23/21 0758 02/23/21 0800  BP:    Pulse:  67  Resp:  13  Temp: (!) 96.6 F (35.9 C) (!) 96.6 F (35.9 C)  SpO2:  98%   Vitals:  02/23/21 0300 02/23/21 0616 02/23/21 0758 02/23/21 0800  BP:  (!) 147/78    Pulse:  77  67  Resp:  15  13  Temp: 97.6 F (36.4 C)  (!) 96.6 F (35.9 C) (!) 96.6 F (35.9 C)  TempSrc: Axillary  Axillary Axillary  SpO2:  100%  98%  Weight:        General: 87 y.o. male resting in bed in NAD Cardiovascular: RRR, +S1, S2, no m/g/r, equal pulses throughout Respiratory: CTABL, no w/r/r, normal WOB GI: BS+, NDNT, no masses noted, no organomegaly noted MSK: No e/c/c Neuro: nods "yes/no", follows limited commands   The results of significant diagnostics from this  hospitalization (including imaging, microbiology, ancillary and laboratory) are listed below for reference.     Microbiology: Recent Results (from the past 240 hour(s))  Surgical pcr screen     Status: None   Collection Time: 02/20/21 11:28 AM   Specimen: Nasal Mucosa; Nasal Swab  Result Value Ref Range Status   MRSA, PCR NEGATIVE NEGATIVE Final   Staphylococcus aureus NEGATIVE NEGATIVE Final    Comment: (NOTE) The Xpert SA Assay (FDA approved for NASAL specimens in patients 22 years of age and older), is one component of a comprehensive surveillance program. It is not intended to diagnose infection nor to guide or monitor treatment. Performed at [facility], [place] [address] [place], [place] [address]   SARS CORONAVIRUS 2 (TAT 6-24 HRS) Nasopharyngeal Nasopharyngeal Swab     Status: None   Collection Time: 02/20/21 11:29 AM   Specimen: Nasopharyngeal Swab  Result Value Ref Range Status   SARS Coronavirus 2 NEGATIVE NEGATIVE Final    Comment: (NOTE) SARS-CoV-2 target nucleic acids are NOT DETECTED.  The SARS-CoV-2 RNA is generally detectable in upper and lower respiratory specimens during the acute phase of infection. Negative results do not preclude SARS-CoV-2 infection, do not rule out co-infections with other pathogens, and should not be used as the sole basis for treatment or other patient management decisions. Negative results must be combined with clinical observations, patient history, and epidemiological information. The expected result is Negative.  Fact Sheet for Patients: SugarRoll.be  Fact Sheet for Healthcare Providers: https://www.woods-mathews.com/  This test is not yet approved or cleared by the [place] FDA and  has been authorized for detection and/or diagnosis of SARS-CoV-2 by FDA under an Emergency Use Authorization (EUA). This EUA will remain  in effect (meaning this test can be used) for the duration  of the COVID-19 declaration under Se ction 564(b)(1) of the Act, 21 U.S.C. section 360bbb-3(b)(1), unless the authorization is terminated or revoked sooner.  Performed at [facility], [place] [address], [place], [place] [address]   Culture, blood (routine x 2)     Status: Abnormal   Collection Time: 02/20/21  7:31 PM   Specimen: BLOOD  Result Value Ref Range Status   Specimen Description   Final    BLOOD LEFT ANTECUBITAL Performed at [facility] [address], [place], [place] [address]    Special Requests   Final    BOTTLES DRAWN AEROBIC AND ANAEROBIC Blood Culture adequate volume Performed at [facility] [address], [place], [place] [address]    Culture  Setup Time   Final    GRAM POSITIVE COCCI IN CLUSTERS IN BOTH AEROBIC AND ANAEROBIC BOTTLES CRITICAL RESULT CALLED TO, READ BACK BY AND VERIFIED WITH: [place] AT 2018 02/21/2021 BY L BENFIELD  Culture (A)  Final    STAPHYLOCOCCUS EPIDERMIDIS THE SIGNIFICANCE OF ISOLATING THIS ORGANISM FROM A SINGLE SET OF BLOOD CULTURES WHEN MULTIPLE SETS ARE DRAWN IS UNCERTAIN. PLEASE NOTIFY THE MICROBIOLOGY DEPARTMENT WITHIN ONE WEEK IF SPECIATION AND SENSITIVITIES ARE REQUIRED. Performed at [facility], [place] [address], [place], [place] [address]    Report Status 02/23/2021 FINAL  Final  Blood Culture ID Panel (Reflexed)     Status: Abnormal   Collection Time: 02/20/21  7:31 PM  Result Value Ref Range Status   Enterococcus faecalis NOT DETECTED NOT DETECTED Final   Enterococcus Faecium NOT DETECTED NOT DETECTED Final   Listeria monocytogenes NOT DETECTED NOT DETECTED Final   Staphylococcus species DETECTED (A) NOT DETECTED Final    Comment: CRITICAL RESULT CALLED TO, READ BACK BY AND VERIFIED WITH: PHARMD J GADHIA AT 2018 02/21/2021 BY L BENFIELD    Staphylococcus aureus (BCID) NOT DETECTED NOT DETECTED Final   Staphylococcus epidermidis DETECTED (A) NOT DETECTED Final     Comment: CRITICAL RESULT CALLED TO, READ BACK BY AND VERIFIED WITH: PHARMD J GADHIA AT 2018 02/21/2021 BY L BENFIELD    Staphylococcus lugdunensis NOT DETECTED NOT DETECTED Final   Streptococcus species NOT DETECTED NOT DETECTED Final   Streptococcus agalactiae NOT DETECTED NOT DETECTED Final   Streptococcus pneumoniae NOT DETECTED NOT DETECTED Final   Streptococcus pyogenes NOT DETECTED NOT DETECTED Final   A.calcoaceticus-baumannii NOT DETECTED NOT DETECTED Final   Bacteroides fragilis NOT DETECTED NOT DETECTED Final   Enterobacterales NOT DETECTED NOT DETECTED Final   Enterobacter cloacae complex NOT DETECTED NOT DETECTED Final   Escherichia coli NOT DETECTED NOT DETECTED Final   Klebsiella aerogenes NOT DETECTED NOT DETECTED Final   Klebsiella oxytoca NOT DETECTED NOT DETECTED Final   Klebsiella pneumoniae NOT DETECTED NOT DETECTED Final   Proteus species NOT DETECTED NOT DETECTED Final   Salmonella species NOT DETECTED NOT DETECTED Final   Serratia marcescens NOT DETECTED NOT DETECTED Final   Haemophilus influenzae NOT DETECTED NOT DETECTED Final   Neisseria meningitidis NOT DETECTED NOT DETECTED Final   Pseudomonas aeruginosa NOT DETECTED NOT DETECTED Final   Stenotrophomonas maltophilia NOT DETECTED NOT DETECTED Final   Candida albicans NOT DETECTED NOT DETECTED Final   Candida auris NOT DETECTED NOT DETECTED Final   Candida glabrata NOT DETECTED NOT DETECTED Final   Candida krusei NOT DETECTED NOT DETECTED Final   Candida parapsilosis NOT DETECTED NOT DETECTED Final   Candida tropicalis NOT DETECTED NOT DETECTED Final   Cryptococcus neoformans/gattii NOT DETECTED NOT DETECTED Final   Methicillin resistance mecA/C NOT DETECTED NOT DETECTED Final    Comment: Performed at [facility], [place] [place],  [address]  Resp Panel by RT-PCR (Flu A&B, Covid) Nasopharyngeal Swab     Status: None   Collection Time: 02/20/21  8:19 PM   Specimen: Nasopharyngeal Swab;  Nasopharyngeal(NP) swabs in vial transport medium  Result Value Ref Range Status   SARS Coronavirus 2 by RT PCR NEGATIVE NEGATIVE Final    Comment: (NOTE) SARS-CoV-2 target nucleic acids are NOT DETECTED.  The SARS-CoV-2 RNA is generally detectable in upper respiratory specimens during the acute phase of infection. The lowest concentration of SARS-CoV-2 viral copies this assay can detect is 138 copies/mL. A negative result does not preclude SARS-Cov-2 infection and should not be used as the sole basis for treatment or other patient management decisions. A negative result may occur with  improper specimen collection/handling, submission of specimen other than nasopharyngeal  swab, presence of viral mutation(s) within the areas targeted by this assay, and inadequate number of viral copies(<138 copies/mL). A negative result must be combined with clinical observations, patient history, and epidemiological information. The expected result is Negative.  Fact Sheet for Patients:  EntrepreneurPulse.com.au  Fact Sheet for Healthcare Providers:  IncredibleEmployment.be  This test is no t yet approved or cleared by the [place] FDA and  has been authorized for detection and/or diagnosis of SARS-CoV-2 by FDA under an Emergency Use Authorization (EUA). This EUA will remain  in effect (meaning this test can be used) for the duration of the COVID-19 declaration under Section 564(b)(1) of the Act, 21 U.S.C.section 360bbb-3(b)(1), unless the authorization is terminated  or revoked sooner.       Influenza A by PCR NEGATIVE NEGATIVE Final   Influenza B by PCR NEGATIVE NEGATIVE Final    Comment: (NOTE) The Xpert Xpress SARS-CoV-2/FLU/RSV plus assay is intended as an aid in the diagnosis of influenza from Nasopharyngeal swab specimens and should not be used as a sole basis for treatment. Nasal washings and aspirates are unacceptable for Xpert Xpress  SARS-CoV-2/FLU/RSV testing.  Fact Sheet for Patients: EntrepreneurPulse.com.au  Fact Sheet for Healthcare Providers: IncredibleEmployment.be  This test is not yet approved or cleared by the [place] FDA and has been authorized for detection and/or diagnosis of SARS-CoV-2 by FDA under an Emergency Use Authorization (EUA). This EUA will remain in effect (meaning this test can be used) for the duration of the COVID-19 declaration under Section 564(b)(1) of the Act, 21 U.S.C. section 360bbb-3(b)(1), unless the authorization is terminated or revoked.  Performed at [facility], [place] [address], [place], [place] [address]   Urine culture     Status: None   Collection Time: 02/20/21  8:30 PM   Specimen: Urine, Clean Catch  Result Value Ref Range Status   Specimen Description   Final    URINE, CLEAN CATCH Performed at [facility], [place] [address], [place], [place] [address]    Special Requests   Final    NONE Performed at [facility], [place] [address] [place], [place] [address]    Culture   Final    NO GROWTH Performed at [facility], [place] [address] [place], [place] [address]    Report Status 02/22/2021 FINAL  Final  MRSA PCR Screening     Status: None   Collection Time: 02/21/21  2:28 PM   Specimen: Nasopharyngeal  Result Value Ref Range Status   MRSA by PCR NEGATIVE NEGATIVE Final    Comment:        The GeneXpert MRSA Assay (FDA approved for NASAL specimens only), is one component of a comprehensive MRSA colonization surveillance program. It is not intended to diagnose MRSA infection nor to guide or monitor treatment for MRSA infections. Performed at [facility], [place] [address], [place], [place] [address]   Culture, blood (routine x 2)     Status: None (Preliminary result)   Collection Time: 02/22/21  8:30 AM   Specimen: BLOOD  Result Value Ref  Range Status   Specimen Description   Final    BLOOD LEFT ANTECUBITAL Performed at [facility] [address], [place], [place] [address]    Special Requests   Final    BOTTLES DRAWN AEROBIC ONLY Blood Culture adequate volume Performed at [facility] [address], [place], [place] [address]    Culture  Final    NO GROWTH < 24 HOURS Performed at [facility], [place] [address] [place], [place] [address]    Report Status PENDING  Incomplete  Culture, blood (routine x 2)     Status: None (Preliminary result)   Collection Time: 02/22/21  8:30 AM   Specimen: BLOOD  Result Value Ref Range Status   Specimen Description   Final    BLOOD LEFT HAND Performed at [place] [place] [address], [place], [place] [address]    Special Requests   Final    BOTTLES DRAWN AEROBIC AND ANAEROBIC Blood Culture results may not be optimal due to an inadequate volume of blood received in culture bottles Performed at [facility] [address] [place], [place] [address]    Culture   Final    NO GROWTH < 24 HOURS Performed at [place] [address] [place], [place] [address]    Report Status PENDING  Incomplete     Labs: BNP (last 3 results) Recent Labs    12/21/20 1203  BNP 295.6*   Basic Metabolic Panel: Recent Labs  Lab 02/20/21 1126 02/20/21 1931 02/21/21 0439 02/21/21 1537 02/22/21 0248  NA 147* 147* 151* 151* 152*  K 4.1 4.0 4.7 4.1 4.1  CL 113* 116* 116* 121* 121*  CO2 23 25 23 24  20*  GLUCOSE 85 157* 66* 93 108*  BUN 69* 73* 74* 73* 78*  CREATININE 3.97* 3.82* 3.85* 4.31* 4.37*  CALCIUM 10.7* 10.2 9.5 9.8 10.0   Liver Function Tests: Recent Labs  Lab 02/20/21 1126 02/20/21 1931 02/21/21 1537  AST 17 18 13*  ALT 21 20 17   ALKPHOS 86 79 83  BILITOT 1.0 0.8 0.8  PROT 6.2* 6.1* 6.0*  ALBUMIN 3.3* 3.4* 3.2*   No results for input(s): LIPASE, AMYLASE in the last 168  hours. Recent Labs  Lab 02/21/21 0519  AMMONIA 13   CBC: Recent Labs  Lab 02/20/21 1126 02/20/21 1931 02/21/21 0439 02/21/21 1537 02/22/21 0248  WBC 5.3 5.2 6.0 6.3 7.6  NEUTROABS 3.2 3.1  --   --  5.2  HGB 9.4* 8.9* 8.6* 8.9* 8.8*  HCT 30.8* 28.6* 27.9* 29.2* 29.4*  MCV 98.1 97.6 97.9 99.7 100.3*  PLT 57* 65* 58* 58* 51*   Cardiac Enzymes: No results for input(s): CKTOTAL, CKMB, CKMBINDEX, TROPONINI in the last 168 hours. BNP: Invalid input(s): POCBNP CBG: Recent Labs  Lab 02/20/21 1934 02/21/21 1410 02/21/21 1416 02/21/21 1921  GLUCAP 158* 88 87 102*   D-Dimer No results for input(s): DDIMER in the last 72 hours. Hgb A1c No results for input(s): HGBA1C in the last 72 hours. Lipid Profile No results for input(s): CHOL, HDL, LDLCALC, TRIG, CHOLHDL, LDLDIRECT in the last 72 hours. Thyroid function studies Recent Labs    02/21/21 0439 02/22/21 0830  TSH 6.355*  --   T3FREE  --  1.6*   Anemia work up Recent Labs    02/22/21 0830  VITAMINB12 1,402*   Urinalysis    Component Value Date/Time   COLORURINE YELLOW 02/20/2021 [place] 02/20/2021 2030   [place] 1.013 02/20/2021 2030   [place] 5.0 02/20/2021 2030   GLUCOSEU NEGATIVE 02/20/2021 2030   HGBUR NEGATIVE 02/20/2021 2030   [place] NEGATIVE 02/20/2021 2030   [place] 02/20/2021 2030   PROTEINUR 30 (A) 02/20/2021 2030   UROBILINOGEN 0.2 11/23/2011 1335   NITRITE NEGATIVE 02/20/2021 2030   [place] 02/20/2021 2030  Sepsis Labs Invalid input(s): PROCALCITONIN,  WBC,  LACTICIDVEN Microbiology Recent Results (from the past 240 hour(s))  Surgical pcr screen     Status: None   Collection Time: 02/20/21 11:28 AM   Specimen: Nasal Mucosa; Nasal Swab  Result Value Ref Range Status   MRSA, PCR NEGATIVE NEGATIVE Final   Staphylococcus aureus NEGATIVE NEGATIVE Final    Comment: (NOTE) The Xpert SA Assay (FDA approved for NASAL specimens in patients 38 years  of age and older), is one component of a comprehensive surveillance program. It is not intended to diagnose infection nor to guide or monitor treatment. Performed at [facility], [place] [address] [place], [place] [address]   SARS CORONAVIRUS 2 (TAT 6-24 HRS) Nasopharyngeal Nasopharyngeal Swab     Status: None   Collection Time: 02/20/21 11:29 AM   Specimen: Nasopharyngeal Swab  Result Value Ref Range Status   SARS Coronavirus 2 NEGATIVE NEGATIVE Final    Comment: (NOTE) SARS-CoV-2 target nucleic acids are NOT DETECTED.  The SARS-CoV-2 RNA is generally detectable in upper and lower respiratory specimens during the acute phase of infection. Negative results do not preclude SARS-CoV-2 infection, do not rule out co-infections with other pathogens, and should not be used as the sole basis for treatment or other patient management decisions. Negative results must be combined with clinical observations, patient history, and epidemiological information. The expected result is Negative.  Fact Sheet for Patients: SugarRoll.be  Fact Sheet for Healthcare Providers: https://www.woods-mathews.com/  This test is not yet approved or cleared by the [place] FDA and  has been authorized for detection and/or diagnosis of SARS-CoV-2 by FDA under an Emergency Use Authorization (EUA). This EUA will remain  in effect (meaning this test can be used) for the duration of the COVID-19 declaration under Se ction 564(b)(1) of the Act, 21 U.S.C. section 360bbb-3(b)(1), unless the authorization is terminated or revoked sooner.  Performed at [facility], [place] [address] [place], [place] [address]   Culture, blood (routine x 2)     Status: Abnormal   Collection Time: 02/20/21  7:31 PM   Specimen: BLOOD  Result Value Ref Range Status   Specimen Description   Final    BLOOD LEFT ANTECUBITAL Performed at [facility]  [address] [place], [place] [address]    Special Requests   Final    BOTTLES DRAWN AEROBIC AND ANAEROBIC Blood Culture adequate volume Performed at [facility] [address], [place], [place] [address]    Culture  Setup Time   Final    GRAM POSITIVE COCCI IN CLUSTERS IN BOTH AEROBIC AND ANAEROBIC BOTTLES CRITICAL RESULT CALLED TO, READ BACK BY AND VERIFIED WITH: PHARMD J GADHIA AT 2018 02/21/2021 BY L BENFIELD    Culture (A)  Final    STAPHYLOCOCCUS EPIDERMIDIS THE SIGNIFICANCE OF ISOLATING THIS ORGANISM FROM A SINGLE SET OF BLOOD CULTURES WHEN MULTIPLE SETS ARE DRAWN IS UNCERTAIN. PLEASE NOTIFY THE MICROBIOLOGY DEPARTMENT WITHIN ONE WEEK IF SPECIATION AND SENSITIVITIES ARE REQUIRED. Performed at [facility], [place] [address] [place],  [address]    Report Status 02/23/2021 FINAL  Final  Blood Culture ID Panel (Reflexed)     Status: Abnormal   Collection Time: 02/20/21  7:31 PM  Result Value Ref Range Status   Enterococcus faecalis NOT DETECTED NOT DETECTED Final   Enterococcus Faecium NOT DETECTED NOT DETECTED Final   Listeria monocytogenes NOT DETECTED NOT DETECTED Final   Staphylococcus species DETECTED (  A) NOT DETECTED Final    Comment: CRITICAL RESULT CALLED TO, READ BACK BY AND VERIFIED WITH: PHARMD J GADHIA AT 2018 02/21/2021 BY L BENFIELD    Staphylococcus aureus (BCID) NOT DETECTED NOT DETECTED Final   Staphylococcus epidermidis DETECTED (A) NOT DETECTED Final    Comment: CRITICAL RESULT CALLED TO, READ BACK BY AND VERIFIED WITH: PHARMD J GADHIA AT 2018 02/21/2021 BY L BENFIELD    Staphylococcus lugdunensis NOT DETECTED NOT DETECTED Final   Streptococcus species NOT DETECTED NOT DETECTED Final   Streptococcus agalactiae NOT DETECTED NOT DETECTED Final   Streptococcus pneumoniae NOT DETECTED NOT DETECTED Final   Streptococcus pyogenes NOT DETECTED NOT DETECTED Final   A.calcoaceticus-baumannii NOT DETECTED NOT DETECTED Final   Bacteroides  fragilis NOT DETECTED NOT DETECTED Final   Enterobacterales NOT DETECTED NOT DETECTED Final   Enterobacter cloacae complex NOT DETECTED NOT DETECTED Final   Escherichia coli NOT DETECTED NOT DETECTED Final   Klebsiella aerogenes NOT DETECTED NOT DETECTED Final   Klebsiella oxytoca NOT DETECTED NOT DETECTED Final   Klebsiella pneumoniae NOT DETECTED NOT DETECTED Final   Proteus species NOT DETECTED NOT DETECTED Final   Salmonella species NOT DETECTED NOT DETECTED Final   Serratia marcescens NOT DETECTED NOT DETECTED Final   Haemophilus influenzae NOT DETECTED NOT DETECTED Final   Neisseria meningitidis NOT DETECTED NOT DETECTED Final   Pseudomonas aeruginosa NOT DETECTED NOT DETECTED Final   Stenotrophomonas maltophilia NOT DETECTED NOT DETECTED Final   Candida albicans NOT DETECTED NOT DETECTED Final   Candida auris NOT DETECTED NOT DETECTED Final   Candida glabrata NOT DETECTED NOT DETECTED Final   Candida krusei NOT DETECTED NOT DETECTED Final   Candida parapsilosis NOT DETECTED NOT DETECTED Final   Candida tropicalis NOT DETECTED NOT DETECTED Final   Cryptococcus neoformans/gattii NOT DETECTED NOT DETECTED Final   Methicillin resistance mecA/C NOT DETECTED NOT DETECTED Final    Comment: Performed at [facility], [address], [place], [place] [address]  Resp Panel by RT-PCR (Flu A&B, Covid) Nasopharyngeal Swab     Status: None   Collection Time: 02/20/21  8:19 PM   Specimen: Nasopharyngeal Swab; Nasopharyngeal(NP) swabs in vial transport medium  Result Value Ref Range Status   SARS Coronavirus 2 by RT PCR NEGATIVE NEGATIVE Final    Comment: (NOTE) SARS-CoV-2 target nucleic acids are NOT DETECTED.  The SARS-CoV-2 RNA is generally detectable in upper respiratory specimens during the acute phase of infection. The lowest concentration of SARS-CoV-2 viral copies this assay can detect is 138 copies/mL. A negative result does not preclude SARS-Cov-2 infection and should not be  used as the sole basis for treatment or other patient management decisions. A negative result may occur with  improper specimen collection/handling, submission of specimen other than nasopharyngeal swab, presence of viral mutation(s) within the areas targeted by this assay, and inadequate number of viral copies(<138 copies/mL). A negative result must be combined with clinical observations, patient history, and epidemiological information. The expected result is Negative.  Fact Sheet for Patients:  EntrepreneurPulse.com.au  Fact Sheet for Healthcare Providers:  IncredibleEmployment.be  This test is no t yet approved or cleared by the [place] FDA and  has been authorized for detection and/or diagnosis of SARS-CoV-2 by FDA under an Emergency Use Authorization (EUA). This EUA will remain  in effect (meaning this test can be used) for the duration of the COVID-19 declaration under Section 564(b)(1) of the Act, 21 U.S.C.section 360bbb-3(b)(1), unless the authorization is terminated  or  revoked sooner.       Influenza A by PCR NEGATIVE NEGATIVE Final   Influenza B by PCR NEGATIVE NEGATIVE Final    Comment: (NOTE) The Xpert Xpress SARS-CoV-2/FLU/RSV plus assay is intended as an aid in the diagnosis of influenza from Nasopharyngeal swab specimens and should not be used as a sole basis for treatment. Nasal washings and aspirates are unacceptable for Xpert Xpress SARS-CoV-2/FLU/RSV testing.  Fact Sheet for Patients: EntrepreneurPulse.com.au  Fact Sheet for Healthcare Providers: IncredibleEmployment.be  This test is not yet approved or cleared by the [place] FDA and has been authorized for detection and/or diagnosis of SARS-CoV-2 by FDA under an Emergency Use Authorization (EUA). This EUA will remain in effect (meaning this test can be used) for the duration of the COVID-19 declaration under Section  564(b)(1) of the Act, 21 U.S.C. section 360bbb-3(b)(1), unless the authorization is terminated or revoked.  Performed at [facility], [place] [address], [place], [place] [address]   Urine culture     Status: None   Collection Time: 02/20/21  8:30 PM   Specimen: Urine, Clean Catch  Result Value Ref Range Status   Specimen Description   Final    URINE, CLEAN CATCH Performed at [facility], [place] [address], [place], [place] [address]    Special Requests   Final    NONE Performed at [facility], [place] [address] [place], [place] [address]    Culture   Final    NO GROWTH Performed at [facility], [place] [address], [place], [place] [address]    Report Status 02/22/2021 FINAL  Final  MRSA PCR Screening     Status: None   Collection Time: 02/21/21  2:28 PM   Specimen: Nasopharyngeal  Result Value Ref Range Status   MRSA by PCR NEGATIVE NEGATIVE Final    Comment:        The GeneXpert MRSA Assay (FDA approved for NASAL specimens only), is one component of a comprehensive MRSA colonization surveillance program. It is not intended to diagnose MRSA infection nor to guide or monitor treatment for MRSA infections. Performed at [facility], [place] [address], [place], [place] [address]   Culture, blood (routine x 2)     Status: None (Preliminary result)   Collection Time: 02/22/21  8:30 AM   Specimen: BLOOD  Result Value Ref Range Status   Specimen Description   Final    BLOOD LEFT ANTECUBITAL Performed at [place] [address] [place], [place] [address]    Special Requests   Final    BOTTLES DRAWN AEROBIC ONLY Blood Culture adequate volume Performed at [place] [address], [place], [place] [address]    Culture   Final    NO GROWTH < 24 HOURS Performed at [facility] [address] [place], [place] [address]    Report Status PENDING   Incomplete  Culture, blood (routine x 2)     Status: None (Preliminary result)   Collection Time: 02/22/21  8:30 AM   Specimen: BLOOD  Result Value Ref Range Status   Specimen Description   Final    BLOOD LEFT HAND Performed at [place] [address], [place], [place] [address]    Special Requests   Final    BOTTLES DRAWN AEROBIC AND ANAEROBIC Blood Culture results may not be optimal due to an inadequate volume of blood received in culture bottles Performed  at [facility], [place] [address] [place], [place] [address]    Culture   Final    NO GROWTH < 24 HOURS Performed at [facility] [facility] [address], [place], [place] [address]    Report Status PENDING  Incomplete     Time coordinating discharge: 35 minutes  SIGNED:   Jonnie Finner, DO  [facility] 02/23/2021, 12:03 PM   If 7PM-7AM, please contact night-coverage [email or web address]

## 2021-02-23 NOTE — TOC Progression Note (Addendum)
Transition of Care [facility]) - Progression Note    Patient Details  Name: [name] [name] MRN: [id number] Date of Birth: 07/10/1933  Transition [facility] CM/SW Contact  Leeroy Cha, RN Phone Number: 02/23/2021, 8:35 AM  Clinical Narrative:    Per [name] patient doew [facility]. Getting consents signed ac dc tcfMisty Nyoka [facility]den, papers have been signed and patient can go too [employer] [facility] [employer] this am. Tct-ptar, for transport called at 1230. Expected Discharge Plan: [place] Barriers to Discharge: No Barriers Identified  Expected Discharge Plan and Services Expected Discharge Plan: [place]   Discharge Planning Services: CM Consult Post Acute Care Choice: [place] Living arrangements for the past 2 months: [facility]                                       Social Determinants of Health (SDOH) Interventions    Readmission Risk Interventions No flowsheet data found.

## 2021-02-24 LAB — ADAMTS13 ACTIVITY REFLEX

## 2021-02-24 LAB — ADAMTS13 ACTIVITY: Adamts 13 Activity: 64.3 % — ABNORMAL LOW (ref >66.8)

## 2021-02-27 LAB — CULTURE, BLOOD (ROUTINE X 2)
Culture: NO GROWTH
Culture: NO GROWTH
Special Requests: ADEQUATE

## 2021-03-17 DEATH — deceased
